# Patient Record
Sex: Female | Born: 1950
Health system: Southern US, Community
[De-identification: ages and names within clinical notes are randomized; demographics above are authoritative.]

## PROBLEM LIST (undated history)

## (undated) DIAGNOSIS — E559 Vitamin D deficiency, unspecified: Secondary | ICD-10-CM

## (undated) DIAGNOSIS — Z87898 Personal history of other specified conditions: Secondary | ICD-10-CM

## (undated) DIAGNOSIS — E785 Hyperlipidemia, unspecified: Secondary | ICD-10-CM

## (undated) DIAGNOSIS — Z8669 Personal history of other diseases of the nervous system and sense organs: Secondary | ICD-10-CM

## (undated) DIAGNOSIS — M79609 Pain in unspecified limb: Secondary | ICD-10-CM

## (undated) DIAGNOSIS — G500 Trigeminal neuralgia: Secondary | ICD-10-CM

## (undated) DIAGNOSIS — F329 Major depressive disorder, single episode, unspecified: Secondary | ICD-10-CM

## (undated) DIAGNOSIS — F411 Generalized anxiety disorder: Secondary | ICD-10-CM

## (undated) DIAGNOSIS — J42 Unspecified chronic bronchitis: Secondary | ICD-10-CM

## (undated) DIAGNOSIS — J309 Allergic rhinitis, unspecified: Secondary | ICD-10-CM

## (undated) DIAGNOSIS — M531 Cervicobrachial syndrome: Secondary | ICD-10-CM

## (undated) DIAGNOSIS — K449 Diaphragmatic hernia without obstruction or gangrene: Secondary | ICD-10-CM

## (undated) DIAGNOSIS — R51 Headache: Secondary | ICD-10-CM

## (undated) DIAGNOSIS — M1990 Unspecified osteoarthritis, unspecified site: Secondary | ICD-10-CM

## (undated) DIAGNOSIS — L259 Unspecified contact dermatitis, unspecified cause: Secondary | ICD-10-CM

## (undated) DIAGNOSIS — G9050 Complex regional pain syndrome I, unspecified: Secondary | ICD-10-CM

## (undated) DIAGNOSIS — Z86718 Personal history of other venous thrombosis and embolism: Secondary | ICD-10-CM

## (undated) DIAGNOSIS — K219 Gastro-esophageal reflux disease without esophagitis: Secondary | ICD-10-CM

## (undated) DIAGNOSIS — M546 Pain in thoracic spine: Secondary | ICD-10-CM

## (undated) DIAGNOSIS — D126 Benign neoplasm of colon, unspecified: Secondary | ICD-10-CM

## (undated) DIAGNOSIS — J449 Chronic obstructive pulmonary disease, unspecified: Secondary | ICD-10-CM

## (undated) DIAGNOSIS — G508 Other disorders of trigeminal nerve: Secondary | ICD-10-CM

## (undated) DIAGNOSIS — J441 Chronic obstructive pulmonary disease with (acute) exacerbation: Secondary | ICD-10-CM

## (undated) DIAGNOSIS — IMO0001 Reserved for inherently not codable concepts without codable children: Secondary | ICD-10-CM

## (undated) HISTORY — DX: Headache: R51

## (undated) HISTORY — DX: Personal history of other venous thrombosis and embolism: Z86.718

## (undated) HISTORY — DX: Diaphragmatic hernia without obstruction or gangrene: K44.9

## (undated) HISTORY — DX: Hyperlipidemia, unspecified: E78.5

## (undated) HISTORY — DX: Vitamin D deficiency, unspecified: E55.9

## (undated) HISTORY — PX: EYE SURGERY: SHX253

## (undated) HISTORY — DX: Reserved for inherently not codable concepts without codable children: IMO0001

## (undated) HISTORY — DX: Complex regional pain syndrome I, unspecified: G90.50

## (undated) HISTORY — DX: Pain in unspecified limb: M79.609

## (undated) HISTORY — DX: Chronic obstructive pulmonary disease, unspecified: J44.9

## (undated) HISTORY — DX: Unspecified chronic bronchitis: J42

## (undated) HISTORY — DX: Trigeminal neuralgia: G50.0

## (undated) HISTORY — DX: Gastro-esophageal reflux disease without esophagitis: K21.9

## (undated) HISTORY — PX: OTHER SURGICAL HISTORY: SHX169

## (undated) HISTORY — PX: OCCIPITAL NERVE STIMULATOR INSERTION: SHX2095

## (undated) HISTORY — DX: Chronic obstructive pulmonary disease with (acute) exacerbation: J44.1

## (undated) HISTORY — DX: Pain in thoracic spine: M54.6

## (undated) HISTORY — DX: Personal history of other specified conditions: Z87.898

## (undated) HISTORY — DX: Personal history of other diseases of the nervous system and sense organs: Z86.69

## (undated) HISTORY — DX: Cervicobrachial syndrome: M53.1

## (undated) HISTORY — DX: Generalized anxiety disorder: F41.1

## (undated) HISTORY — DX: Benign neoplasm of colon, unspecified: D12.6

## (undated) HISTORY — DX: Unspecified contact dermatitis, unspecified cause: L25.9

## (undated) HISTORY — DX: Allergic rhinitis, unspecified: J30.9

## (undated) HISTORY — DX: Unspecified osteoarthritis, unspecified site: M19.90

## (undated) HISTORY — DX: Major depressive disorder, single episode, unspecified: F32.9

## (undated) HISTORY — DX: Other disorders of trigeminal nerve: G50.8

---
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
## 1985-07-26 HISTORY — PX: OTHER SURGICAL HISTORY: SHX169

## 1987-07-27 HISTORY — PX: OTHER SURGICAL HISTORY: SHX169

## 1988-07-26 HISTORY — PX: OTHER SURGICAL HISTORY: SHX169

## 1992-07-26 HISTORY — PX: OTHER SURGICAL HISTORY: SHX169

## 1996-07-26 HISTORY — PX: OTHER SURGICAL HISTORY: SHX169

## 1997-12-04 ENCOUNTER — Other Ambulatory Visit: Admission: RE | Admit: 1997-12-04 | Discharge: 1997-12-04 | Payer: Self-pay | Admitting: Gynecology

## 1998-06-16 ENCOUNTER — Ambulatory Visit (HOSPITAL_COMMUNITY): Admission: RE | Admit: 1998-06-16 | Discharge: 1998-06-16 | Payer: Self-pay | Admitting: Orthopedic Surgery

## 1998-12-18 ENCOUNTER — Encounter: Admission: RE | Admit: 1998-12-18 | Discharge: 1999-03-18 | Payer: Self-pay | Admitting: Anesthesiology

## 1999-09-22 ENCOUNTER — Emergency Department (HOSPITAL_COMMUNITY): Admission: EM | Admit: 1999-09-22 | Discharge: 1999-09-22 | Payer: Self-pay | Admitting: *Deleted

## 2000-07-26 HISTORY — PX: OTHER SURGICAL HISTORY: SHX169

## 2001-11-27 ENCOUNTER — Other Ambulatory Visit: Admission: RE | Admit: 2001-11-27 | Discharge: 2001-11-27 | Payer: Self-pay | Admitting: Gynecology

## 2002-07-26 HISTORY — PX: OTHER SURGICAL HISTORY: SHX169

## 2002-08-03 ENCOUNTER — Ambulatory Visit (HOSPITAL_COMMUNITY): Admission: RE | Admit: 2002-08-03 | Discharge: 2002-08-03 | Payer: Self-pay | Admitting: Family Medicine

## 2002-08-03 ENCOUNTER — Encounter: Payer: Self-pay | Admitting: Family Medicine

## 2002-09-11 ENCOUNTER — Ambulatory Visit (HOSPITAL_BASED_OUTPATIENT_CLINIC_OR_DEPARTMENT_OTHER): Admission: RE | Admit: 2002-09-11 | Discharge: 2002-09-11 | Payer: Self-pay | Admitting: Family Medicine

## 2002-12-07 ENCOUNTER — Ambulatory Visit (HOSPITAL_COMMUNITY): Admission: RE | Admit: 2002-12-07 | Discharge: 2002-12-07 | Payer: Self-pay | Admitting: Family Medicine

## 2002-12-07 ENCOUNTER — Encounter: Payer: Self-pay | Admitting: Family Medicine

## 2003-05-13 ENCOUNTER — Ambulatory Visit (HOSPITAL_COMMUNITY): Admission: RE | Admit: 2003-05-13 | Discharge: 2003-05-13 | Payer: Self-pay | Admitting: Family Medicine

## 2003-05-13 ENCOUNTER — Encounter: Payer: Self-pay | Admitting: Family Medicine

## 2003-11-15 ENCOUNTER — Ambulatory Visit (HOSPITAL_COMMUNITY): Admission: RE | Admit: 2003-11-15 | Discharge: 2003-11-15 | Payer: Self-pay | Admitting: Family Medicine

## 2006-05-29 ENCOUNTER — Emergency Department (HOSPITAL_COMMUNITY): Admission: EM | Admit: 2006-05-29 | Discharge: 2006-05-29 | Payer: Self-pay | Admitting: Family Medicine

## 2007-07-27 HISTORY — PX: CARPAL TUNNEL RELEASE: SHX101

## 2008-05-05 ENCOUNTER — Emergency Department (HOSPITAL_COMMUNITY): Admission: EM | Admit: 2008-05-05 | Discharge: 2008-05-05 | Payer: Self-pay | Admitting: Family Medicine

## 2008-06-10 ENCOUNTER — Other Ambulatory Visit: Admission: RE | Admit: 2008-06-10 | Discharge: 2008-06-10 | Payer: Self-pay | Admitting: Family Medicine

## 2008-06-11 ENCOUNTER — Encounter: Payer: Self-pay | Admitting: Family Medicine

## 2008-07-01 ENCOUNTER — Ambulatory Visit: Payer: Self-pay | Admitting: Internal Medicine

## 2008-07-01 DIAGNOSIS — G9050 Complex regional pain syndrome I, unspecified: Secondary | ICD-10-CM | POA: Insufficient documentation

## 2008-07-01 DIAGNOSIS — Z87898 Personal history of other specified conditions: Secondary | ICD-10-CM | POA: Insufficient documentation

## 2008-07-01 DIAGNOSIS — F3289 Other specified depressive episodes: Secondary | ICD-10-CM

## 2008-07-01 DIAGNOSIS — J309 Allergic rhinitis, unspecified: Secondary | ICD-10-CM | POA: Insufficient documentation

## 2008-07-01 DIAGNOSIS — M531 Cervicobrachial syndrome: Secondary | ICD-10-CM

## 2008-07-01 DIAGNOSIS — IMO0001 Reserved for inherently not codable concepts without codable children: Secondary | ICD-10-CM

## 2008-07-01 DIAGNOSIS — J42 Unspecified chronic bronchitis: Secondary | ICD-10-CM

## 2008-07-01 DIAGNOSIS — G508 Other disorders of trigeminal nerve: Secondary | ICD-10-CM

## 2008-07-01 DIAGNOSIS — F329 Major depressive disorder, single episode, unspecified: Secondary | ICD-10-CM

## 2008-07-01 DIAGNOSIS — F411 Generalized anxiety disorder: Secondary | ICD-10-CM | POA: Insufficient documentation

## 2008-07-01 DIAGNOSIS — M79609 Pain in unspecified limb: Secondary | ICD-10-CM | POA: Insufficient documentation

## 2008-07-01 DIAGNOSIS — Z8669 Personal history of other diseases of the nervous system and sense organs: Secondary | ICD-10-CM | POA: Insufficient documentation

## 2008-07-01 DIAGNOSIS — K219 Gastro-esophageal reflux disease without esophagitis: Secondary | ICD-10-CM | POA: Insufficient documentation

## 2008-07-01 DIAGNOSIS — M1990 Unspecified osteoarthritis, unspecified site: Secondary | ICD-10-CM

## 2008-07-01 DIAGNOSIS — Z86718 Personal history of other venous thrombosis and embolism: Secondary | ICD-10-CM

## 2008-07-01 DIAGNOSIS — K5900 Constipation, unspecified: Secondary | ICD-10-CM | POA: Insufficient documentation

## 2008-07-01 HISTORY — DX: Reserved for inherently not codable concepts without codable children: IMO0001

## 2008-07-01 HISTORY — DX: Personal history of other specified conditions: Z87.898

## 2008-07-01 HISTORY — DX: Complex regional pain syndrome I, unspecified: G90.50

## 2008-07-01 HISTORY — DX: Other specified depressive episodes: F32.89

## 2008-07-01 HISTORY — DX: Major depressive disorder, single episode, unspecified: F32.9

## 2008-07-01 HISTORY — DX: Unspecified chronic bronchitis: J42

## 2008-07-01 HISTORY — DX: Allergic rhinitis, unspecified: J30.9

## 2008-07-01 HISTORY — DX: Pain in unspecified limb: M79.609

## 2008-07-01 HISTORY — DX: Unspecified osteoarthritis, unspecified site: M19.90

## 2008-07-01 HISTORY — DX: Other disorders of trigeminal nerve: G50.8

## 2008-07-01 HISTORY — DX: Cervicobrachial syndrome: M53.1

## 2008-07-01 HISTORY — DX: Personal history of other diseases of the nervous system and sense organs: Z86.69

## 2008-07-01 HISTORY — DX: Gastro-esophageal reflux disease without esophagitis: K21.9

## 2008-07-01 HISTORY — DX: Generalized anxiety disorder: F41.1

## 2008-07-01 HISTORY — DX: Personal history of other venous thrombosis and embolism: Z86.718

## 2008-07-02 ENCOUNTER — Telehealth (INDEPENDENT_AMBULATORY_CARE_PROVIDER_SITE_OTHER): Payer: Self-pay | Admitting: Internal Medicine

## 2008-09-23 DIAGNOSIS — D126 Benign neoplasm of colon, unspecified: Secondary | ICD-10-CM

## 2008-09-23 HISTORY — DX: Benign neoplasm of colon, unspecified: D12.6

## 2008-10-13 ENCOUNTER — Emergency Department (HOSPITAL_COMMUNITY): Admission: EM | Admit: 2008-10-13 | Discharge: 2008-10-13 | Payer: Self-pay | Admitting: Family Medicine

## 2008-11-12 ENCOUNTER — Ambulatory Visit: Payer: Self-pay | Admitting: Pain Medicine

## 2008-11-18 ENCOUNTER — Ambulatory Visit: Payer: Self-pay | Admitting: Pain Medicine

## 2008-11-21 ENCOUNTER — Encounter: Admission: RE | Admit: 2008-11-21 | Discharge: 2008-11-21 | Payer: Self-pay | Admitting: Orthopedic Surgery

## 2008-11-25 ENCOUNTER — Encounter: Admission: RE | Admit: 2008-11-25 | Discharge: 2008-11-25 | Payer: Self-pay | Admitting: Orthopedic Surgery

## 2008-12-17 ENCOUNTER — Ambulatory Visit: Payer: Self-pay | Admitting: Pain Medicine

## 2008-12-25 ENCOUNTER — Ambulatory Visit: Payer: Self-pay | Admitting: Pain Medicine

## 2009-01-21 ENCOUNTER — Ambulatory Visit: Payer: Self-pay | Admitting: Pain Medicine

## 2009-01-29 ENCOUNTER — Ambulatory Visit: Payer: Self-pay | Admitting: Pain Medicine

## 2009-02-05 ENCOUNTER — Ambulatory Visit: Payer: Self-pay | Admitting: Pain Medicine

## 2009-05-19 ENCOUNTER — Encounter: Payer: Self-pay | Admitting: Family Medicine

## 2009-05-21 ENCOUNTER — Ambulatory Visit: Payer: Self-pay | Admitting: Family Medicine

## 2009-05-21 DIAGNOSIS — G500 Trigeminal neuralgia: Secondary | ICD-10-CM | POA: Insufficient documentation

## 2009-05-21 HISTORY — DX: Trigeminal neuralgia: G50.0

## 2009-05-26 ENCOUNTER — Telehealth: Payer: Self-pay | Admitting: Family Medicine

## 2009-05-28 ENCOUNTER — Encounter (INDEPENDENT_AMBULATORY_CARE_PROVIDER_SITE_OTHER): Payer: Self-pay | Admitting: *Deleted

## 2009-05-29 ENCOUNTER — Ambulatory Visit: Payer: Self-pay | Admitting: Family Medicine

## 2009-05-29 DIAGNOSIS — L259 Unspecified contact dermatitis, unspecified cause: Secondary | ICD-10-CM | POA: Insufficient documentation

## 2009-05-29 DIAGNOSIS — S139XXA Sprain of joints and ligaments of unspecified parts of neck, initial encounter: Secondary | ICD-10-CM

## 2009-05-29 HISTORY — DX: Unspecified contact dermatitis, unspecified cause: L25.9

## 2009-06-03 ENCOUNTER — Ambulatory Visit (HOSPITAL_COMMUNITY): Admission: RE | Admit: 2009-06-03 | Discharge: 2009-06-03 | Payer: Self-pay | Admitting: Family Medicine

## 2009-06-06 ENCOUNTER — Telehealth: Payer: Self-pay | Admitting: Family Medicine

## 2009-06-27 ENCOUNTER — Encounter (INDEPENDENT_AMBULATORY_CARE_PROVIDER_SITE_OTHER): Payer: Self-pay | Admitting: *Deleted

## 2009-07-15 ENCOUNTER — Emergency Department (HOSPITAL_COMMUNITY): Admission: EM | Admit: 2009-07-15 | Discharge: 2009-07-15 | Payer: Self-pay | Admitting: Emergency Medicine

## 2009-07-15 ENCOUNTER — Telehealth: Payer: Self-pay | Admitting: Family Medicine

## 2009-09-02 ENCOUNTER — Encounter: Payer: Self-pay | Admitting: Family Medicine

## 2009-11-02 ENCOUNTER — Emergency Department (HOSPITAL_COMMUNITY): Admission: EM | Admit: 2009-11-02 | Discharge: 2009-11-02 | Payer: Self-pay | Admitting: Emergency Medicine

## 2009-11-04 ENCOUNTER — Ambulatory Visit: Payer: Self-pay | Admitting: Family Medicine

## 2009-11-05 ENCOUNTER — Encounter: Payer: Self-pay | Admitting: Family Medicine

## 2009-11-20 ENCOUNTER — Encounter: Payer: Self-pay | Admitting: Family Medicine

## 2009-11-28 ENCOUNTER — Ambulatory Visit (HOSPITAL_COMMUNITY)
Admission: RE | Admit: 2009-11-28 | Discharge: 2009-11-28 | Payer: Self-pay | Admitting: Physical Medicine & Rehabilitation

## 2009-12-01 ENCOUNTER — Encounter: Payer: Self-pay | Admitting: Family Medicine

## 2009-12-25 ENCOUNTER — Ambulatory Visit: Payer: Self-pay | Admitting: Family Medicine

## 2009-12-25 DIAGNOSIS — R519 Headache, unspecified: Secondary | ICD-10-CM | POA: Insufficient documentation

## 2009-12-25 DIAGNOSIS — R51 Headache: Secondary | ICD-10-CM

## 2009-12-25 HISTORY — DX: Headache: R51

## 2010-01-01 ENCOUNTER — Telehealth: Payer: Self-pay | Admitting: Family Medicine

## 2010-01-07 ENCOUNTER — Telehealth: Payer: Self-pay | Admitting: Family Medicine

## 2010-01-14 ENCOUNTER — Telehealth: Payer: Self-pay | Admitting: Family Medicine

## 2010-02-19 ENCOUNTER — Encounter: Payer: Self-pay | Admitting: Family Medicine

## 2010-02-25 ENCOUNTER — Telehealth: Payer: Self-pay | Admitting: Family Medicine

## 2010-03-04 ENCOUNTER — Ambulatory Visit: Payer: Self-pay | Admitting: Family Medicine

## 2010-03-04 DIAGNOSIS — M546 Pain in thoracic spine: Secondary | ICD-10-CM | POA: Insufficient documentation

## 2010-03-04 HISTORY — DX: Pain in thoracic spine: M54.6

## 2010-03-24 ENCOUNTER — Ambulatory Visit: Payer: Self-pay | Admitting: Gynecology

## 2010-03-24 ENCOUNTER — Other Ambulatory Visit: Admission: RE | Admit: 2010-03-24 | Discharge: 2010-03-24 | Payer: Self-pay | Admitting: Gynecology

## 2010-03-31 ENCOUNTER — Ambulatory Visit: Payer: Self-pay | Admitting: Family Medicine

## 2010-03-31 DIAGNOSIS — E559 Vitamin D deficiency, unspecified: Secondary | ICD-10-CM

## 2010-03-31 HISTORY — DX: Vitamin D deficiency, unspecified: E55.9

## 2010-03-31 LAB — CONVERTED CEMR LAB
Blood in Urine, dipstick: NEGATIVE
Nitrite: NEGATIVE
Protein, U semiquant: NEGATIVE
Urobilinogen, UA: 0.2
WBC Urine, dipstick: NEGATIVE
pH: 5.5

## 2010-04-01 LAB — CONVERTED CEMR LAB
ALT: 25 units/L (ref 0–35)
AST: 27 units/L (ref 0–37)
BUN: 12 mg/dL (ref 6–23)
Basophils Relative: 1.9 % (ref 0.0–3.0)
Bilirubin, Direct: 0.1 mg/dL (ref 0.0–0.3)
Eosinophils Relative: 4 % (ref 0.0–5.0)
GFR calc non Af Amer: 96 mL/min (ref >60)
HCT: 36.1 % (ref 36.0–46.0)
HDL: 67.7 mg/dL (ref >39.00)
Lymphs Abs: 1.7 10*3/uL (ref 0.7–4.0)
Monocytes Relative: 8.3 % (ref 3.0–12.0)
Platelets: 272 10*3/uL (ref 150.0–400.0)
Potassium: 4.2 meq/L (ref 3.5–5.1)
RBC: 3.81 M/uL — ABNORMAL LOW (ref 3.87–5.11)
Sodium: 143 meq/L (ref 135–145)
TSH: 2.91 microintl units/mL (ref 0.35–5.50)
Total Bilirubin: 0.5 mg/dL (ref 0.3–1.2)
Total CHOL/HDL Ratio: 4
Total Protein: 6.7 g/dL (ref 6.0–8.3)
VLDL: 34.2 mg/dL (ref 0.0–40.0)
WBC: 6.7 10*3/uL (ref 4.5–10.5)

## 2010-05-19 ENCOUNTER — Ambulatory Visit: Payer: Self-pay | Admitting: Gynecology

## 2010-06-10 ENCOUNTER — Encounter: Payer: Self-pay | Admitting: Family Medicine

## 2010-08-07 ENCOUNTER — Ambulatory Visit
Admission: RE | Admit: 2010-08-07 | Discharge: 2010-08-07 | Payer: Self-pay | Source: Home / Self Care | Attending: Family Medicine | Admitting: Family Medicine

## 2010-08-07 DIAGNOSIS — J441 Chronic obstructive pulmonary disease with (acute) exacerbation: Secondary | ICD-10-CM

## 2010-08-07 HISTORY — DX: Chronic obstructive pulmonary disease with (acute) exacerbation: J44.1

## 2010-08-25 NOTE — Letter (Signed)
Summary: [facility]  [facility]   Imported By: Maryln Gottron 11/07/2009 14:00:11  _____________________________________________________________________  External Attachment:    Type:   Image     Comment:   External Document

## 2010-08-25 NOTE — Assessment & Plan Note (Signed)
Summary: fup er-bp issues//ccm   Vital Signs:  Patient profile:   60 year old female Weight:      218 pounds Temp:     98.8 degrees F oral BP sitting:   122 / 78  (left arm) Cuff size:   large  Vitals Entered By: Sid Falcon LPN (November 04, 2009 11:29 AM) CC: BP check   History of Present Illness: Patient has chronic daily complex headaches. Followed closely by specialist in [facility]. History of fibromyalgia and occipital neuralgia. Intense headache over the weekend. Went to emergency room and was concerned because of blood pressure reading there of 120/44. No orthostatic symptoms or dizziness. No history of hypertension.  Pt has several questions today regarding whether we might be able to assist her for severe headache flare-ups.  One regimen suggested by her neurologist is combination of Toradol and phenergan.  On multiple chronic medications. Has been on alprazolam for several years and needs refills.  Denies any signif depression sxs at this time.  Allergies: 1)  ! Ancef 2)  * Fentanyl 3)  Neurontin 4)  Tegretol 5)  Baclofen 6)  * Trileptal  Past History:  Past Medical History: Last updated: 05/21/2009 Allergic rhinitis Anxiety Depression DVT, hx of GERD Osteoarthritis fibromyalgia carpal tunnel trigeminal neuralgia occipital neuralgia Migraines RSD PMH reviewed for relevance  Review of Systems       The patient complains of headaches.  The patient denies anorexia, fever, weight loss, chest pain, syncope, dyspnea on exertion, prolonged cough, abdominal pain, melena, hematochezia, severe indigestion/heartburn, muscle weakness, and transient blindness.    Physical Exam  General:  Well-developed,well-nourished,in no acute distress; alert,appropriate and cooperative throughout examination Head:  Normocephalic and atraumatic without obvious abnormalities. No apparent alopecia or balding. Eyes:  pupils equal, pupils round, and pupils reactive to light.     Ears:  External ear exam shows no significant lesions or deformities.  Otoscopic examination reveals clear canals, tympanic membranes are intact bilaterally without bulging, retraction, inflammation or discharge. Hearing is grossly normal bilaterally. Mouth:  Oral mucosa and oropharynx without lesions or exudates.  Teeth in good repair. Neck:  No deformities, masses, or tenderness noted. Lungs:  Normal respiratory effort, chest expands symmetrically. Lungs are clear to auscultation, no crackles or wheezes. Heart:  normal rate and regular rhythm.   Extremities:  no pitting edema. Neurologic:  alert & oriented X3, cranial nerves II-XII intact, strength normal in all extremities, and gait normal.   Cervical Nodes:  No lymphadenopathy noted Psych:  normally interactive, good eye contact, not anxious appearing, and not depressed appearing.     Impression & Recommendations:  Problem # 1:  ANXIETY (ICD-300.00) Assessment Unchanged refilled alprazolam. Her updated medication list for this problem includes:    Alprazolam 0.5 Mg Tabs (Alprazolam) .Marland Kitchen... 1/2 to one tablet three times daily as needed.    Hydroxyzine Hcl 10 Mg Tabs (Hydroxyzine hcl) ..... One tab three times a day as needed  Problem # 2:  MIGRAINES, HX OF (ICD-V13.8)  Problem # 3:  NONSPECIFIC LOW BLOOD PRESSURE READING (ICD-796.3) Assessment: New Reassurance for recent low diastolic reading with normal systolic and pt asymptomatic.  Complete Medication List: 1)  Premarin 0.625 Mg Tabs (Estrogens conjugated) .Marland Kitchen.. 1 by mouth once daily 2)  Alprazolam 0.5 Mg Tabs (Alprazolam) .... 1/2 to one tablet three times daily as needed. 3)  Zanaflex 4 Mg Caps (Tizanidine hcl) .Marland Kitchen.. 1 by mouth at bedtime 4)  Skelaxin 800 Mg Tabs (Metaxalone) .Marland Kitchen.. 1 by mouth  three times a day 5)  Zyrtec Allergy 10 Mg Tabs (Cetirizine hcl) .Marland Kitchen.. 1 by mouth once daily 6)  Singulair 10 Mg Tabs (Montelukast sodium) .Marland Kitchen.. 1 by mouth at bedtime 7)  Frova 2.5 Mg Tabs  (Frovatriptan succinate) .... As needed 8)  Promethazine Hcl 25 Mg Tabs (Promethazine hcl) .... As needed 9)  Multivitamins Tabs (Multiple vitamin) .Marland Kitchen.. 1 by mouth once daily 10)  Calcium 600 1500 Mg Tabs (Calcium carbonate) .Marland Kitchen.. 1 pobid 11)  Aciphex 20 Mg Tbec (Rabeprazole sodium) .... Once daily 12)  Vitamin E 400 Unit Caps (Vitamin e) .... Once daily 13)  Metoclopramide Hcl 10 Mg Tabs (Metoclopramide hcl) .Marland Kitchen.. 1 tab three times a day as needed 14)  Hydroxyzine Hcl 10 Mg Tabs (Hydroxyzine hcl) .... One tab three times a day as needed 15)  Savella 25 Mg Tabs (Milnacipran hcl) .... Take one tablet daily 16)  Vitamin D 5000u  .... Take one tablet daily 17)  Elocon 0.1 % Lotn (Mometasone furoate) .... Apply to affected rash once daily as needed 18)  Diflucan 150 Mg Tabs (Fluconazole) .... One tab by mouth  Patient Instructions: 1)  Your blood pressure today is 122/74 which is normal. 2)  Follow up if you note any significant dizziness or any new neurologic symptoms. Prescriptions: ALPRAZOLAM 0.5 MG TABS (ALPRAZOLAM) 1/2 to one tablet three times daily as needed.  #270 x 0   Entered and Authorized by:   Evelena Peat MD   Signed by:   Evelena Peat MD on 11/04/2009   Method used:   Print then Give to Patient   RxID:   [id number]

## 2010-08-25 NOTE — Progress Notes (Signed)
Summary: refill thru mail order.[facility]  Phone Note Refill Request Message from:  Patient on [phone]  Refills Requested: Medication #1:  SINGULAIR 10 MG TABS 1 by mouth at bedtime   Supply Requested: 3 months  Medication #2:  ACIPHEX 20 MG TBEC once daily   Supply Requested: 3 months  Method Requested: Mail to Patient if possible. Pt uses mail order. Pls call pt when ready.  Initial call taken by: Lucy Antigua,  January 14, 2010 3:38 PM  Follow-up for Phone Call        Pt states she has to mail originals to the [place] with special forms she has to fill out.  Rx printed, pt informed we will mail to her home as she lives in [place] Follow-up by: Sid Falcon LPN,  January 14, 2010 4:00 PM    Prescriptions: SINGULAIR 10 MG TABS (MONTELUKAST SODIUM) 1 by mouth at bedtime  #90 x 3   Entered by:   Sid Falcon LPN   Authorized by:   Evelena Peat MD   Signed by:   Sid Falcon LPN on 09/81/1914   Method used:   Print then Give to Patient   RxID:   [id number] ACIPHEX 20 MG TBEC (RABEPRAZOLE SODIUM) once daily  #90 x 3   Entered by:   Sid Falcon LPN   Authorized by:   Evelena Peat MD   Signed by:   Sid Falcon LPN on 69/62/9528   Method used:   Print then Give to Patient   RxID:   [id number]

## 2010-08-25 NOTE — Op Note (Signed)
Summary: Bilateral S1 Epidural Steroid Injection/[facility]   Bilateral S1 Epidural Steroid Injection/[facility]   Imported By: Maryln Gottron 12/24/2009 15:44:39  _____________________________________________________________________  External Attachment:    Type:   Image     Comment:   External Document

## 2010-08-25 NOTE — Progress Notes (Signed)
Summary: Pt req med for yeast inf. Pls call in to [facility]  Phone Note Call from Patient Call back at [facility] Phone [phone]   Caller: Patient Summary of Call: Pt called and is req a med for yeast inf, due to antibiotics. [facility]  [phone].   Pls notify pt when this has been called in.  Initial call taken by: Lucy Antigua,  February 25, 2010 11:15 AM  Follow-up for Phone Call        fluconazole 150 mg by mouth times one dose. Follow-up by: Evelena Peat MD,  February 25, 2010 5:42 PM  Additional Follow-up for Phone Call Additional follow up Details #1::        Rx sent, pt informed Additional Follow-up by: Sid Falcon LPN,  February 25, 2010 5:44 PM    New/Updated Medications: FLUCONAZOLE 150 MG TABS (FLUCONAZOLE) one tab by mouth one time Prescriptions: FLUCONAZOLE 150 MG TABS (FLUCONAZOLE) one tab by mouth one time  #1 x 0   Entered by:   Sid Falcon LPN   Authorized by:   Evelena Peat MD   Signed by:   Sid Falcon LPN on 30/86/5784   Method used:   [facility]        [facility] [address] [phone]* (retail)       [address]       [place], [place]  [address]       Ph: [phone] or [phone]       Fax: [phone]   RxID:   [id number]

## 2010-08-25 NOTE — Letter (Signed)
Summary: [facility]   Imported By: Maryln Gottron 01/08/2010 13:20:53  _____________________________________________________________________  External Attachment:    Type:   Image     Comment:   External Document

## 2010-08-25 NOTE — Assessment & Plan Note (Signed)
Summary: pt had dental extraction/facial pain/njr   Vital Signs:  Patient profile:   60 year old female Temp:     98.8 degrees F oral BP sitting:   120 / 88  (left arm) Cuff size:   large  Vitals Entered By: Sid Falcon LPN (December 26, 6710 11:16 AM) CC: Upper molar extraction 6 days ago, facial pain, Headache   History of Present Illness: L facial pain.  5/27 had tooth extraction L upper molar in [place].  Following that had some progressive swelling and pain L face.  Started Amox by dentist 3 days ago.  Low grade fever off and on.  Pain is throbbing and severe at times.  Hx Trigeminal neuralgia R but this pain is diffenrent-more diffuse and different quality. Intermittent headaches and these are somewhat chronic.  Allergies: 1)  ! Ancef 2)  * Fentanyl 3)  Neurontin 4)  Tegretol 5)  Baclofen 6)  * Trileptal  Past History:  Past Medical History: Last updated: 05/21/2009 Allergic rhinitis Anxiety Depression DVT, hx of GERD Osteoarthritis fibromyalgia carpal tunnel trigeminal neuralgia occipital neuralgia Migraines RSD  Review of Systems       The patient complains of fever and headaches.  The patient denies anorexia and chest pain.    Physical Exam  General:  Well-developed,well-nourished,in no acute distress; alert,appropriate and cooperative throughout examination Head:  Normocephalic and atraumatic without obvious abnormalities. No apparent alopecia or balding. Ears:  External ear exam shows no significant lesions or deformities.  Otoscopic examination reveals clear canals, tympanic membranes are intact bilaterally without bulging, retraction, inflammation or discharge. Hearing is grossly normal bilaterally. Mouth:  Appears to be retained tooth fragment L upper gum.  Mild purulent drainage.  Gum is slightly erythematous. Neck:  No deformities, masses, or tenderness noted. Lungs:  Normal respiratory effort, chest expands symmetrically. Lungs are clear to  auscultation, no crackles or wheezes. Heart:  Normal rate and regular rhythm. S1 and S2 normal without gallop, murmur, click, rub or other extra sounds. Neurologic:  alert & oriented X3, cranial nerves II-XII intact, and strength normal in all extremities.   Skin:  no rashes.   Cervical Nodes:  No lymphadenopathy noted Psych:  normally interactive, good eye contact, not anxious appearing, and not depressed appearing.     Impression & Recommendations:  Problem # 1:  FACIAL PAIN (ICD-784.0) Assessment New pt appears to have retained tooth fragment in gum with surrounding edema and some purulent drainage.  Change from Amox to Clindamycin for better anaerobe coverage and discussed possible side effects.  Dental referral made. Her updated medication list for this problem includes:    Frova 2.5 Mg Tabs (Frovatriptan succinate) .Marland Kitchen... As needed  Orders: Dental Referral (Dentist)  Complete Medication List: 1)  Premarin 0.625 Mg Tabs (Estrogens conjugated) .Marland Kitchen.. 1 by mouth once daily 2)  Alprazolam 0.5 Mg Tabs (Alprazolam) .... 1/2 to one tablet three times daily as needed. 3)  Zanaflex 4 Mg Caps (Tizanidine hcl) .Marland Kitchen.. 1 by mouth at bedtime 4)  Skelaxin 800 Mg Tabs (Metaxalone) .Marland Kitchen.. 1 by mouth three times a day 5)  Zyrtec Allergy 10 Mg Tabs (Cetirizine hcl) .Marland Kitchen.. 1 by mouth once daily 6)  Singulair 10 Mg Tabs (Montelukast sodium) .Marland Kitchen.. 1 by mouth at bedtime 7)  Frova 2.5 Mg Tabs (Frovatriptan succinate) .... As needed 8)  Promethazine Hcl 25 Mg Tabs (Promethazine hcl) .... As needed 9)  Multivitamins Tabs (Multiple vitamin) .Marland Kitchen.. 1 by mouth once daily 10)  Calcium 600 1500  Mg Tabs (Calcium carbonate) .Marland Kitchen.. 1 pobid 11)  Aciphex 20 Mg Tbec (Rabeprazole sodium) .... Once daily 12)  Vitamin E 400 Unit Caps (Vitamin e) .... Once daily 13)  Metoclopramide Hcl 10 Mg Tabs (Metoclopramide hcl) .Marland Kitchen.. 1 tab three times a day as needed 14)  Hydroxyzine Hcl 10 Mg Tabs (Hydroxyzine hcl) .... One tab three times a  day as needed 15)  Savella 25 Mg Tabs (Milnacipran hcl) .... Take one tablet daily 16)  Vitamin D 5000u  .... Take one tablet daily 17)  Elocon 0.1 % Lotn (Mometasone furoate) .... Apply to affected rash once daily as needed 18)  Diflucan 150 Mg Tabs (Fluconazole) .... One tab by mouth 19)  Clindamycin Hcl 300 Mg Caps (Clindamycin hcl) .... 2 by mouth three times a day for 7 days  Patient Instructions: 1)  stop Amoxicillin. 2)  Start Clindamycin. 3)  We will set up dental referral. Prescriptions: CLINDAMYCIN HCL 300 MG CAPS (CLINDAMYCIN HCL) 2 by mouth three times a day for 7 days  #42 x 0   Entered and Authorized by:   Evelena Peat MD   Signed by:   Evelena Peat MD on 12/25/2009   Method used:   [facility]        [facility] [address] [phone][facility] (retail)       [address]       [place], [place]  [address]       Ph: [phone] or [phone]       Fax: [phone]   RxID:   [id number]

## 2010-08-25 NOTE — Op Note (Signed)
Summary: [facility]  [facility]   Imported By: Maryln Gottron 09/19/2009 12:53:09  _____________________________________________________________________  External Attachment:    Type:   Image     Comment:   External Document

## 2010-08-25 NOTE — Progress Notes (Signed)
Summary: PT REQ RETURN CALL  Phone Note Call from Patient   Caller: Patient Summary of Call: Pt called to speak with Dr Caryl Never (currently with pts)...Marland KitchenMarland KitchenPt adv that she went to the dentist (Dr. Lenora Boys) and was adv that her mouth is healing and they didn't prescribe any futher meds or advise of any further instructions ref to care... Pt adv that she is still hurting, pain around area of extraction into her neck and ear.... Pt wants to know what to do from here?  Pt will finish her abx (clindamycin?) this Friday and pt adv that she is still in a lot of pain and pt adv she tastes a purulent d/c from the area of the extraction... what to do?  Pt can be reached at [phone].  Initial call taken by: Debbra Riding,  January 07, 2010 4:48 PM  Follow-up for Phone Call        spoke  with pt.  She will get a second dental opinion. Follow-up by: Evelena Peat MD,  January 07, 2010 5:53 PM

## 2010-08-25 NOTE — Assessment & Plan Note (Signed)
Summary: cpx/no pap/pt will come in fasting/njr   Vital Signs:  Patient profile:   60 year old female Menstrual status:  hysterectomy Height:      63 inches Weight:      226 pounds Temp:     98.1 degrees F oral Pulse rate:   72 / minute Pulse rhythm:   regular Resp:     12 per minute BP sitting:   110 / 80  (left arm) Cuff size:   large  Vitals Entered By: Sid Falcon LPN (March 31, 2010 10:40 AM) CC: CPX, fasting for labs     Menstrual Status hysterectomy Last PAP Result normal   History of Present Illness: Here for CPE.  Sees gyn Dr Audie Box.  Hx TAH 1994 sec to fibroids. Mammograms and DEXA through GYN. Last tetanus unknown.  PVX 2009.  Continues to battle chronic back pain. On Premarin per GYN.  Hx one prior episode of DVT.  Pt also requesting evaluation and management of the following:  GERD stable on PPI and requesting refills.  No dysphagia.  Allergic rhinitis and has been on Singulair for some time and needs refills. Allergy symptoms stable.  Osteoarthritis.  Has seen some improvement in pain with Ultram and needs refills. Tolerating well with no side effects.  Reported hx of Vit D deficiency and requesting repeat levels today. Takes OTC supplement 2,000 International Units/day.  New problem of progressive lower extremity edema which is worse late in day. No increase in dyspnea and no orthopnea.  On Avalide.  No clear precipitating factors.  Clinical Review Panels:  Prevention   Last Mammogram:  normal (06/09/2008)   Last Pap Smear:  normal (06/09/2008)  Immunizations   Last Tetanus Booster:  Tdap (03/31/2010)   Last Flu Vaccine:  Fluvax 3+ (03/31/2010)   Last Pneumovax:  Historical (06/10/2008)   Allergies: 1)  ! Ancef 2)  * Fentanyl 3)  Neurontin 4)  Tegretol 5)  Baclofen 6)  * Trileptal  Past History:  Past Medical History: Last updated: 05/21/2009 Allergic rhinitis Anxiety Depression DVT, hx  of GERD Osteoarthritis fibromyalgia carpal tunnel trigeminal neuralgia occipital neuralgia Migraines RSD  Family History: Last updated: 07-13-2008 father-deceased-93-heart problems, prostate cancer mother-deceased-55-ovarian cancer sister-57-chronic fatigue\par sisiter-50 brother-50 brother-54  Social History: Last updated: 05/21/2009 Disabled Married lives with spouse Current Smoker-1.5ppd x30 years Alcohol use-no Drug use-no  Husband has ALS  Risk Factors: Alcohol Use: 0 (03/04/2010) Caffeine Use: 0 (03/04/2010) Exercise: no (03/04/2010)  Risk Factors: Smoking Status: quit (03/04/2010)  Past Surgical History: Carpal tunnel release-right-2009 Hysterectomy-1994  TAH  fibroids Tonsillectomy-1957  right foot-bone spur-1987 left knee-1989 right knee-1990 right shoulder-1992, 8119,1478, 1999,  craniectomy--microvascular decompression for headaches--1994  Review of Systems       The patient complains of peripheral edema.  The patient denies anorexia, fever, weight loss, vision loss, hoarseness, chest pain, syncope, dyspnea on exertion, prolonged cough, hemoptysis, abdominal pain, melena, hematochezia, severe indigestion/heartburn, hematuria, incontinence, muscle weakness, suspicious skin lesions, transient blindness, depression, and enlarged lymph nodes.    Physical Exam  General:  Well-developed,well-nourished,in no acute distress; alert,appropriate and cooperative throughout examination Head:  Normocephalic and atraumatic without obvious abnormalities. No apparent alopecia or balding. Eyes:  pupils equal, pupils round, and pupils reactive to light.   Ears:  External ear exam shows no significant lesions or deformities.  Otoscopic examination reveals clear canals, tympanic membranes are intact bilaterally without bulging, retraction, inflammation or discharge. Hearing is grossly normal bilaterally. Mouth:  Oral mucosa and oropharynx without lesions or  exudates.   Teeth in good repair. Neck:  No deformities, masses, or tenderness noted. Lungs:  Normal respiratory effort, chest expands symmetrically. Lungs are clear to auscultation, no crackles or wheezes. Heart:  normal rate, regular rhythm, and no gallop.   Abdomen:  soft, non-tender, normal bowel sounds, no masses, no hepatomegaly, and no splenomegaly.   Extremities:  no pitting edema. Neurologic:  alert & oriented X3, cranial nerves II-XII intact, strength normal in all extremities, and gait normal.   Skin:  no rashes and no suspicious lesions.   Cervical Nodes:  No lymphadenopathy noted Psych:  normally interactive, good eye contact, not anxious appearing, and not depressed appearing.     Impression & Recommendations:  Problem # 1:  Preventive Health Care (ICD-V70.0) Flu vaccine recommended and given.  Tdap booster given. Work on weight loss.  Obtain screening labs.  Cont with GYN follow up.  Problem # 2:  UNSPECIFIED VITAMIN D DEFICIENCY (ICD-268.9)  Orders: T-Vitamin D (25-Hydroxy) (09811-91478) Venipuncture ([address]) Specimen Handling (13086)  Problem # 3:  OSTEOARTHRITIS (ICD-715.90) refill ultram Her updated medication list for this problem includes:    Tramadol Hcl 50 Mg Tabs (Tramadol hcl) .Marland Kitchen... 1-2 by mouth q 6 hours as needed pain  Problem # 4:  GERD (ICD-530.81) refill Aciphex. Her updated medication list for this problem includes:    Aciphex 20 Mg Tbec (Rabeprazole sodium) ..... Once daily  Problem # 5:  ALLERGIC RHINITIS (ICD-477.9) refilled singulair Her updated medication list for this problem includes:    Zyrtec Allergy 10 Mg Tabs (Cetirizine hcl) .Marland Kitchen... 1 by mouth once daily    Promethazine Hcl 25 Mg Tabs (Promethazine hcl) .Marland Kitchen... As needed  Problem # 6:  ANXIETY (ICD-300.00) long term use of Xanax at low dose.  REfilled. Her updated medication list for this problem includes:    Alprazolam 0.5 Mg Tabs (Alprazolam) .Marland Kitchen... 1/2 to one tablet three times daily as  needed.    Hydroxyzine Hcl 10 Mg Tabs (Hydroxyzine hcl) ..... One tab three times a day as needed  Problem # 7:  LEG EDEMA (ICD-782.3) Assessment: New suspect venous stasis edema.  Check labs and script for support hose given. Her updated medication list for this problem includes:    Furosemide 20 Mg Tabs (Furosemide) ..... Once daily as needed  Complete Medication List: 1)  Premarin 0.625 Mg Tabs (Estrogens conjugated) .Marland Kitchen.. 1 by mouth once daily 2)  Alprazolam 0.5 Mg Tabs (Alprazolam) .... 1/2 to one tablet three times daily as needed. 3)  Zanaflex 4 Mg Caps (Tizanidine hcl) .Marland Kitchen.. 1 by mouth at bedtime 4)  Skelaxin 800 Mg Tabs (Metaxalone) .Marland Kitchen.. 1 by mouth three times a day 5)  Zyrtec Allergy 10 Mg Tabs (Cetirizine hcl) .Marland Kitchen.. 1 by mouth once daily 6)  Singulair 10 Mg Tabs (Montelukast sodium) .Marland Kitchen.. 1 by mouth at bedtime 7)  Frova 2.5 Mg Tabs (Frovatriptan succinate) .... As needed 8)  Promethazine Hcl 25 Mg Tabs (Promethazine hcl) .... As needed 9)  Multivitamins Tabs (Multiple vitamin) .Marland Kitchen.. 1 by mouth once daily 10)  Calcium 600 1500 Mg Tabs (Calcium carbonate) .Marland Kitchen.. 1 pobid 11)  Aciphex 20 Mg Tbec (Rabeprazole sodium) .... Once daily 12)  Vitamin E 400 Unit Caps (Vitamin e) .... Once daily 13)  Metoclopramide Hcl 10 Mg Tabs (Metoclopramide hcl) .Marland Kitchen.. 1 tab three times a day as needed 14)  Hydroxyzine Hcl 10 Mg Tabs (Hydroxyzine hcl) .... One tab three times a day as needed 15)  Vitamin D 5000u  .Marland KitchenMarland KitchenMarland Kitchen  Take one tablet daily 16)  Elocon 0.1 % Lotn (Mometasone furoate) .... Apply to affected rash once daily as needed 17)  Cinnamon 500 Mg Caps (Cinnamon) 18)  Tramadol Hcl 50 Mg Tabs (Tramadol hcl) .Marland Kitchen.. 1-2 by mouth q 6 hours as needed pain 19)  Fluconazole 150 Mg Tabs (Fluconazole) .... One by mouth times one dose. 20)  Furosemide 20 Mg Tabs (Furosemide) .... Once daily as needed  Other Orders: UA Dipstick w/o Micro (automated)  (81003) Tdap => 64yrs IM ([address]) Admin 1st Vaccine (60454) Flu  Vaccine 88yrs + ([address]11) TLB-Lipid Panel (80061-LIPID) TLB-BMP (Basic Metabolic Panel-BMET) (80048-METABOL) TLB-CBC Platelet - w/Differential (85025-CBCD) TLB-Hepatic/Liver Function Pnl (80076-HEPATIC) TLB-TSH (Thyroid Stimulating Hormone) (84443-TSH)  Patient Instructions: 1)  Use venous compression stockings 2)  Elevate legs frequently. Prescriptions: ALPRAZOLAM 0.5 MG TABS (ALPRAZOLAM) 1/2 to one tablet three times daily as needed.  #270 x 1   Entered and Authorized by:   Evelena Peat MD   Signed by:   Evelena Peat MD on 03/31/2010   Method used:   Print then Give to Patient   RxID:   [id number] ACIPHEX 20 MG TBEC (RABEPRAZOLE SODIUM) once daily  #90 x 3   Entered and Authorized by:   Evelena Peat MD   Signed by:   Evelena Peat MD on 03/31/2010   Method used:   Print then Give to Patient   RxID:   [id number] SINGULAIR 10 MG TABS (MONTELUKAST SODIUM) 1 by mouth at bedtime  #90 x 3   Entered and Authorized by:   Evelena Peat MD   Signed by:   Evelena Peat MD on 03/31/2010   Method used:   Print then Give to Patient   RxID:   [id number] TRAMADOL HCL 50 MG TABS (TRAMADOL HCL) 1-2 by mouth q 6 hours as needed pain  #360 x 1   Entered and Authorized by:   Evelena Peat MD   Signed by:   Evelena Peat MD on 03/31/2010   Method used:   Print then Give to Patient   RxID:   [id number]    Immunizations Administered:  Tetanus Vaccine:    Vaccine Type: Tdap    Site: left deltoid    Mfr: GlaxoSmithKline    Dose: 0.5 ml    Route: IM    Given by: Sid Falcon LPN    Exp. Date: 04/15/2012    Lot #: [id number]     Flu Vaccine Consent Questions     Do you have a history of severe allergic reactions to this vaccine? no    Any prior history of allergic reactions to egg and/or gelatin? no    Do you have a sensitivity to the preservative Thimersol? no    Do you have a past history of Guillan-Barre Syndrome? no    Do you currently have an  acute febrile illness? no    Have you ever had a severe reaction to latex? no    Vaccine information given and explained to patient? yes    Are you currently pregnant? no    Lot Number:[id number]   Exp Date:01/23/2011   Site Given  Left Deltoid IM   Laboratory Results   Urine Tests    Routine Urinalysis   Color: yellow Appearance: Clear Glucose: negative   (Normal Range: Negative) Bilirubin: negative   (Normal Range: Negative) Ketone: negative   (Normal Range: Negative) Spec. Gravity: 1.025   (Normal Range: 1.003-1.035) Blood: negative   (Normal Range: Negative) pH: 5.5   (Normal Range:  5.0-8.0) Protein: negative   (Normal Range: Negative) Urobilinogen: 0.2   (Normal Range: 0-1) Nitrite: negative   (Normal Range: Negative) Leukocyte Esterace: negative   (Normal Range: Negative)    Comments: Rita Ohara  March 31, 2010 2:19 PM

## 2010-08-25 NOTE — Assessment & Plan Note (Signed)
Summary: back pain/njr   Vital Signs:  Patient profile:   60 year old female Height:      63 inches Weight:      221 pounds BMI:     39.29 Temp:     98.0 degrees F oral Pulse rate:   78 / minute BP sitting:   120 / 80  (left arm) Cuff size:   large  Vitals Entered By: Romualdo Bolk, CMA (AAMA) (March 04, 2010 2:40 PM) CC: Mid back pain x 10 days and still has a ongoing yeast infection   History of Present Illness: Patient is seen with new issue of midthoracic back pain. Started about 10 days after reaching overhead. She has history of chronic fibromyalgia pain as well as chronic cervical and lumbar pain. She sees a pain management specialist in [place].  Her pain as a deep achy pain radiates somewhat more to the right than left side but is fairly diffuse. No radiculopathy symptoms. She's tried heat and ice with minimal relief.  Already takes 2 different muscle relaxers without improvement. Cannot tolerate nonsteroidals.  Preventive Screening-Counseling & Management  Alcohol-Tobacco     Alcohol drinks/day: 0     Smoking Status: quit  Caffeine-Diet-Exercise     Caffeine use/day: 0     Does Patient Exercise: no  Current Medications (verified): 1)  Premarin 0.625 Mg Tabs (Estrogens Conjugated) .Marland Kitchen.. 1 By Mouth Once Daily 2)  Alprazolam 0.5 Mg Tabs (Alprazolam) .... 1/2 To One Tablet Three Times Daily As Needed. 3)  Zanaflex 4 Mg Caps (Tizanidine Hcl) .Marland Kitchen.. 1 By Mouth At Bedtime 4)  Skelaxin 800 Mg Tabs (Metaxalone) .Marland Kitchen.. 1 By Mouth Three Times A Day 5)  Zyrtec Allergy 10 Mg Tabs (Cetirizine Hcl) .Marland Kitchen.. 1 By Mouth Once Daily 6)  Singulair 10 Mg Tabs (Montelukast Sodium) .Marland Kitchen.. 1 By Mouth At Bedtime 7)  Frova 2.5 Mg Tabs (Frovatriptan Succinate) .... As Needed 8)  Promethazine Hcl 25 Mg Tabs (Promethazine Hcl) .... As Needed 9)  Multivitamins  Tabs (Multiple Vitamin) .Marland Kitchen.. 1 By Mouth Once Daily 10)  Calcium 600 1500 Mg Tabs (Calcium Carbonate) .Marland Kitchen.. 1 Pobid 11)  Aciphex 20 Mg  Tbec (Rabeprazole Sodium) .... Once Daily 12)  Vitamin E 400 Unit Caps (Vitamin E) .... Once Daily 13)  Metoclopramide Hcl 10 Mg Tabs (Metoclopramide Hcl) .Marland Kitchen.. 1 Tab Three Times A Day As Needed 14)  Hydroxyzine Hcl 10 Mg Tabs (Hydroxyzine Hcl) .... One Tab Three Times A Day As Needed 15)  Vitamin D 5000u .... Take One Tablet Daily 16)  Elocon 0.1 % Lotn (Mometasone Furoate) .... Apply To Affected Rash Once Daily As Needed 17)  Cinnamon 500 Mg Caps (Cinnamon)  Allergies (verified): 1)  ! Ancef 2)  * Fentanyl 3)  Neurontin 4)  Tegretol 5)  Baclofen 6)  * Trileptal  Past History:  Past Medical History: Last updated: 05/21/2009 Allergic rhinitis Anxiety Depression DVT, hx of GERD Osteoarthritis fibromyalgia carpal tunnel trigeminal neuralgia occipital neuralgia Migraines RSD PMH reviewed for relevance  Social History: Smoking Status:  quit Caffeine use/day:  0 Does Patient Exercise:  no  Review of Systems  The patient denies anorexia, fever, weight loss, chest pain, prolonged cough, and abdominal pain.    Physical Exam  General:  Well-developed,well-nourished,in no acute distress; alert,appropriate and cooperative throughout examination Neck:  No deformities, masses, or tenderness noted. Lungs:  Normal respiratory effort, chest expands symmetrically. Lungs are clear to auscultation, no crackles or wheezes. Heart:  Normal rate and regular  rhythm. S1 and S2 normal without gallop, murmur, click, rub or other extra sounds. Msk:  patient has some muscular tenderness midthoracic region bilaterally. No spinal tenderness. Extremities:  nonpitting edema lower legs bilaterally Neurologic:  full strength plantar flexion dorsiflexion bilaterally. Normal suture function at touch.   Impression & Recommendations:  Problem # 1:  BACK PAIN, THORACIC REGION (ICD-724.1)  suspect muscular pain.  Trial of Tramadol and PT if no better in 2 weeks. Her updated medication list for this  problem includes:    Zanaflex 4 Mg Caps (Tizanidine hcl) .Marland Kitchen... 1 by mouth at bedtime    Skelaxin 800 Mg Tabs (Metaxalone) .Marland Kitchen... 1 by mouth three times a day    Tramadol Hcl 50 Mg Tabs (Tramadol hcl) .Marland Kitchen... 1-2 by mouth q 6 hours as needed pain  Orders: Prescription Created Electronically [phone])  Complete Medication List: 1)  Premarin 0.625 Mg Tabs (Estrogens conjugated) .Marland Kitchen.. 1 by mouth once daily 2)  Alprazolam 0.5 Mg Tabs (Alprazolam) .... 1/2 to one tablet three times daily as needed. 3)  Zanaflex 4 Mg Caps (Tizanidine hcl) .Marland Kitchen.. 1 by mouth at bedtime 4)  Skelaxin 800 Mg Tabs (Metaxalone) .Marland Kitchen.. 1 by mouth three times a day 5)  Zyrtec Allergy 10 Mg Tabs (Cetirizine hcl) .Marland Kitchen.. 1 by mouth once daily 6)  Singulair 10 Mg Tabs (Montelukast sodium) .Marland Kitchen.. 1 by mouth at bedtime 7)  Frova 2.5 Mg Tabs (Frovatriptan succinate) .... As needed 8)  Promethazine Hcl 25 Mg Tabs (Promethazine hcl) .... As needed 9)  Multivitamins Tabs (Multiple vitamin) .Marland Kitchen.. 1 by mouth once daily 10)  Calcium 600 1500 Mg Tabs (Calcium carbonate) .Marland Kitchen.. 1 pobid 11)  Aciphex 20 Mg Tbec (Rabeprazole sodium) .... Once daily 12)  Vitamin E 400 Unit Caps (Vitamin e) .... Once daily 13)  Metoclopramide Hcl 10 Mg Tabs (Metoclopramide hcl) .Marland Kitchen.. 1 tab three times a day as needed 14)  Hydroxyzine Hcl 10 Mg Tabs (Hydroxyzine hcl) .... One tab three times a day as needed 15)  Vitamin D 5000u  .... Take one tablet daily 16)  Elocon 0.1 % Lotn (Mometasone furoate) .... Apply to affected rash once daily as needed 17)  Cinnamon 500 Mg Caps (Cinnamon) 18)  Tramadol Hcl 50 Mg Tabs (Tramadol hcl) .Marland Kitchen.. 1-2 by mouth q 6 hours as needed pain 19)  Fluconazole 150 Mg Tabs (Fluconazole) .... One by mouth times one dose.  Patient Instructions: 1)  Continue ice or heat for symptomatic relief 2)  Touch base in 2 weeks if pain not improved Prescriptions: FLUCONAZOLE 150 MG TABS (FLUCONAZOLE) one by mouth times one dose.  #1 x 0   Entered and  Authorized by:   Evelena Peat MD   Signed by:   Evelena Peat MD on 03/04/2010   Method used:   [facility]        [facility] [address] [phone]* (retail)       [address]       [place], [place]  [address]       Ph: [phone] or [phone]       Fax: [phone]   RxID:   [id number] TRAMADOL HCL 50 MG TABS (TRAMADOL HCL) 1-2 by mouth q 6 hours as needed pain  #60 x 1   Entered and Authorized by:   Evelena Peat MD   Signed by:   Evelena Peat MD on 03/04/2010   Method used:   [facility]  [facility] [address] [phone]* (retail)       [address]       [place], [place]  [address]       Ph: [phone] or [phone]       Fax: [phone]   RxID:   [id number]

## 2010-08-25 NOTE — Letter (Signed)
Summary: [facility]  [facility]   Imported By: Maryln Gottron 06/16/2010 11:15:35  _____________________________________________________________________  External Attachment:    Type:   Image     Comment:   External Document

## 2010-08-25 NOTE — Op Note (Signed)
Summary: Epidural Steroid Injection with fluoroscopic guidance/[facility]  Epidural Steroid Injection with fluoroscopic guidance/[facility]   Imported By: Maryln Gottron 03/09/2010 14:39:25  _____________________________________________________________________  External Attachment:    Type:   Image     Comment:   External Document

## 2010-08-25 NOTE — Progress Notes (Signed)
Summary: Pt called and has finished antibiotics. Mouth still throbbing  Phone Note Call from Patient Call back at Home Phone [phone]   Caller: Patient Summary of Call: Pt called and said that she went to oral surgeon for abcessess.  Pt has finished Clindamycin this a.m, and her mouth is still throbbing, has drainage, and pt is having a hard time eating. Pt has tried Patent attorney, but has not gotten a response. Pt is wondering what to do. Pls call today.  Initial call taken by: Lucy Antigua,  January 01, 2010 3:16 PM  Follow-up for Phone Call        spoke wiith pt .  She has seen oral surgeon who agreed with continuing the clindamycin. No side effects thus far.  Pt running out of med today.  Will refill until she can contact oral surgeon.  She is again cautioned about possible diarrhea. Follow-up by: Evelena Peat MD,  January 01, 2010 6:26 PM    Prescriptions: CLINDAMYCIN HCL 300 MG CAPS (CLINDAMYCIN HCL) 2 by mouth three times a day for 7 days  #42 x 0   Entered and Authorized by:   Evelena Peat MD   Signed by:   Evelena Peat MD on 01/01/2010   Method used:   [facility]        [facility] [address] [phone]* (retail)       [address]       [place], [place]  [address]       Ph: [phone] or [phone]       Fax: [phone]   RxID:   [id number]

## 2010-08-26 ENCOUNTER — Telehealth: Payer: Self-pay | Admitting: Family Medicine

## 2010-08-26 NOTE — Telephone Encounter (Signed)
Schedule follow up for pt.

## 2010-08-26 NOTE — Telephone Encounter (Signed)
Pt called and said that she has taken all the Doxycycline that Dr. Caryl Never had prescribed on 08/07/10. Pt said that symptoms started to go away, but now have come back. Pt said that Dr Caryl Never wanted her to call and let him know if she wasn't doing any better. Pt pharmacy is [facility], or does pt need another ov?

## 2010-08-27 ENCOUNTER — Ambulatory Visit (INDEPENDENT_AMBULATORY_CARE_PROVIDER_SITE_OTHER): Payer: Medicare Other | Admitting: Family Medicine

## 2010-08-27 ENCOUNTER — Encounter: Payer: Self-pay | Admitting: Family Medicine

## 2010-08-27 VITALS — BP 120/80 | HR 72 | Temp 98.1°F | Ht 62.5 in | Wt 212.0 lb

## 2010-08-27 DIAGNOSIS — J209 Acute bronchitis, unspecified: Secondary | ICD-10-CM

## 2010-08-27 MED ORDER — AZITHROMYCIN 250 MG PO TABS: ORAL_TABLET | ORAL | Status: AC

## 2010-08-27 NOTE — Assessment & Plan Note (Signed)
Summary: chest congestion//ccm   Vital Signs:  Patient profile:   60 year old female Menstrual status:  hysterectomy Weight:      219 pounds Temp:     97.7 degrees F oral BP sitting:   120 / 88  (left arm) Cuff size:   large  Vitals Entered By: Sid Falcon LPN (August 07, 2010 11:05 AM)  History of Present Illness:       This is a 60 year old woman who presents with URI symptoms.  Onset of symptoms Jan 1.  Still smokes.  The patient complains of nasal congestion, clear nasal discharge, sore throat, and productive cough, but denies earache and sick contacts.  The patient denies fever, dyspnea, wheezing, vomiting, and diarrhea.  The patient also reports headache and muscle aches.  The patient denies itchy throat, sneezing, and seasonal symptoms.    Allergies: 1)  ! Ancef 2)  * Fentanyl 3)  Neurontin 4)  Tegretol 5)  Baclofen 6)  * Trileptal  Past History:  Past Medical History: Last updated: 05/21/2009 Allergic rhinitis Anxiety Depression DVT, hx of GERD Osteoarthritis fibromyalgia carpal tunnel trigeminal neuralgia occipital neuralgia Migraines RSD  Review of Systems      See HPI  Physical Exam  General:  Well-developed,well-nourished,in no acute distress; alert,appropriate and cooperative throughout examination Ears:  External ear exam shows no significant lesions or deformities.  Otoscopic examination reveals clear canals, tympanic membranes are intact bilaterally without bulging, retraction, inflammation or discharge. Hearing is grossly normal bilaterally. Mouth:  Oral mucosa and oropharynx without lesions or exudates.  Teeth in good repair. Neck:  No deformities, masses, or tenderness noted. Lungs:  Normal respiratory effort, chest expands symmetrically. Lungs are clear to auscultation, no crackles or wheezes. Heart:  normal rate and regular rhythm.     Impression & Recommendations:  Problem # 1:  CHRONIC OBSTRUCTIVE PULMONARY DISEASE, ACUTE  EXACERBATION (ICD-491.21) Doxycycline.  Discussed smoking cessation.  Complete Medication List: 1)  Premarin 0.625 Mg Tabs (Estrogens conjugated) .Marland Kitchen.. 1 by mouth once daily 2)  Alprazolam 0.5 Mg Tabs (Alprazolam) .... 1/2 to one tablet three times daily as needed. 3)  Zanaflex 4 Mg Caps (Tizanidine hcl) .Marland Kitchen.. 1 by mouth at bedtime 4)  Skelaxin 800 Mg Tabs (Metaxalone) .Marland Kitchen.. 1 by mouth three times a day 5)  Zyrtec Allergy 10 Mg Tabs (Cetirizine hcl) .Marland Kitchen.. 1 by mouth once daily 6)  Singulair 10 Mg Tabs (Montelukast sodium) .Marland Kitchen.. 1 by mouth at bedtime 7)  Frova 2.5 Mg Tabs (Frovatriptan succinate) .... As needed 8)  Promethazine Hcl 25 Mg Tabs (Promethazine hcl) .... As needed 9)  Multivitamins Tabs (Multiple vitamin) .Marland Kitchen.. 1 by mouth once daily 10)  Calcium 600 1500 Mg Tabs (Calcium carbonate) .Marland Kitchen.. 1 pobid 11)  Aciphex 20 Mg Tbec (Rabeprazole sodium) .... Once daily 12)  Vitamin E 400 Unit Caps (Vitamin e) .... Once daily 13)  Metoclopramide Hcl 10 Mg Tabs (Metoclopramide hcl) .Marland Kitchen.. 1 tab three times a day as needed 14)  Hydroxyzine Hcl 10 Mg Tabs (Hydroxyzine hcl) .... One tab three times a day as needed 15)  Vitamin D 5000u  .... Take one tablet daily 16)  Elocon 0.1 % Lotn (Mometasone furoate) .... Apply to affected rash once daily as needed 17)  Cinnamon 500 Mg Caps (Cinnamon) 18)  Tramadol Hcl 50 Mg Tabs (Tramadol hcl) .Marland Kitchen.. 1-2 by mouth q 6 hours as needed pain 19)  Fluconazole 150 Mg Tabs (Fluconazole) .... One by mouth times one dose. 20)  Furosemide  20 Mg Tabs (Furosemide) .... Once daily as needed 21)  Butrans 10 Mcg/hr Ptwk (Buprenorphine) .... One patch weekly 22)  Doxycycline Hyclate 100 Mg Caps (Doxycycline hyclate) .... One by mouth two times a day for 10 days  Patient Instructions: 1)  Be in touch in 2 weeks if no better. 2)  Stop smoking tips: Choose a quit date. Cut down before the quit date. Decide what you will do as a substitute when you feel the urge to smoke(gum, toothpick,  exercise).  Prescriptions: DOXYCYCLINE HYCLATE 100 MG CAPS (DOXYCYCLINE HYCLATE) one by mouth two times a day for 10 days  #20 x 0   Entered and Authorized by:   Evelena Peat MD   Signed by:   Evelena Peat MD on 08/07/2010   Method used:   Electronically to        [facility] [address] [phone]* (retail)       [address]       [place], [place]  [address]       Ph: [phone] or [phone]       Fax: [phone]   RxID:   [id number]    Orders Added: 1)  Est. Patient Level III [03474]

## 2010-08-27 NOTE — Patient Instructions (Signed)
Acute Bronchitis You have acute bronchitis. This means you have a chest cold. The airways in your lungs are inflamed (red and sore). Acute means it is sudden onset. Bronchitis is most often caused by a virus. In smokers, people with chronic lung problems, and elderly patients, treatment with antibiotics for bacterial infection may be needed. Exposure to cigarette smoke or irritating chemicals will make bronchitis worse. Allergies and asthma can also make bronchitis worse. Repeated episodes of bronchitis may cause long standing lung problems. Acute bronchitis is usually treated with rest, fluids, and medicines for relief of fever or cough. Bronchodilator medicines from metered inhalers or a nebulizer may be used to help open up the small airways. This reduces shortness of breath and helps control cough. Antibiotics can be prescribed if you are more seriously ill or at risk. A cool air vaporizer may help thin bronchial secretions and make it easier to clear your chest. Increased fluids may also help. You must avoid smoking, even second hand exposure. If you are a cigarette smoker, consider using nicotine gum or skin patches to help control withdrawal symptoms. Recovery from bronchitis is often slow, but you should start feeling better after 2-3 days. Cough from bronchitis frequently lasts for 3-4 weeks.  SEEK IMMEDIATE MEDICAL CARE IF YOU DEVELOP:  Increased fever, chills, or chest pain.   Severe shortness of breath or bloody sputum.   Dehydration, fainting, repeated vomiting, severe headache.   No improvement after one week of proper treatment.  MAKE SURE YOU:   Understand these instructions.   Will watch your condition.   Will get help right away if you are not doing well or get worse.  Document Released: 08/19/2004 Document Re-Released: 06/24/2008 [employer] Patient Information 2011 [employer]. 

## 2010-08-27 NOTE — Progress Notes (Signed)
  Subjective:    Patient ID: Ashley Castaneda, female    DOB: Jun 07, 1951, 60 y.o.   MRN: [id number]  HPI Patient seen with chief complaint of productive cough. She has no history of COPD. Cough is mostly dry but sometimes productive of thick yellow sputum. Recently treated with doxycycline and felt better when on medication. Recurrence of symptoms above starting a few days ago. No fever. Some chronic dyspnea with exertion unchanged. No chest pains. No hemoptysis. She is still smoking but plans to stop in one week   Review of Systems    patient denies any headaches, fever, chills, or any change in dyspnea above baseline Objective:   Physical Exam Patient is alert in no distress. Oropharynx clear. TMs are normal. Chest reveals no wheezes or rales. Heart regular rhythm and rate Extremities no edema Skin no rash       Assessment & Plan:  History of chronic bronchitis with exacerbation Zithromax for 5 days. Suggest over-the-counter Mucinex

## 2010-10-09 ENCOUNTER — Encounter: Payer: Self-pay | Admitting: Family Medicine

## 2010-10-09 ENCOUNTER — Ambulatory Visit (INDEPENDENT_AMBULATORY_CARE_PROVIDER_SITE_OTHER): Payer: Medicare Other | Admitting: Family Medicine

## 2010-10-09 VITALS — BP 130/74 | Temp 98.4°F | Wt 215.0 lb

## 2010-10-09 DIAGNOSIS — R252 Cramp and spasm: Secondary | ICD-10-CM

## 2010-10-09 LAB — BASIC METABOLIC PANEL
BUN: 12 mg/dL (ref 6–23)
Chloride: 104 mEq/L (ref 96–112)
Glucose, Bld: 84 mg/dL (ref 70–99)
Potassium: 4.2 mEq/L (ref 3.5–5.1)

## 2010-10-09 NOTE — Progress Notes (Signed)
  Subjective:    Patient ID: Ashley Castaneda, female    DOB: 1950-08-24, 60 y.o.   MRN: [id number]  HPI  Patient seen with frequent leg cramps past 2 years off and on.  Occur more at night. Occasional involvement of thighs. Good fluid intake. No recent diarrhea. Also relates her legs frequently feel cold. Normal TSH last September. Arterial Dopplers normal May of 2010. No recent claudication symptoms. Takes furosemide. Electrolytes have been normal the past. No recent magnesium level. Takes multivitamin. Still smokes.   Review of Systems     Objective:   Physical Exam  patient alert and in no distress Neck supple with no mass Chest clear to auscultation Heart regular rate Extremities no edema. One plus dorsalis pedis pulses bilaterally. Both feet are warm to touch with good capillary refill       Assessment & Plan:   Leg cramps. Check basic metabolic panel and magnesium level. Continue multivitamin with B6. Drink plenty of fluids. If labs normal consider low-dose diltiazem 30 mg daily.

## 2010-10-09 NOTE — Patient Instructions (Signed)
Drink plenty of fluids. Make sure your multivitamin has B6. If labs normal, will consider addition of low dose Diltiazem.

## 2010-10-10 ENCOUNTER — Encounter: Payer: Self-pay | Admitting: Family Medicine

## 2010-10-12 NOTE — Progress Notes (Signed)
Quick Note:  Pt informed, pt is still having a lot of leg cramps and is interested in the medicine mentioned if her labs came back OK ______

## 2010-10-14 ENCOUNTER — Other Ambulatory Visit: Payer: Self-pay | Admitting: Family Medicine

## 2010-10-14 DIAGNOSIS — M79609 Pain in unspecified limb: Secondary | ICD-10-CM

## 2010-10-14 MED ORDER — DILTIAZEM HCL 30 MG PO TABS: 30.0000 mg | ORAL_TABLET | Freq: Every day | ORAL | Status: DC

## 2010-10-14 NOTE — Progress Notes (Signed)
Quick Note:  Pt informed and she voiced her understanding ______ 

## 2010-10-21 ENCOUNTER — Telehealth: Payer: Self-pay | Admitting: *Deleted

## 2010-10-21 NOTE — Telephone Encounter (Signed)
Pt took Diltiazem 30 mg. q hs and heart felt like it was fluttering, and has not helped her leg cramps either.  What should she do next???

## 2010-10-21 NOTE — Telephone Encounter (Signed)
Pt informed and she voiced her understanding 

## 2010-10-21 NOTE — Telephone Encounter (Signed)
Drink plenty of fluids.  Make sure she is on multivitamin with B6.  I am not sure she has given the Diltiazem an adequate trial but if she can't tolerate, we should d/c.  There aren't really any other proven treatments for leg cramps in a pt with normal electrolytes.

## 2010-10-26 LAB — CBC
Hemoglobin: 14.4 g/dL (ref 12.0–15.0)
RBC: 4.34 MIL/uL (ref 3.87–5.11)
RDW: 12.9 % (ref 11.5–15.5)

## 2010-10-26 LAB — DIFFERENTIAL
Basophils Absolute: 0.1 10*3/uL (ref 0.0–0.1)
Lymphocytes Relative: 20 % (ref 12–46)
Monocytes Relative: 8 % (ref 3–12)
Neutro Abs: 5.9 10*3/uL (ref 1.7–7.7)

## 2010-10-26 LAB — POCT CARDIAC MARKERS
CKMB, poc: <1 ng/mL — ABNORMAL LOW (ref 1.0–8.0)
Troponin i, poc: <0.05 ng/mL (ref 0.00–0.09)

## 2010-10-26 LAB — POCT I-STAT, CHEM 8
BUN: 7 mg/dL (ref 6–23)
Chloride: 107 mEq/L (ref 96–112)
Creatinine, Ser: 0.7 mg/dL (ref 0.4–1.2)
Glucose, Bld: 85 mg/dL (ref 70–99)
HCT: 43 % (ref 36.0–46.0)
Potassium: 4.1 mEq/L (ref 3.5–5.1)

## 2010-10-26 LAB — URINALYSIS, ROUTINE W REFLEX MICROSCOPIC
Bilirubin Urine: NEGATIVE
Hgb urine dipstick: NEGATIVE
Specific Gravity, Urine: 1.007 (ref 1.005–1.030)
pH: 7.5 (ref 5.0–8.0)

## 2011-02-05 ENCOUNTER — Encounter: Payer: Self-pay | Admitting: Internal Medicine

## 2011-02-05 ENCOUNTER — Ambulatory Visit (INDEPENDENT_AMBULATORY_CARE_PROVIDER_SITE_OTHER): Payer: Medicare Other | Admitting: Internal Medicine

## 2011-02-05 DIAGNOSIS — F329 Major depressive disorder, single episode, unspecified: Secondary | ICD-10-CM

## 2011-02-05 DIAGNOSIS — IMO0001 Reserved for inherently not codable concepts without codable children: Secondary | ICD-10-CM

## 2011-02-05 DIAGNOSIS — Z87898 Personal history of other specified conditions: Secondary | ICD-10-CM

## 2011-02-05 DIAGNOSIS — M531 Cervicobrachial syndrome: Secondary | ICD-10-CM

## 2011-02-05 MED ORDER — TRAMADOL HCL 50 MG PO TABS: 50.0000 mg | ORAL_TABLET | Freq: Four times a day (QID) | ORAL | Status: DC | PRN

## 2011-02-05 MED ORDER — FUROSEMIDE 20 MG PO TABS: 20.0000 mg | ORAL_TABLET | Freq: Every day | ORAL | Status: DC

## 2011-02-05 NOTE — Patient Instructions (Signed)
Followup pain management Continue Topamax titratration

## 2011-02-05 NOTE — Progress Notes (Signed)
  Subjective:    Patient ID: Ashley Castaneda, female    DOB: 05-15-1951, 60 y.o.   MRN: [id number]  HPI   60 year old patient who has a complex past medical history and is followed closely by pain management.  She complains of pain involving today primarily the left facial area. She was seen by pain management physicians yesterday and has been placed on Topamax. She has been treated with narcotics. She also requests a refill on her tramadol. She is treated depression and  fibromyalgia Review of Systems  Neurological: Positive for headaches.       Objective:   Physical Exam  Constitutional: She is oriented to person, place, and time. She appears well-developed and well-nourished. No distress.       Overweight No apparent distress Repeat blood pressure 124/78  HENT:  Head: Normocephalic.  Right Ear: External ear normal.  Left Ear: External ear normal.  Mouth/Throat: Oropharynx is clear and moist.       Left tympanic membrane and canal normal  Eyes: Conjunctivae and EOM are normal. Pupils are equal, round, and reactive to light.  Neck: Normal range of motion. Neck supple. No thyromegaly present.  Cardiovascular: Normal rate, regular rhythm, normal heart sounds and intact distal pulses.   Pulmonary/Chest: Effort normal and breath sounds normal.  Abdominal: Soft. Bowel sounds are normal. She exhibits no mass. There is no tenderness.  Musculoskeletal: Normal range of motion.  Lymphadenopathy:    She has no cervical adenopathy.  Neurological: She is alert and oriented to person, place, and time.  Skin: Skin is warm and dry. No rash noted.  Psychiatric: She has a normal mood and affect. Her behavior is normal.          Assessment & Plan:   Left hemifacial pain Fibromyalgia  We'll continue her Topamax titration Refill on her tramadol dispensed  Followup pain management as scheduled

## 2011-04-12 ENCOUNTER — Encounter: Payer: Medicare Other | Admitting: Family Medicine

## 2011-04-14 ENCOUNTER — Encounter: Payer: Self-pay | Admitting: Gynecology

## 2011-04-20 ENCOUNTER — Ambulatory Visit (INDEPENDENT_AMBULATORY_CARE_PROVIDER_SITE_OTHER): Payer: Medicare Other | Admitting: Gynecology

## 2011-04-20 ENCOUNTER — Encounter: Payer: Self-pay | Admitting: Gynecology

## 2011-04-20 VITALS — BP 122/78 | Ht 63.0 in | Wt 205.0 lb

## 2011-04-20 DIAGNOSIS — R51 Headache: Secondary | ICD-10-CM

## 2011-04-20 DIAGNOSIS — Z79890 Hormone replacement therapy (postmenopausal): Secondary | ICD-10-CM

## 2011-04-20 DIAGNOSIS — R32 Unspecified urinary incontinence: Secondary | ICD-10-CM

## 2011-04-20 NOTE — Progress Notes (Signed)
Ashley Castaneda [birth year] [id number]        60 y.o.  for followup. Recently saw Dr. Jacquelyne Balint in urology due to urinary incontinence. Patient notes this past year she started to have dribbling and loss of urine. He started her on a medication she's not sure the name of and notes that her symptoms seem to be persisting.  She is on ERT Premarin 0.625 still notes some hot flashes also her headaches continue. She was on 1.25 had no hot flushes and less headaches but her internist initially stopped it and then they started her on 0.625 but she continues to have some symptoms. She is status post LAVH BSO in 1994 for leiomyoma.  Past medical history,surgical history, medications, allergies, family history and social history were all reviewed and documented in the EPIC chart. ROS:  Was performed and pertinent positives and negatives are included in the history.  Exam: chaperone present Filed Vitals:   04/20/11 1129  BP: 122/78   General appearance  Normal Skin grossly normal Head/Neck normal with no cervical or supraclavicular adenopathy thyroid normal Lungs  clear Cardiac RR, without RMG Abdominal  soft, nontender, without masses, organomegaly or hernia Breasts  examined lying and sitting without masses, retractions, discharge or axillary adenopathy. Pelvic  Ext/BUS/vagina  normal atrophic changes, Pap not done  Adnexa  Without masses or tenderness    Anus and perineum  normal   Rectovaginal  normal sphincter tone without palpated masses or tenderness.    Assessment/Plan:  60 y.o. female for annual exam.    1. HRT. I again discussed the issues of HRT to include the WHI study, increased risk of stroke heart attack DVT increased risk of breast cancer. The ACOG and NAMS recommendation for the lowest dose for the shortest period of time reviewed. She feels much better on HRT has tried weaning has had hot flashes and worsening headaches. I discussed the issues of headaches and estrogen and the  potential for stroke and clots and apparently she feels better and has less headaches on HRT. She wanted to go back to the 1.25 I offered a trying the 0.9 dose of Premarin she wants to go ahead and do this and I wrote her a prescription for this #90 with one-year refill. Again she clearly understands the risks benefits predicted with her other issues and accepts this. 2. Urinary incontinence. Patient's exam shows some atrophic genital changes but otherwise no gross evidence of cystocele or other prolapse. She'll continue see Dr. Jacquelyne Balint to followup with her incontinence and any recommendations. 3. Health maintenance self breast exams on a monthly basis discussed and urged. She continues with her annual mammography and is up to date.  I did not do a Pap smear today. I discussed with her the current recommendations. She has no history of significant abnormal Paps in the past and has had annual normals documented. She is status post hysterectomy at this point we can stop doing Pap smears according to the current recommendations.  She had a bone density October 2011 which was normal we'll plan to repeat this at five-year interval. She reports having a colonoscopy last year. No blood work was done today as is all done through her primary falls her for her medical issues. Assuming she does well on her new dose of HRT that she'll see me in a year sooner as needed.    Dara Lords MD, 12:56 PM 04/20/2011

## 2011-04-20 NOTE — Patient Instructions (Signed)
Per our discussion.  Follow up in one year if doing well.

## 2011-04-30 ENCOUNTER — Encounter: Payer: Self-pay | Admitting: Family Medicine

## 2011-04-30 ENCOUNTER — Ambulatory Visit (INDEPENDENT_AMBULATORY_CARE_PROVIDER_SITE_OTHER): Payer: Medicare Other | Admitting: Family Medicine

## 2011-04-30 VITALS — BP 122/82 | HR 72 | Temp 98.0°F | Resp 12 | Ht 62.5 in | Wt 206.0 lb

## 2011-04-30 DIAGNOSIS — Z23 Encounter for immunization: Secondary | ICD-10-CM

## 2011-04-30 DIAGNOSIS — IMO0001 Reserved for inherently not codable concepts without codable children: Secondary | ICD-10-CM

## 2011-04-30 DIAGNOSIS — L989 Disorder of the skin and subcutaneous tissue, unspecified: Secondary | ICD-10-CM

## 2011-04-30 DIAGNOSIS — Z87898 Personal history of other specified conditions: Secondary | ICD-10-CM

## 2011-04-30 DIAGNOSIS — M2550 Pain in unspecified joint: Secondary | ICD-10-CM

## 2011-04-30 DIAGNOSIS — K219 Gastro-esophageal reflux disease without esophagitis: Secondary | ICD-10-CM

## 2011-04-30 DIAGNOSIS — J441 Chronic obstructive pulmonary disease with (acute) exacerbation: Secondary | ICD-10-CM

## 2011-04-30 DIAGNOSIS — Z13220 Encounter for screening for lipoid disorders: Secondary | ICD-10-CM

## 2011-04-30 DIAGNOSIS — F411 Generalized anxiety disorder: Secondary | ICD-10-CM

## 2011-04-30 DIAGNOSIS — Z0000 Encounter for general adult medical examination without abnormal findings: Secondary | ICD-10-CM

## 2011-04-30 DIAGNOSIS — R609 Edema, unspecified: Secondary | ICD-10-CM

## 2011-04-30 DIAGNOSIS — M1990 Unspecified osteoarthritis, unspecified site: Secondary | ICD-10-CM

## 2011-04-30 LAB — LIPID PANEL
Cholesterol: 230 mg/dL — ABNORMAL HIGH (ref 0–200)
HDL: 75.9 mg/dL (ref >39.00)
Triglycerides: 118 mg/dL (ref 0.0–149.0)

## 2011-04-30 LAB — CBC WITH DIFFERENTIAL/PLATELET
Basophils Relative: 0.8 % (ref 0.0–3.0)
Eosinophils Absolute: 0.2 10*3/uL (ref 0.0–0.7)
MCHC: 33 g/dL (ref 30.0–36.0)
MCV: 96.2 fl (ref 78.0–100.0)
Monocytes Absolute: 0.8 10*3/uL (ref 0.1–1.0)
Neutrophils Relative %: 64.2 % (ref 43.0–77.0)
Platelets: 344 10*3/uL (ref 150.0–400.0)

## 2011-04-30 LAB — LDL CHOLESTEROL, DIRECT: Direct LDL: 139.8 mg/dL

## 2011-04-30 LAB — HEPATIC FUNCTION PANEL
Albumin: 3.7 g/dL (ref 3.5–5.2)
Total Bilirubin: 0.7 mg/dL (ref 0.3–1.2)

## 2011-04-30 LAB — TSH: TSH: 1.74 u[IU]/mL (ref 0.35–5.50)

## 2011-04-30 LAB — BASIC METABOLIC PANEL
Calcium: 8.8 mg/dL (ref 8.4–10.5)
GFR: 82.69 mL/min (ref >60.00)
Sodium: 142 mEq/L (ref 135–145)

## 2011-04-30 MED ORDER — FUROSEMIDE 20 MG PO TABS: 20.0000 mg | ORAL_TABLET | Freq: Every day | ORAL | Status: DC

## 2011-04-30 MED ORDER — ALPRAZOLAM 0.5 MG PO TABS: ORAL_TABLET | ORAL | Status: DC

## 2011-04-30 MED ORDER — MONTELUKAST SODIUM 10 MG PO TABS: 10.0000 mg | ORAL_TABLET | Freq: Every day | ORAL | Status: DC

## 2011-04-30 MED ORDER — MOMETASONE FUROATE 0.1 % EX SOLN: Freq: Every day | CUTANEOUS | Status: DC

## 2011-04-30 MED ORDER — RABEPRAZOLE SODIUM 20 MG PO TBEC: 20.0000 mg | DELAYED_RELEASE_TABLET | Freq: Every day | ORAL | Status: DC

## 2011-04-30 NOTE — Patient Instructions (Signed)
Quit smoking. 

## 2011-04-30 NOTE — Progress Notes (Signed)
Subjective:    Patient ID: Ashley Castaneda, female    DOB: 04/02/1951, 60 y.o.   MRN: [id number]  HPI Patient is seen for complete physical and followup medical problems. She has somewhat complex past medical history. Sees multiple specialists. She has history of chronic anxiety, depression, history reflex sympathetic dystrophy, trigeminal neuralgia, GERD, ongoing nicotine use, osteoarthritis, occipital neuralgia with chronic headaches, fibromyalgia, and probable COPD.  Wellness issues addressed. Needs flu vaccine. Pneumovax and tetanus up-to-date. Colonoscopy around 2010. Recent mammogram normal. Sees gynecologist and still gets yearly Pap smears though she's had previous hysterectomy.  Ongoing nicotine use. 2 packs per day. History of low motivation to quit. No increase in cough or respiratory symptoms.  Complains of frequent arthralgias. Has seen local rheumatologist and is requesting second opinion. No history of known inflammatory arthritis. She continues to see pain management specialists in [place].  Frequent leg cramps. Previous labs have been normal. She is currently using over-the-counter herbal supplement which seems to help most. We've previously tried low dose diltiazem without relief.  Slightly pruritic left cheek region for one month. Has not tried anything topically. No history of skin cancer.  Recent problems with some urine incontinence. She is seeing urologist.  Past Medical History  Diagnosis Date  . Unspecified vitamin D deficiency 03/31/2010  . ANXIETY 07/01/2008  . DEPRESSION 07/01/2008  . REFLEX SYMPATHETIC DYSTROPHY 07/01/2008  . Trigeminal neuralgia 05/21/2009  . Other specified trigeminal nerve disorders 07/01/2008  . ALLERGIC RHINITIS 07/01/2008  . BRONCHITIS, CHRONIC 07/01/2008  . GERD 07/01/2008  . ECZEMA 05/29/2009  . OSTEOARTHRITIS 07/01/2008  . OCCIPITAL NEURALGIA 07/01/2008  . BACK PAIN, THORACIC REGION 03/04/2010  . FIBROMYALGIA 07/01/2008  . LEG PAIN,  BILATERAL 07/01/2008  . FACIAL PAIN 12/25/2009  . CARPAL TUNNEL SYNDROME, HX OF 07/01/2008  . DVT, HX OF 07/01/2008  . MIGRAINES, HX OF 07/01/2008  . CHRONIC OBSTRUCTIVE PULMONARY DISEASE, ACUTE EXACERBATION 08/07/2010   Past Surgical History  Procedure Date  . Lavh     BSO  . Tonsillectomy 1957  . Right foot bone spur 1987  . Left knee surgery 1989  . Right knee surgery 1990  . Right soulder surgery 1992, 1993, 1995, 1999  . Head craniectomy 1994    MICROVASULAR DECOMPRESSION  . Right foot surgery 1998    BAD CUT  . Right jaw surgery 2002  . Right thumb sugery 2004  . Carpal tunnel release 2009    RIGHT    reports that she has been smoking Cigarettes.  She has a 60 pack-year smoking history. She does not have any smokeless tobacco history on file. She reports that she does not drink alcohol or use illicit drugs. family history includes Cancer in her father; Hypertension in her father; Ovarian cancer in her mother; and Prostate cancer in her father. Allergies  Allergen Reactions  . Butrans (Buprenorphine Hcl) Itching  . Baclofen     REACTION: confusion  . Carbamazepine     REACTION: confusion  . Cefazolin     Ancef - REACTION: swelling  . Fentanyl     REACTION: nausea and vomiting  . Gabapentin     REACTION: confusion  . Oxcarbazepine     REACTION: confusion      Review of Systems  Constitutional: Positive for fatigue. Negative for fever, chills, activity change, appetite change and unexpected weight change.  HENT: Negative for sore throat, mouth sores, trouble swallowing, neck stiffness, voice change and sinus pressure.   Eyes: Negative for visual disturbance.  Respiratory: Negative for cough, shortness of breath and wheezing.   Cardiovascular: Negative for chest pain, palpitations and leg swelling.  Gastrointestinal: Positive for nausea, diarrhea and constipation. Negative for vomiting, abdominal pain, blood in stool, abdominal distention and rectal pain.    Genitourinary: Positive for urgency. Negative for dysuria, hematuria and vaginal discharge.  Musculoskeletal: Positive for myalgias, back pain and arthralgias. Negative for joint swelling.  Skin: Negative for rash.  Neurological: Positive for dizziness. Negative for seizures and syncope.  Hematological: Negative for adenopathy. Does not bruise/bleed easily.  Psychiatric/Behavioral: Negative for dysphoric mood.       Objective:   Physical Exam  Constitutional: She is oriented to person, place, and time. She appears well-developed and well-nourished.  HENT:  Right Ear: External ear normal.  Left Ear: External ear normal.  Mouth/Throat: Oropharynx is clear and moist.  Neck: Neck supple. No thyromegaly present.  Cardiovascular: Normal rate and regular rhythm.   Pulmonary/Chest: Effort normal and breath sounds normal. No respiratory distress. She has no wheezes. She has no rales.  Abdominal: Soft. She exhibits no distension and no mass. There is no tenderness. There is no rebound and no guarding.  Musculoskeletal: She exhibits no edema.  Lymphadenopathy:    She has no cervical adenopathy.  Neurological: She is alert and oriented to person, place, and time. No cranial nerve deficit.  Skin:       Left cheek reveals excoriated lesion which is about 2 mm diameter. No nodular quality. No ulceration.  Psychiatric: She has a normal mood and affect. Her behavior is normal.          Assessment & Plan:  #1 health maintenance. Recent GYN followup and mammogram as above. Colonoscopy up-to-date. Tetanus and Pneumovax up-to-date. Flu vaccine given. Discussed smoking cessation at some length. Motivation questionable. #2 nonspecific excoriated skin lesion left cheek. Try Elocon topically daily and avoid scratching. Follow up if not resolved in one month. #3 history of fibromyalgia. She has polyarthralgias and is requesting second rheumatology opinion. We'll try to set up with local rheumatologist  and on over  #4 frequent leg cramps. Continue adequate hydration and she'll continue over-the-counter supplement which seems to help.   #5 probable COPD. Patient declines spirometry at this point. Symptomatically stable  #6 GERD. Refill AcipHex #7 history of peripheral edema currently stable. Continue low-dose furosemide. Check electrolytes.

## 2011-05-04 NOTE — Progress Notes (Signed)
Quick Note:  Pt informed, copy mailed to pt home ______ 

## 2011-06-16 ENCOUNTER — Ambulatory Visit (INDEPENDENT_AMBULATORY_CARE_PROVIDER_SITE_OTHER): Payer: Medicare Other | Admitting: Family Medicine

## 2011-06-16 ENCOUNTER — Encounter: Payer: Self-pay | Admitting: Family Medicine

## 2011-06-16 VITALS — BP 130/88 | HR 101 | Temp 98.1°F | Wt 208.0 lb

## 2011-06-16 DIAGNOSIS — J40 Bronchitis, not specified as acute or chronic: Secondary | ICD-10-CM

## 2011-06-16 MED ORDER — AZITHROMYCIN 250 MG PO TABS: ORAL_TABLET | ORAL | Status: AC

## 2011-06-16 NOTE — Progress Notes (Signed)
  Subjective:    Patient ID: Ashley Castaneda, female    DOB: [birth year], 60 y.o.   MRN: [id number]  HPI Here for 2 weeks of chest congestion and coughing up yellow sputum. No fever. She went to Urgent Care on 06-07-11 and they gave her Ceclor. She took 7 days of this, and it did not help at all. In fact she reacted to it and had a rash all over her body. Since stopping it the rash has resolved.    Review of Systems  Constitutional: Negative.   HENT: Negative.   Eyes: Negative.   Respiratory: Positive for cough.        Objective:   Physical Exam  Constitutional: She appears well-developed and well-nourished.  HENT:  Right Ear: External ear normal.  Left Ear: External ear normal.  Nose: Nose normal.  Mouth/Throat: Oropharynx is clear and moist. No oropharyngeal exudate.  Eyes: Conjunctivae are normal.  Neck: No thyromegaly present.  Pulmonary/Chest: Effort normal.       Scattered rhonchi and wheezes  Lymphadenopathy:    She has no cervical adenopathy.          Assessment & Plan:  Add Mucinex

## 2011-07-30 ENCOUNTER — Encounter: Payer: Self-pay | Admitting: Family Medicine

## 2011-07-30 ENCOUNTER — Ambulatory Visit (INDEPENDENT_AMBULATORY_CARE_PROVIDER_SITE_OTHER): Payer: Medicare Other | Admitting: Family Medicine

## 2011-07-30 VITALS — BP 120/70 | Temp 98.3°F | Wt 206.0 lb

## 2011-07-30 DIAGNOSIS — F17200 Nicotine dependence, unspecified, uncomplicated: Secondary | ICD-10-CM | POA: Diagnosis not present

## 2011-07-30 DIAGNOSIS — F411 Generalized anxiety disorder: Secondary | ICD-10-CM

## 2011-07-30 MED ORDER — BUPROPION HCL ER (XL) 300 MG PO TB24: 300.0000 mg | ORAL_TABLET | Freq: Every day | ORAL | Status: DC

## 2011-07-30 NOTE — Progress Notes (Signed)
  Subjective:    Patient ID: Ashley Castaneda, female    DOB: [birth year], 61 y.o.   MRN: [id number]  HPI  Patient here to discuss smoking cessation. Currently smoking about 2 packs of cigarettes per day. She has increased motivation. She has not yet had picked a quit date. Has previously tried nicotine replacement without success.  Also tried Chantix but had bad dreams. She is specifically interested in trying Wellbutrin. She also has history of depression previously though currently stable. Ongoing problems with trigeminal neuralgia and chronic headaches. She is seen with pain specialist and neurologist.  No contraindications such as seizure.  Chronic anxiety and takes low dose Xanax.  Currently on low dose BID and inquiring about possible TID dosing.  Denies recent increase in depression symptoms.     Review of Systems  Respiratory: Negative for cough and shortness of breath.   Cardiovascular: Negative for chest pain.  Neurological: Positive for headaches. Negative for dizziness and seizures.       Objective:   Physical Exam  Constitutional: She appears well-developed and well-nourished. No distress.  Cardiovascular: Normal rate and regular rhythm.   Pulmonary/Chest: Effort normal and breath sounds normal. No respiratory distress. She has no wheezes. She has no rales.  Psychiatric: She has a normal mood and affect. Her behavior is normal.          Assessment & Plan:  Smoking cessation. Discussed multiple options. We elected to try Wellbutrin XL 300 mg daily. Reviewed possible side effects. Will start medication at least 2 weeks prior to quit date.  Chronic anxiety.  Have cautioned against escalation in xanax dose (mgs) but she will add one half tablet at mid day.

## 2011-07-30 NOTE — Patient Instructions (Signed)
Smoking Cessation This document explains the best ways for you to quit smoking and new treatments to help. It lists new medicines that can double or triple your chances of quitting and quitting for good. It also considers ways to avoid relapses and concerns you may have about quitting, including weight gain. NICOTINE: A POWERFUL ADDICTION If you have tried to quit smoking, you know how hard it can be. It is hard because nicotine is a very addictive drug. For some people, it can be as addictive as heroin or cocaine. Usually, people make 2 or 3 tries, or more, before finally being able to quit. Each time you try to quit, you can learn about what helps and what hurts. Quitting takes hard work and a lot of effort, but you can quit smoking. QUITTING SMOKING IS ONE OF THE MOST IMPORTANT THINGS YOU WILL EVER DO.  You will live longer, feel better, and live better.   The impact on your body of quitting smoking is felt almost immediately:   Within 20 minutes, blood pressure decreases. Pulse returns to its normal level.   After 8 hours, carbon monoxide levels in the blood return to normal. Oxygen level increases.   After 24 hours, chance of heart attack starts to decrease. Breath, hair, and body stop smelling like smoke.   After 48 hours, damaged nerve endings begin to recover. Sense of taste and smell improve.   After 72 hours, the body is virtually free of nicotine. Bronchial tubes relax and breathing becomes easier.   After 2 to 12 weeks, lungs can hold more air. Exercise becomes easier and circulation improves.   Quitting will reduce your risk of having a heart attack, stroke, cancer, or lung disease:   After 1 year, the risk of coronary heart disease is cut in half.   After 5 years, the risk of stroke falls to the same as a nonsmoker.   After 10 years, the risk of lung cancer is cut in half and the risk of other cancers decreases significantly.   After 15 years, the risk of coronary heart  disease drops, usually to the level of a nonsmoker.   If you are pregnant, quitting smoking will improve your chances of having a healthy baby.   The people you live with, especially your children, will be healthier.   You will have extra money to spend on things other than cigarettes.  FIVE KEYS TO QUITTING Studies have shown that these 5 steps will help you quit smoking and quit for good. You have the best chances of quitting if you use them together: 1. Get ready.  2. Get support and encouragement.  3. Learn new skills and behaviors.  4. Get medicine to reduce your nicotine addiction and use it correctly.  5. Be prepared for relapse or difficult situations. Be determined to continue trying to quit, even if you do not succeed at first.  1. GET READY  Set a quit date.   Change your environment.   Get rid of ALL cigarettes, ashtrays, matches, and lighters in your home, car, and place of work.   Do not let people smoke in your home.   Review your past attempts to quit. Think about what worked and what did not.   Once you quit, do not smoke. NOT EVEN A PUFF!  2. GET SUPPORT AND ENCOURAGEMENT Studies have shown that you have a better chance of being successful if you have help. You can get support in many ways.  Tell   your family, friends, and coworkers that you are going to quit and need their support. Ask them not to smoke around you.   Talk to your caregivers (doctor, dentist, nurse, pharmacist, psychologist, and/or smoking counselor).   Get individual, group, or telephone counseling and support. The more counseling you have, the better your chances are of quitting. Programs are available at local hospitals and health centers. Call your local health department for information about programs in your area.   Spiritual beliefs and practices may help some smokers quit.   Quit meters are small computer programs online or downloadable that keep track of quit statistics, such as amount  of "quit-time," cigarettes not smoked, and money saved.   Many smokers find one or more of the many self-help books available useful in helping them quit and stay off tobacco.  3. LEARN NEW SKILLS AND BEHAVIORS  Try to distract yourself from urges to smoke. Talk to someone, go for a walk, or occupy your time with a task.   When you first try to quit, change your routine. Take a different route to work. Drink tea instead of coffee. Eat breakfast in a different place.   Do something to reduce your stress. Take a hot bath, exercise, or read a book.   Plan something enjoyable to do every day. Reward yourself for not smoking.   Explore interactive web-based programs that specialize in helping you quit.  4. GET MEDICINE AND USE IT CORRECTLY Medicines can help you stop smoking and decrease the urge to smoke. Combining medicine with the above behavioral methods and support can quadruple your chances of successfully quitting smoking. The [place]. Food and Drug Administration (FDA) has approved 7 medicines to help you quit smoking. These medicines fall into 3 categories.  Nicotine replacement therapy (delivers nicotine to your body without the negative effects and risks of smoking):   Nicotine gum: Available over-the-counter.   Nicotine lozenges: Available over-the-counter.   Nicotine inhaler: Available by prescription.   Nicotine nasal spray: Available by prescription.   Nicotine skin patches (transdermal): Available by prescription and over-the-counter.   Antidepressant medicine (helps people abstain from smoking, but how this works is unknown):   Bupropion sustained-release (SR) tablets: Available by prescription.   Nicotinic receptor partial agonist (simulates the effect of nicotine in your brain):   Varenicline tartrate tablets: Available by prescription.   Ask your caregiver for advice about which medicines to use and how to use them. Carefully read the information on the package.    Everyone who is trying to quit may benefit from using a medicine. If you are pregnant or trying to become pregnant, nursing an infant, you are under age 18, or you smoke fewer than 10 cigarettes per day, talk to your caregiver before taking any nicotine replacement medicines.   You should stop using a nicotine replacement product and call your caregiver if you experience nausea, dizziness, weakness, vomiting, fast or irregular heartbeat, mouth problems with the lozenge or gum, or redness or swelling of the skin around the patch that does not go away.   Do not use any other product containing nicotine while using a nicotine replacement product.   Talk to your caregiver before using these products if you have diabetes, heart disease, asthma, stomach ulcers, you had a recent heart attack, you have high blood pressure that is not controlled with medicine, a history of irregular heartbeat, or you have been prescribed medicine to help you quit smoking.  5. BE PREPARED FOR RELAPSE OR   DIFFICULT SITUATIONS  Most relapses occur within the first 3 months after quitting. Do not be discouraged if you start smoking again. Remember, most people try several times before they finally quit.   You may have symptoms of withdrawal because your body is used to nicotine. You may crave cigarettes, be irritable, feel very hungry, cough often, get headaches, or have difficulty concentrating.   The withdrawal symptoms are only temporary. They are strongest when you first quit, but they will go away within 10 to 14 days.  Here are some difficult situations to watch for:  Alcohol. Avoid drinking alcohol. Drinking lowers your chances of successfully quitting.   Caffeine. Try to reduce the amount of caffeine you consume. It also lowers your chances of successfully quitting.   Other smokers. Being around smoking can make you want to smoke. Avoid smokers.   Weight gain. Many smokers will gain weight when they quit, usually  less than 10 pounds. Eat a healthy diet and stay active. Do not let weight gain distract you from your main goal, quitting smoking. Some medicines that help you quit smoking may also help delay weight gain. You can always lose the weight gained after you quit.   Bad mood or depression. There are a lot of ways to improve your mood other than smoking.  If you are having problems with any of these situations, talk to your caregiver. SPECIAL SITUATIONS AND CONDITIONS Studies suggest that everyone can quit smoking. Your situation or condition can give you a special reason to quit.  Pregnant women/new mothers: By quitting, you protect your baby's health and your own.   Hospitalized patients: By quitting, you reduce health problems and help healing.   Heart attack patients: By quitting, you reduce your risk of a second heart attack.   Lung, head, and neck cancer patients: By quitting, you reduce your chance of a second cancer.   Parents of children and adolescents: By quitting, you protect your children from illnesses caused by secondhand smoke.  QUESTIONS TO THINK ABOUT Think about the following questions before you try to stop smoking. You may want to talk about your answers with your caregiver.  Why do you want to quit?   If you tried to quit in the past, what helped and what did not?   What will be the most difficult situations for you after you quit? How will you plan to handle them?   Who can help you through the tough times? Your family? Friends? Caregiver?   What pleasures do you get from smoking? What ways can you still get pleasure if you quit?  Here are some questions to ask your caregiver:  How can you help me to be successful at quitting?   What medicine do you think would be best for me and how should I take it?   What should I do if I need more help?   What is smoking withdrawal like? How can I get information on withdrawal?  Quitting takes hard work and a lot of effort,  but you can quit smoking. FOR MORE INFORMATION  Smokefree.gov (http://www.smokefree.gov) provides free, accurate, evidence-based information and professional assistance to help support the immediate and long-term needs of people trying to quit smoking. Document Released: 07/06/2001 Document Revised: 03/24/2011 Document Reviewed: 04/28/2009 [employer] Patient Information 2012 [employer]. 

## 2011-08-03 DIAGNOSIS — G894 Chronic pain syndrome: Secondary | ICD-10-CM | POA: Diagnosis not present

## 2011-08-03 DIAGNOSIS — R51 Headache: Secondary | ICD-10-CM | POA: Diagnosis not present

## 2011-08-03 DIAGNOSIS — M545 Low back pain: Secondary | ICD-10-CM | POA: Diagnosis not present

## 2011-08-11 ENCOUNTER — Telehealth: Payer: Self-pay | Admitting: Family Medicine

## 2011-08-11 NOTE — Telephone Encounter (Signed)
Pt has head congestion/cough requesting something call into [facility]. Pt unable to come in due dizziness.

## 2011-08-11 NOTE — Telephone Encounter (Signed)
If no fever try Mucinex DM.

## 2011-08-12 NOTE — Telephone Encounter (Signed)
Pt returned call and has been advised to try Mucinex DM if not running a fever per Dr Caryl Never. Pt says that she already has been taking 2 Mucinex a day and it hasn't helped. Pt req a different med.

## 2011-08-12 NOTE — Telephone Encounter (Signed)
Left message for pt to call for an appt.

## 2011-08-12 NOTE — Telephone Encounter (Signed)
Needs to be seen

## 2011-08-12 NOTE — Telephone Encounter (Signed)
LMTCB

## 2011-08-13 ENCOUNTER — Encounter: Payer: Self-pay | Admitting: Family Medicine

## 2011-08-13 ENCOUNTER — Ambulatory Visit (INDEPENDENT_AMBULATORY_CARE_PROVIDER_SITE_OTHER): Payer: Medicare Other | Admitting: Family Medicine

## 2011-08-13 ENCOUNTER — Telehealth: Payer: Self-pay | Admitting: *Deleted

## 2011-08-13 VITALS — BP 118/78 | Temp 97.8°F | Wt 206.0 lb

## 2011-08-13 DIAGNOSIS — J40 Bronchitis, not specified as acute or chronic: Secondary | ICD-10-CM

## 2011-08-13 MED ORDER — LEVOFLOXACIN 500 MG PO TABS: 500.0000 mg | ORAL_TABLET | Freq: Every day | ORAL | Status: AC

## 2011-08-13 NOTE — Progress Notes (Signed)
  Subjective:    Patient ID: Ashley Castaneda, female    DOB: 1950-10-03, 61 y.o.   MRN: [id number]  HPI  Patient seen with productive cough. She has ongoing nicotine use has not yet started Wellbutrin which was prescribed last visit. She's had off and on cough for several weeks but especially over the past couple weeks. Productive of gray to brown sputum. No hemoptysis. No increased dyspnea over baseline. No documented fever. Occasional chills. Also having left facial maxillary sinus pressure and pain. Intermittent mild headache. Multiple drug allergies which are reviewed.  She has some intermittent vertigo symptoms in bilateral ear congestion.  No hearing changes.   Review of Systems  Constitutional: Positive for chills. Negative for fever.  HENT: Positive for ear pain, congestion and sinus pressure. Negative for sore throat.   Respiratory: Positive for cough. Negative for shortness of breath and wheezing.   Cardiovascular: Negative for chest pain.       Objective:   Physical Exam  Constitutional: She appears well-developed and well-nourished.  HENT:  Right Ear: External ear normal.  Left Ear: External ear normal.  Mouth/Throat: Oropharynx is clear and moist.  Neck: Neck supple.  Cardiovascular: Normal rate and regular rhythm.   Pulmonary/Chest: Effort normal and breath sounds normal. No respiratory distress. She has no wheezes. She has no rales.  Lymphadenopathy:    She has no cervical adenopathy.          Assessment & Plan:  Acute versus chronic bronchitis. Ongoing nicotine use. Possible left maxillary sinusitis. Given duration of symptoms start Levaquin 500 milligrams daily for 10 days. Consider chest x-ray and possible sinus films if no better by completion of antibiotic

## 2011-08-16 NOTE — Telephone Encounter (Signed)
Left message with husband to call back if needs to be seen.

## 2011-08-17 DIAGNOSIS — M545 Low back pain: Secondary | ICD-10-CM | POA: Diagnosis not present

## 2011-08-17 DIAGNOSIS — M531 Cervicobrachial syndrome: Secondary | ICD-10-CM | POA: Diagnosis not present

## 2011-08-17 DIAGNOSIS — M542 Cervicalgia: Secondary | ICD-10-CM | POA: Diagnosis not present

## 2011-08-17 DIAGNOSIS — R51 Headache: Secondary | ICD-10-CM | POA: Diagnosis not present

## 2011-09-03 DIAGNOSIS — IMO0002 Reserved for concepts with insufficient information to code with codable children: Secondary | ICD-10-CM | POA: Diagnosis not present

## 2011-09-03 DIAGNOSIS — G500 Trigeminal neuralgia: Secondary | ICD-10-CM | POA: Diagnosis not present

## 2011-09-03 DIAGNOSIS — IMO0001 Reserved for inherently not codable concepts without codable children: Secondary | ICD-10-CM | POA: Diagnosis not present

## 2011-09-03 DIAGNOSIS — M531 Cervicobrachial syndrome: Secondary | ICD-10-CM | POA: Diagnosis not present

## 2011-09-15 ENCOUNTER — Encounter: Payer: Self-pay | Admitting: Family Medicine

## 2011-09-15 ENCOUNTER — Ambulatory Visit (INDEPENDENT_AMBULATORY_CARE_PROVIDER_SITE_OTHER): Payer: Medicare Other | Admitting: Family Medicine

## 2011-09-15 VITALS — BP 130/72 | Temp 98.2°F | Wt 202.0 lb

## 2011-09-15 DIAGNOSIS — R05 Cough: Secondary | ICD-10-CM

## 2011-09-15 DIAGNOSIS — J310 Chronic rhinitis: Secondary | ICD-10-CM

## 2011-09-15 DIAGNOSIS — J329 Chronic sinusitis, unspecified: Secondary | ICD-10-CM

## 2011-09-15 DIAGNOSIS — R059 Cough, unspecified: Secondary | ICD-10-CM

## 2011-09-15 MED ORDER — FLUTICASONE PROPIONATE 50 MCG/ACT NA SUSP: 2.0000 | Freq: Every day | NASAL | Status: DC

## 2011-09-15 MED ORDER — FLUCONAZOLE 150 MG PO TABS: 150.0000 mg | ORAL_TABLET | Freq: Once | ORAL | Status: AC

## 2011-09-15 NOTE — Progress Notes (Signed)
Subjective:    Patient ID: Ashley Castaneda, female    DOB: [birth year], 61 y.o.   MRN: [id number]  HPI  Persistent left maxillary facial pain. She has noticed a productive cough. Started Levaquin and her cough is somewhat improved. She probably has an element of chronic bronchitis related to smoking. She subsequently was seen by the dentist and had some left upper posterior molar work. Was placed by the dentist on amoxicillin. She is also seeing headache specialist in [facility] and recently placed on doxycycline 100 mg twice a day which is currently taking. They speculated this may help her headaches based on tumor necrosis factor alpha antagonism. She's not sure this is helping her headaches yet. She remains on doxycycline 100 mg twice a day.  No fever. Mostly clear nasal discharge. Localized left maxillary facial pain. Chronic headaches. Somewhat poorly localized. No stiff neck.  She has noted rhinitis symptoms when changing temperature  Past Medical History  Diagnosis Date  . Unspecified vitamin D deficiency 03/31/2010  . ANXIETY 07/01/2008  . DEPRESSION 07/01/2008  . REFLEX SYMPATHETIC DYSTROPHY 07/01/2008  . Trigeminal neuralgia 05/21/2009  . Other specified trigeminal nerve disorders 07/01/2008  . ALLERGIC RHINITIS 07/01/2008  . BRONCHITIS, CHRONIC 07/01/2008  . GERD 07/01/2008  . ECZEMA 05/29/2009  . OSTEOARTHRITIS 07/01/2008  . OCCIPITAL NEURALGIA 07/01/2008  . BACK PAIN, THORACIC REGION 03/04/2010  . FIBROMYALGIA 07/01/2008  . LEG PAIN, BILATERAL 07/01/2008  . FACIAL PAIN 12/25/2009  . CARPAL TUNNEL SYNDROME, HX OF 07/01/2008  . DVT, HX OF 07/01/2008  . MIGRAINES, HX OF 07/01/2008  . CHRONIC OBSTRUCTIVE PULMONARY DISEASE, ACUTE EXACERBATION 08/07/2010   Past Surgical History  Procedure Date  . Lavh     BSO  . Tonsillectomy 1957  . Right foot bone spur 1987  . Left knee surgery 1989  . Right knee surgery 1990  . Right soulder surgery 1992, 1993, 1995, 1999  . Head craniectomy 1994   MICROVASULAR DECOMPRESSION  . Right foot surgery 1998    BAD CUT  . Right jaw surgery 2002  . Right thumb sugery 2004  . Carpal tunnel release 2009    RIGHT    reports that she has been smoking Cigarettes.  She has a 60 pack-year smoking history. She has never used smokeless tobacco. She reports that she does not drink alcohol or use illicit drugs. family history includes Cancer in her father; Hypertension in her father; Ovarian cancer in her mother; and Prostate cancer in her father. Allergies  Allergen Reactions  . Cefazolin     Ancef - in surgery, swelled up, turned blue, heart problems  . Butrans (Buprenorphine Hcl) Itching  . Baclofen     REACTION: confusion  . Carbamazepine     REACTION: confusion  . Ceclor (Cefaclor)     hives  . Fentanyl     REACTION: nausea and vomiting  . Gabapentin     REACTION: confusion  . Oxcarbazepine     REACTION: confusion      Review of Systems  Constitutional: Positive for fatigue. Negative for fever, chills and unexpected weight change.  HENT: Positive for congestion and sinus pressure.   Respiratory: Positive for cough. Negative for wheezing.   Cardiovascular: Negative for chest pain.  Gastrointestinal: Negative for abdominal pain.  Genitourinary: Negative for dysuria.  Neurological: Positive for weakness and headaches. Negative for dizziness.  Hematological: Negative for adenopathy.  Psychiatric/Behavioral: Negative for confusion.       Objective:   Physical Exam  Constitutional: She  appears well-developed and well-nourished.  HENT:  Right Ear: External ear normal.  Left Ear: External ear normal.  Nose: Nose normal.  Mouth/Throat: Oropharynx is clear and moist.  Neck: Neck supple.  Cardiovascular: Normal rate and regular rhythm.   Pulmonary/Chest: Effort normal and breath sounds normal. No respiratory distress. She has no wheezes. She has no rales.  Lymphadenopathy:    She has no cervical adenopathy.            Assessment & Plan:  Persistent left maxillary facial pain. She has been on multiple antibiotic courses. Limited CT maxillofacial without contrast to further evaluate. Also start Flonase nasal 2 sprays per nostril once daily. She may have concomitant component of allergic and nonallergic rhinitis

## 2011-09-20 DIAGNOSIS — R350 Frequency of micturition: Secondary | ICD-10-CM | POA: Diagnosis not present

## 2011-09-20 DIAGNOSIS — N3946 Mixed incontinence: Secondary | ICD-10-CM | POA: Diagnosis not present

## 2011-09-20 DIAGNOSIS — R39198 Other difficulties with micturition: Secondary | ICD-10-CM | POA: Diagnosis not present

## 2011-09-21 ENCOUNTER — Ambulatory Visit (INDEPENDENT_AMBULATORY_CARE_PROVIDER_SITE_OTHER)
Admission: RE | Admit: 2011-09-21 | Discharge: 2011-09-21 | Disposition: A | Discharge status: Home / Self Care | Payer: Medicare Other | Source: Ambulatory Visit | Attending: Family Medicine | Admitting: Family Medicine

## 2011-09-21 DIAGNOSIS — R51 Headache: Secondary | ICD-10-CM | POA: Diagnosis not present

## 2011-09-21 DIAGNOSIS — J329 Chronic sinusitis, unspecified: Secondary | ICD-10-CM | POA: Diagnosis not present

## 2011-09-22 NOTE — Progress Notes (Signed)
Quick Note:  Pt informed and she is wondering what is the next step? Why all the recurrent congestion, etc? ______

## 2011-09-23 ENCOUNTER — Other Ambulatory Visit: Payer: Self-pay | Admitting: Family Medicine

## 2011-09-23 DIAGNOSIS — J3489 Other specified disorders of nose and nasal sinuses: Secondary | ICD-10-CM

## 2011-09-23 NOTE — Progress Notes (Signed)
Quick Note:  Pt informed, saw an allergist in the 70's. Will refer to [place] ______

## 2011-10-01 DIAGNOSIS — IMO0001 Reserved for inherently not codable concepts without codable children: Secondary | ICD-10-CM | POA: Diagnosis not present

## 2011-10-01 DIAGNOSIS — IMO0002 Reserved for concepts with insufficient information to code with codable children: Secondary | ICD-10-CM | POA: Diagnosis not present

## 2011-10-01 DIAGNOSIS — G43119 Migraine with aura, intractable, without status migrainosus: Secondary | ICD-10-CM | POA: Diagnosis not present

## 2011-10-01 DIAGNOSIS — G43719 Chronic migraine without aura, intractable, without status migrainosus: Secondary | ICD-10-CM | POA: Diagnosis not present

## 2011-10-05 DIAGNOSIS — J309 Allergic rhinitis, unspecified: Secondary | ICD-10-CM | POA: Diagnosis not present

## 2011-10-05 DIAGNOSIS — J449 Chronic obstructive pulmonary disease, unspecified: Secondary | ICD-10-CM | POA: Diagnosis not present

## 2011-10-14 DIAGNOSIS — G894 Chronic pain syndrome: Secondary | ICD-10-CM | POA: Diagnosis not present

## 2011-10-14 DIAGNOSIS — M545 Low back pain: Secondary | ICD-10-CM | POA: Diagnosis not present

## 2011-10-14 DIAGNOSIS — R51 Headache: Secondary | ICD-10-CM | POA: Diagnosis not present

## 2011-10-14 DIAGNOSIS — M531 Cervicobrachial syndrome: Secondary | ICD-10-CM | POA: Diagnosis not present

## 2011-10-23 ENCOUNTER — Encounter (HOSPITAL_COMMUNITY): Payer: Self-pay | Admitting: Emergency Medicine

## 2011-10-23 ENCOUNTER — Emergency Department (HOSPITAL_COMMUNITY)
Admission: EM | Admit: 2011-10-23 | Discharge: 2011-10-23 | Disposition: A | Discharge status: Home / Self Care | Payer: Medicare Other | Attending: Emergency Medicine | Admitting: Emergency Medicine

## 2011-10-23 DIAGNOSIS — F329 Major depressive disorder, single episode, unspecified: Secondary | ICD-10-CM | POA: Insufficient documentation

## 2011-10-23 DIAGNOSIS — F411 Generalized anxiety disorder: Secondary | ICD-10-CM | POA: Diagnosis not present

## 2011-10-23 DIAGNOSIS — J4489 Other specified chronic obstructive pulmonary disease: Secondary | ICD-10-CM | POA: Insufficient documentation

## 2011-10-23 DIAGNOSIS — M79609 Pain in unspecified limb: Secondary | ICD-10-CM | POA: Insufficient documentation

## 2011-10-23 DIAGNOSIS — IMO0001 Reserved for inherently not codable concepts without codable children: Secondary | ICD-10-CM | POA: Diagnosis not present

## 2011-10-23 DIAGNOSIS — F17200 Nicotine dependence, unspecified, uncomplicated: Secondary | ICD-10-CM | POA: Insufficient documentation

## 2011-10-23 DIAGNOSIS — I82409 Acute embolism and thrombosis of unspecified deep veins of unspecified lower extremity: Secondary | ICD-10-CM | POA: Diagnosis not present

## 2011-10-23 DIAGNOSIS — J449 Chronic obstructive pulmonary disease, unspecified: Secondary | ICD-10-CM | POA: Diagnosis not present

## 2011-10-23 DIAGNOSIS — G9050 Complex regional pain syndrome I, unspecified: Secondary | ICD-10-CM | POA: Insufficient documentation

## 2011-10-23 DIAGNOSIS — K219 Gastro-esophageal reflux disease without esophagitis: Secondary | ICD-10-CM | POA: Diagnosis not present

## 2011-10-23 DIAGNOSIS — M7989 Other specified soft tissue disorders: Secondary | ICD-10-CM | POA: Diagnosis not present

## 2011-10-23 DIAGNOSIS — Z79899 Other long term (current) drug therapy: Secondary | ICD-10-CM | POA: Diagnosis not present

## 2011-10-23 DIAGNOSIS — Z86718 Personal history of other venous thrombosis and embolism: Secondary | ICD-10-CM | POA: Insufficient documentation

## 2011-10-23 DIAGNOSIS — F3289 Other specified depressive episodes: Secondary | ICD-10-CM | POA: Insufficient documentation

## 2011-10-23 LAB — BASIC METABOLIC PANEL
CO2: 27 mEq/L (ref 19–32)
Calcium: 8.3 mg/dL — ABNORMAL LOW (ref 8.4–10.5)
GFR calc non Af Amer: >90 mL/min (ref >90)
Sodium: 139 mEq/L (ref 135–145)

## 2011-10-23 LAB — DIFFERENTIAL
Basophils Absolute: 0.1 10*3/uL (ref 0.0–0.1)
Eosinophils Absolute: 0.3 10*3/uL (ref 0.0–0.7)
Eosinophils Relative: 3 % (ref 0–5)
Lymphocytes Relative: 25 % (ref 12–46)
Neutrophils Relative %: 62 % (ref 43–77)

## 2011-10-23 LAB — CBC
MCV: 92.6 fL (ref 78.0–100.0)
Platelets: 241 10*3/uL (ref 150–400)
RDW: 13.9 % (ref 11.5–15.5)
WBC: 10.2 10*3/uL (ref 4.0–10.5)

## 2011-10-23 LAB — PROTIME-INR
INR: 0.88 (ref 0.00–1.49)
Prothrombin Time: 12.1 seconds (ref 11.6–15.2)

## 2011-10-23 MED ORDER — OXYCODONE-ACETAMINOPHEN 5-325 MG PO TABS
2.0000 | ORAL_TABLET | Freq: Once | ORAL | Status: DC
  Filled 2011-10-23: qty 2

## 2011-10-23 MED ORDER — ENOXAPARIN SODIUM 100 MG/ML ~~LOC~~ SOLN
90.0000 mg | SUBCUTANEOUS | Status: AC
  Administered 2011-10-23: 90 mg via SUBCUTANEOUS
  Filled 2011-10-23: qty 1

## 2011-10-23 MED ORDER — ENOXAPARIN SODIUM 60 MG/0.6ML ~~LOC~~ SOLN
90.0000 mg | SUBCUTANEOUS | Status: DC
  Filled 2011-10-23: qty 0.3

## 2011-10-23 NOTE — ED Notes (Signed)
PT. REPORTS RIGHT LOWER LEG PAIN /SWELLING ONSET YESTERDAY , DENIES INJURY OR FALL .

## 2011-10-23 NOTE — ED Provider Notes (Signed)
  I performed a history and physical examination of Ashley Castaneda and discussed her management with Dr. Criss Alvine.  I agree with the history, physical, assessment, and plan of care, with the following exceptions: None  61 year old presents with left lower terminate pain and swelling which is been present over the past one to 2 days. She had no injury mechanism. The pain is present in her left calf and left lower leg. The swelling has persisted despite elevation. On physical examination she has 1-2+ pitting edema in the left lower extremity. She has palpable DP and PT pulses. She has a positive Homans sign. Laboratory studies were checked. She has a normal creatinine therefore she'll receive a dose of Lovenox and will be brought back tomorrow morning for a venous Doppler  I was present for the following procedures: None Time Spent in Critical Care of the patient: None Time spent in discussions with the patient and family: 10 min  Tildon Husky, MD 10/23/11 [phone]

## 2011-10-23 NOTE — ED Provider Notes (Signed)
History     CSN: [id number]  Arrival date & time 10/23/11  4098   First MD Initiated Contact with Patient 10/23/11 2058      Chief Complaint  Patient presents with  . Leg Pain    (Consider location/radiation/quality/duration/timing/severity/associated sxs/prior treatment) Patient is a 61 y.o. female presenting with leg pain. The history is provided by the patient.  Leg Pain  The incident occurred yesterday. The incident occurred at home. There was no injury mechanism. The pain is present in the left leg. The pain is at a severity of 8/10. Pertinent negatives include no numbness, no inability to bear weight, no loss of motion, no muscle weakness, no loss of sensation and no tingling. The symptoms are aggravated by activity and palpation. She has tried elevation for the symptoms. The treatment provided mild relief.    Past Medical History  Diagnosis Date  . Unspecified vitamin D deficiency 03/31/2010  . ANXIETY 07/01/2008  . DEPRESSION 07/01/2008  . REFLEX SYMPATHETIC DYSTROPHY 07/01/2008  . Trigeminal neuralgia 05/21/2009  . Other specified trigeminal nerve disorders 07/01/2008  . ALLERGIC RHINITIS 07/01/2008  . BRONCHITIS, CHRONIC 07/01/2008  . GERD 07/01/2008  . ECZEMA 05/29/2009  . OSTEOARTHRITIS 07/01/2008  . OCCIPITAL NEURALGIA 07/01/2008  . BACK PAIN, THORACIC REGION 03/04/2010  . FIBROMYALGIA 07/01/2008  . LEG PAIN, BILATERAL 07/01/2008  . FACIAL PAIN 12/25/2009  . CARPAL TUNNEL SYNDROME, HX OF 07/01/2008  . DVT, HX OF 07/01/2008  . MIGRAINES, HX OF 07/01/2008  . CHRONIC OBSTRUCTIVE PULMONARY DISEASE, ACUTE EXACERBATION 08/07/2010    Past Surgical History  Procedure Date  . Lavh     BSO  . Tonsillectomy 1957  . Right foot bone spur 1987  . Left knee surgery 1989  . Right knee surgery 1990  . Right soulder surgery 1992, 1993, 1995, 1999  . Head craniectomy 1994    MICROVASULAR DECOMPRESSION  . Right foot surgery 1998    BAD CUT  . Right jaw surgery 2002  . Right thumb sugery  2004  . Carpal tunnel release 2009    RIGHT    Family History  Problem Relation Age of Onset  . Ovarian cancer Mother   . Hypertension Father   . Prostate cancer Father   . Cancer Father     PROSTATE    History  Substance Use Topics  . Smoking status: Current Everyday Smoker -- 2.0 packs/day for 30 years    Types: Cigarettes  . Smokeless tobacco: Never Used  . Alcohol Use: No    OB History    Grav Para Term Preterm Abortions TAB SAB Ect Mult Living   0               Review of Systems  Constitutional: Negative for fever and chills.  Respiratory: Negative for shortness of breath.   Cardiovascular: Positive for leg swelling. Negative for chest pain.  Gastrointestinal: Negative for nausea, vomiting, abdominal pain and constipation.  Genitourinary: Negative for urgency, decreased urine volume and difficulty urinating.  Skin: Positive for color change (redness to posterior leg). Negative for pallor.  Neurological: Negative for tingling, numbness and headaches.  All other systems reviewed and are negative.    Allergies  Cefazolin; Butrans; Baclofen; Carbamazepine; Ceclor; Fentanyl; Gabapentin; and Oxcarbazepine  Home Medications   Current Outpatient Rx  Name Route Sig Dispense Refill  . ALPRAZOLAM 0.5 MG PO TABS Oral Take 0.25-0.5 mg by mouth 2 (two) times daily. 1/2 tab in am and 1 at bedtime    .  CETIRIZINE HCL 10 MG PO TABS Oral Take 10 mg by mouth daily.      Marland Kitchen CINNAMON 500 MG PO CAPS Oral Take 500 mg by mouth daily.      Marland Kitchen DOXYCYCLINE HYCLATE 100 MG PO CAPS Oral Take 100 mg by mouth 2 (two) times daily.    Marland Kitchen ESTROGENS CONJUGATED 0.9 MG PO TABS Oral Take 0.9 mg by mouth daily at 6 PM. Take daily for 21 days then do not take for 7 days.    Marland Kitchen FLUTICASONE PROPIONATE 50 MCG/ACT NA SUSP Nasal Place 2 sprays into the nose daily as needed. For nasal congestion    . FROVATRIPTAN SUCCINATE 2.5 MG PO TABS Oral Take 2.5 mg by mouth as needed. If recurs, may repeat after 2  hours. Max of 3 tabs in 24 hours. For migraines.    . FUROSEMIDE 20 MG PO TABS Oral Take 20 mg by mouth daily as needed. For swelling    . GUAIFENESIN ER 600 MG PO TB12 Oral Take 1,200 mg by mouth 2 (two) times daily.    Marland Kitchen HYDROXYZINE HCL 10 MG PO TABS Oral Take 10 mg by mouth every 8 (eight) hours as needed. For dizziness    . METAXALONE 800 MG PO TABS Oral Take 800 mg by mouth 3 (three) times daily.      Marland Kitchen METOCLOPRAMIDE HCL 10 MG PO TABS Oral Take 10 mg by mouth 3 (three) times daily as needed. For nausea    . MONTELUKAST SODIUM 10 MG PO TABS Oral Take 10 mg by mouth daily at 6 PM.    . ONE-DAILY MULTI VITAMINS PO TABS Oral Take 1 tablet by mouth every morning.     Marland Kitchen FISH OIL 1200 MG PO CAPS Oral Take 1 capsule by mouth every evening.    Marland Kitchen RABEPRAZOLE SODIUM 20 MG PO TBEC Oral Take 20 mg by mouth every morning.    Marland Kitchen SILODOSIN 8 MG PO CAPS Oral Take 8 mg by mouth daily with breakfast.      . TIZANIDINE HCL 2 MG PO TABS Oral Take 2 mg by mouth at bedtime.    Marland Kitchen VITAMIN E 400 UNITS PO CAPS Oral Take 400 Units by mouth daily at 6 PM.       BP 123/63  Pulse 98  Temp(Src) 98.4 F (36.9 C) (Oral)  Resp 18  SpO2 97%  LMP 07/26/1992  Physical Exam  Nursing note and vitals reviewed. Constitutional: She is oriented to person, place, and time. She appears well-developed and well-nourished. No distress.  HENT:  Head: Normocephalic and atraumatic.  Right Ear: External ear normal.  Left Ear: External ear normal.  Nose: Nose normal.  Mouth/Throat: Oropharynx is clear and moist.  Neck: Neck supple.  Cardiovascular: Normal rate, regular rhythm, normal heart sounds and intact distal pulses.   Pulmonary/Chest: Effort normal and breath sounds normal. No respiratory distress. She has no wheezes. She has no rales.  Abdominal: Soft. She exhibits no distension. There is no tenderness.  Musculoskeletal: She exhibits no edema.       Right lower leg: She exhibits tenderness and swelling. She exhibits no  deformity and no laceration.       Legs: Lymphadenopathy:    She has no cervical adenopathy.  Neurological: She is alert and oriented to person, place, and time.  Skin: Skin is warm and dry. She is not diaphoretic. No pallor.    ED Course  Procedures (including critical care time)  Labs Reviewed  BASIC METABOLIC  PANEL - Abnormal; Notable for the following:    Potassium 3.1 (*)    Calcium 8.3 (*)    All other components within normal limits  CBC  DIFFERENTIAL  PROTIME-INR  APTT      1. Leg swelling       MDM  61 yo female with acutely swollen right lower leg since yesterday. Distant history of DVT (15+ years ago). Painful, mildly erythematous. Neurovascularly intact distally. Likely a DVT, however we are at hours where unable to get ultrasound. Labs benign, no white count. Do not believe this is cellulitis due to diffuse swelling with minimal to no erythema. Per protocol will give lovenox (renal function normal) and will come back tomorrow for ultrasound by vascular lab. Discussed strict return precautions and importance of getting the ultrasound.         Pricilla Loveless, MD 10/23/11 [phone]

## 2011-10-23 NOTE — ED Notes (Signed)
PT provided with  heat pad

## 2011-10-23 NOTE — ED Notes (Signed)
Pt comes to the ED complaining of "throbbing" right calf pain that radiates to the right hip that started yesterday.  Pt states the calf has increasingly gotten swollen.

## 2011-10-23 NOTE — Discharge Instructions (Signed)
Make sure to come back tomorrow for your ultrasound between 8:00 and 8:30 AM. If you have shortness of breath or chest pain or fever return sooner.   Edema Edema is an abnormal build-up of fluids in tissues. Because this is partly dependent on gravity (water flows to the lowest place), it is more common in the leg sand thighs (lower extremities). It is also common in the looser tissues, like around the eyes. Painless swelling of the feet and ankles is common and increases as a person ages. It may affect both legs and may include the calves or even thighs. When squeezed, the fluid may move out of the affected area and may leave a dent for a few moments. CAUSES   Prolonged standing or sitting in one place for extended periods of time. Movement helps pump tissue fluid into the veins, and absence of movement prevents this, resulting in edema.   Varicose veins. The valves in the veins do not work as well as they should. This causes fluid to leak into the tissues.   Fluid and salt overload.   Injury, burn, or surgery to the leg, ankle, or foot, may damage veins and allow fluid to leak out.   Sunburn damages vessels. Leaky vessels allow fluid to go out into the sunburned tissues.   Allergies (from insect bites or stings, medications or chemicals) cause swelling by allowing vessels to become leaky.   Protein in the blood helps keep fluid in your vessels. Low protein, as in malnutrition, allows fluid to leak out.   Hormonal changes, including pregnancy and menstruation, cause fluid retention. This fluid may leak out of vessels and cause edema.   Medications that cause fluid retention. Examples are sex hormones, blood pressure medications, steroid treatment, or anti-depressants.   Some illnesses cause edema, especially heart failure, kidney disease, or liver disease.   Surgery that cuts veins or lymph nodes, such as surgery done for the heart or for breast cancer, may result in edema.  DIAGNOSIS    Your caregiver is usually easily able to determine what is causing your swelling (edema) by simply asking what is wrong (getting a history) and examining you (doing a physical). Sometimes x-rays, EKG (electrocardiogram or heart tracing), and blood work may be done to evaluate for underlying medical illness. TREATMENT  General treatment includes:  Leg elevation (or elevation of the affected body part).   Restriction of fluid intake.   Prevention of fluid overload.   Compression of the affected body part. Compression with elastic bandages or support stockings squeezes the tissues, preventing fluid from entering and forcing it back into the blood vessels.   Diuretics (also called water pills or fluid pills) pull fluid out of your body in the form of increased urination. These are effective in reducing the swelling, but can have side effects and must be used only under your caregiver's supervision. Diuretics are appropriate only for some types of edema.  The specific treatment can be directed at any underlying causes discovered. Heart, liver, or kidney disease should be treated appropriately. HOME CARE INSTRUCTIONS   Elevate the legs (or affected body part) above the level of the heart, while lying down.   Avoid sitting or standing still for prolonged periods of time.   Avoid putting anything directly under the knees when lying down, and do not wear constricting clothing or garters on the upper legs.   Exercising the legs causes the fluid to work back into the veins and lymphatic channels. This may  help the swelling go down.   The pressure applied by elastic bandages or support stockings can help reduce ankle swelling.   A low-salt diet may help reduce fluid retention and decrease the ankle swelling.   Take any medications exactly as prescribed.  SEEK MEDICAL CARE IF:  Your edema is not responding to recommended treatments. SEEK IMMEDIATE MEDICAL CARE IF:   You develop shortness of  breath or chest pain.   You cannot breathe when you lay down; or if, while lying down, you have to get up and go to the window to get your breath.   You are having increasing swelling without relief from treatment.   You develop a fever over 102 F (38.9 C).   You develop pain or redness in the areas that are swollen.   Tell your caregiver right away if you have gained 3 lb/1.4 kg in 1 day or 5 lb/2.3 kg in a week.  MAKE SURE YOU:   Understand these instructions.   Will watch your condition.   Will get help right away if you are not doing well or get worse.  Document Released: 07/12/2005 Document Revised: 07/01/2011 Document Reviewed: 02/28/2008 [facility] Patient Information 2012 [place], [place].

## 2011-10-24 ENCOUNTER — Emergency Department (HOSPITAL_COMMUNITY)
Admission: EM | Admit: 2011-10-24 | Discharge: 2011-10-24 | Disposition: A | Discharge status: Home / Self Care | Payer: Medicare Other | Attending: Emergency Medicine | Admitting: Emergency Medicine

## 2011-10-24 ENCOUNTER — Encounter (HOSPITAL_COMMUNITY): Payer: Self-pay

## 2011-10-24 ENCOUNTER — Ambulatory Visit (HOSPITAL_COMMUNITY)
Admission: RE | Admit: 2011-10-24 | Discharge: 2011-10-24 | Disposition: A | Discharge status: Home / Self Care | Payer: Medicare Other | Source: Ambulatory Visit | Attending: Family Medicine | Admitting: Family Medicine

## 2011-10-24 DIAGNOSIS — M7989 Other specified soft tissue disorders: Secondary | ICD-10-CM | POA: Insufficient documentation

## 2011-10-24 DIAGNOSIS — I82409 Acute embolism and thrombosis of unspecified deep veins of unspecified lower extremity: Secondary | ICD-10-CM | POA: Insufficient documentation

## 2011-10-24 DIAGNOSIS — R52 Pain, unspecified: Secondary | ICD-10-CM

## 2011-10-24 DIAGNOSIS — F17200 Nicotine dependence, unspecified, uncomplicated: Secondary | ICD-10-CM | POA: Insufficient documentation

## 2011-10-24 DIAGNOSIS — M79609 Pain in unspecified limb: Secondary | ICD-10-CM | POA: Insufficient documentation

## 2011-10-24 DIAGNOSIS — R609 Edema, unspecified: Secondary | ICD-10-CM

## 2011-10-24 MED ORDER — WARFARIN SODIUM 7.5 MG PO TABS
7.5000 mg | ORAL_TABLET | Freq: Every day | ORAL | Status: DC
  Administered 2011-10-24: 7.5 mg via ORAL
  Filled 2011-10-24: qty 1

## 2011-10-24 MED ORDER — ENOXAPARIN SODIUM 100 MG/ML ~~LOC~~ SOLN
90.0000 mg | Freq: Two times a day (BID) | SUBCUTANEOUS | Status: DC
  Administered 2011-10-24: 90 mg via SUBCUTANEOUS
  Filled 2011-10-24: qty 1

## 2011-10-24 MED ORDER — WARFARIN SODIUM 7.5 MG PO TABS: 7.5000 mg | ORAL_TABLET | Freq: Every day | ORAL | Status: DC

## 2011-10-24 MED ORDER — WARFARIN VIDEO
Freq: Once | Status: AC
  Administered 2011-10-24: 12:00:00

## 2011-10-24 MED ORDER — COUMADIN BOOK
Freq: Once | Status: AC
  Administered 2011-10-24: 12:00:00
  Filled 2011-10-24: qty 1

## 2011-10-24 MED ORDER — ENOXAPARIN SODIUM 100 MG/ML ~~LOC~~ SOLN: 90.0000 mg | Freq: Two times a day (BID) | SUBCUTANEOUS | Status: DC

## 2011-10-24 MED ORDER — ACETAMINOPHEN-CODEINE #3 300-30 MG PO TABS: 1.0000 | ORAL_TABLET | Freq: Four times a day (QID) | ORAL | Status: DC | PRN

## 2011-10-24 MED ORDER — ENOXAPARIN SODIUM 100 MG/ML ~~LOC~~ SOLN
90.0000 mg | SUBCUTANEOUS | Status: DC
  Filled 2011-10-24: qty 1

## 2011-10-24 MED ORDER — ENOXAPARIN (LOVENOX) PATIENT EDUCATION KIT
PACK | Freq: Once | Status: AC
  Administered 2011-10-24: 13:00:00
  Filled 2011-10-24: qty 1

## 2011-10-24 MED ORDER — WARFARIN - PHARMACIST DOSING INPATIENT: Freq: Every day | Status: DC

## 2011-10-24 NOTE — ED Notes (Signed)
Pt. Was here last night for rt. Leg pain and was diagnosed with a blood clot this am in her rt. leg

## 2011-10-24 NOTE — ED Notes (Signed)
PA at bedside.

## 2011-10-24 NOTE — ED Provider Notes (Signed)
History     CSN: [id number]  Arrival date & time 10/24/11  4098   First MD Initiated Contact with Patient 10/24/11 1012      Chief Complaint  Patient presents with  . Leg Pain    (Consider location/radiation/quality/duration/timing/severity/associated sxs/prior treatment) HPI  Patient was seen in ER last night with complaint of right lower extremity pain and swelling x one week that was gradual onset and worsening was given Lovenox prophylactically in ER and sent to vascular lab for duplex ultrasound this morning. Of note, ER note from last night states left lower extremity pain and swelling  however I verified with both the patient and the vascular tech who performed ultrasound that her pain and swelling in her right leg. Vascular ultrasound does reveal DVT of RIGHT lower extremity and NO DVT of left lower extremity with patient denying pain in her left lower extremity however pain and swelling in her right lower extremity. Patient states she's been on estrogen for many years after total hysterectomy many years ago. She also smokes tobacco though she states she's been urged by numerous providers to quit smoking. Patient denies any chest pain or shortness of breath. Symptoms are gradual onset, persistent, worsening. Patient denies fevers and chills. Patient is seen by Dr. Neysa Bonito as PCP. Patient states she easily cares for herself at home and takes her own medications. She denies prior hx of PE or DVT but states she was told many years ago she had phlebitis. She denies cough or hemoptysis. Patient states she had been elevating her leg over the last week without relief of swelling or pain.   Past Medical History  Diagnosis Date  . Unspecified vitamin D deficiency 03/31/2010  . ANXIETY 07/01/2008  . DEPRESSION 07/01/2008  . REFLEX SYMPATHETIC DYSTROPHY 07/01/2008  . Trigeminal neuralgia 05/21/2009  . Other specified trigeminal nerve disorders 07/01/2008  . ALLERGIC RHINITIS 07/01/2008  .  BRONCHITIS, CHRONIC 07/01/2008  . GERD 07/01/2008  . ECZEMA 05/29/2009  . OSTEOARTHRITIS 07/01/2008  . OCCIPITAL NEURALGIA 07/01/2008  . BACK PAIN, THORACIC REGION 03/04/2010  . FIBROMYALGIA 07/01/2008  . LEG PAIN, BILATERAL 07/01/2008  . FACIAL PAIN 12/25/2009  . CARPAL TUNNEL SYNDROME, HX OF 07/01/2008  . DVT, HX OF 07/01/2008  . MIGRAINES, HX OF 07/01/2008  . CHRONIC OBSTRUCTIVE PULMONARY DISEASE, ACUTE EXACERBATION 08/07/2010    Past Surgical History  Procedure Date  . Lavh     BSO  . Tonsillectomy 1957  . Right foot bone spur 1987  . Left knee surgery 1989  . Right knee surgery 1990  . Right soulder surgery 1992, 1993, 1995, 1999  . Head craniectomy 1994    MICROVASULAR DECOMPRESSION  . Right foot surgery 1998    BAD CUT  . Right jaw surgery 2002  . Right thumb sugery 2004  . Carpal tunnel release 2009    RIGHT    Family History  Problem Relation Age of Onset  . Ovarian cancer Mother   . Hypertension Father   . Prostate cancer Father   . Cancer Father     PROSTATE    History  Substance Use Topics  . Smoking status: Current Everyday Smoker -- 2.0 packs/day for 30 years    Types: Cigarettes  . Smokeless tobacco: Never Used  . Alcohol Use: No    OB History    Grav Para Term Preterm Abortions TAB SAB Ect Mult Living   0  Review of Systems  All other systems reviewed and are negative.    Allergies  Cefazolin; Butrans; Baclofen; Carbamazepine; Ceclor; Fentanyl; Gabapentin; and Oxcarbazepine  Home Medications   Current Outpatient Rx  Name Route Sig Dispense Refill  . ALPRAZOLAM 0.5 MG PO TABS Oral Take 0.25-0.5 mg by mouth 2 (two) times daily. 1/2 tab in am and 1 at bedtime    . CETIRIZINE HCL 10 MG PO TABS Oral Take 10 mg by mouth daily.      Marland Kitchen CINNAMON 500 MG PO CAPS Oral Take 500 mg by mouth daily.      Marland Kitchen DOXYCYCLINE HYCLATE 100 MG PO CAPS Oral Take 100 mg by mouth 2 (two) times daily.    Marland Kitchen ESTROGENS CONJUGATED 0.9 MG PO TABS Oral Take 0.9 mg  by mouth daily at 6 PM. Take daily for 21 days then do not take for 7 days.    Marland Kitchen FLUTICASONE PROPIONATE 50 MCG/ACT NA SUSP Nasal Place 2 sprays into the nose daily as needed. For nasal congestion    . FROVATRIPTAN SUCCINATE 2.5 MG PO TABS Oral Take 2.5 mg by mouth as needed. If recurs, may repeat after 2 hours. Max of 3 tabs in 24 hours. For migraines.    . FUROSEMIDE 20 MG PO TABS Oral Take 20 mg by mouth daily as needed. For swelling    . GUAIFENESIN ER 600 MG PO TB12 Oral Take 1,200 mg by mouth 2 (two) times daily.    Marland Kitchen HYDROXYZINE HCL 10 MG PO TABS Oral Take 10 mg by mouth every 8 (eight) hours as needed. For dizziness    . METAXALONE 800 MG PO TABS Oral Take 800 mg by mouth 3 (three) times daily.      Marland Kitchen METOCLOPRAMIDE HCL 10 MG PO TABS Oral Take 10 mg by mouth 3 (three) times daily as needed. For nausea    . MONTELUKAST SODIUM 10 MG PO TABS Oral Take 10 mg by mouth daily at 6 PM.    . ONE-DAILY MULTI VITAMINS PO TABS Oral Take 1 tablet by mouth every morning.     Marland Kitchen FISH OIL 1200 MG PO CAPS Oral Take 1 capsule by mouth every evening.    Marland Kitchen RABEPRAZOLE SODIUM 20 MG PO TBEC Oral Take 20 mg by mouth every morning.    Marland Kitchen SILODOSIN 8 MG PO CAPS Oral Take 8 mg by mouth daily with breakfast.      . TIZANIDINE HCL 2 MG PO TABS Oral Take 2 mg by mouth at bedtime.    Marland Kitchen VITAMIN E 400 UNITS PO CAPS Oral Take 400 Units by mouth daily at 6 PM.       BP 116/81  Pulse 76  Temp(Src) 97.7 F (36.5 C) (Oral)  Resp 20  SpO2 94%  LMP 07/26/1992  Physical Exam  Nursing note and vitals reviewed. Constitutional: She is oriented to person, place, and time. She appears well-developed and well-nourished. No distress.  HENT:  Head: Normocephalic and atraumatic.  Eyes: Conjunctivae are normal.  Neck: Normal range of motion. Neck supple.  Cardiovascular: Normal rate, regular rhythm, normal heart sounds and intact distal pulses.  Exam reveals no gallop and no friction rub.   No murmur heard. Pulmonary/Chest:  Effort normal and breath sounds normal. No respiratory distress. She has no wheezes. She has no rales. She exhibits no tenderness.  Abdominal: Soft. Bowel sounds are normal. She exhibits no distension and no mass. There is no tenderness. There is no rebound and no guarding.  Musculoskeletal: Normal range of motion. She exhibits edema and tenderness.       Faint erythema of RLE with swelling of RLE and mild TTP. No crepitous. No swelling, TTP or redness of LLE.  Good pedal pulses and femoral pulses bilaterally. Normal sensation.   Neurological: She is alert and oriented to person, place, and time.  Skin: Skin is warm and dry. No rash noted. She is not diaphoretic. No erythema.  Psychiatric: She has a normal mood and affect.    ED Course  Procedures (including critical care time)  Pharmacy Consult Plan:  1) Lovenox 90 mg sq Q 12 hours  2) Coumadin 7.5 mg po daily at 1800 pm  3) Daily INR  4) Begin Coumadin education  5) Follow up CBC, Scr  Elwin Sleight  10/24/2011,11:03 AM    Labs Reviewed  PROTIME-INR   No results found.   1. DVT of leg (deep venous thrombosis)    Patient evaluation and treatment plan discussed with Dr. Ignacia Palma   MDM  Patient has no signs or symptoms of cellulitis with her right lower extremity neurovascularly intact. Doppler ultrasound that showed DVT of right lower [place]. No pain or swelling the left lower extreme no DVT on ultrasound. Patient states she's agreeable to outpatient management of DVT with Lovenox and Coumadin. She's also agreeable to close followup for INR checks. Patient has an established primary care physician. Spoke at length about the need for very close followup for INR and Coumadin therapy. Patient voices understanding. There's no signs or symptoms of pulmonary embolism while patient is denying any chest pain or shortness of breath. Pharmacy has consulted on patient to dose of both Coumadin and Lovenox. Patient watched the  instructional Lovenox video and voices her understanding of Lovenox administration. She's also given Coumadin informational booklet that she states she can read and understand.        Jenness [place], [place] 10/24/11 1303

## 2011-10-24 NOTE — ED Notes (Signed)
Pt in 3700 watching Warfarin educational video

## 2011-10-24 NOTE — Progress Notes (Signed)
ANTICOAGULATION CONSULT NOTE - Initial Consult  Pharmacy Consult for Lovenox / Coumadin Indication: DVT  Allergies  Allergen Reactions  . Cefazolin     Ancef - in surgery, swelled up, turned blue, heart problems  . Butrans (Buprenorphine Hcl) Itching  . Baclofen     REACTION: confusion  . Carbamazepine     REACTION: confusion  . Ceclor (Cefaclor)     hives  . Fentanyl     REACTION: nausea and vomiting  . Gabapentin     REACTION: confusion  . Oxcarbazepine     REACTION: confusion    Patient Measurements: Wt. = 92 kg  Vital Signs: Temp: 97.7 F (36.5 C) (03/31 0958) Temp src: Oral (03/31 0958) BP: 116/81 mmHg (03/31 0958) Pulse Rate: 76  (03/31 0958)  Labs:  Alvira Philips 10/23/11 2120 10/23/11 2046  HGB -- 12.7  HCT -- 37.3  PLT -- 241  APTT 29 --  LABPROT 12.1 --  INR 0.88 --  HEPARINUNFRC -- --  CREATININE -- 0.53  CKTOTAL -- --  CKMB -- --  TROPONINI -- --   The CrCl is unknown because both a height and weight (above a minimum accepted value) are required for this calculation.  Medical History: Past Medical History  Diagnosis Date  . Unspecified vitamin D deficiency 03/31/2010  . ANXIETY 07/01/2008  . DEPRESSION 07/01/2008  . REFLEX SYMPATHETIC DYSTROPHY 07/01/2008  . Trigeminal neuralgia 05/21/2009  . Other specified trigeminal nerve disorders 07/01/2008  . ALLERGIC RHINITIS 07/01/2008  . BRONCHITIS, CHRONIC 07/01/2008  . GERD 07/01/2008  . ECZEMA 05/29/2009  . OSTEOARTHRITIS 07/01/2008  . OCCIPITAL NEURALGIA 07/01/2008  . BACK PAIN, THORACIC REGION 03/04/2010  . FIBROMYALGIA 07/01/2008  . LEG PAIN, BILATERAL 07/01/2008  . FACIAL PAIN 12/25/2009  . CARPAL TUNNEL SYNDROME, HX OF 07/01/2008  . DVT, HX OF 07/01/2008  . MIGRAINES, HX OF 07/01/2008  . CHRONIC OBSTRUCTIVE PULMONARY DISEASE, ACUTE EXACERBATION 08/07/2010    Assessment: 61 year old with DVT.  Scr stable, baseline INR = 0.88  Goal of Therapy:  INR = 2 to 3 Appropriate Lovenox dosing   Plan:  1)  Lovenox 90 mg sq Q 12 hours 2) Coumadin 7.5 mg po daily at 1800 pm 3) Daily INR 4) Begin Coumadin education 5) Follow up CBC, Scr  Elwin Sleight 10/24/2011,11:03 AM

## 2011-10-24 NOTE — Progress Notes (Signed)
*  PRELIMINARY RESULTS* Vascular Ultrasound Lower extremity venous duplex has been completed.  Preliminary findings: Right= Evidence of deep vein thrombus involving the popliteal, peroneal, and posterior tibial veins. Left= No evidence of deep vein thrombus.  Farrel Demark RDMS 10/24/2011, 9:17 AM

## 2011-10-24 NOTE — ED Notes (Signed)
Awaiting return call back from Amy in house coverage re: options on pt viewing of Coumadin video

## 2011-10-24 NOTE — ED Notes (Signed)
Verified with Hunt, PA that pt received all meds ordered prior to discharge

## 2011-10-24 NOTE — Discharge Instructions (Signed)
It is very important to take both Lovenox and Coumadin as instructed. It is also very important to call your primary care physician's office first thing in the morning at time of opening to schedule close followup for INR checks. An INR check is a blood test that monitors be thinning of your blood. Routine INR checks are very important to make sure your blood does not become too thin while taking medications. You need to be seen tomorrow for an INR check. Return to emergency department for any changing or worsening symptoms. Take tylenol #3 as needed for pain but do not drive or operate machinery with use. You may need an over the counter stool softener with use. Take with food on stomach.   Deep Vein Thrombosis A deep vein thrombosis (DVT) is a blood clot (thrombus) that develops in a deep vein. A DVT is a clot in the deep, larger veins of the leg, arm, or pelvis. These are more dangerous than clots that might form in veins on the surface of the body. Deep vein thrombosis can lead to complications if the clot breaks off and travels in the bloodstream to the lungs. CAUSES Blood clots form in a vein for different reasons. Usually several things cause blood clots. They include:  The flow of blood slows down.   The inside of the vein is damaged in some way.   The person has a condition that makes blood clot more easily. These conditions may include:   Older age (especially over 66 years old).   Having a history of blood clots.   Having major or lengthy surgery. Hip surgery is particularly high-risk.   Breaking a hip or leg.   Sitting or lying still for a long time.   Cancer or cancer treatment.   Having a long, thin tube (catheter) placed inside a vein during a medical procedure.   Being overweight (obese).   Pregnancy and childbirth.   Medicines with estrogen.   Smoking.   Other circulation or heart problems.  SYMPTOMS When a clot forms, it can either partially or totally block the  blood flow in that vein. Symptoms of a DVT can include:  Swelling of the leg or arm, especially if one side is much worse.   Warmth and redness of the leg or arm, especially if one side is much worse.   Pain in an arm or leg. If the clot is in the leg, symptoms may be more noticeable or worse when standing or walking.  If the blood clot travels to the lung, it may cause:  Shortness of breath.   Chest pain. The pain may be worsened by deep breaths.   Coughing up thick mucus (phlegm), possibly flecked with blood.  Anyone with these symptoms should get emergency medical treatment right away. Call your local emergency services (911 in [place] if you have these symptoms. DIAGNOSIS If a DVT is suspected, your caregiver will take a full medical history. He or she will also perform a physical exam. Tests that also may be required include:  Studies of the clotting properties of the blood.   An ultrasound scan.   X-rays to show the flow of blood when special dye is injected into the veins (venography).   Studies of your lungs if you have any chest symptoms.  PREVENTION  Exercise the legs regularly. Take a brisk 30 minute walk every day.   Maintain a weight that is appropriate for your height.   Avoid sitting or lying in  bed for long periods of time without moving your legs.   Women, particularly those over the age of 19, should consider the risks and benefits of taking estrogen medicines, including birth control pills.   Do not smoke, especially if you take estrogen medicines.   Long-distance travel can increase your risk. You should exercise your legs by walking or pumping the muscles every hour.   In hospital prevention:   Prevention may include medical and nonmedical measures.  TREATMENT  The most common treatment for DVT is blood thinning (anticoagulant) medicine, which reduces the blood's tendency to clot. Anticoagulants can stop new blood clots from forming and old ones from  growing. They cannot dissolve existing clots. Your body does this by itself over time. Anticoagulants can be given by mouth, by intravenous (IV) access, or by injection. Your caregiver will determine the best program for you.   Less commonly, clot-dissolving drugs (thrombolytics) are used to dissolve a DVT. They carry a high risk of bleeding, so they are used mainly in severe cases.   Very rarely, a blood clot in the leg needs to be removed surgically.   If you are unable to take anticoagulants, your caregiver may arrange for you to have a filter placed in a main vein in your belly (abdomen). This filter prevents clots from traveling to your lungs.  HOME CARE INSTRUCTIONS  Take all medicines prescribed by your caregiver. Follow the directions carefully.   You will most likely continue taking anticoagulants after you leave the hospital. Your caregiver will advise you on the length of treatment (usually 3 to 6 months, sometimes for life).   Taking too much or too little of an anticoagulant is dangerous. While taking this type of medicine, you will need to have regular blood tests to be sure the dose is correct. The dose can change for many reasons. It is critically important that you take this medicine exactly as prescribed, and that you have blood tests exactly as directed.   Many foods can interfere with anticoagulants. These include foods high in vitamin K, such as spinach, kale, broccoli, cabbage, collard and turnip greens, Brussels sprouts, peas, cauliflower, seaweed, parsley, beef and pork liver, green tea, and soybean oil. Your caregiver should discuss limits on these foods with you or you should arrange a visit with a dietician to answer your questions.   Many medicines can interfere with anticoagulants. You must tell your caregiver about any and all medicines you take. This includes all vitamins and supplements. Be especially cautious with aspirin and anti-inflammatory medicines. Ask your  caregiver before taking these.   Anticoagulants can have side effects, mostly excessive bruising or bleeding. You will need to hold pressure over cuts for longer than usual. Avoid alcoholic drinks or consume only very small amounts while taking this medicine.   If you are taking an anticoagulant:   Wear a medical alert bracelet.   Notify your dentist or other caregivers before procedures.   Avoid contact sports.   Ask your caregiver how soon you can go back to normal activities. Not being active can lead to new clots. Ask for a list of what you should and should not do.   Exercise your lower leg muscles. This is important while traveling.   You may need to wear compression stockings. These are tight elastic stockings that apply pressure to the lower legs. This can help keep the blood in the legs from clotting.   If you are a smoker, you should quit.  Learn as much as you can about DVT.  SEEK MEDICAL CARE IF:  You have unusual bruising or any bleeding problems.   The swelling or pain in your affected arm or leg is not gradually improving.   You anticipate surgery or long-distance travel. You should get specific advice on DVT prevention.   You discover other family members with blood clots. This may require further testing for inherited diseases or conditions.  SEEK IMMEDIATE MEDICAL CARE IF:  You develop chest pain.   You develop severe shortness of breath.   You begin to cough up bloody mucus or phlegm (sputum).   You feel dizzy or faint.   You develop swelling or pain in the leg.   You have breathing problems after traveling.  MAKE SURE YOU:  Understand these instructions.   Will watch your condition.   Will get help right away if you are not doing well or get worse.  Document Released: 07/12/2005 Document Revised: 07/01/2011 Document Reviewed: 09/03/2010 [facility] Patient Information 2012 [place], [place].

## 2011-10-25 ENCOUNTER — Ambulatory Visit (INDEPENDENT_AMBULATORY_CARE_PROVIDER_SITE_OTHER): Payer: Medicare Other | Admitting: Internal Medicine

## 2011-10-25 ENCOUNTER — Encounter: Payer: Self-pay | Admitting: Internal Medicine

## 2011-10-25 VITALS — BP 110/80 | Temp 98.1°F | Wt 201.0 lb

## 2011-10-25 DIAGNOSIS — M1990 Unspecified osteoarthritis, unspecified site: Secondary | ICD-10-CM

## 2011-10-25 DIAGNOSIS — M546 Pain in thoracic spine: Secondary | ICD-10-CM | POA: Diagnosis not present

## 2011-10-25 DIAGNOSIS — F3289 Other specified depressive episodes: Secondary | ICD-10-CM

## 2011-10-25 DIAGNOSIS — F329 Major depressive disorder, single episode, unspecified: Secondary | ICD-10-CM | POA: Diagnosis not present

## 2011-10-25 DIAGNOSIS — Z86718 Personal history of other venous thrombosis and embolism: Secondary | ICD-10-CM

## 2011-10-25 DIAGNOSIS — O2230 Deep phlebothrombosis in pregnancy, unspecified trimester: Secondary | ICD-10-CM

## 2011-10-25 DIAGNOSIS — IMO0001 Reserved for inherently not codable concepts without codable children: Secondary | ICD-10-CM

## 2011-10-25 DIAGNOSIS — I82409 Acute embolism and thrombosis of unspecified deep veins of unspecified lower extremity: Secondary | ICD-10-CM | POA: Diagnosis not present

## 2011-10-25 NOTE — Patient Instructions (Addendum)
Return in 3 days to have your INR checked Continue Coumadin 7.5 mg daily Continue Lovenox injections twice daily  Report any chest pain or shortness of breath Keep right leg elevated as much as possible andWarfarin   Warfarin is a blood thinner (anticoagulant) medicine. It is used to keep clots from forming in your blood. When you take warfarin, you may bruise easily. You may also bleed a little longer if you cut yourself.   Before taking warfarin, tell your doctor if:  You take any other medicine for your heart or blood pressure.   You are pregnant.   You plan to get pregnant.   You are breastfeeding.   You are allergic to any medicine.  HOME CARE  Take warfarin as told by your doctor. Do not stop the medicine unless your doctor tells you to.   Take your medicine at the same time every day.   Do not take anything that has aspirin in it unless your doctor says it is okay.   Do not drink alcohol.   Tell all your doctors and dentists that you are taking warfarin before they treat you or give you any medicines.   Keep all your appointments for doctor visits and blood tests.   Keep warfarin out of reach of children. Do not share warfarin with anyone.   Eat about the same amount of vitamin K foods every day.   High vitamin K foods: Beef liver. Pork liver. Green tea. Broccoli. Brussels sprouts. Cauliflower. Chickpeas. Kale. Spinach. Turnip greens.   Medium vitamin K foods: Chicken liver. Pork tenderloin. Cheddar cheese. Rolled oats. Coffee. Asparagus. Cabbage. Iceberg lettuce.   Low vitamin K foods: Apples. Butter. Bananas. Skim or 1% milk. Orange rice. Canned pears. White bread. Strawberries. Corn. Tomatoes. Green beans. Eggs. Potatoes. Tomasa Blase. Pumpkin. Chicken breasts. Ground beef. Oil (except soybean oil).  GET HELP RIGHT AWAY IF:  You miss a dose of warfarin. Do not take 2 doses at the same time.   You have a skin rash.   You have heavy or unusual bleeding.   There is  blood in your pee (urine) or poop (stool).   You have side effects from medicine that do not get better after a few days.  MAKE SURE YOU:  Understand these instructions.   Will watch your condition.   Will get help right away if you are not doing well or get worse.  Document Released: 08/14/2010 Document Revised: 07/01/2011 Document Reviewed: 08/14/2010 [facility] Patient Information 2012 [place], [place].Deep Vein Thrombosis A deep vein thrombosis (DVT) is a blood clot (thrombus) that develops in a deep vein. A DVT is a clot in the deep, larger veins of the leg, arm, or pelvis. These are more dangerous than clots that might form in veins on the surface of the body. Deep vein thrombosis can lead to complications if the clot breaks off and travels in the bloodstream to the lungs. CAUSES Blood clots form in a vein for different reasons. Usually several things cause blood clots. They include:  The flow of blood slows down.   The inside of the vein is damaged in some way.   The person has a condition that makes blood clot more easily. These conditions may include:   Older age (especially over 55 years old).   Having a history of blood clots.   Having major or lengthy surgery. Hip surgery is particularly high-risk.   Breaking a hip or leg.   Sitting or lying still for a long  time.   Cancer or cancer treatment.   Having a long, thin tube (catheter) placed inside a vein during a medical procedure.   Being overweight (obese).   Pregnancy and childbirth.   Medicines with estrogen.   Smoking.   Other circulation or heart problems.  SYMPTOMS When a clot forms, it can either partially or totally block the blood flow in that vein. Symptoms of a DVT can include:  Swelling of the leg or arm, especially if one side is much worse.   Warmth and redness of the leg or arm, especially if one side is much worse.   Pain in an arm or leg. If the clot is in the leg, symptoms may be more  noticeable or worse when standing or walking.  If the blood clot travels to the lung, it may cause:  Shortness of breath.   Chest pain. The pain may be worsened by deep breaths.   Coughing up thick mucus (phlegm), possibly flecked with blood.  Anyone with these symptoms should get emergency medical treatment right away. Call your local emergency services (911 in [place] if you have these symptoms. DIAGNOSIS If a DVT is suspected, your caregiver will take a full medical history. He or she will also perform a physical exam. Tests that also may be required include:  Studies of the clotting properties of the blood.   An ultrasound scan.   X-rays to show the flow of blood when special dye is injected into the veins (venography).   Studies of your lungs if you have any chest symptoms.  PREVENTION  Exercise the legs regularly. Take a brisk 30 minute walk every day.   Maintain a weight that is appropriate for your height.   Avoid sitting or lying in bed for long periods of time without moving your legs.   Women, particularly those over the age of 65, should consider the risks and benefits of taking estrogen medicines, including birth control pills.   Do not smoke, especially if you take estrogen medicines.   Long-distance travel can increase your risk. You should exercise your legs by walking or pumping the muscles every hour.   In hospital prevention:   Prevention may include medical and nonmedical measures.  TREATMENT  The most common treatment for DVT is blood thinning (anticoagulant) medicine, which reduces the blood's tendency to clot. Anticoagulants can stop new blood clots from forming and old ones from growing. They cannot dissolve existing clots. Your body does this by itself over time. Anticoagulants can be given by mouth, by intravenous (IV) access, or by injection. Your caregiver will determine the best program for you.   Less commonly, clot-dissolving drugs (thrombolytics)  are used to dissolve a DVT. They carry a high risk of bleeding, so they are used mainly in severe cases.   Very rarely, a blood clot in the leg needs to be removed surgically.   If you are unable to take anticoagulants, your caregiver may arrange for you to have a filter placed in a main vein in your belly (abdomen). This filter prevents clots from traveling to your lungs.  HOME CARE INSTRUCTIONS  Take all medicines prescribed by your caregiver. Follow the directions carefully.   You will most likely continue taking anticoagulants after you leave the hospital. Your caregiver will advise you on the length of treatment (usually 3 to 6 months, sometimes for life).   Taking too much or too little of an anticoagulant is dangerous. While taking this type of medicine, you  will need to have regular blood tests to be sure the dose is correct. The dose can change for many reasons. It is critically important that you take this medicine exactly as prescribed, and that you have blood tests exactly as directed.   Many foods can interfere with anticoagulants. These include foods high in vitamin K, such as spinach, kale, broccoli, cabbage, collard and turnip greens, Brussels sprouts, peas, cauliflower, seaweed, parsley, beef and pork liver, green tea, and soybean oil. Your caregiver should discuss limits on these foods with you or you should arrange a visit with a dietician to answer your questions.   Many medicines can interfere with anticoagulants. You must tell your caregiver about any and all medicines you take. This includes all vitamins and supplements. Be especially cautious with aspirin and anti-inflammatory medicines. Ask your caregiver before taking these.   Anticoagulants can have side effects, mostly excessive bruising or bleeding. You will need to hold pressure over cuts for longer than usual. Avoid alcoholic drinks or consume only very small amounts while taking this medicine.   If you are taking an  anticoagulant:   Wear a medical alert bracelet.   Notify your dentist or other caregivers before procedures.   Avoid contact sports.   Ask your caregiver how soon you can go back to normal activities. Not being active can lead to new clots. Ask for a list of what you should and should not do.   Exercise your lower leg muscles. This is important while traveling.   You may need to wear compression stockings. These are tight elastic stockings that apply pressure to the lower legs. This can help keep the blood in the legs from clotting.   If you are a smoker, you should quit.   Learn as much as you can about DVT.  SEEK MEDICAL CARE IF:  You have unusual bruising or any bleeding problems.   The swelling or pain in your affected arm or leg is not gradually improving.   You anticipate surgery or long-distance travel. You should get specific advice on DVT prevention.   You discover other family members with blood clots. This may require further testing for inherited diseases or conditions.  SEEK IMMEDIATE MEDICAL CARE IF:  You develop chest pain.   You develop severe shortness of breath.   You begin to cough up bloody mucus or phlegm (sputum).   You feel dizzy or faint.   You develop swelling or pain in the leg.   You have breathing problems after traveling.  MAKE SURE YOU:  Understand these instructions.   Will watch your condition.   Will get help right away if you are not doing well or get worse.  Document Released: 07/12/2005 Document Revised: 07/01/2011 Document Reviewed: 09/03/2010 [facility] Patient Information 2012 [place], [place].

## 2011-10-25 NOTE — ED Provider Notes (Signed)
Medical screening examination/treatment/procedure(s) were conducted as a shared visit with non-physician practitioner(s) and myself.  I personally evaluated the patient during the encounter 61 yo woman seen last night with right calf swelling, Rx with Lovenox, had venous doppler that showed DVT.  Rx with Lovenox, coumadin, close followup with Dr. Caryl Never, her PCP.  Carleene Cooper III, MD 10/25/11 [phone]

## 2011-10-25 NOTE — Progress Notes (Signed)
  Subjective:    Patient ID: Ashley Castaneda, female    DOB: [birth year], 61 y.o.   MRN: [id number]  HPI  61 year old patient who is seen today following a ER visit over the weekend. She has been followed by a number of consultants to to a chronic headaches and chronic pain. She recently was treated with a prednisone taper due to refractory headaches she noted to have increasing lower extremity swelling in the right leg greater than the left. She attributed this to the prednisone therapy. Due to worsening right leg pain she was seen in the ED over the weekend and yesterday morning a venous Doppler examination confirmed a right leg DVT. She has been on Lovenox since Saturday and has had 2 doses of Coumadin anticoagulation. An INR yesterday was normal. She denies any chest pain or shortness of breath. She was given a prescription for a 14 day supply of Lovenox and presently is on 7.5 mg of Coumadin daily    Review of Systems  Constitutional: Negative.   HENT: Negative for hearing loss, congestion, sore throat, rhinorrhea, dental problem, sinus pressure and tinnitus.   Eyes: Negative for pain, discharge and visual disturbance.  Respiratory: Negative for cough and shortness of breath.   Cardiovascular: Negative for chest pain, palpitations and leg swelling.  Gastrointestinal: Negative for nausea, vomiting, abdominal pain, diarrhea, constipation, blood in stool and abdominal distention.  Genitourinary: Negative for dysuria, urgency, frequency, hematuria, flank pain, vaginal bleeding, vaginal discharge, difficulty urinating, vaginal pain and pelvic pain.  Musculoskeletal: Positive for myalgias, back pain and arthralgias. Negative for joint swelling and gait problem.  Skin: Negative for rash.  Neurological: Negative for dizziness, syncope, speech difficulty, weakness, numbness and headaches.  Hematological: Negative for adenopathy.  Psychiatric/Behavioral: Negative for behavioral problems, dysphoric mood and  agitation. The patient is not nervous/anxious.        Objective:   Physical Exam  Constitutional: She appears well-developed and well-nourished. No distress.       Anxious but in no acute distress Blood pressure 120/70 No tachycardia  Cardiovascular: Normal rate, regular rhythm and normal heart sounds.   Pulmonary/Chest: Effort normal and breath sounds normal.       O2 saturation 96  Musculoskeletal: She exhibits edema.       Lower extremity edema right greater than left. Mild right calf tenderness          Assessment & Plan:   Right lower extremity DVT.  We'll continue Lovenox 90 mg twice daily and Coumadin 7.5 mg daily will return in 72 hours for a followup INR she will take 7.5 mg Tuesday and Wednesday and hold her Thursday dosed until an INR has been determined. Chronic pain syndrome Headaches Fibromyalgia

## 2011-10-26 NOTE — Telephone Encounter (Signed)
Opened in error

## 2011-10-28 ENCOUNTER — Ambulatory Visit (INDEPENDENT_AMBULATORY_CARE_PROVIDER_SITE_OTHER): Payer: Medicare Other | Admitting: Family Medicine

## 2011-10-28 ENCOUNTER — Encounter: Payer: Self-pay | Admitting: Family Medicine

## 2011-10-28 VITALS — BP 130/80 | Temp 97.8°F | Wt 200.0 lb

## 2011-10-28 DIAGNOSIS — I82419 Acute embolism and thrombosis of unspecified femoral vein: Secondary | ICD-10-CM

## 2011-10-28 DIAGNOSIS — I824Y9 Acute embolism and thrombosis of unspecified deep veins of unspecified proximal lower extremity: Secondary | ICD-10-CM | POA: Diagnosis not present

## 2011-10-28 MED ORDER — WARFARIN SODIUM 5 MG PO TABS: 5.0000 mg | ORAL_TABLET | Freq: Every day | ORAL | Status: DC

## 2011-10-28 NOTE — Patient Instructions (Signed)
  Latest dosing instructions   Total Sun Mon Tue Wed Thu Fri Sat   35 5 mg 5 mg 5 mg 5 mg 5 mg 5 mg 5 mg    (5 mg1) (5 mg1) (5 mg1) (5 mg1) (5 mg1) (5 mg1) (5 mg1)        

## 2011-10-28 NOTE — Progress Notes (Signed)
Subjective:    Patient ID: Ashley Castaneda, female    DOB: [birth year], 61 y.o.   MRN: [id number]  HPI  Pt here to followup right lower extremity DVT. She's been on Premarin per gynecologist and neurologist. Recently had increase in dosage to 0.9 mg. Ongoing nicotine use. No prior history of thrombotic disorder. She has mild to moderate right posterior knee pain. Mild edema right lower extremity overall improved. Has been on Lovenox and was started 4 days ago on Coumadin 7.5 mg. INR 2.7 today. No recent bleeding problems. Denies any dyspnea or pleuritic pain.  No recent appetite or weight changes.  No hx of malignancy.  Past Medical History  Diagnosis Date  . Unspecified vitamin D deficiency 03/31/2010  . ANXIETY 07/01/2008  . DEPRESSION 07/01/2008  . REFLEX SYMPATHETIC DYSTROPHY 07/01/2008  . Trigeminal neuralgia 05/21/2009  . Other specified trigeminal nerve disorders 07/01/2008  . ALLERGIC RHINITIS 07/01/2008  . BRONCHITIS, CHRONIC 07/01/2008  . GERD 07/01/2008  . ECZEMA 05/29/2009  . OSTEOARTHRITIS 07/01/2008  . OCCIPITAL NEURALGIA 07/01/2008  . BACK PAIN, THORACIC REGION 03/04/2010  . FIBROMYALGIA 07/01/2008  . LEG PAIN, BILATERAL 07/01/2008  . FACIAL PAIN 12/25/2009  . CARPAL TUNNEL SYNDROME, HX OF 07/01/2008  . DVT, HX OF 07/01/2008  . MIGRAINES, HX OF 07/01/2008  . CHRONIC OBSTRUCTIVE PULMONARY DISEASE, ACUTE EXACERBATION 08/07/2010   Past Surgical History  Procedure Date  . Lavh     BSO  . Tonsillectomy 1957  . Right foot bone spur 1987  . Left knee surgery 1989  . Right knee surgery 1990  . Right soulder surgery 1992, 1993, 1995, 1999  . Head craniectomy 1994    MICROVASULAR DECOMPRESSION  . Right foot surgery 1998    BAD CUT  . Right jaw surgery 2002  . Right thumb sugery 2004  . Carpal tunnel release 2009    RIGHT    reports that she has been smoking Cigarettes.  She has a 60 pack-year smoking history. She has never used smokeless tobacco. She reports that she does not drink  alcohol or use illicit drugs. family history includes Cancer in her father; Hypertension in her father; Ovarian cancer in her mother; and Prostate cancer in her father. Allergies  Allergen Reactions  . Cefazolin     Ancef - in surgery, swelled up, turned blue, heart problems  . Butrans (Buprenorphine Hcl) Itching  . Baclofen     REACTION: confusion  . Carbamazepine     REACTION: confusion  . Ceclor (Cefaclor)     hives  . Fentanyl     REACTION: nausea and vomiting  . Gabapentin     REACTION: confusion  . Oxcarbazepine     REACTION: confusion      Review of Systems  Respiratory: Negative for cough and shortness of breath.   Cardiovascular: Positive for leg swelling. Negative for chest pain and palpitations.  Gastrointestinal: Negative for blood in stool.  Genitourinary: Negative for hematuria.       Objective:   Physical Exam  Constitutional: She appears well-developed and well-nourished.  Cardiovascular: Normal rate and regular rhythm.   Pulmonary/Chest: Effort normal and breath sounds normal. No respiratory distress. She has no wheezes. She has no rales.  Musculoskeletal: She exhibits edema.          Assessment & Plan:  DVT right lower extremity. INR 2.7. Discontinue Lovenox. Reduce Coumadin to 5 mg daily and repeat INR in 4 days. Patient encouraged to stop smoking. We need to gradually reduce her  Premarin and eventually get off

## 2011-10-28 NOTE — Patient Instructions (Signed)
Stop Lovenox at this time. Quit smoking. Discuss with gyn options to Premarin. Reduce coumadin to 5 mg daily and repeat INR in 4 days

## 2011-10-29 ENCOUNTER — Encounter: Payer: Self-pay | Admitting: Family Medicine

## 2011-11-01 ENCOUNTER — Ambulatory Visit (INDEPENDENT_AMBULATORY_CARE_PROVIDER_SITE_OTHER): Payer: Medicare Other | Admitting: Family Medicine

## 2011-11-01 DIAGNOSIS — I824Y9 Acute embolism and thrombosis of unspecified deep veins of unspecified proximal lower extremity: Secondary | ICD-10-CM | POA: Diagnosis not present

## 2011-11-01 DIAGNOSIS — I82419 Acute embolism and thrombosis of unspecified femoral vein: Secondary | ICD-10-CM

## 2011-11-01 NOTE — Patient Instructions (Signed)
  Latest dosing instructions   Total Sun Mon Tue Wed Thu Fri Sat   35 5 mg 5 mg 5 mg 5 mg 5 mg 5 mg 5 mg    (5 mg1) (5 mg1) (5 mg1) (5 mg1) (5 mg1) (5 mg1) (5 mg1)        

## 2011-11-05 ENCOUNTER — Ambulatory Visit (INDEPENDENT_AMBULATORY_CARE_PROVIDER_SITE_OTHER): Payer: Medicare Other | Admitting: Family Medicine

## 2011-11-05 DIAGNOSIS — I82419 Acute embolism and thrombosis of unspecified femoral vein: Secondary | ICD-10-CM

## 2011-11-05 DIAGNOSIS — I824Y9 Acute embolism and thrombosis of unspecified deep veins of unspecified proximal lower extremity: Secondary | ICD-10-CM | POA: Diagnosis not present

## 2011-11-05 NOTE — Patient Instructions (Signed)
  Latest dosing instructions   Total Sun Mon Tue Wed Thu Fri Sat   35 5 mg 5 mg 5 mg 5 mg 5 mg 5 mg 5 mg    (5 mg1) (5 mg1) (5 mg1) (5 mg1) (5 mg1) (5 mg1) (5 mg1)        

## 2011-11-08 ENCOUNTER — Telehealth: Payer: Self-pay | Admitting: *Deleted

## 2011-11-08 NOTE — Telephone Encounter (Signed)
Pt was seen in ER on 10/23/11 for leg pain which was told to be a blood clot, on 10/24/11 pt had doppler test done to confirm. Pt is now taking coumadin 5 mg to help with this. Pt said that Dr. Caryl Never is worried about pt being premarin 0.9 mg? Pt would said that she will follow up with you regarding this. I offered OV to pt and was told to check with you. Pt said if you are going to change her dose of premarin to mail a rx to her and she will send to her mail order pharmacy. Please advise

## 2011-11-08 NOTE — Telephone Encounter (Signed)
Pt informed with the below note. 

## 2011-11-08 NOTE — Telephone Encounter (Signed)
Needs office visit to discuss. Estrogen replacement is contraindicated in patients with DVT. She needs to stop her estrogen now.

## 2011-11-10 ENCOUNTER — Ambulatory Visit (INDEPENDENT_AMBULATORY_CARE_PROVIDER_SITE_OTHER): Payer: Medicare Other | Admitting: Gynecology

## 2011-11-10 ENCOUNTER — Encounter: Payer: Self-pay | Admitting: Gynecology

## 2011-11-10 DIAGNOSIS — Z79890 Hormone replacement therapy (postmenopausal): Secondary | ICD-10-CM | POA: Diagnosis not present

## 2011-11-10 DIAGNOSIS — N951 Menopausal and female climacteric states: Secondary | ICD-10-CM

## 2011-11-10 MED ORDER — FLUCONAZOLE 150 MG PO TABS: ORAL_TABLET | ORAL | Status: DC

## 2011-11-10 NOTE — Progress Notes (Signed)
Patient presents having recently been diagnosed with a right DVT and wanted to discuss her ERT. She has been on ERT for number of years for hot flash relief. She has tried weaning unsuccessfully in the past. She also had exacerbations of her migraines when she tried weaning. We had discussed the issues of transdermal but she elected for oral. She does give a history of DVT in 2009 but on questioning she said it was a superficial thrombophlebitis and did not require anticoagulation and this was a history that she had not given [place] previously. She is having hot flashes now since stopping her ERT and an exacerbation of her migraines.  We have discussed on multiple occasions over the years the risk of ERT to include stroke heart attack DVT as well as possible breast cancer risks and I reviewed with her now I think given the acute nature of the DVT that continuing her ERT is not wise and I would suggest stopping her ERT. Alternatives such as Effexor reviewed although apparently she had tried this before and had a lot of side effects and does not want to try this. Soy based products were also reviewed with her. After lengthy discussion she is to stay off of ERT and will see how she does.  She is on a new migraine regimen to include daily doxycycline and she notes since starting the she's had some vaginal pruritus. I discussed various strategies to help prevent or deal with recurrent yeast infections and ultimately we decided on trial of Diflucan 150 mg every other week over the next several months. I prescribed #4 tabs to be taken one by mouth every other week with several refills. She'll follow up if this does not help with her symptoms.

## 2011-11-10 NOTE — Patient Instructions (Signed)
Follow up if menopausal symptoms continue to be significant. Otherwise follow up in the fall when you're due for your annual exam.

## 2011-11-11 DIAGNOSIS — Z79899 Other long term (current) drug therapy: Secondary | ICD-10-CM | POA: Diagnosis not present

## 2011-11-11 DIAGNOSIS — Z5181 Encounter for therapeutic drug level monitoring: Secondary | ICD-10-CM | POA: Diagnosis not present

## 2011-11-11 DIAGNOSIS — R51 Headache: Secondary | ICD-10-CM | POA: Diagnosis not present

## 2011-11-11 DIAGNOSIS — G894 Chronic pain syndrome: Secondary | ICD-10-CM | POA: Diagnosis not present

## 2011-11-11 DIAGNOSIS — M79609 Pain in unspecified limb: Secondary | ICD-10-CM | POA: Diagnosis not present

## 2011-11-12 DIAGNOSIS — M531 Cervicobrachial syndrome: Secondary | ICD-10-CM | POA: Diagnosis not present

## 2011-11-12 DIAGNOSIS — IMO0002 Reserved for concepts with insufficient information to code with codable children: Secondary | ICD-10-CM | POA: Diagnosis not present

## 2011-11-12 DIAGNOSIS — G500 Trigeminal neuralgia: Secondary | ICD-10-CM | POA: Diagnosis not present

## 2011-11-12 DIAGNOSIS — G43719 Chronic migraine without aura, intractable, without status migrainosus: Secondary | ICD-10-CM | POA: Diagnosis not present

## 2011-11-17 ENCOUNTER — Ambulatory Visit (INDEPENDENT_AMBULATORY_CARE_PROVIDER_SITE_OTHER): Payer: Medicare Other | Admitting: Family Medicine

## 2011-11-17 ENCOUNTER — Encounter: Payer: Self-pay | Admitting: Family Medicine

## 2011-11-17 VITALS — BP 124/72 | Temp 97.6°F | Wt 200.0 lb

## 2011-11-17 DIAGNOSIS — I824Y9 Acute embolism and thrombosis of unspecified deep veins of unspecified proximal lower extremity: Secondary | ICD-10-CM

## 2011-11-17 DIAGNOSIS — F411 Generalized anxiety disorder: Secondary | ICD-10-CM | POA: Diagnosis not present

## 2011-11-17 DIAGNOSIS — R609 Edema, unspecified: Secondary | ICD-10-CM

## 2011-11-17 DIAGNOSIS — R600 Localized edema: Secondary | ICD-10-CM

## 2011-11-17 DIAGNOSIS — I82419 Acute embolism and thrombosis of unspecified femoral vein: Secondary | ICD-10-CM

## 2011-11-17 LAB — POCT INR: INR: 4.1

## 2011-11-17 MED ORDER — ALPRAZOLAM 0.5 MG PO TABS: ORAL_TABLET | ORAL | Status: DC

## 2011-11-17 NOTE — Patient Instructions (Signed)
HOLD coumadin for today then go to one half (2.5 mg) Mondays and Fridays and one tablet (5mg ) all other days Repeat protime in 1 week. Consider Lasix two tablets daily for the next 3-4 days then try to drop back.

## 2011-11-17 NOTE — Progress Notes (Signed)
Subjective:    Patient ID: Ashley Castaneda, female    DOB: 1950-11-10, 61 y.o.   MRN: [id number]  HPI  Patient seen with bilateral leg edema. Somewhat chronic and intermittent but worse over the past several days. Recent DVT right lower extremity. No dietary change. No dyspnea. Edema somewhat worse late in the day. Already takes furosemide 20 mg daily. Intolerant of compression garments in the past. Denies orthopnea. No weight change.  Recent DVT. On Coumadin with no bleeding complications. Premarin discontinued per gynecologist. She continues to smoke. Minimal right leg pain and has some discomfort bilaterally. She complains of some tingling symptoms legs and upper extremities since Lovenox and saw this as a listed potential side effect. No weakness.  She has chronic anxiety is still very anxious past several days. Currently takes alprazolam 0.5 mg one half tablet in the morning and one at night. She is inquiring about taking an extra half tablet around mid day. No increased depressive symptoms.  Past Medical History  Diagnosis Date  . Unspecified vitamin D deficiency 03/31/2010  . ANXIETY 07/01/2008  . DEPRESSION 07/01/2008  . REFLEX SYMPATHETIC DYSTROPHY 07/01/2008  . Trigeminal neuralgia 05/21/2009  . Other specified trigeminal nerve disorders 07/01/2008  . ALLERGIC RHINITIS 07/01/2008  . BRONCHITIS, CHRONIC 07/01/2008  . GERD 07/01/2008  . ECZEMA 05/29/2009  . OSTEOARTHRITIS 07/01/2008  . OCCIPITAL NEURALGIA 07/01/2008  . BACK PAIN, THORACIC REGION 03/04/2010  . FIBROMYALGIA 07/01/2008  . LEG PAIN, BILATERAL 07/01/2008  . FACIAL PAIN 12/25/2009  . CARPAL TUNNEL SYNDROME, HX OF 07/01/2008  . DVT, HX OF 07/01/2008  . MIGRAINES, HX OF 07/01/2008  . CHRONIC OBSTRUCTIVE PULMONARY DISEASE, ACUTE EXACERBATION 08/07/2010   Past Surgical History  Procedure Date  . Lavh     BSO  . Tonsillectomy 1957  . Right foot bone spur 1987  . Left knee surgery 1989  . Right knee surgery 1990  . Right soulder  surgery 1992, 1993, 1995, 1999  . Head craniectomy 1994    MICROVASULAR DECOMPRESSION  . Right foot surgery 1998    BAD CUT  . Right jaw surgery 2002  . Right thumb sugery 2004  . Carpal tunnel release 2009    RIGHT    reports that she has been smoking Cigarettes.  She has a 60 pack-year smoking history. She has never used smokeless tobacco. She reports that she does not drink alcohol or use illicit drugs. family history includes Cancer in her father; Hypertension in her father; Ovarian cancer in her mother; and Prostate cancer in her father. Allergies  Allergen Reactions  . Cefazolin     Ancef - in surgery, swelled up, turned blue, heart problems  . Baclofen     REACTION: confusion  . Carbamazepine     REACTION: confusion  . Ceclor (Cefaclor)     hives  . Fentanyl     REACTION: nausea and vomiting  . Gabapentin     REACTION: confusion  . Oxcarbazepine     REACTION: confusion      Review of Systems  Constitutional: Positive for fatigue.  Respiratory: Negative for cough and shortness of breath.   Cardiovascular: Positive for leg swelling. Negative for chest pain and palpitations.  Gastrointestinal: Negative for abdominal pain.  Skin: Negative for rash.  Neurological: Negative for dizziness and syncope.  Hematological: Negative for adenopathy. Does not bruise/bleed easily.  Psychiatric/Behavioral: Negative for dysphoric mood. The patient is nervous/anxious.        Objective:   Physical Exam  Constitutional:  She is oriented to person, place, and time. She appears well-developed and well-nourished.  Neck: Neck supple.  Cardiovascular: Normal rate and regular rhythm.   Pulmonary/Chest: Effort normal and breath sounds normal. No respiratory distress. She has no wheezes. She has no rales.  Musculoskeletal: She exhibits edema.       She has trace nonpitting edema of the legs bilaterally  Neurological: She is alert and oriented to person, place, and time. No cranial nerve  deficit.  Psychiatric: She has a normal mood and affect. Her behavior is normal.          Assessment & Plan:  #1 bilateral leg edema. Suspect multifactorial. Probably largely related to venous stasis. Frequent leg elevation and increase furosemide to 40 mg daily over the next few days. Intolerant of support hose in the past #2 history of recent DVT. INR today 4.1. Hold Coumadin today then decrease to 2.5 mg Monday and Friday and 5 mg all other days and recheck in 1 week #3 history of chronic anxiety. Increase alprazolam 0.5 mg one half tablet in the morning one half tablet around noon and one tablet at night. Discussed longer acting medication such as clonazepam but she has taken in the past without success

## 2011-11-19 ENCOUNTER — Ambulatory Visit: Payer: Medicare Other

## 2011-11-24 ENCOUNTER — Other Ambulatory Visit: Payer: Self-pay | Admitting: *Deleted

## 2011-11-24 MED ORDER — WARFARIN SODIUM 5 MG PO TABS: 5.0000 mg | ORAL_TABLET | Freq: Every day | ORAL | Status: DC

## 2011-11-25 ENCOUNTER — Ambulatory Visit: Payer: Medicare Other

## 2011-11-25 DIAGNOSIS — I82419 Acute embolism and thrombosis of unspecified femoral vein: Secondary | ICD-10-CM

## 2011-11-25 LAB — POCT INR: INR: 1.6

## 2011-11-25 NOTE — Patient Instructions (Signed)
  Latest dosing instructions   Total Sun Mon Tue Wed Thu Fri Sat   32.5 5 mg 2.5 mg 5 mg 5 mg 5 mg 5 mg 5 mg    (5 mg1) (5 mg0.5) (5 mg1) (5 mg1) (5 mg1) (5 mg1) (5 mg1)         

## 2011-12-02 DIAGNOSIS — M531 Cervicobrachial syndrome: Secondary | ICD-10-CM | POA: Diagnosis not present

## 2011-12-02 DIAGNOSIS — R51 Headache: Secondary | ICD-10-CM | POA: Diagnosis not present

## 2011-12-02 DIAGNOSIS — M542 Cervicalgia: Secondary | ICD-10-CM | POA: Diagnosis not present

## 2011-12-02 DIAGNOSIS — M545 Low back pain: Secondary | ICD-10-CM | POA: Diagnosis not present

## 2011-12-03 DIAGNOSIS — M542 Cervicalgia: Secondary | ICD-10-CM | POA: Diagnosis not present

## 2011-12-03 DIAGNOSIS — IMO0002 Reserved for concepts with insufficient information to code with codable children: Secondary | ICD-10-CM | POA: Diagnosis not present

## 2011-12-03 DIAGNOSIS — G43719 Chronic migraine without aura, intractable, without status migrainosus: Secondary | ICD-10-CM | POA: Diagnosis not present

## 2011-12-03 DIAGNOSIS — IMO0001 Reserved for inherently not codable concepts without codable children: Secondary | ICD-10-CM | POA: Diagnosis not present

## 2011-12-07 ENCOUNTER — Telehealth: Payer: Self-pay | Admitting: Family Medicine

## 2011-12-07 NOTE — Telephone Encounter (Signed)
Pt is sch for radio frequency to neck on 12-22-2011. Dr Eloisa Northern [phone] at [facility] in [place] would like patient to stop coumadin 7 days prior to procedure. Please advise

## 2011-12-08 ENCOUNTER — Ambulatory Visit (INDEPENDENT_AMBULATORY_CARE_PROVIDER_SITE_OTHER): Payer: Medicare Other | Admitting: Family

## 2011-12-08 DIAGNOSIS — I824Y9 Acute embolism and thrombosis of unspecified deep veins of unspecified proximal lower extremity: Secondary | ICD-10-CM

## 2011-12-08 DIAGNOSIS — I82419 Acute embolism and thrombosis of unspecified femoral vein: Secondary | ICD-10-CM

## 2011-12-08 NOTE — Telephone Encounter (Signed)
Pt informed.  Pt agrees and plans to cancel this procedure, more concerned about her leg currently.  She will follow-up with you in a few weeks. FYI

## 2011-12-08 NOTE — Telephone Encounter (Signed)
We would need to transition to Lovenox ( would not be safe to stop coumadin all together with recent DVT).  Send back to me and I will calculate her Lovenox dose

## 2011-12-08 NOTE — Patient Instructions (Signed)
  Latest dosing instructions   Total Sun Mon Tue Wed Thu Fri Sat   35 5 mg 5 mg 5 mg 5 mg 5 mg 5 mg 5 mg    (5 mg1) (5 mg1) (5 mg1) (5 mg1) (5 mg1) (5 mg1) (5 mg1)        

## 2011-12-21 ENCOUNTER — Ambulatory Visit: Payer: Medicare Other | Admitting: Family Medicine

## 2011-12-22 ENCOUNTER — Ambulatory Visit: Payer: Medicare Other | Admitting: Family

## 2011-12-24 ENCOUNTER — Ambulatory Visit (INDEPENDENT_AMBULATORY_CARE_PROVIDER_SITE_OTHER): Payer: Medicare Other | Admitting: Family

## 2011-12-24 ENCOUNTER — Encounter: Payer: Self-pay | Admitting: Family Medicine

## 2011-12-24 ENCOUNTER — Ambulatory Visit (INDEPENDENT_AMBULATORY_CARE_PROVIDER_SITE_OTHER): Payer: Medicare Other | Admitting: Family Medicine

## 2011-12-24 VITALS — BP 120/80 | Temp 97.8°F | Wt 183.0 lb

## 2011-12-24 DIAGNOSIS — I82419 Acute embolism and thrombosis of unspecified femoral vein: Secondary | ICD-10-CM

## 2011-12-24 DIAGNOSIS — I824Y9 Acute embolism and thrombosis of unspecified deep veins of unspecified proximal lower extremity: Secondary | ICD-10-CM | POA: Diagnosis not present

## 2011-12-24 DIAGNOSIS — R609 Edema, unspecified: Secondary | ICD-10-CM

## 2011-12-24 DIAGNOSIS — R600 Localized edema: Secondary | ICD-10-CM

## 2011-12-24 DIAGNOSIS — I82409 Acute embolism and thrombosis of unspecified deep veins of unspecified lower extremity: Secondary | ICD-10-CM | POA: Diagnosis not present

## 2011-12-24 DIAGNOSIS — I82401 Acute embolism and thrombosis of unspecified deep veins of right lower extremity: Secondary | ICD-10-CM

## 2011-12-24 DIAGNOSIS — E876 Hypokalemia: Secondary | ICD-10-CM | POA: Diagnosis not present

## 2011-12-24 LAB — BASIC METABOLIC PANEL
Calcium: 9.3 mg/dL (ref 8.4–10.5)
GFR: 92.24 mL/min (ref >60.00)
Sodium: 142 mEq/L (ref 135–145)

## 2011-12-24 LAB — POCT INR: INR: 1.8

## 2011-12-24 MED ORDER — WARFARIN SODIUM 5 MG PO TABS: 7.5000 mg | ORAL_TABLET | Freq: Every day | ORAL | Status: DC

## 2011-12-24 NOTE — Progress Notes (Signed)
Subjective:    Patient ID: Ashley Castaneda, female    DOB: [birth year], 61 y.o.   MRN: [id number]  HPI  Patient seen for followup regarding some right lower extremity pain. History recent DVT. Compliant with Coumadin. INR today 1.8. No bleeding complications except small bruise right leg. Edema has improved since last visit. Weight is down from 200 pounds to 183 pounds. She is not taking any Lasix past couple days. She been taking Lasix 20 mg twice a day until couple days ago. Overall feels better.  Past Medical History  Diagnosis Date  . Unspecified vitamin D deficiency 03/31/2010  . ANXIETY 07/01/2008  . DEPRESSION 07/01/2008  . REFLEX SYMPATHETIC DYSTROPHY 07/01/2008  . Trigeminal neuralgia 05/21/2009  . Other specified trigeminal nerve disorders 07/01/2008  . ALLERGIC RHINITIS 07/01/2008  . BRONCHITIS, CHRONIC 07/01/2008  . GERD 07/01/2008  . ECZEMA 05/29/2009  . OSTEOARTHRITIS 07/01/2008  . OCCIPITAL NEURALGIA 07/01/2008  . BACK PAIN, THORACIC REGION 03/04/2010  . FIBROMYALGIA 07/01/2008  . LEG PAIN, BILATERAL 07/01/2008  . FACIAL PAIN 12/25/2009  . CARPAL TUNNEL SYNDROME, HX OF 07/01/2008  . DVT, HX OF 07/01/2008  . MIGRAINES, HX OF 07/01/2008  . CHRONIC OBSTRUCTIVE PULMONARY DISEASE, ACUTE EXACERBATION 08/07/2010   Past Surgical History  Procedure Date  . Lavh     BSO  . Tonsillectomy 1957  . Right foot bone spur 1987  . Left knee surgery 1989  . Right knee surgery 1990  . Right soulder surgery 1992, 1993, 1995, 1999  . Head craniectomy 1994    MICROVASULAR DECOMPRESSION  . Right foot surgery 1998    BAD CUT  . Right jaw surgery 2002  . Right thumb sugery 2004  . Carpal tunnel release 2009    RIGHT    reports that she has been smoking Cigarettes.  She has a 60 pack-year smoking history. She has never used smokeless tobacco. She reports that she does not drink alcohol or use illicit drugs. family history includes Cancer in her father; Hypertension in her father; Ovarian cancer in her  mother; and Prostate cancer in her father. Allergies  Allergen Reactions  . Cefazolin     Ancef - in surgery, swelled up, turned blue, heart problems  . Baclofen     REACTION: confusion  . Carbamazepine     REACTION: confusion  . Ceclor (Cefaclor)     hives  . Fentanyl     REACTION: nausea and vomiting  . Gabapentin     REACTION: confusion  . Oxcarbazepine     REACTION: confusion     Review of Systems  Constitutional: Negative for fever, chills and unexpected weight change.  Respiratory: Negative for cough and shortness of breath.   Cardiovascular: Negative for chest pain.  Gastrointestinal: Negative for blood in stool.  Genitourinary: Negative for hematuria.  Musculoskeletal: Positive for back pain (chronic).  Neurological: Negative for dizziness and syncope.  Hematological: Does not bruise/bleed easily.       Objective:   Physical Exam  Constitutional: She is oriented to person, place, and time. She appears well-developed and well-nourished.  Cardiovascular: Normal rate and regular rhythm.   Pulmonary/Chest: Breath sounds normal. No respiratory distress. She has no wheezes. She has no rales.  Musculoskeletal:       No pitting edema. Couple of very small bruises right calf. No calf pain  Neurological: She is alert and oriented to person, place, and time.          Assessment & Plan:  #1 bilateral  leg edema improved. Furosemide as needed. Recheck basic metabolic panel #2 history of hypokalemia with previous potassium 3.1. #3 recent DVT. INR 1.8. Adjustment in Coumadin made and reassess one week

## 2011-12-28 NOTE — Progress Notes (Signed)
Quick Note:  Pt informed ______ 

## 2012-01-03 ENCOUNTER — Ambulatory Visit (INDEPENDENT_AMBULATORY_CARE_PROVIDER_SITE_OTHER): Payer: Medicare Other | Admitting: Family

## 2012-01-03 DIAGNOSIS — I824Y9 Acute embolism and thrombosis of unspecified deep veins of unspecified proximal lower extremity: Secondary | ICD-10-CM

## 2012-01-03 DIAGNOSIS — I82419 Acute embolism and thrombosis of unspecified femoral vein: Secondary | ICD-10-CM

## 2012-01-03 LAB — POCT INR: INR: 1.7

## 2012-01-03 NOTE — Patient Instructions (Addendum)
  Latest dosing instructions   Total Ashley Castaneda Tue Wed Thu Fri Sat   40 5 mg 5 mg 7.5 mg 5 mg 7.5 mg 5 mg 5 mg    (5 mg1) (5 mg1) (5 mg1.5) (5 mg1) (5 mg1.5) (5 mg1) (5 mg1)         7.5mg  Tuesday and Thursday.  5mg  all other days. Recheck in 2 weeks.

## 2012-01-12 ENCOUNTER — Encounter (HOSPITAL_COMMUNITY): Payer: Self-pay | Admitting: *Deleted

## 2012-01-12 ENCOUNTER — Emergency Department (HOSPITAL_COMMUNITY)
Admission: EM | Admit: 2012-01-12 | Discharge: 2012-01-12 | Disposition: A | Discharge status: Home / Self Care | Payer: Medicare Other | Attending: Emergency Medicine | Admitting: Emergency Medicine

## 2012-01-12 ENCOUNTER — Telehealth: Payer: Self-pay | Admitting: Family Medicine

## 2012-01-12 DIAGNOSIS — M79609 Pain in unspecified limb: Secondary | ICD-10-CM | POA: Diagnosis not present

## 2012-01-12 DIAGNOSIS — IMO0001 Reserved for inherently not codable concepts without codable children: Secondary | ICD-10-CM | POA: Insufficient documentation

## 2012-01-12 DIAGNOSIS — Z7901 Long term (current) use of anticoagulants: Secondary | ICD-10-CM | POA: Insufficient documentation

## 2012-01-12 DIAGNOSIS — I825Y9 Chronic embolism and thrombosis of unspecified deep veins of unspecified proximal lower extremity: Secondary | ICD-10-CM | POA: Insufficient documentation

## 2012-01-12 DIAGNOSIS — I82419 Acute embolism and thrombosis of unspecified femoral vein: Secondary | ICD-10-CM

## 2012-01-12 DIAGNOSIS — K2990 Gastroduodenitis, unspecified, without bleeding: Secondary | ICD-10-CM | POA: Diagnosis not present

## 2012-01-12 DIAGNOSIS — F411 Generalized anxiety disorder: Secondary | ICD-10-CM | POA: Insufficient documentation

## 2012-01-12 DIAGNOSIS — R0602 Shortness of breath: Secondary | ICD-10-CM | POA: Diagnosis not present

## 2012-01-12 DIAGNOSIS — G9050 Complex regional pain syndrome I, unspecified: Secondary | ICD-10-CM | POA: Diagnosis not present

## 2012-01-12 DIAGNOSIS — F17200 Nicotine dependence, unspecified, uncomplicated: Secondary | ICD-10-CM | POA: Insufficient documentation

## 2012-01-12 DIAGNOSIS — F3289 Other specified depressive episodes: Secondary | ICD-10-CM | POA: Insufficient documentation

## 2012-01-12 DIAGNOSIS — I824Y9 Acute embolism and thrombosis of unspecified deep veins of unspecified proximal lower extremity: Secondary | ICD-10-CM | POA: Diagnosis not present

## 2012-01-12 DIAGNOSIS — F329 Major depressive disorder, single episode, unspecified: Secondary | ICD-10-CM | POA: Diagnosis not present

## 2012-01-12 DIAGNOSIS — J449 Chronic obstructive pulmonary disease, unspecified: Secondary | ICD-10-CM | POA: Insufficient documentation

## 2012-01-12 DIAGNOSIS — M7989 Other specified soft tissue disorders: Secondary | ICD-10-CM | POA: Diagnosis not present

## 2012-01-12 DIAGNOSIS — M25569 Pain in unspecified knee: Secondary | ICD-10-CM | POA: Diagnosis not present

## 2012-01-12 DIAGNOSIS — J4489 Other specified chronic obstructive pulmonary disease: Secondary | ICD-10-CM | POA: Insufficient documentation

## 2012-01-12 DIAGNOSIS — K219 Gastro-esophageal reflux disease without esophagitis: Secondary | ICD-10-CM | POA: Diagnosis not present

## 2012-01-12 LAB — PROTIME-INR: Prothrombin Time: 21.4 seconds — ABNORMAL HIGH (ref 11.6–15.2)

## 2012-01-12 NOTE — Telephone Encounter (Signed)
Agree with advice

## 2012-01-12 NOTE — ED Provider Notes (Signed)
History     CSN: [id number]  Arrival date & time 01/12/12  1646   First MD Initiated Contact with Patient 01/12/12 1839      Chief Complaint  Patient presents with  . Leg Pain    (Consider location/radiation/quality/duration/timing/severity/associated sxs/prior treatment) Patient is a 61 y.o. female presenting with leg pain. The history is provided by the patient.  Leg Pain  Pertinent negatives include no numbness.   patient's had some pain in her right lower extremity the last few days. She had DVT diagnosed from the knee down approximately 2 and half months ago. She's had trouble controlling her Coumadin level. No chest pain. No abdominal pain. No bruising. She states the pain is worse in the calf, but goes all the way up to her groin. Her swelling comes and goes. This was worse on the right lower leg. She states the swelling improved somewhat with elevation. No trauma.  Past Medical History  Diagnosis Date  . Unspecified vitamin D deficiency 03/31/2010  . ANXIETY 07/01/2008  . DEPRESSION 07/01/2008  . REFLEX SYMPATHETIC DYSTROPHY 07/01/2008  . Trigeminal neuralgia 05/21/2009  . Other specified trigeminal nerve disorders 07/01/2008  . ALLERGIC RHINITIS 07/01/2008  . BRONCHITIS, CHRONIC 07/01/2008  . GERD 07/01/2008  . ECZEMA 05/29/2009  . OSTEOARTHRITIS 07/01/2008  . OCCIPITAL NEURALGIA 07/01/2008  . BACK PAIN, THORACIC REGION 03/04/2010  . FIBROMYALGIA 07/01/2008  . LEG PAIN, BILATERAL 07/01/2008  . FACIAL PAIN 12/25/2009  . CARPAL TUNNEL SYNDROME, HX OF 07/01/2008  . DVT, HX OF 07/01/2008  . MIGRAINES, HX OF 07/01/2008  . CHRONIC OBSTRUCTIVE PULMONARY DISEASE, ACUTE EXACERBATION 08/07/2010    Past Surgical History  Procedure Date  . Lavh     BSO  . Tonsillectomy 1957  . Right foot bone spur 1987  . Left knee surgery 1989  . Right knee surgery 1990  . Right soulder surgery 1992, 1993, 1995, 1999  . Head craniectomy 1994    MICROVASULAR DECOMPRESSION  . Right foot surgery 1998   BAD CUT  . Right jaw surgery 2002  . Right thumb sugery 2004  . Carpal tunnel release 2009    RIGHT    Family History  Problem Relation Age of Onset  . Ovarian cancer Mother   . Cancer Mother   . Hypertension Father   . Prostate cancer Father   . Cancer Father     PROSTATE    History  Substance Use Topics  . Smoking status: Current Everyday Smoker -- 2.0 packs/day for 30 years    Types: Cigarettes  . Smokeless tobacco: Never Used  . Alcohol Use: No    OB History    Grav Para Term Preterm Abortions TAB SAB Ect Mult Living   0               Review of Systems  Constitutional: Negative for activity change and appetite change.  HENT: Negative for neck stiffness.   Eyes: Negative for pain.  Respiratory: Negative for chest tightness and shortness of breath.   Cardiovascular: Positive for leg swelling. Negative for chest pain.  Gastrointestinal: Negative for nausea, vomiting, abdominal pain and diarrhea.  Genitourinary: Negative for flank pain.  Musculoskeletal: Negative for back pain.  Skin: Negative for rash.  Neurological: Negative for weakness, numbness and headaches.  Psychiatric/Behavioral: Negative for behavioral problems.    Allergies  Cefazolin; Baclofen; Carbamazepine; Ceclor; Fentanyl; Gabapentin; and Oxcarbazepine  Home Medications   Current Outpatient Rx  Name Route Sig Dispense Refill  . ALPRAZOLAM 0.5 MG  PO TABS  One po tid prn 90 tablet 5  . CALCIUM CARBONATE 1250 MG PO TABS Oral Take 1 tablet by mouth daily.    Marland Kitchen CETIRIZINE HCL 10 MG PO TABS Oral Take 10 mg by mouth daily.      Marland Kitchen CINNAMON 500 MG PO CAPS Oral Take 1,000 mg by mouth daily.     Marland Kitchen DOXYCYCLINE HYCLATE 100 MG PO CAPS Oral Take 100 mg by mouth 2 (two) times daily.    Marland Kitchen FLUCONAZOLE 150 MG PO TABS  Take one pill every other week 4 tablet 4  . FLUTICASONE PROPIONATE 50 MCG/ACT NA SUSP Nasal Place 2 sprays into the nose daily as needed. For nasal congestion    . FROVATRIPTAN SUCCINATE 2.5 MG  PO TABS Oral Take 2.5 mg by mouth as needed. If recurs, may repeat after 2 hours. Max of 3 tabs in 24 hours. For migraines.    . FUROSEMIDE 20 MG PO TABS Oral Take 20 mg by mouth daily as needed. For swelling    . GUAIFENESIN ER 600 MG PO TB12 Oral Take 1,200 mg by mouth 2 (two) times daily.    Marland Kitchen HYDROXYZINE HCL 10 MG PO TABS Oral Take 10 mg by mouth every 8 (eight) hours as needed. For dizziness    . METAXALONE 800 MG PO TABS Oral Take 800 mg by mouth 3 (three) times daily.      Marland Kitchen METOCLOPRAMIDE HCL 10 MG PO TABS Oral Take 10 mg by mouth 3 (three) times daily as needed. For nausea    . MONTELUKAST SODIUM 10 MG PO TABS Oral Take 10 mg by mouth daily at 6 PM.    . ONE-DAILY MULTI VITAMINS PO TABS Oral Take 1 tablet by mouth every morning.     Marland Kitchen FISH OIL 1200 MG PO CAPS Oral Take 1 capsule by mouth every evening.    Marland Kitchen POTASSIUM CHLORIDE ER 10 MEQ PO TBCR Oral Take 10-20 mEq by mouth daily as needed.    Marland Kitchen RABEPRAZOLE SODIUM 20 MG PO TBEC Oral Take 20 mg by mouth every morning.    Marland Kitchen SILODOSIN 8 MG PO CAPS Oral Take 8 mg by mouth daily with breakfast.      . TIZANIDINE HCL 2 MG PO TABS Oral Take 2 mg by mouth at bedtime.    Marland Kitchen VITAMIN E 400 UNITS PO CAPS Oral Take 400 Units by mouth daily at 6 PM.     . WARFARIN SODIUM 5 MG PO TABS Oral Take 1.5 tablets (7.5 mg total) by mouth daily. 45 tablet 3    BP 123/53  Pulse 85  Temp 98.2 F (36.8 C) (Oral)  Resp 16  SpO2 100%  LMP 07/26/1992  Physical Exam  Nursing note and vitals reviewed. Constitutional: She is oriented to person, place, and time. She appears well-developed and well-nourished.  HENT:  Head: Normocephalic and atraumatic.  Eyes: EOM are normal. Pupils are equal, round, and reactive to light.  Neck: Normal range of motion. Neck supple.  Cardiovascular: Normal rate, regular rhythm and normal heart sounds.   No murmur heard. Pulmonary/Chest: Effort normal and breath sounds normal. No respiratory distress. She has no wheezes. She has  no rales.  Abdominal: Soft. Bowel sounds are normal. She exhibits no distension. There is no tenderness. There is no rebound and no guarding.  Musculoskeletal: Normal range of motion. She exhibits edema.       Bilateral lower extremity pitting edema, worse in the right. Tenderness to right calf  medially. Dorsalis pedis pulse intact. Range of motion is intact.  Neurological: She is alert and oriented to person, place, and time. No cranial nerve deficit.  Skin: Skin is warm and dry.  Psychiatric: She has a normal mood and affect. Her speech is normal.    ED Course  Procedures (including critical care time)  Labs Reviewed  PROTIME-INR - Abnormal; Notable for the following:    Prothrombin Time 21.4 (*)     INR 1.82 (*)     All other components within normal limits   No results found.   No diagnosis found.    MDM  Patient with right lower extremity pain. Known history of DVT. Doppler pending. INR is subtherapeutic at 1.82.  Juliet Rude. Rubin Payor, MD 01/12/12 2001

## 2012-01-12 NOTE — Progress Notes (Signed)
VASCULAR LAB PRELIMINARY  PRELIMINARY  PRELIMINARY  PRELIMINARY  Right lower extremity venous duplex completed.    Preliminary report:  Right : Positive for a small chronic DVT in the most distal femoral vein. Also noted is a subacute DVT in the popliteal and proximal posterior tibial veins. There has been no propagation of the DVT noted 10/24/11. There is no evidence of a superficial thrombus or Baker's cyst  Estefanny Moler, RVS 01/12/2012, 9:02 PM

## 2012-01-12 NOTE — ED Notes (Signed)
Venous duplex is ready to receive the patient for her study.  EMT to transport her once she finishes voiding.

## 2012-01-12 NOTE — ED Notes (Signed)
Complaining of pain in right lower leg. States has history of clots in same leg. Will continue to monitor patient

## 2012-01-12 NOTE — ED Provider Notes (Signed)
Care assumed of pt in CDU from Dr. Rubin Payor. Pt with hx DVT to RLE, dxed ~2.85mos ago. C/O increased pain to RLE last evening and today during the day. States she did have an increase in physical activity/standing over the weekend but no known trauma. No meds taken at home. Doppler shows chronic and subacute DVT with no clot extension. Pt's INR is subtherapeutic at 1.82. She is currently taking 7.5mg  Tu/Th and 5 mg other days. Will have her take 7.5 this evening then resume normal schedule. Has an appt to get INR rechecked on Mon. Discussed s/sx that would prompt return visit to ED. Pt verbalized understanding, agreed to plan.  Grant Fontana, PA-C 01/12/12 2351

## 2012-01-12 NOTE — Telephone Encounter (Signed)
Pt/Ashley Castaneda calling and states that she has a HX of DVT to legs. Is having severe right leg pain and is taking her breath away. Onset 2200 last night and has gotten worse. Advised 911 per guidelines. Tingling to right hand and right foot.

## 2012-01-12 NOTE — ED Notes (Signed)
Patient is requesting IV be d/c'd from Left Hand.  States "painful".  IV flushes without difficulty but patient requested IV be removed.  Informed patient that IV may needed to be replaced later and she verbalized understanding of same.

## 2012-01-12 NOTE — ED Notes (Signed)
Report to Damon RN

## 2012-01-12 NOTE — Progress Notes (Signed)
VASCULAR LAB PRELIMINARY  PRELIMINARY  PRELIMINARY  PRELIMINARY  Right lower extremity venous duplex completed.    Preliminary report:  Right:  DVT noted Chronic DVT in the most distal femoral vein, subacute DVT noted in the popliteal and proximal posterior tibial veins. There is no propagation of the DVT noted 10/24/11.  No evidence of superficial thrombosis.  No Baker's cyst.  Rosselyn Martha, RVS 01/12/2012, 9:09 PM

## 2012-01-12 NOTE — ED Notes (Signed)
Hx of DVT in the right lower extremity 2.5 months ago.  Still on Coumadin for same, but patient states "severe pain in extremity last night and was awaken several times due to pain".

## 2012-01-12 NOTE — Discharge Instructions (Signed)
Your bloodwork did not show any signs of new clots or extension of your old clots. Your INR was slightly low at 1.8. Please take 7.5 mg of Coumadin when you get home tonight and resume your normal schedule afterwards (7.5 mg on Tues/Thurs, 5 other days). Keep your follow up with Dr. Caryl Never on Monday for your INR check. It is OK to take your home pain medication and use the warm compresses as needed. Return to the ED sooner if you develop shortness of breath, chest pain, or other worrisome symptoms.  Deep Vein Thrombosis A deep vein thrombosis (DVT) is a blood clot (thrombus) that develops in a deep vein. A DVT is a clot in the deep, larger veins of the leg, arm, or pelvis. These are more dangerous than clots that might form in veins on the surface of the body. Deep vein thrombosis can lead to complications if the clot breaks off and travels in the bloodstream to the lungs. CAUSES Blood clots form in a vein for different reasons. Usually several things cause blood clots. They include:  The flow of blood slows down.   The inside of the vein is damaged in some way.   The person has a condition that makes blood clot more easily. These conditions may include:   Older age (especially over 53 years old).   Having a history of blood clots.   Having major or lengthy surgery. Hip surgery is particularly high-risk.   Breaking a hip or leg.   Sitting or lying still for a long time.   Cancer or cancer treatment.   Having a long, thin tube (catheter) placed inside a vein during a medical procedure.   Being overweight (obese).   Pregnancy and childbirth.   Medicines with estrogen.   Smoking.   Other circulation or heart problems.  SYMPTOMS When a clot forms, it can either partially or totally block the blood flow in that vein. Symptoms of a DVT can include:  Swelling of the leg or arm, especially if one side is much worse.   Warmth and redness of the leg or arm, especially if one side is  much worse.   Pain in an arm or leg. If the clot is in the leg, symptoms may be more noticeable or worse when standing or walking.  If the blood clot travels to the lung, it may cause:  Shortness of breath.   Chest pain. The pain may be worsened by deep breaths.   Coughing up thick mucus (phlegm), possibly flecked with blood.  Anyone with these symptoms should get emergency medical treatment right away. Call your local emergency services (911 in [place] if you have these symptoms. DIAGNOSIS If a DVT is suspected, your caregiver will take a full medical history. He or she will also perform a physical exam. Tests that also may be required include:  Studies of the clotting properties of the blood.   An ultrasound scan.   X-rays to show the flow of blood when special dye is injected into the veins (venography).   Studies of your lungs if you have any chest symptoms.  PREVENTION  Exercise the legs regularly. Take a brisk 30 minute walk every day.   Maintain a weight that is appropriate for your height.   Avoid sitting or lying in bed for long periods of time without moving your legs.   Women, particularly those over the age of 41, should consider the risks and benefits of taking estrogen medicines, including birth control  pills.   Do not smoke, especially if you take estrogen medicines.   Long-distance travel can increase your risk. You should exercise your legs by walking or pumping the muscles every hour.   In hospital prevention:   Prevention may include medical and nonmedical measures.  TREATMENT  The most common treatment for DVT is blood thinning (anticoagulant) medicine, which reduces the blood's tendency to clot. Anticoagulants can stop new blood clots from forming and old ones from growing. They cannot dissolve existing clots. Your body does this by itself over time. Anticoagulants can be given by mouth, by intravenous (IV) access, or by injection. Your caregiver will  determine the best program for you.   Less commonly, clot-dissolving drugs (thrombolytics) are used to dissolve a DVT. They carry a high risk of bleeding, so they are used mainly in severe cases.   Very rarely, a blood clot in the leg needs to be removed surgically.   If you are unable to take anticoagulants, your caregiver may arrange for you to have a filter placed in a main vein in your belly (abdomen). This filter prevents clots from traveling to your lungs.  HOME CARE INSTRUCTIONS  Take all medicines prescribed by your caregiver. Follow the directions carefully.   You will most likely continue taking anticoagulants after you leave the hospital. Your caregiver will advise you on the length of treatment (usually 3 to 6 months, sometimes for life).   Taking too much or too little of an anticoagulant is dangerous. While taking this type of medicine, you will need to have regular blood tests to be sure the dose is correct. The dose can change for many reasons. It is critically important that you take this medicine exactly as prescribed, and that you have blood tests exactly as directed.   Many foods can interfere with anticoagulants. These include foods high in vitamin K, such as spinach, kale, broccoli, cabbage, collard and turnip greens, Brussels sprouts, peas, cauliflower, seaweed, parsley, beef and pork liver, green tea, and soybean oil. Your caregiver should discuss limits on these foods with you or you should arrange a visit with a dietician to answer your questions.   Many medicines can interfere with anticoagulants. You must tell your caregiver about any and all medicines you take. This includes all vitamins and supplements. Be especially cautious with aspirin and anti-inflammatory medicines. Ask your caregiver before taking these.   Anticoagulants can have side effects, mostly excessive bruising or bleeding. You will need to hold pressure over cuts for longer than usual. Avoid alcoholic  drinks or consume only very small amounts while taking this medicine.   If you are taking an anticoagulant:   Wear a medical alert bracelet.   Notify your dentist or other caregivers before procedures.   Avoid contact sports.   Ask your caregiver how soon you can go back to normal activities. Not being active can lead to new clots. Ask for a list of what you should and should not do.   Exercise your lower leg muscles. This is important while traveling.   You may need to wear compression stockings. These are tight elastic stockings that apply pressure to the lower legs. This can help keep the blood in the legs from clotting.   If you are a smoker, you should quit.   Learn as much as you can about DVT.  SEEK MEDICAL CARE IF:  You have unusual bruising or any bleeding problems.   The swelling or pain in your affected arm  or leg is not gradually improving.   You anticipate surgery or long-distance travel. You should get specific advice on DVT prevention.   You discover other family members with blood clots. This may require further testing for inherited diseases or conditions.  SEEK IMMEDIATE MEDICAL CARE IF:  You develop chest pain.   You develop severe shortness of breath.   You begin to cough up bloody mucus or phlegm (sputum).   You feel dizzy or faint.   You develop swelling or pain in the leg.   You have breathing problems after traveling.  MAKE SURE YOU:  Understand these instructions.   Will watch your condition.   Will get help right away if you are not doing well or get worse.  Document Released: 07/12/2005 Document Revised: 07/01/2011 Document Reviewed: 09/03/2010 [facility] Patient Information 2012 [place], [place].

## 2012-01-12 NOTE — Telephone Encounter (Signed)
Pt called and said that she may be having problems with blood clots in legs. Legs are swollen and are extremely painful. Pt tried contacting the [facility], but was unable to reach anyone and lft a vm for a returned call, but no one has called back yet. Pt says that she is experiencing sharp, stabbing pains, radiating from ankle to hip and there is pressure is legs. Pt said that she is going to go to the urgent care at [facility].

## 2012-01-13 NOTE — ED Provider Notes (Signed)
Medical screening examination/treatment/procedure(s) were conducted as a shared visit with non-physician practitioner(s) and myself.  I personally evaluated the patient during the encounter  Juliet Rude. Rubin Payor, MD 01/13/12 [phone]

## 2012-01-17 ENCOUNTER — Ambulatory Visit (INDEPENDENT_AMBULATORY_CARE_PROVIDER_SITE_OTHER): Payer: Medicare Other | Admitting: Family

## 2012-01-17 DIAGNOSIS — I824Y9 Acute embolism and thrombosis of unspecified deep veins of unspecified proximal lower extremity: Secondary | ICD-10-CM | POA: Diagnosis not present

## 2012-01-17 DIAGNOSIS — I82419 Acute embolism and thrombosis of unspecified femoral vein: Secondary | ICD-10-CM

## 2012-01-17 LAB — POCT INR: INR: 2.3

## 2012-01-17 NOTE — Patient Instructions (Signed)
Since you have been taking 7.5mg three days a week, You will take 7.5mg Tues, Thursday and Saturday. 5mg all other days.     Latest dosing instructions   Total Sun Mon Tue Wed Thu Fri Sat   42.5 5 mg 5 mg 7.5 mg 5 mg 7.5 mg 5 mg 7.5 mg    (5 mg1) (5 mg1) (5 mg1.5) (5 mg1) (5 mg1.5) (5 mg1) (5 mg1.5)        

## 2012-01-20 ENCOUNTER — Encounter: Payer: Self-pay | Admitting: Family Medicine

## 2012-01-20 ENCOUNTER — Ambulatory Visit (INDEPENDENT_AMBULATORY_CARE_PROVIDER_SITE_OTHER): Payer: Medicare Other | Admitting: Family Medicine

## 2012-01-20 VITALS — BP 100/70 | Temp 98.0°F | Wt 198.0 lb

## 2012-01-20 DIAGNOSIS — IMO0002 Reserved for concepts with insufficient information to code with codable children: Secondary | ICD-10-CM

## 2012-01-20 DIAGNOSIS — I82419 Acute embolism and thrombosis of unspecified femoral vein: Secondary | ICD-10-CM

## 2012-01-20 DIAGNOSIS — I824Y9 Acute embolism and thrombosis of unspecified deep veins of unspecified proximal lower extremity: Secondary | ICD-10-CM | POA: Diagnosis not present

## 2012-01-20 DIAGNOSIS — M5416 Radiculopathy, lumbar region: Secondary | ICD-10-CM

## 2012-01-20 NOTE — Patient Instructions (Addendum)
Follow up for any lower extremity weakness or numbness.

## 2012-01-20 NOTE — Progress Notes (Signed)
  Subjective:    Patient ID: Ashley Castaneda, female    DOB: 05/29/1951, 61 y.o.   MRN: [id number]  HPI  Emergency room followup. Patient had DVT approximately 3 months ago. Last week she developed some extreme pain right lower extremity which radiated from the buttock all the way down toward the calf region. She went to ER for further violation. Repeat Doppler revealed small chronic DVT distal femoral vein and subacute DVT popliteal and proximal posterior tibial veins. She denied any chest pains or increased dyspnea. chronic intermittent edema.  Pain was described as sharp and somewhat intermittent. No weakness. No urine incontinence. She's had some chronic low back pain for several years. INR 1.8 in emergency room she was given increased dose of Coumadin and repeat INR 01/17/2012 2.3. No bleeding complications   Review of Systems  Constitutional: Negative for fever and chills.  Respiratory: Negative for cough and shortness of breath.   Cardiovascular: Positive for leg swelling. Negative for chest pain.  Musculoskeletal: Positive for back pain.  Neurological: Negative for weakness and numbness.       Objective:   Physical Exam  Constitutional: She appears well-developed and well-nourished. No distress.  Cardiovascular: Normal rate and regular rhythm.   Pulmonary/Chest: Effort normal and breath sounds normal. No respiratory distress. She has no wheezes. She has no rales.  Musculoskeletal: She exhibits no edema.       No pitting edema  Neurological:       Straight leg raises are negative. She has slightly diminished ankle reflex on the right compared to the left. Trace patellar reflex bilaterally. No focal strength deficits. Gait is normal. Sensory function normal.          Assessment & Plan:  #1 right lower extremity pain. Suspect lumbar radiculopathy. No focal weakness. 50% improved compared to ER visit. Observe for now. #2 history of recent DVT. INR therapeutic last visit. Repeat  in 3 weeks

## 2012-01-25 DIAGNOSIS — M531 Cervicobrachial syndrome: Secondary | ICD-10-CM | POA: Diagnosis not present

## 2012-01-25 DIAGNOSIS — R51 Headache: Secondary | ICD-10-CM | POA: Diagnosis not present

## 2012-01-25 DIAGNOSIS — M79609 Pain in unspecified limb: Secondary | ICD-10-CM | POA: Diagnosis not present

## 2012-01-25 DIAGNOSIS — G894 Chronic pain syndrome: Secondary | ICD-10-CM | POA: Diagnosis not present

## 2012-02-07 ENCOUNTER — Ambulatory Visit (INDEPENDENT_AMBULATORY_CARE_PROVIDER_SITE_OTHER): Payer: Medicare Other | Admitting: Family

## 2012-02-07 DIAGNOSIS — I82419 Acute embolism and thrombosis of unspecified femoral vein: Secondary | ICD-10-CM

## 2012-02-07 DIAGNOSIS — I824Y9 Acute embolism and thrombosis of unspecified deep veins of unspecified proximal lower extremity: Secondary | ICD-10-CM

## 2012-02-07 MED ORDER — WARFARIN SODIUM 5 MG PO TABS: 7.5000 mg | ORAL_TABLET | Freq: Every day | ORAL | Status: DC

## 2012-02-07 NOTE — Patient Instructions (Addendum)
Since you have been taking 7.5mg  three days a week, You will take 7.5mg  Tues, Thursday and Saturday. 5mg  all other days.     Latest dosing instructions   Total Sun Mon Tue Wed Thu Fri Sat   42.5 5 mg 5 mg 7.5 mg 5 mg 7.5 mg 5 mg 7.5 mg    (5 mg1) (5 mg1) (5 mg1.5) (5 mg1) (5 mg1.5) (5 mg1) (5 mg1.5)

## 2012-02-07 NOTE — Addendum Note (Signed)
Addended by: Beverely Low on: 02/07/2012 01:35 PM   Modules accepted: Orders

## 2012-03-06 ENCOUNTER — Encounter: Payer: Medicare Other | Admitting: Family

## 2012-03-08 ENCOUNTER — Ambulatory Visit (INDEPENDENT_AMBULATORY_CARE_PROVIDER_SITE_OTHER): Payer: Medicare Other | Admitting: Family

## 2012-03-08 DIAGNOSIS — I82419 Acute embolism and thrombosis of unspecified femoral vein: Secondary | ICD-10-CM

## 2012-03-08 DIAGNOSIS — I824Y9 Acute embolism and thrombosis of unspecified deep veins of unspecified proximal lower extremity: Secondary | ICD-10-CM | POA: Diagnosis not present

## 2012-03-08 NOTE — Patient Instructions (Addendum)
7.5mg  Tues, Thursday and Saturday. 5mg  all other days. Recheck in 6 weeks    Latest dosing instructions   Total Sun Mon Tue Wed Thu Fri Sat   42.5 5 mg 5 mg 7.5 mg 5 mg 7.5 mg 5 mg 7.5 mg    (5 mg1) (5 mg1) (5 mg1.5) (5 mg1) (5 mg1.5) (5 mg1) (5 mg1.5)

## 2012-03-15 DIAGNOSIS — K921 Melena: Secondary | ICD-10-CM | POA: Diagnosis not present

## 2012-03-15 DIAGNOSIS — K219 Gastro-esophageal reflux disease without esophagitis: Secondary | ICD-10-CM | POA: Diagnosis not present

## 2012-03-15 DIAGNOSIS — K5900 Constipation, unspecified: Secondary | ICD-10-CM | POA: Diagnosis not present

## 2012-03-15 DIAGNOSIS — K529 Noninfective gastroenteritis and colitis, unspecified: Secondary | ICD-10-CM | POA: Diagnosis not present

## 2012-03-20 DIAGNOSIS — M531 Cervicobrachial syndrome: Secondary | ICD-10-CM | POA: Diagnosis not present

## 2012-03-20 DIAGNOSIS — G500 Trigeminal neuralgia: Secondary | ICD-10-CM | POA: Diagnosis not present

## 2012-03-20 DIAGNOSIS — M545 Low back pain: Secondary | ICD-10-CM | POA: Diagnosis not present

## 2012-03-20 DIAGNOSIS — R51 Headache: Secondary | ICD-10-CM | POA: Diagnosis not present

## 2012-04-05 DIAGNOSIS — R51 Headache: Secondary | ICD-10-CM | POA: Diagnosis not present

## 2012-04-05 DIAGNOSIS — IMO0002 Reserved for concepts with insufficient information to code with codable children: Secondary | ICD-10-CM | POA: Diagnosis not present

## 2012-04-05 DIAGNOSIS — M545 Low back pain: Secondary | ICD-10-CM | POA: Diagnosis not present

## 2012-04-06 ENCOUNTER — Encounter: Payer: Self-pay | Admitting: Family Medicine

## 2012-04-06 ENCOUNTER — Ambulatory Visit (INDEPENDENT_AMBULATORY_CARE_PROVIDER_SITE_OTHER): Payer: Medicare Other | Admitting: Family Medicine

## 2012-04-06 ENCOUNTER — Telehealth: Payer: Self-pay | Admitting: Family Medicine

## 2012-04-06 VITALS — BP 108/62 | Temp 98.0°F | Wt 205.0 lb

## 2012-04-06 DIAGNOSIS — I824Y9 Acute embolism and thrombosis of unspecified deep veins of unspecified proximal lower extremity: Secondary | ICD-10-CM | POA: Diagnosis not present

## 2012-04-06 DIAGNOSIS — I82419 Acute embolism and thrombosis of unspecified femoral vein: Secondary | ICD-10-CM

## 2012-04-06 DIAGNOSIS — S81809A Unspecified open wound, unspecified lower leg, initial encounter: Secondary | ICD-10-CM

## 2012-04-06 DIAGNOSIS — S81839A Puncture wound without foreign body, unspecified lower leg, initial encounter: Secondary | ICD-10-CM

## 2012-04-06 DIAGNOSIS — S81009A Unspecified open wound, unspecified knee, initial encounter: Secondary | ICD-10-CM | POA: Diagnosis not present

## 2012-04-06 NOTE — Telephone Encounter (Signed)
Caller: Breezy/Patient; Patient Name: Ashley Castaneda; PCP: Evelena [facility]); Best Callback Phone Number: [phone]; Caller reports onset 04/04/12 of being hit by dog's in her right leg, that causes bleeding which stopped, she has a blood clot  in the same area. Guideline: Wound,  Disposition: See within 4 hours, has appointment at 1430 04/06/12 with Dr Caryl Never already scheduled.

## 2012-04-06 NOTE — Progress Notes (Signed)
Subjective:    Patient ID: Ashley Castaneda, female    DOB: 1950/11/11, 61 y.o.   MRN: [id number]  HPI  Patient seen for the following issues:  Her dog accidentally clawed her right leg. This occurred couple days ago. No history of dog bite. She's had some local soreness but no erythema no drainage. No fever or chills. Dog has been fully vaccinated. Her tetanus is up to date.  She has history of DVT. This occurred last April. She's been on Coumadin now almost 6 full months. She had repeat Doppler scan back in June which showed no propagation of prior clot. She's been on Coumadin steadily since then. She has modified her risk by stopping smoking and also coming off estrogen since her initial diagnosis. She's never had a prior blood clots.  Past Medical History  Diagnosis Date  . Unspecified vitamin D deficiency 03/31/2010  . ANXIETY 07/01/2008  . DEPRESSION 07/01/2008  . REFLEX SYMPATHETIC DYSTROPHY 07/01/2008  . Trigeminal neuralgia 05/21/2009  . Other specified trigeminal nerve disorders 07/01/2008  . ALLERGIC RHINITIS 07/01/2008  . BRONCHITIS, CHRONIC 07/01/2008  . GERD 07/01/2008  . ECZEMA 05/29/2009  . OSTEOARTHRITIS 07/01/2008  . OCCIPITAL NEURALGIA 07/01/2008  . BACK PAIN, THORACIC REGION 03/04/2010  . FIBROMYALGIA 07/01/2008  . LEG PAIN, BILATERAL 07/01/2008  . FACIAL PAIN 12/25/2009  . CARPAL TUNNEL SYNDROME, HX OF 07/01/2008  . DVT, HX OF 07/01/2008  . MIGRAINES, HX OF 07/01/2008  . CHRONIC OBSTRUCTIVE PULMONARY DISEASE, ACUTE EXACERBATION 08/07/2010   Past Surgical History  Procedure Date  . Lavh     BSO  . Tonsillectomy 1957  . Right foot bone spur 1987  . Left knee surgery 1989  . Right knee surgery 1990  . Right soulder surgery 1992, 1993, 1995, 1999  . Head craniectomy 1994    MICROVASULAR DECOMPRESSION  . Right foot surgery 1998    BAD CUT  . Right jaw surgery 2002  . Right thumb sugery 2004  . Carpal tunnel release 2009    RIGHT    reports that she has been smoking  Cigarettes.  She has a 60 pack-year smoking history. She has never used smokeless tobacco. She reports that she does not drink alcohol or use illicit drugs. family history includes Cancer in her father and mother; Hypertension in her father; Ovarian cancer in her mother; and Prostate cancer in her father. Allergies  Allergen Reactions  . Cefazolin     Ancef - in surgery, swelled up, turned blue, heart problems  . Baclofen     REACTION: confusion  . Carbamazepine     REACTION: confusion  . Ceclor (Cefaclor)     hives  . Fentanyl     REACTION: nausea and vomiting  . Gabapentin     REACTION: confusion  . Oxcarbazepine     REACTION: confusion      Review of Systems  Constitutional: Negative for fever and chills.  Respiratory: Negative for cough and shortness of breath.   Cardiovascular: Negative for chest pain and palpitations.  Gastrointestinal: Negative for abdominal pain.  Hematological: Does not bruise/bleed easily.       Objective:   Physical Exam  Constitutional: She appears well-developed and well-nourished. No distress.  Cardiovascular: Normal rate and regular rhythm.   Pulmonary/Chest: Effort normal and breath sounds normal. No respiratory distress. She has no wheezes. She has no rales.  Musculoskeletal:       Patient has no pitting edema. She has small puncture wound right intermittent catheter. No  surrounding erythema. Minimally tender. No fluctuance. No cellulitis changes.          Assessment & Plan:  #1 history of DVT right lower extremity. Onset about 6 months ago. She has reduced risk by stopping smoking and stopping estrogen. At this point can discontinue Coumadin. Followup promptly for any recurrent leg edema  #2 puncture wound right leg. No secondary infection. Tetanus up-to-date. Followup promptly for signs of secondary infection

## 2012-04-06 NOTE — Patient Instructions (Addendum)
Stop coumadin at this time and start baby aspirin at this time.

## 2012-04-13 DIAGNOSIS — I82409 Acute embolism and thrombosis of unspecified deep veins of unspecified lower extremity: Secondary | ICD-10-CM | POA: Diagnosis not present

## 2012-04-24 DIAGNOSIS — I80299 Phlebitis and thrombophlebitis of other deep vessels of unspecified lower extremity: Secondary | ICD-10-CM | POA: Diagnosis not present

## 2012-04-26 DIAGNOSIS — G609 Hereditary and idiopathic neuropathy, unspecified: Secondary | ICD-10-CM | POA: Diagnosis not present

## 2012-04-26 DIAGNOSIS — IMO0002 Reserved for concepts with insufficient information to code with codable children: Secondary | ICD-10-CM | POA: Diagnosis not present

## 2012-04-26 DIAGNOSIS — E559 Vitamin D deficiency, unspecified: Secondary | ICD-10-CM | POA: Diagnosis not present

## 2012-04-26 DIAGNOSIS — IMO0001 Reserved for inherently not codable concepts without codable children: Secondary | ICD-10-CM | POA: Diagnosis not present

## 2012-04-26 DIAGNOSIS — M531 Cervicobrachial syndrome: Secondary | ICD-10-CM | POA: Diagnosis not present

## 2012-04-26 DIAGNOSIS — D518 Other vitamin B12 deficiency anemias: Secondary | ICD-10-CM | POA: Diagnosis not present

## 2012-04-26 DIAGNOSIS — G43719 Chronic migraine without aura, intractable, without status migrainosus: Secondary | ICD-10-CM | POA: Diagnosis not present

## 2012-04-27 ENCOUNTER — Ambulatory Visit: Payer: Self-pay | Admitting: Family

## 2012-04-27 DIAGNOSIS — I82419 Acute embolism and thrombosis of unspecified femoral vein: Secondary | ICD-10-CM

## 2012-04-27 NOTE — Patient Instructions (Signed)
Therapy discontinued by Dr. Caryl Never

## 2012-05-04 ENCOUNTER — Telehealth: Payer: Self-pay | Admitting: Family Medicine

## 2012-05-04 ENCOUNTER — Ambulatory Visit (INDEPENDENT_AMBULATORY_CARE_PROVIDER_SITE_OTHER): Payer: Medicare Other | Admitting: Family Medicine

## 2012-05-04 ENCOUNTER — Encounter: Payer: Self-pay | Admitting: Family Medicine

## 2012-05-04 VITALS — BP 112/72 | Temp 97.6°F | Wt 206.0 lb

## 2012-05-04 DIAGNOSIS — R42 Dizziness and giddiness: Secondary | ICD-10-CM | POA: Diagnosis not present

## 2012-05-04 DIAGNOSIS — Z23 Encounter for immunization: Secondary | ICD-10-CM

## 2012-05-04 MED ORDER — METHYLPREDNISOLONE ACETATE 80 MG/ML IJ SUSP
80.0000 mg | Freq: Once | INTRAMUSCULAR | Status: AC
  Administered 2012-05-04: 80 mg via INTRAMUSCULAR

## 2012-05-04 NOTE — Patient Instructions (Addendum)
Touch base by next week if ear discomfort and vertigo not improved.

## 2012-05-04 NOTE — Progress Notes (Signed)
  Subjective:    Patient ID: Ashley Castaneda, female    DOB: 1951/03/15, 61 y.o.   MRN: [id number]  HPI  Patient seen with symptoms of dizziness off and on for the past month. This sounds more like vertigo. Intermittent left ear pain. History of eustachian tube problems in the past. Frequent sinus congestive symptoms. Sudafed and nasal steroids helped briefly. Symptoms worse with head movement. Moderate severity. She has some chronic hearing loss left side which is unchanged. Occasional associated nausea. No progressive headaches. She has some chronic headaches and is seeing neurology specialist for that. She denies any dysarthria or dysphagia. No ataxia.  She relates some chills and sweats off and on but normal temperature. She's taking Atarax per neurologist and is also using nasal steroid frequently. Still has some nasal congestion and postnasal drip symptoms.  Recently taken off Coumadin for DVT. She still has occasional right lower extremity pain but no progressive swelling. Taking aspirin.   Review of Systems  Constitutional: Positive for chills and fatigue. Negative for fever.  HENT: Positive for hearing loss (chronic), ear pain, congestion, postnasal drip and sinus pressure. Negative for sore throat, voice change and ear discharge.   Eyes: Negative for visual disturbance.  Respiratory: Negative for cough and shortness of breath.   Cardiovascular: Negative for chest pain.  Genitourinary: Negative for dysuria.  Neurological: Positive for dizziness. Negative for syncope and weakness.       Objective:   Physical Exam  Constitutional: She is oriented to person, place, and time. She appears well-developed and well-nourished.  HENT:  Right Ear: External ear normal.  Left Ear: External ear normal.  Mouth/Throat: Oropharynx is clear and moist.  Neck: Neck supple. No thyromegaly present.  Cardiovascular: Normal rate and regular rhythm.   No murmur heard. Pulmonary/Chest: Effort normal and  breath sounds normal. No respiratory distress. She has no wheezes. She has no rales.  Musculoskeletal: She exhibits no edema.  Lymphadenopathy:    She has no cervical adenopathy.  Neurological: She is alert and oriented to person, place, and time. No cranial nerve deficit. Coordination normal.       No ataxia. Normal gait. Normal finger to nose testing. No focal weakness  Psychiatric: She has a normal mood and affect. Her behavior is normal.          Assessment & Plan:  Dizziness. By description vertigo. Question eustachian tube dysfunction. She has some chronic hearing changes left ear which are unchanged. Has previously seen ENT. Suspect she has some allergy issues which are exacerbating eustachian tube issue. Depo-Medrol 80 mg IM given. No history of diabetes. Question component of benign positional vertigo. If no improvement with steroids consider vestibular rehabilitation  Health maintenance. Flu vaccine given

## 2012-05-04 NOTE — Telephone Encounter (Signed)
Emergent call, Caller: Ashley Castaneda/Patient;  PCP: Ashley Castaneda (Family Practice); Best Callback Phone Number: [phone]; per CSR" THE PATIENT REFUSED 911" onset 05/03/12 of fever,  headache with dizziness, chills, last office visit within past month, taking Hydroxizine for dizziness. Guideline: Dizziness, Disposition: see within 24 hours due to symptoms interfering with ADLS, offered appointment 46/96/29, she states no transportation, agrees to appointment scheduled for 05/05/12 @ 1145 with Dr Caryl Never.

## 2012-05-05 ENCOUNTER — Ambulatory Visit: Payer: Medicare Other | Admitting: Family Medicine

## 2012-05-08 ENCOUNTER — Telehealth: Payer: Self-pay | Admitting: Family Medicine

## 2012-05-08 DIAGNOSIS — H8110 Benign paroxysmal vertigo, unspecified ear: Secondary | ICD-10-CM

## 2012-05-08 NOTE — Telephone Encounter (Signed)
Referral order sent, pt will be informed

## 2012-05-08 NOTE — Telephone Encounter (Signed)
In reviewing OV note from last week, vestibular rehabilitation order placed.  Called Ashley Castaneda to inform; she does not want to go to rehab until she knows for sure what is wrong, c/o ongoing sx.  I read the Ashley Castaneda Dr Lucie Leather plan from OV.  Ashley Castaneda adamant wanting referral to ENT first.  Will refer.  Cancelled rehabilitation order. FYI only

## 2012-05-08 NOTE — Telephone Encounter (Signed)
Pt was here 10/10 for dizziness. Dr. Caryl Never gave her an injection and said to let us know if she was not better in 2-3 days. Per pt, Dr. Caryl Never said that if it did not help, she was to go to ENT (consider vestibular rehabilitation), and he was to leave a note in her chart to that effect see end of OV note 10/10. Do we need a referral? She knows he is out this wk, and does not want to wait for him to return to get set up. Please advise & call pt.

## 2012-05-10 DIAGNOSIS — Z886 Allergy status to analgesic agent status: Secondary | ICD-10-CM | POA: Diagnosis not present

## 2012-05-10 DIAGNOSIS — K219 Gastro-esophageal reflux disease without esophagitis: Secondary | ICD-10-CM | POA: Diagnosis not present

## 2012-05-10 DIAGNOSIS — J449 Chronic obstructive pulmonary disease, unspecified: Secondary | ICD-10-CM | POA: Diagnosis not present

## 2012-05-10 DIAGNOSIS — M542 Cervicalgia: Secondary | ICD-10-CM | POA: Diagnosis not present

## 2012-05-10 DIAGNOSIS — Z888 Allergy status to other drugs, medicaments and biological substances status: Secondary | ICD-10-CM | POA: Diagnosis not present

## 2012-05-10 DIAGNOSIS — Z885 Allergy status to narcotic agent status: Secondary | ICD-10-CM | POA: Diagnosis not present

## 2012-05-10 DIAGNOSIS — Z86718 Personal history of other venous thrombosis and embolism: Secondary | ICD-10-CM | POA: Diagnosis not present

## 2012-05-10 DIAGNOSIS — F17200 Nicotine dependence, unspecified, uncomplicated: Secondary | ICD-10-CM | POA: Diagnosis not present

## 2012-05-10 DIAGNOSIS — M47812 Spondylosis without myelopathy or radiculopathy, cervical region: Secondary | ICD-10-CM | POA: Diagnosis not present

## 2012-05-10 DIAGNOSIS — IMO0001 Reserved for inherently not codable concepts without codable children: Secondary | ICD-10-CM | POA: Diagnosis not present

## 2012-05-10 DIAGNOSIS — R51 Headache: Secondary | ICD-10-CM | POA: Diagnosis not present

## 2012-05-16 DIAGNOSIS — H52209 Unspecified astigmatism, unspecified eye: Secondary | ICD-10-CM | POA: Diagnosis not present

## 2012-05-16 DIAGNOSIS — H2510 Age-related nuclear cataract, unspecified eye: Secondary | ICD-10-CM | POA: Diagnosis not present

## 2012-05-17 DIAGNOSIS — R42 Dizziness and giddiness: Secondary | ICD-10-CM | POA: Diagnosis not present

## 2012-05-17 DIAGNOSIS — G500 Trigeminal neuralgia: Secondary | ICD-10-CM | POA: Diagnosis not present

## 2012-05-17 DIAGNOSIS — G43909 Migraine, unspecified, not intractable, without status migrainosus: Secondary | ICD-10-CM | POA: Diagnosis not present

## 2012-05-30 ENCOUNTER — Other Ambulatory Visit: Payer: Self-pay | Admitting: Family Medicine

## 2012-05-30 NOTE — Telephone Encounter (Signed)
Xanax last filled 11-17-11,k #90 with 5 refills. Sig: one tab TID PRN.  Pt has CPX 07/05/12.

## 2012-05-30 NOTE — Telephone Encounter (Signed)
Pt needs written rxs for generic aciphex 20 mg #90, generic xanax 0.5 mg# 90, generic singulair 10 mg #90, furosemide 20 mg #90 and fluticasone #3 each rxs with 3 refills. Pt is requesting rxs to be mailed to home address.

## 2012-05-30 NOTE — Telephone Encounter (Signed)
Refill for 6 months. 

## 2012-05-31 MED ORDER — FLUTICASONE PROPIONATE 50 MCG/ACT NA SUSP: 2.0000 | Freq: Every day | NASAL | Status: DC | PRN

## 2012-05-31 MED ORDER — RABEPRAZOLE SODIUM 20 MG PO TBEC: 20.0000 mg | DELAYED_RELEASE_TABLET | ORAL | Status: DC

## 2012-05-31 MED ORDER — ALPRAZOLAM 0.5 MG PO TABS: ORAL_TABLET | ORAL | Status: DC

## 2012-05-31 MED ORDER — MONTELUKAST SODIUM 10 MG PO TABS: 10.0000 mg | ORAL_TABLET | Freq: Every day | ORAL | Status: DC

## 2012-05-31 MED ORDER — FUROSEMIDE 20 MG PO TABS: 20.0000 mg | ORAL_TABLET | Freq: Every day | ORAL | Status: DC

## 2012-05-31 NOTE — Telephone Encounter (Signed)
Rx printed, will mail to pt home

## 2012-06-02 DIAGNOSIS — H5710 Ocular pain, unspecified eye: Secondary | ICD-10-CM | POA: Diagnosis not present

## 2012-06-06 ENCOUNTER — Ambulatory Visit (INDEPENDENT_AMBULATORY_CARE_PROVIDER_SITE_OTHER): Payer: Medicare Other | Admitting: Family Medicine

## 2012-06-06 ENCOUNTER — Encounter: Payer: Self-pay | Admitting: Family Medicine

## 2012-06-06 VITALS — BP 120/80 | HR 84 | Temp 97.7°F | Resp 12 | Ht 62.75 in | Wt 206.0 lb

## 2012-06-06 DIAGNOSIS — R5383 Other fatigue: Secondary | ICD-10-CM | POA: Diagnosis not present

## 2012-06-06 DIAGNOSIS — Z0000 Encounter for general adult medical examination without abnormal findings: Secondary | ICD-10-CM | POA: Diagnosis not present

## 2012-06-06 DIAGNOSIS — E785 Hyperlipidemia, unspecified: Secondary | ICD-10-CM

## 2012-06-06 DIAGNOSIS — E559 Vitamin D deficiency, unspecified: Secondary | ICD-10-CM | POA: Diagnosis not present

## 2012-06-06 DIAGNOSIS — R5381 Other malaise: Secondary | ICD-10-CM

## 2012-06-06 DIAGNOSIS — R51 Headache: Secondary | ICD-10-CM | POA: Diagnosis not present

## 2012-06-06 LAB — CBC WITH DIFFERENTIAL/PLATELET
Basophils Relative: 1.1 % (ref 0.0–3.0)
Eosinophils Absolute: 0.2 10*3/uL (ref 0.0–0.7)
Eosinophils Relative: 3.4 % (ref 0.0–5.0)
Hemoglobin: 12.4 g/dL (ref 12.0–15.0)
Lymphocytes Relative: 28.5 % (ref 12.0–46.0)
MCHC: 32.1 g/dL (ref 30.0–36.0)
Neutro Abs: 3.6 10*3/uL (ref 1.4–7.7)
Neutrophils Relative %: 57.4 % (ref 43.0–77.0)
RBC: 4.02 Mil/uL (ref 3.87–5.11)
WBC: 6.3 10*3/uL (ref 4.5–10.5)

## 2012-06-06 LAB — BASIC METABOLIC PANEL
CO2: 31 mEq/L (ref 19–32)
GFR: 78.79 mL/min (ref >60.00)
Glucose, Bld: 84 mg/dL (ref 70–99)
Potassium: 4 mEq/L (ref 3.5–5.1)
Sodium: 142 mEq/L (ref 135–145)

## 2012-06-06 LAB — HEPATIC FUNCTION PANEL
ALT: 17 U/L (ref 0–35)
Bilirubin, Direct: 0 mg/dL (ref 0.0–0.3)
Total Bilirubin: 0.1 mg/dL — ABNORMAL LOW (ref 0.3–1.2)
Total Protein: 7.1 g/dL (ref 6.0–8.3)

## 2012-06-06 LAB — LIPID PANEL
HDL: 66.2 mg/dL (ref >39.00)
Triglycerides: 102 mg/dL (ref 0.0–149.0)
VLDL: 20.4 mg/dL (ref 0.0–40.0)

## 2012-06-06 MED ORDER — AZITHROMYCIN 250 MG PO TABS: ORAL_TABLET | ORAL | Status: DC

## 2012-06-06 MED ORDER — POTASSIUM CHLORIDE ER 10 MEQ PO TBCR: 10.0000 meq | EXTENDED_RELEASE_TABLET | Freq: Every day | ORAL | Status: DC | PRN

## 2012-06-06 NOTE — Patient Instructions (Addendum)
Continue with yearly mammogram. Continue yearly flu vaccine yearly.

## 2012-06-06 NOTE — Progress Notes (Signed)
Subjective:    Patient ID: Ashley Castaneda, female    DOB: 12-Aug-1950, 61 y.o.   MRN: [id number]  HPI  Patient seen for Medicare wellness exam and other issues as below. She has multiple chronic problems as outlined below. Regarding health maintenance issues she had Pneumovax 2009. Flu vaccine earlier this year. Tetanus 2011. Mammogram scheduled for next week. She continues to see gynecologist but she's had previous total abdominal hysterectomy. She's never had shingles vaccine but has clinically had shingles.  Chronic problems include history of trigeminal neuralgia, vitamin D deficiency, chronic back pain, osteoarthritis, GERD, history of depression, history of DVT, chronic anxiety. She quit smoking last summer.  Patient's had some persistent left maxillary facial pain. Refer to prior note. We suspected possible allergy issues. She has not responded with Singulair, antihistamines, Flonase, nor steroid injection. She saw ENT who recommend she continue Flonase. She has intermittent facial pains. She has some chronic headaches which are unchanged. She also having some pain behind her eyes intermittently. She has previously seen neuro ophthalmologist and is considering referral back to them.  She has chronic fatigue which is essentially unchanged. Medications reviewed. Denies active depression issues  Past Medical History  Diagnosis Date  . Unspecified vitamin D deficiency 03/31/2010  . ANXIETY 07/01/2008  . DEPRESSION 07/01/2008  . REFLEX SYMPATHETIC DYSTROPHY 07/01/2008  . Trigeminal neuralgia 05/21/2009  . Other specified trigeminal nerve disorders 07/01/2008  . ALLERGIC RHINITIS 07/01/2008  . BRONCHITIS, CHRONIC 07/01/2008  . GERD 07/01/2008  . ECZEMA 05/29/2009  . OSTEOARTHRITIS 07/01/2008  . OCCIPITAL NEURALGIA 07/01/2008  . BACK PAIN, THORACIC REGION 03/04/2010  . FIBROMYALGIA 07/01/2008  . LEG PAIN, BILATERAL 07/01/2008  . FACIAL PAIN 12/25/2009  . CARPAL TUNNEL SYNDROME, HX OF 07/01/2008  . DVT,  HX OF 07/01/2008  . MIGRAINES, HX OF 07/01/2008  . CHRONIC OBSTRUCTIVE PULMONARY DISEASE, ACUTE EXACERBATION 08/07/2010   Past Surgical History  Procedure Date  . Lavh     BSO  . Tonsillectomy 1957  . Right foot bone spur 1987  . Left knee surgery 1989  . Right knee surgery 1990  . Right soulder surgery 1992, 1993, 1995, 1999  . Head craniectomy 1994    MICROVASULAR DECOMPRESSION  . Right foot surgery 1998    BAD CUT  . Right jaw surgery 2002  . Right thumb sugery 2004  . Carpal tunnel release 2009    RIGHT    reports that she quit smoking about 4 months ago. Her smoking use included Cigarettes. She has a 60 pack-year smoking history. She has never used smokeless tobacco. She reports that she does not drink alcohol or use illicit drugs. family history includes Cancer in her father; Cancer (age of onset:53) in her mother; Hypertension in her father; Ovarian cancer in her mother; and Prostate cancer in her father. Allergies  Allergen Reactions  . Cefazolin     Ancef - in surgery, swelled up, turned blue, heart problems  . Baclofen     REACTION: confusion  . Carbamazepine     REACTION: confusion  . Ceclor (Cefaclor)     hives  . Fentanyl     REACTION: nausea and vomiting  . Gabapentin     REACTION: confusion  . Oxcarbazepine     REACTION: confusion   1.  Risk factors based on Past Medical , Social, and Family history reviewed and as indicated above 2.  Limitations in physical activities low risk for fall 3.  Depression/mood has history of depression currently stable 4.  Hearing no deficits 5.  ADLs independent in all 6.  Cognitive function (orientation to time and place, language, writing, speech,memory) no memory deficits. Language and judgment intact 7.  Home Safety no issues 8.  Height, weight, and visual acuity. All stable. 9.  Counseling discussed diet and exercise. 10. Recommendation of preventive services. Check on coverage for shingles vaccine 11. Labs based on  risk factors lipid, basic metabolic panel, CBC, vitamin D level 12. Care Plan above.    Review of Systems  Constitutional: Positive for fatigue. Negative for fever, activity change and unexpected weight change.  HENT: Positive for ear pain and congestion. Negative for hearing loss, sore throat and trouble swallowing.   Eyes: Negative for visual disturbance.  Respiratory: Negative for cough and shortness of breath.   Cardiovascular: Negative for chest pain and palpitations.  Gastrointestinal: Negative for abdominal pain, diarrhea, constipation and blood in stool.  Genitourinary: Negative for dysuria and hematuria.  Musculoskeletal: Negative for myalgias, back pain and arthralgias.  Skin: Negative for rash.  Neurological: Positive for headaches. Negative for dizziness and syncope.  Hematological: Negative for adenopathy.  Psychiatric/Behavioral: Negative for confusion and dysphoric mood.       Objective:   Physical Exam  Constitutional: She is oriented to person, place, and time. She appears well-developed and well-nourished.  HENT:  Head: Normocephalic and atraumatic.  Right Ear: External ear normal.  Left Ear: External ear normal.  Mouth/Throat: Oropharynx is clear and moist.  Eyes: Pupils are equal, round, and reactive to light.  Neck: Neck supple. No thyromegaly present.  Cardiovascular: Normal rate and regular rhythm.   Pulmonary/Chest: Effort normal and breath sounds normal. No respiratory distress. She has no wheezes. She has no rales.  Abdominal: Soft. Bowel sounds are normal. There is no tenderness. There is no rebound and no guarding.  Genitourinary:       Per gyn  Lymphadenopathy:    She has no cervical adenopathy.  Neurological: She is alert and oriented to person, place, and time. No cranial nerve deficit.  Skin: No rash noted.  Psychiatric: She has a normal mood and affect. Her behavior is normal.          Assessment & Plan:  #1 health maintenance issues  addressed. Immunizations up to date with exception of shingles vaccine. Will check on coverage. Mammogram scheduled for next week. She continues to see gynecologist. #2 Persistent left maxillary facial pain. She's had this for several weeks now. Trial antibiotic with Zithromax. If not improved after 10 days consider limited maxillofacial CT scan to further evaluate  #3 fatigue. Somewhat chronic. Check labs with basic metabolic panel, CBC. Recent TSH normal.  #4 history of vitamin D deficiency. Check 25-hydroxy vitamin D level  #5 history of hyperlipidemia. Recheck lipid and hepatic panel

## 2012-06-07 ENCOUNTER — Telehealth: Payer: Self-pay | Admitting: *Deleted

## 2012-06-07 NOTE — Progress Notes (Signed)
Quick Note:  Pt informed ______ 

## 2012-06-07 NOTE — Telephone Encounter (Signed)
When I called pt re: labs, she wanted to inform Dr Caryl Never the earliest [facility] can see her is Dec 20.  If she can wait that long, fine.  If he thinks I need to be seen sooner, he will need to make a referral requesting earlier.

## 2012-06-08 ENCOUNTER — Other Ambulatory Visit (HOSPITAL_COMMUNITY): Payer: Self-pay | Admitting: Cardiology

## 2012-06-08 NOTE — Telephone Encounter (Signed)
Pt informed on home VM 

## 2012-06-08 NOTE — Telephone Encounter (Signed)
I think OK if no visual changes.

## 2012-06-12 ENCOUNTER — Encounter: Payer: Self-pay | Admitting: Gynecology

## 2012-06-12 ENCOUNTER — Ambulatory Visit (INDEPENDENT_AMBULATORY_CARE_PROVIDER_SITE_OTHER): Payer: Medicare Other | Admitting: Gynecology

## 2012-06-12 VITALS — BP 116/64 | Ht 63.5 in | Wt 203.0 lb

## 2012-06-12 DIAGNOSIS — N952 Postmenopausal atrophic vaginitis: Secondary | ICD-10-CM

## 2012-06-12 DIAGNOSIS — N951 Menopausal and female climacteric states: Secondary | ICD-10-CM

## 2012-06-12 MED ORDER — PAROXETINE HCL 10 MG PO TABS: 10.0000 mg | ORAL_TABLET | ORAL | Status: DC

## 2012-06-12 NOTE — Progress Notes (Signed)
Ashley Castaneda January 24, 1951 [id number]        62 y.o.  G0P0 for follow up exam.    Past medical history,surgical history, medications, allergies, family history and social history were all reviewed and documented in the EPIC chart. ROS:  Was performed and pertinent positives and negatives are included in the history.  Exam: Ashley Castaneda assistant Filed Vitals:   06/12/12 1416  BP: 116/64  Height: 5' 3.5" (1.613 m)  Weight: 203 lb (92.08 kg)   General appearance  Normal Skin grossly normal Head/Neck normal with no cervical or supraclavicular adenopathy thyroid normal Lungs  clear Cardiac RR, without RMG Abdominal  soft, nontender, without masses, organomegaly or hernia Breasts  examined lying and sitting without masses, retractions, discharge or axillary adenopathy. Pelvic  Ext/BUS/vagina  normal with atrophic changes  Adnexa  Without masses or tenderness    Anus and perineum  normal   Rectovaginal  normal sphincter tone without palpated masses or tenderness.    Assessment/Plan:  61 y.o. G0P0 female for follow up exam.   1. Menopausal symptoms.  Patient discontinued her HRT this past year due to DVT. Still having hot flashes and night sweats. She has tried OTC products such as soy based without relief. We discussed nonhormonal alternatives. I reviewed Brisdelle. She asked about a generic alternative. Options to try Paxil 5 mg or 10 mg discussed. She also has a history of chronic pain and she apparently had some response in the past to a psychoactive medication. After lengthy discussion she wants to try Paxil 10 mg daily. I discussed the side effect profile as well as suicidal ideation although she understands and accepts. We'll see if this doesn't help with her hot flushes. 2. Atrophic genital changes.  Patient asymptomatic and we'll continue to monitor. 3. Pap smear. No Pap smear done today. Last Pap smear 05/2008. No history of significant abnormal Pap smears. Patient is status post  hysterectomy for benign indications in current recommendations to stop screening discussed. Patient's comfortable with this and will stop screening. 4. Mammography. Patient has scheduled next week and will follow up for this. SBE monthly reviewed. 5. DEXA 04/2010 normal. Plan follow up at the 5 year interval. Increased calcium vitamin D reviewed. 6. Colonoscopy. Patient up-to-date and will follow up with their recommended interval. 7. Health maintenance. No lab work done as this is all done through her primary physician. Follow up one year, sooner as needed.    Dara Lords MD, 3:29 PM 06/12/2012

## 2012-06-12 NOTE — Patient Instructions (Signed)
Tried Paxil for hot flashes as we discussed. Call me if you have any issues with this. Follow up in one year for annual exam.

## 2012-06-15 DIAGNOSIS — Z1231 Encounter for screening mammogram for malignant neoplasm of breast: Secondary | ICD-10-CM | POA: Diagnosis not present

## 2012-06-16 ENCOUNTER — Encounter: Payer: Self-pay | Admitting: Gynecology

## 2012-06-20 ENCOUNTER — Other Ambulatory Visit (HOSPITAL_COMMUNITY): Payer: Self-pay | Admitting: Cardiovascular Disease

## 2012-06-20 DIAGNOSIS — I824Z9 Acute embolism and thrombosis of unspecified deep veins of unspecified distal lower extremity: Secondary | ICD-10-CM

## 2012-06-23 ENCOUNTER — Encounter: Payer: Self-pay | Admitting: Family Medicine

## 2012-07-03 DIAGNOSIS — R51 Headache: Secondary | ICD-10-CM | POA: Diagnosis not present

## 2012-07-03 DIAGNOSIS — M531 Cervicobrachial syndrome: Secondary | ICD-10-CM | POA: Diagnosis not present

## 2012-07-03 DIAGNOSIS — M542 Cervicalgia: Secondary | ICD-10-CM | POA: Diagnosis not present

## 2012-07-03 DIAGNOSIS — G894 Chronic pain syndrome: Secondary | ICD-10-CM | POA: Diagnosis not present

## 2012-07-05 ENCOUNTER — Other Ambulatory Visit (HOSPITAL_COMMUNITY): Payer: Self-pay | Admitting: Cardiovascular Disease

## 2012-07-05 ENCOUNTER — Ambulatory Visit (HOSPITAL_COMMUNITY)
Admission: RE | Admit: 2012-07-05 | Discharge: 2012-07-05 | Disposition: A | Discharge status: Home / Self Care | Payer: Medicare Other | Source: Ambulatory Visit | Attending: Cardiovascular Disease | Admitting: Cardiovascular Disease

## 2012-07-05 DIAGNOSIS — I82409 Acute embolism and thrombosis of unspecified deep veins of unspecified lower extremity: Secondary | ICD-10-CM

## 2012-07-05 DIAGNOSIS — I824Z9 Acute embolism and thrombosis of unspecified deep veins of unspecified distal lower extremity: Secondary | ICD-10-CM | POA: Insufficient documentation

## 2012-07-05 DIAGNOSIS — M79609 Pain in unspecified limb: Secondary | ICD-10-CM | POA: Diagnosis not present

## 2012-07-05 DIAGNOSIS — M7989 Other specified soft tissue disorders: Secondary | ICD-10-CM | POA: Diagnosis not present

## 2012-07-05 NOTE — Progress Notes (Signed)
RLE venous duplex completed. Chronic DVT is still present in the distal popliteal and ptv. Chauncy Lean

## 2012-07-14 DIAGNOSIS — G500 Trigeminal neuralgia: Secondary | ICD-10-CM | POA: Diagnosis not present

## 2012-07-14 DIAGNOSIS — H04129 Dry eye syndrome of unspecified lacrimal gland: Secondary | ICD-10-CM | POA: Diagnosis not present

## 2012-07-21 DIAGNOSIS — E782 Mixed hyperlipidemia: Secondary | ICD-10-CM | POA: Diagnosis not present

## 2012-07-21 DIAGNOSIS — I82409 Acute embolism and thrombosis of unspecified deep veins of unspecified lower extremity: Secondary | ICD-10-CM | POA: Diagnosis not present

## 2012-07-24 ENCOUNTER — Other Ambulatory Visit (HOSPITAL_COMMUNITY): Payer: Self-pay | Admitting: Cardiovascular Disease

## 2012-07-24 DIAGNOSIS — I82409 Acute embolism and thrombosis of unspecified deep veins of unspecified lower extremity: Secondary | ICD-10-CM

## 2012-08-08 DIAGNOSIS — J0100 Acute maxillary sinusitis, unspecified: Secondary | ICD-10-CM | POA: Diagnosis not present

## 2012-08-21 DIAGNOSIS — H04129 Dry eye syndrome of unspecified lacrimal gland: Secondary | ICD-10-CM | POA: Diagnosis not present

## 2012-08-21 DIAGNOSIS — M542 Cervicalgia: Secondary | ICD-10-CM | POA: Diagnosis not present

## 2012-08-21 DIAGNOSIS — H01009 Unspecified blepharitis unspecified eye, unspecified eyelid: Secondary | ICD-10-CM | POA: Diagnosis not present

## 2012-08-21 DIAGNOSIS — M62838 Other muscle spasm: Secondary | ICD-10-CM | POA: Diagnosis not present

## 2012-08-21 DIAGNOSIS — G894 Chronic pain syndrome: Secondary | ICD-10-CM | POA: Diagnosis not present

## 2012-08-21 DIAGNOSIS — H0289 Other specified disorders of eyelid: Secondary | ICD-10-CM | POA: Diagnosis not present

## 2012-08-21 DIAGNOSIS — R51 Headache: Secondary | ICD-10-CM | POA: Diagnosis not present

## 2012-08-21 DIAGNOSIS — H526 Other disorders of refraction: Secondary | ICD-10-CM | POA: Diagnosis not present

## 2012-09-05 DIAGNOSIS — IMO0001 Reserved for inherently not codable concepts without codable children: Secondary | ICD-10-CM | POA: Diagnosis not present

## 2012-09-05 DIAGNOSIS — M3500 Sicca syndrome, unspecified: Secondary | ICD-10-CM | POA: Diagnosis not present

## 2012-09-05 DIAGNOSIS — M159 Polyosteoarthritis, unspecified: Secondary | ICD-10-CM | POA: Diagnosis not present

## 2012-09-05 DIAGNOSIS — M2550 Pain in unspecified joint: Secondary | ICD-10-CM | POA: Diagnosis not present

## 2012-09-12 ENCOUNTER — Encounter (HOSPITAL_COMMUNITY): Payer: Medicare Other

## 2012-09-13 ENCOUNTER — Ambulatory Visit (HOSPITAL_COMMUNITY)
Admission: RE | Admit: 2012-09-13 | Discharge: 2012-09-13 | Disposition: A | Discharge status: Home / Self Care | Payer: Medicare Other | Source: Ambulatory Visit | Attending: Cardiovascular Disease | Admitting: Cardiovascular Disease

## 2012-09-13 DIAGNOSIS — I82409 Acute embolism and thrombosis of unspecified deep veins of unspecified lower extremity: Secondary | ICD-10-CM | POA: Diagnosis not present

## 2012-09-13 DIAGNOSIS — M7989 Other specified soft tissue disorders: Secondary | ICD-10-CM | POA: Diagnosis not present

## 2012-09-13 NOTE — Progress Notes (Signed)
Right Lower Ext. Venous Duplex Completed. [name]  

## 2012-10-10 DIAGNOSIS — I82409 Acute embolism and thrombosis of unspecified deep veins of unspecified lower extremity: Secondary | ICD-10-CM | POA: Diagnosis not present

## 2012-10-10 DIAGNOSIS — R0609 Other forms of dyspnea: Secondary | ICD-10-CM | POA: Diagnosis not present

## 2012-10-10 DIAGNOSIS — E782 Mixed hyperlipidemia: Secondary | ICD-10-CM | POA: Diagnosis not present

## 2012-10-10 DIAGNOSIS — R0989 Other specified symptoms and signs involving the circulatory and respiratory systems: Secondary | ICD-10-CM | POA: Diagnosis not present

## 2012-10-11 ENCOUNTER — Other Ambulatory Visit (HOSPITAL_COMMUNITY): Payer: Self-pay | Admitting: Cardiology

## 2012-10-11 DIAGNOSIS — R079 Chest pain, unspecified: Secondary | ICD-10-CM

## 2012-10-11 DIAGNOSIS — R0602 Shortness of breath: Secondary | ICD-10-CM

## 2012-10-18 ENCOUNTER — Ambulatory Visit (HOSPITAL_COMMUNITY)
Admission: RE | Admit: 2012-10-18 | Discharge: 2012-10-18 | Disposition: A | Discharge status: Home / Self Care | Payer: Medicare Other | Source: Ambulatory Visit | Attending: Cardiovascular Disease | Admitting: Cardiovascular Disease

## 2012-10-18 DIAGNOSIS — J449 Chronic obstructive pulmonary disease, unspecified: Secondary | ICD-10-CM | POA: Insufficient documentation

## 2012-10-18 DIAGNOSIS — R079 Chest pain, unspecified: Secondary | ICD-10-CM | POA: Diagnosis not present

## 2012-10-18 DIAGNOSIS — J4489 Other specified chronic obstructive pulmonary disease: Secondary | ICD-10-CM | POA: Insufficient documentation

## 2012-10-18 DIAGNOSIS — R42 Dizziness and giddiness: Secondary | ICD-10-CM | POA: Insufficient documentation

## 2012-10-18 DIAGNOSIS — R0602 Shortness of breath: Secondary | ICD-10-CM | POA: Diagnosis not present

## 2012-10-18 MED ORDER — TECHNETIUM TC 99M SESTAMIBI GENERIC - CARDIOLITE
31.7000 | Freq: Once | INTRAVENOUS | Status: AC | PRN
  Administered 2012-10-18: 31.7 via INTRAVENOUS

## 2012-10-18 MED ORDER — TECHNETIUM TC 99M SESTAMIBI GENERIC - CARDIOLITE
10.1000 | Freq: Once | INTRAVENOUS | Status: AC | PRN
  Administered 2012-10-18: 10.1 via INTRAVENOUS

## 2012-10-18 MED ORDER — AMINOPHYLLINE 25 MG/ML IV SOLN
75.0000 mg | Freq: Once | INTRAVENOUS | Status: AC
Start: 2012-10-18 — End: 2012-10-18
  Administered 2012-10-18: 75 mg via INTRAVENOUS

## 2012-10-18 MED ORDER — REGADENOSON 0.4 MG/5ML IV SOLN
0.4000 mg | Freq: Once | INTRAVENOUS | Status: AC
Start: 2012-10-18 — End: 2012-10-18
  Administered 2012-10-18: 0.4 mg via INTRAVENOUS

## 2012-10-18 NOTE — Procedures (Addendum)
 [facility] [address] [place] [address] [phone]  Cardiology Nuclear Med Study  Ashley Castaneda is a 62 y.o. female     MRN : [id number]     DOB: February 17, 1951  Procedure Date: 10/18/2012  Nuclear Med Background Indication for Stress Test:  Evaluation for Ischemia History:  COPD and DVT'S Cardiac Risk Factors: Family History - CAD, History of Smoking, Lipids, Obesity and DVT'S  Symptoms:  Chest Pain, Dizziness, DOE, Fatigue, Light-Headedness and SOB   Nuclear Pre-Procedure Caffeine/Decaff Intake:  1:00am NPO After: 11 AM   IV Site: R Forearm  IV 0.9% NS with Angio Cath:  22g  Chest Size (in):  N/A IV Started by: Emmit Pomfret, RN  Height: 5\' 3"  (1.6 m)  Cup Size: C  BMI:  Body mass index is 39.16 kg/(m^2). Weight:  221 lb (100.245 kg)   Tech Comments:  N/A    Nuclear Med Study 1 or 2 day study: 1 day  Stress Test Type:  Lexiscan  Order Authorizing Provider:  Nanetta Batty, MD   Resting Radionuclide: Technetium 43m Sestamibi  Resting Radionuclide Dose:10.1 mCi   Stress Radionuclide:  Technetium 52m Sestamibi  Stress Radionuclide Dose: 31.7 mCi           Stress Protocol Rest HR: 66 Stress HR:121  Rest BP: 118/79 Stress BP: 135/71  Exercise Time (min): n/a METS: n/a          Dose of Adenosine (mg):  n/a Dose of Lexiscan: 0.4 mg  Dose of Atropine (mg): n/a Dose of Dobutamine: n/a mcg/kg/min (at max HR)  Stress Test Technologist: Ernestene Mention, CCT Nuclear Technologist: Gonzella Lex, CNMT   Rest Procedure:  Myocardial perfusion imaging was performed at rest 45 minutes following the intravenous administration of Technetium 51m Sestamibi. Stress Procedure:  The patient received IV Lexiscan 0.4 mg over 15-seconds.  Technetium 2m Sestamibi injected at 30-seconds.  Due to patient's shortness of breath, she was given IV Aminophylline 75 mg. Symptom was resolved during recovery. There were no significant changes  with Lexiscan.  Quantitative spect images were obtained after a 45 minute delay.  Transient Ischemic Dilatation (Normal <1.22):  1.34 Lung/Heart Ratio (Normal <0.45):  0.24 QGS EDV:  57 ml QGS ESV:  15 ml LV Ejection Fraction: 73%  Signed by      Rest ECG: NSR - Normal EKG  Stress ECG: No significant change from baseline ECG  QPS Raw Data Images:  Normal; no motion artifact; normal heart/lung ratio. Stress Images:  Normal homogeneous uptake in all areas of the myocardium. Rest Images:  Normal homogeneous uptake in all areas of the myocardium. Subtraction (SDS):  No evidence of ischemia.  Impression Exercise Capacity:  Lexiscan with no exercise. BP Response:  Normal blood pressure response. Clinical Symptoms:  No significant symptoms noted. ECG Impression:  No significant ST segment change suggestive of ischemia. Comparison with Prior Nuclear Study: No previous nuclear study performed  Overall Impression:  Normal stress nuclear study.  LV Wall Motion:  NL LV Function; NL Wall Motion   Runell Gess, MD  10/18/2012 5:36 PM

## 2012-10-19 DIAGNOSIS — H0289 Other specified disorders of eyelid: Secondary | ICD-10-CM | POA: Diagnosis not present

## 2012-10-19 DIAGNOSIS — H11009 Unspecified pterygium of unspecified eye: Secondary | ICD-10-CM | POA: Diagnosis not present

## 2012-10-19 DIAGNOSIS — H526 Other disorders of refraction: Secondary | ICD-10-CM | POA: Diagnosis not present

## 2012-10-19 DIAGNOSIS — H04129 Dry eye syndrome of unspecified lacrimal gland: Secondary | ICD-10-CM | POA: Diagnosis not present

## 2012-10-19 DIAGNOSIS — H01009 Unspecified blepharitis unspecified eye, unspecified eyelid: Secondary | ICD-10-CM | POA: Diagnosis not present

## 2012-10-19 DIAGNOSIS — F4323 Adjustment disorder with mixed anxiety and depressed mood: Secondary | ICD-10-CM | POA: Diagnosis not present

## 2012-10-24 DIAGNOSIS — R0602 Shortness of breath: Secondary | ICD-10-CM | POA: Diagnosis not present

## 2012-10-24 DIAGNOSIS — I82409 Acute embolism and thrombosis of unspecified deep veins of unspecified lower extremity: Secondary | ICD-10-CM | POA: Diagnosis not present

## 2012-11-02 DIAGNOSIS — IMO0001 Reserved for inherently not codable concepts without codable children: Secondary | ICD-10-CM | POA: Diagnosis not present

## 2012-11-02 DIAGNOSIS — M159 Polyosteoarthritis, unspecified: Secondary | ICD-10-CM | POA: Diagnosis not present

## 2012-11-02 DIAGNOSIS — M3500 Sicca syndrome, unspecified: Secondary | ICD-10-CM | POA: Diagnosis not present

## 2012-11-03 ENCOUNTER — Telehealth: Payer: Self-pay | Admitting: Family Medicine

## 2012-11-03 MED ORDER — ALPRAZOLAM 0.5 MG PO TABS: ORAL_TABLET | ORAL | Status: DC

## 2012-11-03 MED ORDER — SILODOSIN 8 MG PO CAPS: 8.0000 mg | ORAL_CAPSULE | Freq: Every day | ORAL | Status: DC

## 2012-11-03 NOTE — Telephone Encounter (Signed)
Pt informed will fill and fax/send

## 2012-11-03 NOTE — Telephone Encounter (Signed)
Patient is calling and asking if Dr. Caryl Never MD will refill her Silodosin/Rapaflo 8mg  po daily. It was prescribed by Kidney Specialist Alfredo Martinez MD.  She was seen by him for Leaking bladder and spasms. She has been on the medication for over a year and does not want to go back to the kidney specialist just for a refill.  She states that is should be sent to [facility].  Will Dr.Burchette do this? (2) She also needs a refill Xanax . (can she have 3 months at a time)?    Send to [facility]. Or you can mail it to her house and she will fill out the form and mail.   Please contact patient with any questions [phone]

## 2012-11-03 NOTE — Telephone Encounter (Signed)
We can refill her Rapaflo.  Refill Xanax for 6 months.

## 2012-11-07 DIAGNOSIS — IMO0002 Reserved for concepts with insufficient information to code with codable children: Secondary | ICD-10-CM | POA: Diagnosis not present

## 2012-11-07 DIAGNOSIS — G43119 Migraine with aura, intractable, without status migrainosus: Secondary | ICD-10-CM | POA: Diagnosis not present

## 2012-11-07 DIAGNOSIS — IMO0001 Reserved for inherently not codable concepts without codable children: Secondary | ICD-10-CM | POA: Diagnosis not present

## 2012-11-07 DIAGNOSIS — G43719 Chronic migraine without aura, intractable, without status migrainosus: Secondary | ICD-10-CM | POA: Diagnosis not present

## 2012-11-09 DIAGNOSIS — M25569 Pain in unspecified knee: Secondary | ICD-10-CM | POA: Diagnosis not present

## 2012-11-14 ENCOUNTER — Other Ambulatory Visit: Payer: Self-pay | Admitting: Orthopedic Surgery

## 2012-11-14 DIAGNOSIS — M25512 Pain in left shoulder: Secondary | ICD-10-CM

## 2012-11-21 ENCOUNTER — Ambulatory Visit
Admission: RE | Admit: 2012-11-21 | Discharge: 2012-11-21 | Disposition: A | Discharge status: Home / Self Care | Payer: Medicare Other | Source: Ambulatory Visit | Attending: Orthopedic Surgery | Admitting: Orthopedic Surgery

## 2012-11-21 DIAGNOSIS — M25512 Pain in left shoulder: Secondary | ICD-10-CM

## 2012-11-21 DIAGNOSIS — M719 Bursopathy, unspecified: Secondary | ICD-10-CM | POA: Diagnosis not present

## 2012-11-21 DIAGNOSIS — M19019 Primary osteoarthritis, unspecified shoulder: Secondary | ICD-10-CM | POA: Diagnosis not present

## 2012-11-21 DIAGNOSIS — M25519 Pain in unspecified shoulder: Secondary | ICD-10-CM | POA: Diagnosis not present

## 2012-11-21 MED ORDER — IOHEXOL 180 MG/ML  SOLN
13.0000 mL | Freq: Once | INTRAMUSCULAR | Status: AC | PRN
  Administered 2012-11-21: 13 mL via INTRA_ARTICULAR

## 2012-11-27 DIAGNOSIS — M19019 Primary osteoarthritis, unspecified shoulder: Secondary | ICD-10-CM | POA: Diagnosis not present

## 2012-12-04 DIAGNOSIS — M25519 Pain in unspecified shoulder: Secondary | ICD-10-CM | POA: Diagnosis not present

## 2012-12-11 DIAGNOSIS — M25519 Pain in unspecified shoulder: Secondary | ICD-10-CM | POA: Diagnosis not present

## 2012-12-12 DIAGNOSIS — M531 Cervicobrachial syndrome: Secondary | ICD-10-CM | POA: Diagnosis not present

## 2012-12-15 DIAGNOSIS — G500 Trigeminal neuralgia: Secondary | ICD-10-CM | POA: Diagnosis not present

## 2012-12-15 DIAGNOSIS — H11009 Unspecified pterygium of unspecified eye: Secondary | ICD-10-CM | POA: Diagnosis not present

## 2012-12-15 DIAGNOSIS — H04129 Dry eye syndrome of unspecified lacrimal gland: Secondary | ICD-10-CM | POA: Diagnosis not present

## 2012-12-15 DIAGNOSIS — H01009 Unspecified blepharitis unspecified eye, unspecified eyelid: Secondary | ICD-10-CM | POA: Diagnosis not present

## 2012-12-15 DIAGNOSIS — H0289 Other specified disorders of eyelid: Secondary | ICD-10-CM | POA: Diagnosis not present

## 2012-12-15 DIAGNOSIS — H526 Other disorders of refraction: Secondary | ICD-10-CM | POA: Diagnosis not present

## 2013-01-08 DIAGNOSIS — M25519 Pain in unspecified shoulder: Secondary | ICD-10-CM | POA: Diagnosis not present

## 2013-01-08 DIAGNOSIS — M75100 Unspecified rotator cuff tear or rupture of unspecified shoulder, not specified as traumatic: Secondary | ICD-10-CM | POA: Diagnosis not present

## 2013-01-08 DIAGNOSIS — M62838 Other muscle spasm: Secondary | ICD-10-CM | POA: Diagnosis not present

## 2013-01-08 DIAGNOSIS — M7120 Synovial cyst of popliteal space [Baker], unspecified knee: Secondary | ICD-10-CM | POA: Diagnosis not present

## 2013-01-08 DIAGNOSIS — M79609 Pain in unspecified limb: Secondary | ICD-10-CM | POA: Diagnosis not present

## 2013-01-10 ENCOUNTER — Other Ambulatory Visit (HOSPITAL_COMMUNITY): Payer: Self-pay | Admitting: Orthopedic Surgery

## 2013-01-10 ENCOUNTER — Other Ambulatory Visit (HOSPITAL_COMMUNITY): Payer: Self-pay | Admitting: *Deleted

## 2013-01-10 DIAGNOSIS — I82401 Acute embolism and thrombosis of unspecified deep veins of right lower extremity: Secondary | ICD-10-CM

## 2013-01-10 DIAGNOSIS — M7121 Synovial cyst of popliteal space [Baker], right knee: Secondary | ICD-10-CM

## 2013-01-11 ENCOUNTER — Ambulatory Visit (HOSPITAL_COMMUNITY)
Admission: RE | Admit: 2013-01-11 | Discharge: 2013-01-11 | Disposition: A | Discharge status: Home / Self Care | Payer: Medicare Other | Source: Ambulatory Visit | Attending: Cardiovascular Disease | Admitting: Cardiovascular Disease

## 2013-01-11 DIAGNOSIS — I825Y9 Chronic embolism and thrombosis of unspecified deep veins of unspecified proximal lower extremity: Secondary | ICD-10-CM | POA: Insufficient documentation

## 2013-01-11 DIAGNOSIS — M7121 Synovial cyst of popliteal space [Baker], right knee: Secondary | ICD-10-CM

## 2013-01-11 DIAGNOSIS — M7120 Synovial cyst of popliteal space [Baker], unspecified knee: Secondary | ICD-10-CM | POA: Insufficient documentation

## 2013-01-11 DIAGNOSIS — I82409 Acute embolism and thrombosis of unspecified deep veins of unspecified lower extremity: Secondary | ICD-10-CM

## 2013-01-11 DIAGNOSIS — I82401 Acute embolism and thrombosis of unspecified deep veins of right lower extremity: Secondary | ICD-10-CM

## 2013-01-11 DIAGNOSIS — M79609 Pain in unspecified limb: Secondary | ICD-10-CM | POA: Insufficient documentation

## 2013-01-11 NOTE — Progress Notes (Signed)
Right Lower Extremity Venous Duplex Completed. Thrombus seen in the distal popliteal vein, which has been noted on previous studies. Erlene Quan

## 2013-01-15 DIAGNOSIS — M25569 Pain in unspecified knee: Secondary | ICD-10-CM | POA: Diagnosis not present

## 2013-01-16 DIAGNOSIS — G894 Chronic pain syndrome: Secondary | ICD-10-CM | POA: Diagnosis not present

## 2013-01-16 DIAGNOSIS — R51 Headache: Secondary | ICD-10-CM | POA: Diagnosis not present

## 2013-01-16 DIAGNOSIS — M542 Cervicalgia: Secondary | ICD-10-CM | POA: Diagnosis not present

## 2013-01-29 ENCOUNTER — Encounter: Payer: Self-pay | Admitting: Family Medicine

## 2013-01-29 ENCOUNTER — Ambulatory Visit (INDEPENDENT_AMBULATORY_CARE_PROVIDER_SITE_OTHER): Payer: Medicare Other | Admitting: Family Medicine

## 2013-01-29 VITALS — BP 114/80 | Temp 97.8°F | Wt 230.0 lb

## 2013-01-29 DIAGNOSIS — J3489 Other specified disorders of nose and nasal sinuses: Secondary | ICD-10-CM | POA: Diagnosis not present

## 2013-01-29 DIAGNOSIS — R635 Abnormal weight gain: Secondary | ICD-10-CM | POA: Diagnosis not present

## 2013-01-29 MED ORDER — MUPIROCIN 2 % EX OINT: TOPICAL_OINTMENT | Freq: Two times a day (BID) | CUTANEOUS | Status: DC

## 2013-01-29 NOTE — Progress Notes (Signed)
Subjective:    Patient ID: Ashley Castaneda, female    DOB: 28-Oct-1950, 62 y.o.   MRN: [id number]  HPI Patient seen to evaluate possible "sores" in her nose. Symptoms have been intermittent for one month. She has not actually seen any sores. She's had one mild Episode of bright red blood per nose blowing but not over the past few days. No recent purulent secretions. She also describes what sounds like some recent aphthous ulcers in her mouth which eventually cleared. She has some mild soreness under the tongue but no visible ulcers at this time. No recent skin ulcers. No illicit drug use  Patient complains of some weight gain and fatigue and some cold intolerance over recent months. Occasional constipation. Strong family history of hypothyroidism. Last TSH was 2012. She's had recent weight gain of about 24 pounds in recent months  Chronic headaches and scheduled for occipital nerve stimulator implant tomorrow morning. No recent fever or chills  Past Medical History  Diagnosis Date  . Unspecified vitamin D deficiency 03/31/2010  . ANXIETY 07/01/2008  . DEPRESSION 07/01/2008  . REFLEX SYMPATHETIC DYSTROPHY 07/01/2008  . Trigeminal neuralgia 05/21/2009  . Other specified trigeminal nerve disorders 07/01/2008  . ALLERGIC RHINITIS 07/01/2008  . BRONCHITIS, CHRONIC 07/01/2008  . GERD 07/01/2008  . ECZEMA 05/29/2009  . OSTEOARTHRITIS 07/01/2008  . OCCIPITAL NEURALGIA 07/01/2008  . BACK PAIN, THORACIC REGION 03/04/2010  . FIBROMYALGIA 07/01/2008  . LEG PAIN, BILATERAL 07/01/2008  . FACIAL PAIN 12/25/2009  . CARPAL TUNNEL SYNDROME, HX OF 07/01/2008  . DVT, HX OF 07/01/2008  . MIGRAINES, HX OF 07/01/2008  . CHRONIC OBSTRUCTIVE PULMONARY DISEASE, ACUTE EXACERBATION 08/07/2010   Past Surgical History  Procedure Laterality Date  . Lavh      BSO  . Tonsillectomy  1957  . Right foot bone spur  1987  . Left knee surgery  1989  . Right knee surgery  1990  . Right soulder surgery  1992, 1993, 1995, 1999  . Head  craniectomy  1994    MICROVASULAR DECOMPRESSION  . Right foot surgery  1998    BAD CUT  . Right jaw surgery  2002  . Right thumb sugery  2004  . Carpal tunnel release  2009    RIGHT    reports that she quit smoking about a year ago. Her smoking use included Cigarettes. She has a 60 pack-year smoking history. She has never used smokeless tobacco. She reports that she does not drink alcohol or use illicit drugs. family history includes Cancer in her father; Cancer (age of onset: 28) in her mother; Hypertension in her father; Ovarian cancer in her mother; and Prostate cancer in her father. Allergies  Allergen Reactions  . Cefazolin     Ancef - in surgery, swelled up, turned blue, heart problems  . Baclofen     REACTION: confusion  . Carbamazepine     REACTION: confusion  . Ceclor (Cefaclor)     hives  . Fentanyl     REACTION: nausea and vomiting  . Gabapentin     REACTION: confusion  . Oxcarbazepine     REACTION: confusion      Review of Systems  Constitutional: Positive for fatigue and unexpected weight change (Weight gain). Negative for fever and chills.  HENT: Positive for congestion.   Respiratory: Negative for cough and shortness of breath.   Cardiovascular: Negative for chest pain.  Gastrointestinal: Positive for constipation. Negative for nausea and vomiting.  Endocrine: Positive for cold intolerance.  Neurological:  Positive for headaches.       Objective:   Physical Exam  Constitutional: She appears well-developed and well-nourished.  HENT:  Right Ear: External ear normal.  Left Ear: External ear normal.  Mouth/Throat: Oropharynx is clear and moist.  No nasal discharge. Appears to have very small perforation septum right side with very small pinpoint hole which does not clearly communicate the left side. No blood clots. No ulcerations noted  Oropharynx reveals no lesions  Neck: Neck supple. No thyromegaly present.  Cardiovascular: Normal rate and regular  rhythm.   Pulmonary/Chest: Effort normal and breath sounds normal. No respiratory distress. She has no wheezes. She has no rales.  Musculoskeletal: She exhibits no edema.  Lymphadenopathy:    She has no cervical adenopathy.          Assessment & Plan:  #1 recent nasal soreness. No acute findings on exam. Bactroban ointment intranasally twice a day for 5 days. Followup promptly for any recurrent nosebleeds. #2 weight gain. She's also had some recent constipation and cold intolerance and very strong family history of hypothyroidism. Recheck TSH.

## 2013-01-30 DIAGNOSIS — G894 Chronic pain syndrome: Secondary | ICD-10-CM | POA: Diagnosis not present

## 2013-01-30 DIAGNOSIS — R51 Headache: Secondary | ICD-10-CM | POA: Diagnosis not present

## 2013-01-30 LAB — TSH: TSH: 1.37 u[IU]/mL (ref 0.35–5.50)

## 2013-02-06 DIAGNOSIS — M531 Cervicobrachial syndrome: Secondary | ICD-10-CM | POA: Diagnosis not present

## 2013-02-06 DIAGNOSIS — M542 Cervicalgia: Secondary | ICD-10-CM | POA: Diagnosis not present

## 2013-02-06 DIAGNOSIS — G894 Chronic pain syndrome: Secondary | ICD-10-CM | POA: Diagnosis not present

## 2013-02-06 DIAGNOSIS — R51 Headache: Secondary | ICD-10-CM | POA: Diagnosis not present

## 2013-02-13 DIAGNOSIS — G43719 Chronic migraine without aura, intractable, without status migrainosus: Secondary | ICD-10-CM | POA: Diagnosis not present

## 2013-02-13 DIAGNOSIS — IMO0001 Reserved for inherently not codable concepts without codable children: Secondary | ICD-10-CM | POA: Diagnosis not present

## 2013-02-13 DIAGNOSIS — G500 Trigeminal neuralgia: Secondary | ICD-10-CM | POA: Diagnosis not present

## 2013-02-13 DIAGNOSIS — IMO0002 Reserved for concepts with insufficient information to code with codable children: Secondary | ICD-10-CM | POA: Diagnosis not present

## 2013-02-15 DIAGNOSIS — G939 Disorder of brain, unspecified: Secondary | ICD-10-CM | POA: Diagnosis not present

## 2013-02-15 DIAGNOSIS — G500 Trigeminal neuralgia: Secondary | ICD-10-CM | POA: Diagnosis not present

## 2013-02-15 DIAGNOSIS — H01009 Unspecified blepharitis unspecified eye, unspecified eyelid: Secondary | ICD-10-CM | POA: Diagnosis not present

## 2013-02-15 DIAGNOSIS — H11009 Unspecified pterygium of unspecified eye: Secondary | ICD-10-CM | POA: Diagnosis not present

## 2013-02-15 DIAGNOSIS — H04129 Dry eye syndrome of unspecified lacrimal gland: Secondary | ICD-10-CM | POA: Diagnosis not present

## 2013-02-15 DIAGNOSIS — H2510 Age-related nuclear cataract, unspecified eye: Secondary | ICD-10-CM | POA: Diagnosis not present

## 2013-02-15 DIAGNOSIS — H0289 Other specified disorders of eyelid: Secondary | ICD-10-CM | POA: Diagnosis not present

## 2013-02-21 DIAGNOSIS — G4710 Hypersomnia, unspecified: Secondary | ICD-10-CM | POA: Diagnosis not present

## 2013-04-06 DIAGNOSIS — M531 Cervicobrachial syndrome: Secondary | ICD-10-CM | POA: Diagnosis not present

## 2013-04-06 DIAGNOSIS — Z87891 Personal history of nicotine dependence: Secondary | ICD-10-CM | POA: Diagnosis not present

## 2013-04-06 DIAGNOSIS — Z888 Allergy status to other drugs, medicaments and biological substances status: Secondary | ICD-10-CM | POA: Diagnosis not present

## 2013-04-06 DIAGNOSIS — G500 Trigeminal neuralgia: Secondary | ICD-10-CM | POA: Diagnosis not present

## 2013-04-06 DIAGNOSIS — R51 Headache: Secondary | ICD-10-CM | POA: Diagnosis not present

## 2013-04-06 DIAGNOSIS — Z9889 Other specified postprocedural states: Secondary | ICD-10-CM | POA: Diagnosis not present

## 2013-04-06 DIAGNOSIS — Z7982 Long term (current) use of aspirin: Secondary | ICD-10-CM | POA: Diagnosis not present

## 2013-05-10 ENCOUNTER — Other Ambulatory Visit: Payer: Self-pay | Admitting: *Deleted

## 2013-05-10 ENCOUNTER — Ambulatory Visit
Admission: RE | Admit: 2013-05-10 | Discharge: 2013-05-10 | Disposition: A | Discharge status: Home / Self Care | Payer: Medicare Other | Source: Ambulatory Visit | Attending: *Deleted | Admitting: *Deleted

## 2013-05-10 DIAGNOSIS — R930 Abnormal findings on diagnostic imaging of skull and head, not elsewhere classified: Secondary | ICD-10-CM | POA: Diagnosis not present

## 2013-05-10 DIAGNOSIS — M542 Cervicalgia: Secondary | ICD-10-CM | POA: Diagnosis not present

## 2013-05-14 ENCOUNTER — Telehealth: Payer: Self-pay | Admitting: Family Medicine

## 2013-05-14 NOTE — Telephone Encounter (Signed)
Xanax  Last visit 01/29/13 Last refill 11/03/12 #270 1 refill

## 2013-05-14 NOTE — Telephone Encounter (Signed)
Pt called to schedule her CPX for next month, and request refills of the following medications; RABEprazole (ACIPHEX) 20 MG tablet, montelukast (SINGULAIR) 10 MG tablet, and ALPRAZolam (XANAX) 0.5 MG tablet. She would like these sent to [facility] by [facility]. Please assist. (fax; [phone]) Please assist. !

## 2013-05-15 MED ORDER — RABEPRAZOLE SODIUM 20 MG PO TBEC: 20.0000 mg | DELAYED_RELEASE_TABLET | ORAL | Status: DC

## 2013-05-15 MED ORDER — MONTELUKAST SODIUM 10 MG PO TABS: 10.0000 mg | ORAL_TABLET | Freq: Every day | ORAL | Status: DC

## 2013-05-15 MED ORDER — ALPRAZOLAM 0.5 MG PO TABS: ORAL_TABLET | ORAL | Status: DC

## 2013-05-15 NOTE — Telephone Encounter (Signed)
Faxed RX to pharmacy.  

## 2013-05-15 NOTE — Telephone Encounter (Signed)
Refill for 6 months. 

## 2013-05-30 ENCOUNTER — Encounter: Payer: Self-pay | Admitting: Internal Medicine

## 2013-05-30 ENCOUNTER — Ambulatory Visit (INDEPENDENT_AMBULATORY_CARE_PROVIDER_SITE_OTHER): Payer: Medicare Other | Admitting: Internal Medicine

## 2013-05-30 VITALS — BP 124/76 | HR 86 | Temp 98.5°F | Wt 232.0 lb

## 2013-05-30 DIAGNOSIS — R059 Cough, unspecified: Secondary | ICD-10-CM

## 2013-05-30 DIAGNOSIS — R200 Anesthesia of skin: Secondary | ICD-10-CM | POA: Insufficient documentation

## 2013-05-30 DIAGNOSIS — J449 Chronic obstructive pulmonary disease, unspecified: Secondary | ICD-10-CM | POA: Diagnosis not present

## 2013-05-30 DIAGNOSIS — R209 Unspecified disturbances of skin sensation: Secondary | ICD-10-CM

## 2013-05-30 DIAGNOSIS — R05 Cough: Secondary | ICD-10-CM

## 2013-05-30 DIAGNOSIS — IMO0001 Reserved for inherently not codable concepts without codable children: Secondary | ICD-10-CM

## 2013-05-30 DIAGNOSIS — J4489 Other specified chronic obstructive pulmonary disease: Secondary | ICD-10-CM

## 2013-05-30 MED ORDER — DOXYCYCLINE HYCLATE 100 MG PO CAPS: 100.0000 mg | ORAL_CAPSULE | Freq: Two times a day (BID) | ORAL | Status: DC

## 2013-05-30 NOTE — Progress Notes (Signed)
Chief Complaint  Patient presents with  . chest congestion    Cough is productive of a beige/yellow sputum.  Sx started on Sunday.  Has tried Mucinex, vinegar, cinnamon, and Flonase.  . Nasal Congestion  . Cough  . Fever    HPI: Patient comes in today for SDA for  new problem evaluation. pcpNA  Onset about 3 days ago of above sx and concerned going to chest and causing"bronchitis" or AECOPD that last for a long tim.  Currently no sob beige colorred phlegm no face pain some congestion  Ex tobacco .  Cold and chills.    husand . Not sick Is 6 weeks out from implantation nerve stimulator right upper chest .   recnet numbness in index fingers hand  Has appt next week  Surgical site is sore. Not increasing redness  ROS: See pertinent positives and negatives per HPI. Had rash with ceclor and blue under anesthesia with ancef   Can take amox and pcn. Past Medical History  Diagnosis Date  . Unspecified vitamin D deficiency 03/31/2010  . ANXIETY 07/01/2008  . DEPRESSION 07/01/2008  . REFLEX SYMPATHETIC DYSTROPHY 07/01/2008  . Trigeminal neuralgia 05/21/2009  . Other specified trigeminal nerve disorders 07/01/2008  . ALLERGIC RHINITIS 07/01/2008  . BRONCHITIS, CHRONIC 07/01/2008  . GERD 07/01/2008  . ECZEMA 05/29/2009  . OSTEOARTHRITIS 07/01/2008  . OCCIPITAL NEURALGIA 07/01/2008  . BACK PAIN, THORACIC REGION 03/04/2010  . FIBROMYALGIA 07/01/2008  . LEG PAIN, BILATERAL 07/01/2008  . FACIAL PAIN 12/25/2009  . CARPAL TUNNEL SYNDROME, HX OF 07/01/2008  . DVT, HX OF 07/01/2008  . MIGRAINES, HX OF 07/01/2008  . CHRONIC OBSTRUCTIVE PULMONARY DISEASE, ACUTE EXACERBATION 08/07/2010    Family History  Problem Relation Age of Onset  . Ovarian cancer Mother   . Cancer Mother 4    ovarian   . Hypertension Father   . Prostate cancer Father   . Cancer Father     PROSTATE    History   Social History  . Marital Status: Married    Spouse Name: N/A    Number of Children: N/A  . Years of Education: N/A    Social History Main Topics  . Smoking status: Former Smoker -- 2.00 packs/day for 30 years    Types: Cigarettes    Quit date: 02/04/2012  . Smokeless tobacco: Never Used  . Alcohol Use: No  . Drug Use: No  . Sexual Activity: No   Other Topics Concern  . None   Social History Narrative  . None    Outpatient Encounter Prescriptions as of 05/30/2013  Medication Sig  . ALPRAZolam (XANAX) 0.5 MG tablet One po tid prn  . calcium carbonate (OS-CAL - DOSED IN MG OF ELEMENTAL CALCIUM) 1250 MG tablet Take 1 tablet by mouth daily.  . cetirizine (ZYRTEC) 10 MG tablet Take 10 mg by mouth daily.    . Cinnamon 500 MG capsule Take 1,000 mg by mouth daily.   . cycloSPORINE (RESTASIS) 0.05 % ophthalmic emulsion Place 1 drop into both eyes 2 (two) times daily.  . fluticasone (FLONASE) 50 MCG/ACT nasal spray Place 2 sprays into the nose daily as needed. For nasal congestion  . frovatriptan (FROVA) 2.5 MG tablet Take 2.5 mg by mouth as needed. If recurs, may repeat after 2 hours. Max of 3 tabs in 24 hours. For migraines.  . furosemide (LASIX) 20 MG tablet Take 1 tablet (20 mg total) by mouth daily. For swelling  . guaiFENesin (MUCINEX) 600 MG 12 hr tablet  Take 1,200 mg by mouth 2 (two) times daily.  . hydrOXYzine (ATARAX) 10 MG tablet Take 10 mg by mouth every 8 (eight) hours as needed. For dizziness per Berneice Gandy, MD  . lidocaine (LIDODERM) 5 % Place 1 patch onto the skin daily. Remove & Discard patch within 12 hours or as directed by Berneice Gandy MD  . metaxalone (SKELAXIN) 800 MG tablet Take 800 mg by mouth 3 (three) times daily.    . metoclopramide (REGLAN) 10 MG tablet Take 10 mg by mouth 3 (three) times daily as needed. For nausea  . montelukast (SINGULAIR) 10 MG tablet Take 1 tablet (10 mg total) by mouth daily at 6 PM.  . Multiple Vitamin (MULTIVITAMIN) tablet Take 1 tablet by mouth every morning.   . mupirocin ointment (BACTROBAN) 2 % Apply topically 2 (two) times daily.  . Omega-3 Fatty  Acids (FISH OIL) 1200 MG CAPS Take 1 capsule by mouth every evening.  . potassium chloride (K-DUR) 10 MEQ tablet Take 1 tablet (10 mEq total) by mouth daily as needed.  . RABEprazole (ACIPHEX) 20 MG tablet Take 1 tablet (20 mg total) by mouth every morning.  . silodosin (RAPAFLO) 8 MG CAPS capsule Take 1 capsule (8 mg total) by mouth daily with breakfast.  . tiZANidine (ZANAFLEX) 2 MG tablet Take 2 mg by mouth at bedtime.  . vitamin E 400 UNIT capsule Take 400 Units by mouth daily at 6 PM.   . doxycycline (VIBRAMYCIN) 100 MG capsule Take 1 capsule (100 mg total) by mouth 2 (two) times daily.  . [DISCONTINUED] PARoxetine (PAXIL) 10 MG tablet Take 1 tablet (10 mg total) by mouth every morning.  . [DISCONTINUED] pseudoephedrine-acetaminophen (TYLENOL SINUS) 30-500 MG TABS Take 1 tablet by mouth every 4 (four) hours as needed.    EXAM:  BP 124/76  Pulse 86  Temp(Src) 98.5 F (36.9 C) (Oral)  Wt 232 lb (105.235 kg)  SpO2 96%  LMP 07/26/1992  Body mass index is 41.11 kg/(m^2). WDWN in NAD  quiet respirations; mildly congested  somewhat hoarse. Non toxic . HEENT: Normocephalic ;atraumatic , Eyes;  PERRL, EOMs  Full, lids and conjunctiva clear,,Ears: no deformities, canals nl, TM landmarks normal, Nose: no deformity or discharge but congested;face nontender Mouth : OP clear without lesion or edema . Thick yellow white PND in post pharynx  Neck: without adenopathy or masses or bruits well healed scar Chest:  Clear to A&P without wheezes rales or rhonchi RUC healed scar  Some surroundly erythema no fluctuance streaking  Axilla no adenopathy  Right hand  Nl rom no swelling CV:  S1-S2 no gallops or murmurs peripheral perfusion is normal Skin :nl perfusion and no acute rashes  PSYCH: pleasant and cooperative, no obvious depression or anxiety ASSESSMENT AND PLAN:  Discussed the following assessment and plan:  Cough  COPD bronchitis  Numbness and tingling in right hand - check with neuro in  [place] about sx  after nerves stimulator inplant Acute resp infection at this time  Seems viral although has very thick PND   Pt concerned about copd etc  rx ggiven to hold onto for antibiotic    Continue sx rx mucinex etc   FU with neuro pain clinic about other sx  Stay tobacco free . -Patient advised to return or notify health care team  if symptoms worsen or persist or new concerns arise.  Patient Instructions  This acts like a viral respiratory infection but if feel getting   Flare of the copd with  thicke infected phlegm  can take   Antibiotic.  Have the pain doctor   Be aware of the numbness issue .  Get a thermometer to be able to tell if getting a fever.   Neta Mends. Tiron Suski M.D.

## 2013-05-30 NOTE — Patient Instructions (Signed)
This acts like a viral respiratory infection but if feel getting   Flare of the copd with thicke infected phlegm  can take   Antibiotic.  Have the pain doctor   Be aware of the numbness issue .  Get a thermometer to be able to tell if getting a fever.

## 2013-06-05 DIAGNOSIS — M2550 Pain in unspecified joint: Secondary | ICD-10-CM | POA: Diagnosis not present

## 2013-06-05 DIAGNOSIS — H04129 Dry eye syndrome of unspecified lacrimal gland: Secondary | ICD-10-CM | POA: Diagnosis not present

## 2013-06-05 DIAGNOSIS — M531 Cervicobrachial syndrome: Secondary | ICD-10-CM | POA: Diagnosis not present

## 2013-06-05 DIAGNOSIS — H01009 Unspecified blepharitis unspecified eye, unspecified eyelid: Secondary | ICD-10-CM | POA: Diagnosis not present

## 2013-06-05 DIAGNOSIS — H0289 Other specified disorders of eyelid: Secondary | ICD-10-CM | POA: Diagnosis not present

## 2013-06-05 DIAGNOSIS — R51 Headache: Secondary | ICD-10-CM | POA: Diagnosis not present

## 2013-06-05 DIAGNOSIS — G500 Trigeminal neuralgia: Secondary | ICD-10-CM | POA: Diagnosis not present

## 2013-06-05 DIAGNOSIS — H2510 Age-related nuclear cataract, unspecified eye: Secondary | ICD-10-CM | POA: Diagnosis not present

## 2013-06-05 DIAGNOSIS — G894 Chronic pain syndrome: Secondary | ICD-10-CM | POA: Diagnosis not present

## 2013-06-05 DIAGNOSIS — G501 Atypical facial pain: Secondary | ICD-10-CM | POA: Diagnosis not present

## 2013-06-11 ENCOUNTER — Ambulatory Visit (INDEPENDENT_AMBULATORY_CARE_PROVIDER_SITE_OTHER): Payer: Medicare Other | Admitting: Gynecology

## 2013-06-11 ENCOUNTER — Encounter: Payer: Self-pay | Admitting: Gynecology

## 2013-06-11 DIAGNOSIS — N644 Mastodynia: Secondary | ICD-10-CM | POA: Diagnosis not present

## 2013-06-11 NOTE — Patient Instructions (Signed)
This will call you to arrange mammography and ultrasound.

## 2013-06-11 NOTE — Progress Notes (Signed)
Patient presents with approximately 6 weeks of left lateral breast discomfort. No masses or discharge. Had right brain stimulator placed about 6 weeks ago requiring her to sleep on her left side and she's wondering whether this is because some of the discomfort.  Exam with Berenice Bouton Both breasts examined lying and sitting without masses retractions discharge adenopathy.  Assessment and plan: Reported tenderness left tail of Spence without findings on physical exam. Suspect due to sleeping on the side which is new to her of the past 6 weeks. Recommend diagnostic mammography and ultrasound over this area. If normal then plan expected management for now. Otherwise triaged based on results. Patient knows that my office will arrange and to expect a phone call from [place].

## 2013-06-12 ENCOUNTER — Telehealth: Payer: Self-pay | Admitting: *Deleted

## 2013-06-12 NOTE — Telephone Encounter (Signed)
appt 06/18/13 @ 10:15 am order faxed. Informed pt husband with this information.

## 2013-06-12 NOTE — Telephone Encounter (Signed)
Message copied by Aura Camps on Tue Jun 12, 2013 10:04 AM ------      Message from: Dara Lords      Created: Mon Jun 11, 2013  4:26 PM       Arrange diagnostic mammography and ultrasound at [facility] reference persistent left tail of Spence pain. Physical exam is normal. ------

## 2013-06-13 ENCOUNTER — Encounter: Payer: Self-pay | Admitting: *Deleted

## 2013-06-14 ENCOUNTER — Encounter: Payer: Self-pay | Admitting: Family Medicine

## 2013-06-14 ENCOUNTER — Ambulatory Visit (INDEPENDENT_AMBULATORY_CARE_PROVIDER_SITE_OTHER): Payer: Medicare Other | Admitting: Family Medicine

## 2013-06-14 VITALS — BP 128/80 | HR 91 | Temp 97.9°F | Ht 63.0 in | Wt 231.0 lb

## 2013-06-14 DIAGNOSIS — R635 Abnormal weight gain: Secondary | ICD-10-CM

## 2013-06-14 DIAGNOSIS — R5381 Other malaise: Secondary | ICD-10-CM

## 2013-06-14 DIAGNOSIS — I10 Essential (primary) hypertension: Secondary | ICD-10-CM | POA: Diagnosis not present

## 2013-06-14 DIAGNOSIS — Z0000 Encounter for general adult medical examination without abnormal findings: Secondary | ICD-10-CM

## 2013-06-14 DIAGNOSIS — Z23 Encounter for immunization: Secondary | ICD-10-CM | POA: Diagnosis not present

## 2013-06-14 DIAGNOSIS — Z79899 Other long term (current) drug therapy: Secondary | ICD-10-CM | POA: Diagnosis not present

## 2013-06-14 DIAGNOSIS — E785 Hyperlipidemia, unspecified: Secondary | ICD-10-CM | POA: Diagnosis not present

## 2013-06-14 DIAGNOSIS — R5383 Other fatigue: Secondary | ICD-10-CM

## 2013-06-14 LAB — BASIC METABOLIC PANEL
CO2: 26 mEq/L (ref 19–32)
Calcium: 9.1 mg/dL (ref 8.4–10.5)
Creatinine, Ser: 0.9 mg/dL (ref 0.4–1.2)
GFR: 69.33 mL/min (ref >60.00)
Glucose, Bld: 91 mg/dL (ref 70–99)

## 2013-06-14 LAB — HEPATIC FUNCTION PANEL
ALT: 17 U/L (ref 0–35)
AST: 20 U/L (ref 0–37)
Albumin: 4 g/dL (ref 3.5–5.2)
Total Protein: 7.3 g/dL (ref 6.0–8.3)

## 2013-06-14 LAB — CBC WITH DIFFERENTIAL/PLATELET
Basophils Relative: 1.4 % (ref 0.0–3.0)
Eosinophils Relative: 3.9 % (ref 0.0–5.0)
HCT: 38 % (ref 36.0–46.0)
Lymphs Abs: 1.7 10*3/uL (ref 0.7–4.0)
MCV: 88.1 fl (ref 78.0–100.0)
Monocytes Absolute: 0.5 10*3/uL (ref 0.1–1.0)
RBC: 4.32 Mil/uL (ref 3.87–5.11)
WBC: 5.8 10*3/uL (ref 4.5–10.5)

## 2013-06-14 LAB — LIPID PANEL: Triglycerides: 156 mg/dL — ABNORMAL HIGH (ref 0.0–149.0)

## 2013-06-14 LAB — LDL CHOLESTEROL, DIRECT: Direct LDL: 149.8 mg/dL

## 2013-06-14 NOTE — Patient Instructions (Signed)
Check on insurance coverage for shingles vaccine and let us know if you're interested in scheduling this Suggest tracking tool for calories consumed and expenditures such as "Myfitnesspal" Let me know if interested in pursuing setting up appointment with nutritionist

## 2013-06-14 NOTE — Progress Notes (Signed)
Pre visit review using our clinic review tool, if applicable. No additional management support is needed unless otherwise documented below in the visit note. 

## 2013-06-14 NOTE — Progress Notes (Signed)
Subjective:    Patient ID: Ashley Castaneda, female    DOB: 11/18/1950, 62 y.o.   MRN: [id number]  HPI Patient here for Medicare wellness exam and medical follow up. She has complex past medical history with history of COPD, hyperlipidemia, allergic rhinitis, history of depression, history of DVT, GERD, chronic cervical and lumbar back pain followed by pain specialist, and chronic headaches. She's had history of trigeminal neuralgia. She is followed by headache specialist and had recent deep brain stimulator placed which thus far has not helped her headaches.  Medications reviewed. Her major concern is that she has gained substantial weight over the past year. She does not exercise and is unable to do much walking because of her chronic knee and other joint problems. She has flu vaccine. No history of shingles vaccine.  She's had previous total hysterectomy. She still followed by a gynecologist. Gets yearly mammograms. Colonoscopy up to date.  Past Medical History  Diagnosis Date  . Unspecified vitamin D deficiency 03/31/2010  . ANXIETY 07/01/2008  . DEPRESSION 07/01/2008  . REFLEX SYMPATHETIC DYSTROPHY 07/01/2008  . Trigeminal neuralgia 05/21/2009  . Other specified trigeminal nerve disorders 07/01/2008  . ALLERGIC RHINITIS 07/01/2008  . BRONCHITIS, CHRONIC 07/01/2008  . GERD 07/01/2008  . ECZEMA 05/29/2009  . OSTEOARTHRITIS 07/01/2008  . OCCIPITAL NEURALGIA 07/01/2008  . BACK PAIN, THORACIC REGION 03/04/2010  . FIBROMYALGIA 07/01/2008  . LEG PAIN, BILATERAL 07/01/2008  . FACIAL PAIN 12/25/2009  . CARPAL TUNNEL SYNDROME, HX OF 07/01/2008  . DVT, HX OF 07/01/2008  . MIGRAINES, HX OF 07/01/2008  . CHRONIC OBSTRUCTIVE PULMONARY DISEASE, ACUTE EXACERBATION 08/07/2010   Past Surgical History  Procedure Laterality Date  . Lavh      BSO  . Tonsillectomy  1957  . Right foot bone spur  1987  . Left knee surgery  1989  . Right knee surgery  1990  . Right soulder surgery  1992, 1993, 1995, 1999  . Head  craniectomy  1994    MICROVASULAR DECOMPRESSION  . Right foot surgery  1998    BAD CUT  . Right jaw surgery  2002  . Right thumb sugery  2004  . Carpal tunnel release  2009    RIGHT    reports that she quit smoking about 16 months ago. Her smoking use included Cigarettes. She has a 60 pack-year smoking history. She has never used smokeless tobacco. She reports that she does not drink alcohol or use illicit drugs. family history includes Cancer in her father; Cancer (age of onset: 53) in her mother; Hypertension in her father; Ovarian cancer in her mother; Prostate cancer in her father. Allergies  Allergen Reactions  . Cefazolin     Ancef - in surgery, swelled up, turned blue, heart problems  . Baclofen     REACTION: confusion  . Carbamazepine     REACTION: confusion  . Ceclor [Cefaclor]     hives  . Fentanyl     REACTION: nausea and vomiting  . Gabapentin     REACTION: confusion  . Oxcarbazepine     REACTION: confusion   1.  Risk factors based on Past Medical , Social, and Family history reviewed and as above 2.  Limitations in physical activities very limited physically but stable on feet with no recent falls 3.  Depression/mood no active depression issues 4.  Hearing no major impairment 5.  ADLs independent in all 6.  Cognitive function (orientation to time and place, language, writing, speech,memory) memory intact. Language and  judgment intact 7.  Home Safety no issues 8.  Height, weight, and visual acuity. Weight gain as above. Vision stable. High stable 9.  Counseling discussed at length importance of weight loss and we discussed strategies. 10. Recommendation of preventive services. Flu vaccine given. Check on coverage for shingles vaccine 11. Labs based on risk factors TSH, CBC, basic metabolic panel, lipid panel, hepatic panel 12. Care Plan as above    Review of Systems  Constitutional: Positive for fatigue and unexpected weight change. Negative for fever, activity  change and appetite change.  HENT: Negative for ear pain, hearing loss, sore throat and trouble swallowing.   Eyes: Negative for visual disturbance.  Respiratory: Negative for cough and shortness of breath.   Cardiovascular: Negative for chest pain and palpitations.  Gastrointestinal: Negative for nausea, vomiting, abdominal pain, diarrhea, constipation and blood in stool.  Endocrine: Positive for cold intolerance. Negative for polydipsia and polyuria.  Genitourinary: Negative for dysuria and hematuria.  Musculoskeletal: Positive for back pain. Negative for arthralgias and myalgias.  Skin: Negative for rash.  Neurological: Positive for headaches. Negative for dizziness and syncope.  Hematological: Negative for adenopathy. Does not bruise/bleed easily.  Psychiatric/Behavioral: Negative for confusion and dysphoric mood.       Objective:   Physical Exam  Constitutional: She is oriented to person, place, and time. She appears well-developed and well-nourished.  HENT:  Right Ear: External ear normal.  Left Ear: External ear normal.  Mouth/Throat: Oropharynx is clear and moist.  Eyes: Pupils are equal, round, and reactive to light.  Neck: Neck supple. No thyromegaly present.  Cardiovascular: Normal rate and regular rhythm.  Exam reveals no gallop.   Pulmonary/Chest: Effort normal and breath sounds normal. No respiratory distress. She has no wheezes. She has no rales.  Abdominal: Soft. Bowel sounds are normal. She exhibits no distension and no mass. There is no tenderness. There is no rebound and no guarding.  Musculoskeletal: She exhibits no edema.  Lymphadenopathy:    She has no cervical adenopathy.  Neurological: She is alert and oriented to person, place, and time. No cranial nerve deficit.  Skin: No rash noted.  Psychiatric: She has a normal mood and affect. Her behavior is normal.          Assessment & Plan:  #1 complete physical. Flu vaccine given. Check on coverage for  shingles vaccine. Previous hysterectomy so no indications for Pap smear. She continues to see gynecologist for breast exams and mammogram. Colonoscopy up to date. #2 progressive weight gain and fatigue. Previous thyroid testing normal. We discussed at length strategies for weight loss. She has very limited physical activity but appears to be consuming very few calories. We have suggested tracking program such as "Myfitnesspal" #3 GERD which is currently stable

## 2013-06-15 ENCOUNTER — Other Ambulatory Visit: Payer: Self-pay | Admitting: *Deleted

## 2013-06-15 DIAGNOSIS — N644 Mastodynia: Secondary | ICD-10-CM

## 2013-06-18 ENCOUNTER — Other Ambulatory Visit: Payer: Self-pay

## 2013-06-18 DIAGNOSIS — N644 Mastodynia: Secondary | ICD-10-CM

## 2013-07-03 DIAGNOSIS — M542 Cervicalgia: Secondary | ICD-10-CM | POA: Diagnosis not present

## 2013-07-03 DIAGNOSIS — R51 Headache: Secondary | ICD-10-CM | POA: Diagnosis not present

## 2013-07-03 DIAGNOSIS — G894 Chronic pain syndrome: Secondary | ICD-10-CM | POA: Diagnosis not present

## 2013-07-03 DIAGNOSIS — M531 Cervicobrachial syndrome: Secondary | ICD-10-CM | POA: Diagnosis not present

## 2013-07-05 DIAGNOSIS — IMO0002 Reserved for concepts with insufficient information to code with codable children: Secondary | ICD-10-CM | POA: Diagnosis not present

## 2013-07-05 DIAGNOSIS — M531 Cervicobrachial syndrome: Secondary | ICD-10-CM | POA: Diagnosis not present

## 2013-07-05 DIAGNOSIS — IMO0001 Reserved for inherently not codable concepts without codable children: Secondary | ICD-10-CM | POA: Diagnosis not present

## 2013-07-05 DIAGNOSIS — M7090 Unspecified soft tissue disorder related to use, overuse and pressure of unspecified site: Secondary | ICD-10-CM | POA: Diagnosis not present

## 2013-07-06 ENCOUNTER — Ambulatory Visit (INDEPENDENT_AMBULATORY_CARE_PROVIDER_SITE_OTHER): Payer: Medicare Other | Admitting: Gynecology

## 2013-07-06 ENCOUNTER — Encounter: Payer: Self-pay | Admitting: Gynecology

## 2013-07-06 VITALS — BP 130/80 | Ht 63.0 in | Wt 229.0 lb

## 2013-07-06 DIAGNOSIS — N951 Menopausal and female climacteric states: Secondary | ICD-10-CM | POA: Diagnosis not present

## 2013-07-06 DIAGNOSIS — R82998 Other abnormal findings in urine: Secondary | ICD-10-CM | POA: Diagnosis not present

## 2013-07-06 DIAGNOSIS — N760 Acute vaginitis: Secondary | ICD-10-CM | POA: Diagnosis not present

## 2013-07-06 DIAGNOSIS — N952 Postmenopausal atrophic vaginitis: Secondary | ICD-10-CM | POA: Diagnosis not present

## 2013-07-06 MED ORDER — FLUCONAZOLE 150 MG PO TABS: 150.0000 mg | ORAL_TABLET | Freq: Once | ORAL | Status: DC

## 2013-07-06 NOTE — Progress Notes (Signed)
DYLYNN KETNER Dec 19, 1950 [id number]        62 y.o.  G0P0 for followup exam.  Several issues noted below.  Past medical history,surgical history, problem list, medications, allergies, family history and social history were all reviewed and documented in the EPIC chart.  ROS:  Performed and pertinent positives and negatives are included in the history, assessment and plan .  Exam: Kim assistant Filed Vitals:   07/06/13 1158  BP: 130/80  Height: 5\' 3"  (1.6 m)  Weight: 229 lb (103.874 kg)   General appearance  Normal Skin grossly normal Head/Neck normal with no cervical or supraclavicular adenopathy thyroid normal Lungs  clear Cardiac RR, without RMG Abdominal  soft, nontender, without masses, organomegaly or hernia Breasts  examined lying and sitting without masses, retractions, discharge or axillary adenopathy. Pelvic  Ext/BUS/vagina  Normal with atrophic changes   Adnexa  Without masses or tenderness    Anus and perineum  Normal   Rectovaginal  Normal sphincter tone without palpated masses or tenderness.    Assessment/Plan:  62 y.o. G0P0 female for annual exam.   1. Menopausal symptoms status post LAVH BSO. Had been on ERT but this was discontinued as she has a history of DVT. She is having significant hot flashes and night sweats. Trial of Paxil 10 mg last year unsuccessfully she stopped it. She did ask about a trial of Brisdelle. She knows it's the same medication but a little lower dose. She would like to try this I gave her 6 week sample she is going to call me at the end of this to see how she's doing. 2. Pap smear 2011. No Pap smear done today. No history of significant abnormal Pap smears previously. Reviewed current screening guidelines we both agree to stop screening as she is status post hysterectomy for benign indications. 3. Mammography 05/2013. Recently seen with left breast mastalgia with normal exam. They did not do a ultrasound as they told the patient the mammogram  was totally normal and did not feel ultrasound is warranted. Patient notes her discomfort is getting better. Exam today is normal we'll continue with self breast exams. As long as her discomfort resolves and she has normal exams and she'll follow expectantly. Continue with annual mammography. 4. DEXA 2011 normal. Repeat at 5 year interval. Increase calcium vitamin D reviewed. 5. Colonoscopy 2011. Repeat at their recommended interval. 6. Health maintenance. No routine blood work done this this is all done through her primary physician's office. Followup one year, sooner as needed.   Note: This document was prepared with digital dictation and possible smart phrase technology. Any transcriptional errors that result from this process are unintentional.   Dara Lords MD, 1:14 PM 07/06/2013

## 2013-07-06 NOTE — Patient Instructions (Signed)
Call me in followup as to your response to the Brisdelle. Followup in 1 year for annual exam.

## 2013-07-07 LAB — URINALYSIS W MICROSCOPIC + REFLEX CULTURE
Bacteria, UA: NONE SEEN
Casts: NONE SEEN
Glucose, UA: NEGATIVE mg/dL
Hgb urine dipstick: NEGATIVE
Ketones, ur: NEGATIVE mg/dL
Squamous Epithelial / LPF: NONE SEEN
Urobilinogen, UA: 0.2 mg/dL (ref 0.0–1.0)
pH: 6 (ref 5.0–8.0)

## 2013-07-08 LAB — URINE CULTURE: Colony Count: 2000

## 2013-07-11 ENCOUNTER — Encounter: Payer: Self-pay | Admitting: Family Medicine

## 2013-07-12 ENCOUNTER — Encounter: Payer: Self-pay | Admitting: Neurology

## 2013-07-16 ENCOUNTER — Encounter: Payer: Self-pay | Admitting: Neurology

## 2013-07-16 ENCOUNTER — Encounter (INDEPENDENT_AMBULATORY_CARE_PROVIDER_SITE_OTHER): Payer: Self-pay

## 2013-07-16 ENCOUNTER — Ambulatory Visit (INDEPENDENT_AMBULATORY_CARE_PROVIDER_SITE_OTHER): Payer: Medicare Other | Admitting: Neurology

## 2013-07-16 VITALS — BP 141/90 | HR 84 | Ht 63.0 in | Wt 228.0 lb

## 2013-07-16 DIAGNOSIS — R200 Anesthesia of skin: Secondary | ICD-10-CM

## 2013-07-16 DIAGNOSIS — Z87898 Personal history of other specified conditions: Secondary | ICD-10-CM

## 2013-07-16 DIAGNOSIS — R209 Unspecified disturbances of skin sensation: Secondary | ICD-10-CM | POA: Diagnosis not present

## 2013-07-16 NOTE — Progress Notes (Signed)
Reason for visit: Right hand numbness  Ashley Castaneda is a 62 y.o. female  History of present illness:  Ashley Castaneda is a 62 year old white female with a history of chronic right-sided headaches. The patient has been followed for headache management, and an occipital nerve stimulator was placed on the right on 04/06/2013. Within a month after the stimulator was placed on the patient began to have some tingling sensations and numbness into the thumb, index finger, and middle finger of her right hand. The patient has a prior history of carpal tunnel syndrome, and she has had surgery in the past. The patient denies any new neck discomfort, but she does have chronic neck pain. The patient has spasms in the neck and shoulders, she may have some tingling into the upper arm and shoulder. The patient denies any pain radiating down from the neck to the hand currently. The patient denies any relationship of the right hand symptoms to turning the head and neck. The patient denies any significant weakness of the extremities. The patient has chronic numbness and tingly sensations in both legs up to the hip level. The patient has some problems with bladder spasms, and she is on medication for this. The patient denies any significant balance problems, but she does have some slight imbalance, but no falls. The patient is sent to this office for further evaluation of the new right arm symptoms.  Past Medical History  Diagnosis Date  . Unspecified vitamin D deficiency 03/31/2010  . ANXIETY 07/01/2008  . DEPRESSION 07/01/2008  . REFLEX SYMPATHETIC DYSTROPHY 07/01/2008  . Trigeminal neuralgia 05/21/2009  . Other specified trigeminal nerve disorders 07/01/2008  . ALLERGIC RHINITIS 07/01/2008  . BRONCHITIS, CHRONIC 07/01/2008  . GERD 07/01/2008  . ECZEMA 05/29/2009  . OSTEOARTHRITIS 07/01/2008  . OCCIPITAL NEURALGIA 07/01/2008  . BACK PAIN, THORACIC REGION 03/04/2010  . FIBROMYALGIA 07/01/2008  . LEG PAIN, BILATERAL  07/01/2008  . FACIAL PAIN 12/25/2009  . CARPAL TUNNEL SYNDROME, HX OF 07/01/2008  . DVT, HX OF 07/01/2008  . MIGRAINES, HX OF 07/01/2008  . CHRONIC OBSTRUCTIVE PULMONARY DISEASE, ACUTE EXACERBATION 08/07/2010    Past Surgical History  Procedure Laterality Date  . [place]      BSO  . Tonsillectomy  1957  . Right foot bone spur  1987  . Left knee surgery  1989  . Right knee surgery  1990  . Right [place]oulder surgery  1992, 1993, 1995, 1999  . Head craniectomy  1994    MICROVASULAR DECOMPRESSION  . Right foot surgery  1998    BAD CUT  . Right jaw surgery  2002  . Right thumb sugery  2004  . Carpal tunnel release  2009    RIGHT  . Occipital nerve stimulator insertion      Family History  Problem Relation Age of Onset  . Ovarian cancer Mother 81  . Hypertension Father   . Prostate cancer Father   . Heart attack Maternal Grandmother   . Stroke Paternal Grandmother     Social history:  reports that she quit smoking about 17 months ago. Her smoking use included Cigarettes. She has a 60 pack-year smoking history. She has never used smokeless tobacco. She reports that she does not drink alcohol or use illicit drugs.  Medications:  Current Outpatient Prescriptions on File Prior to Visit  Medication Sig Dispense Refill  . ALPRAZolam (XANAX) 0.5 MG tablet One po tid prn  270 tablet  1  . calcium carbonate (OS-CAL - DOSED  IN MG OF ELEMENTAL CALCIUM) 1250 MG tablet Take 1 tablet by mouth daily.      . cetirizine (ZYRTEC) 10 MG tablet Take 10 mg by mouth daily.        . Cinnamon 500 MG capsule Take 1,000 mg by mouth daily.       . cycloSPORINE (RESTASIS) 0.05 % ophthalmic emulsion Place 1 drop into both eyes 2 (two) times daily.      . fluconazole (DIFLUCAN) 150 MG tablet Take 1 tablet (150 mg total) by mouth once.  10 tablet  2  . frovatriptan (FROVA) 2.5 MG tablet Take 2.5 mg by mouth as needed. If recurs, may repeat after 2 hours. Max of 3 tabs in 24 hours. For migraines.      . furosemide  (LASIX) 20 MG tablet Take 1 tablet (20 mg total) by mouth daily. For swelling  90 tablet  3  . guaiFENesin (MUCINEX) 600 MG 12 hr tablet Take 1,200 mg by mouth 2 (two) times daily.      . hydrOXYzine (ATARAX) 10 MG tablet Take 10 mg by mouth every 8 (eight) hours as needed. For dizziness per Berneice Gandy, MD      . lidocaine (LIDODERM) 5 % Place 1 patch onto the skin daily. Remove & Discard patch within 12 hours or as directed by Berneice Gandy MD      . metaxalone (SKELAXIN) 800 MG tablet Take 800 mg by mouth 3 (three) times daily.        . metoclopramide (REGLAN) 10 MG tablet Take 10 mg by mouth 3 (three) times daily as needed. For nausea      . montelukast (SINGULAIR) 10 MG tablet Take 1 tablet (10 mg total) by mouth daily at 6 PM.  90 tablet  3  . Multiple Vitamin (MULTIVITAMIN) tablet Take 1 tablet by mouth every morning.       . Omega-3 Fatty Acids (FISH OIL) 1200 MG CAPS Take 1 capsule by mouth every evening.      . potassium chloride (K-DUR) 10 MEQ tablet Take 1 tablet (10 mEq total) by mouth daily as needed.  60 tablet  3  . RABEprazole (ACIPHEX) 20 MG tablet Take 1 tablet (20 mg total) by mouth every morning.  90 tablet  3  . silodosin (RAPAFLO) 8 MG CAPS capsule Take 1 capsule (8 mg total) by mouth daily with breakfast.  90 capsule  3  . tiZANidine (ZANAFLEX) 2 MG tablet Take 2 mg by mouth at bedtime.      . vitamin E 400 UNIT capsule Take 400 Units by mouth daily at 6 PM.       . fluticasone (FLONASE) 50 MCG/ACT nasal spray Place 2 sprays into the nose daily as needed. For nasal congestion  16 g  3   No current facility-administered medications on file prior to visit.      Allergies  Allergen Reactions  . Cefazolin     Ancef - in surgery, swelled up, turned blue, heart problems  . Baclofen     REACTION: confusion  . Carbamazepine     REACTION: confusion  . Ceclor [Cefaclor]     hives  . Fentanyl     REACTION: nausea and vomiting  . Gabapentin     REACTION: confusion  .  Oxcarbazepine     REACTION: confusion    ROS:  Out of a complete 14 system review of symptoms, the patient complains only of the following symptoms, and all other reviewed systems  are negative.  Weight gain Swelling in the legs Hearing loss, ringing in the ears, spinning sensations, difficulty swallowing Loss of vision, eye pain Constipation Easy bruising Joint pain, joint swelling, muscle cramps, achy muscles Allergies Memory loss, confusion, headache, numbness, weakness, difficulty swallowing, dizziness  Sleepiness  Blood pressure 141/90, pulse 84, height 5\' 3"  (1.6 m), weight 228 lb (103.42 kg), last menstrual period 07/26/1992.  Physical Exam  General: The patient is alert and cooperative at the time of the examination. The patient is moderately obese.  Head: Pupils are equal, round, and reactive to light. Discs are flat bilaterally.  Neck: The neck is supple, no carotid bruits are noted.  Respiratory: The respiratory examination is clear.  Cardiovascular: The cardiovascular examination reveals a regular rate and rhythm, no obvious murmurs or rubs are noted.  Neuromuscular: The patient lacks 15-20 of full lateral rotation of the cervical spine bilaterally.  Skin: Extremities are with 1-2+ edema below the knees bilaterally.  Neurologic Exam  Mental status:  Cranial nerves: Facial symmetry is present. There is good sensation of the face to pinprick and soft touch bilaterally on the forehead, decreased on the right on the lower face. The strength of the facial muscles and the muscles to head turning and shoulder shrug are normal bilaterally. Speech is well enunciated, no aphasia or dysarthria is noted. Extraocular movements are full. Visual fields are full.  Motor: The motor testing reveals 5 over 5 strength of all 4 extremities. There is no weakness of the APB muscle on either side. Good symmetric motor tone is noted throughout.  Sensory: Sensory testing is intact to  pinprick, soft touch, vibration sensation, and position sense on all 4 extremities. No evidence of extinction is noted.  Coordination: Cerebellar testing reveals good finger-nose-finger and heel-to-shin bilaterally. Tinel sign of the wrists is positive on the right, negative on the left.  Gait and station: Gait is normal. Tandem gait is normal. Romberg is negative. No drift is seen.  Reflexes: Deep tendon reflexes are symmetric, but is depressed bilaterally. Toes are downgoing bilaterally.   Assessment/Plan:  1. Chronic daily right sided headache  2. Right hand dysesthesias  The patient has a prior history of carpal tunnel syndrome surgery on the right. The patient may have some recurrence of symptoms. Clinical examination does not show weakness of the right arm. The patient will be set up for nerve conduction studies of both arms, EMG evaluation of the right arm. MRI evaluation of the cervical spine will be done if needed.  Marlan Palau MD 07/16/2013 7:42 PM  [facility] [address] [place], [place] [address]  Phone [phone] Fax [phone]

## 2013-08-01 ENCOUNTER — Encounter (INDEPENDENT_AMBULATORY_CARE_PROVIDER_SITE_OTHER): Payer: Self-pay

## 2013-08-01 ENCOUNTER — Ambulatory Visit (INDEPENDENT_AMBULATORY_CARE_PROVIDER_SITE_OTHER): Payer: Medicare Other | Admitting: Neurology

## 2013-08-01 DIAGNOSIS — R209 Unspecified disturbances of skin sensation: Secondary | ICD-10-CM | POA: Diagnosis not present

## 2013-08-01 DIAGNOSIS — R202 Paresthesia of skin: Secondary | ICD-10-CM

## 2013-08-01 DIAGNOSIS — Z87898 Personal history of other specified conditions: Secondary | ICD-10-CM

## 2013-08-01 DIAGNOSIS — R200 Anesthesia of skin: Secondary | ICD-10-CM

## 2013-08-01 DIAGNOSIS — Z0289 Encounter for other administrative examinations: Secondary | ICD-10-CM

## 2013-08-01 NOTE — Progress Notes (Signed)
Ashley Castaneda is a 63 year old patient with a history of right-sided headaches. The patient had an occipital nerve stimulator inserted on the right side in September 2014. Within a month after surgery, the patient began developing a gradual onset of numbness involving the right thumb, index finger, and middle finger. The patient denies pain or numbness radiating down the arm from the neck. The patient does have chronic neck discomfort. The patient comes in today for EMG and nerve conduction study.  Nerve conduction studies of both upper extremity were unremarkable, without evidence of carpal tunnel syndrome. EMG evaluation of the right upper extremity is essentially normal, but some denervation of the lower cervical paraspinal muscles are seen on the right. The clinical significance of this is not clear, as there are surgical changes in this region that may impact the integrity of the paraspinal muscles.  The patient will be sent for MRI evaluation of the cervical spine, as EMG nerve conduction study did not clearly delineate the etiology of the hand numbness.  The patient will followup through this office if needed. I will contact the patient once the results of the cervical spine MRI are available.

## 2013-08-01 NOTE — Procedures (Signed)
     HISTORY:  Ashley Castaneda is a 63 year old patient with a history right sided headaches. The patient had an occipital nerve stimulator placed in September 2014. One month later, the patient has had a gradual onset of numbness affecting the right hand with the middle finger, index finger, and thumb. The patient has had a prior history of carpal tunnel syndrome in the past. The patient does have chronic neck pain, without radiation down into the right arm. The patient is being evaluated for possible neuropathy or a cervical radiculopathy.  NERVE CONDUCTION STUDIES:  Nerve conduction studies were performed on both upper extremities. The distal motor latencies and motor amplitudes for the median and ulnar nerves were within normal limits. The F wave latencies and nerve conduction velocities for these nerves were also normal. The sensory latencies for the median and ulnar nerves were normal.   EMG STUDIES:  EMG study was performed on the right upper extremity:  The first dorsal interosseous muscle reveals 2 to 4 K units with full recruitment. No fibrillations or positive waves were noted. The abductor pollicis brevis muscle reveals 2 to 4 K units with full recruitment. No fibrillations or positive waves were noted. The extensor indicis proprius muscle reveals 1 to 3 K units with full recruitment. No fibrillations or positive waves were noted. The pronator teres muscle reveals 2 to 3 K units with full recruitment. No fibrillations or positive waves were noted. The biceps muscle reveals 1 to 2 K units with full recruitment. No fibrillations or positive waves were noted. The triceps muscle reveals 2 to 4 K units with full recruitment. No fibrillations or positive waves were noted. The anterior deltoid muscle reveals 2 to 3 K units with full recruitment. No fibrillations or positive waves were noted. The cervical paraspinal muscles were tested at 2 levels. No abnormalities of insertional activity  were seen at the upper level tested. 2+ fibrillations and positive waves were seen at the lower level. There was good relaxation.   IMPRESSION:  Nerve conduction studies done on both upper extremities were within normal limits. No evidence of a neuropathy affecting the right arm is noted. EMG evaluation of the right arm is essentially normal, but some denervation was seen in the cervical paraspinal muscles on the right. Clinical significance of this is not clear, as the patient has had surgical procedures around this area that may have altered the integrity of the paraspinal muscles. Clinical correlation is required.  Jill Alexanders MD 08/01/2013 1:57 PM  [facility] [address] [place], [place] [address]  Phone [phone] Fax [phone]

## 2013-08-17 DIAGNOSIS — G90529 Complex regional pain syndrome I of unspecified lower limb: Secondary | ICD-10-CM | POA: Diagnosis not present

## 2013-08-17 DIAGNOSIS — IMO0002 Reserved for concepts with insufficient information to code with codable children: Secondary | ICD-10-CM | POA: Diagnosis not present

## 2013-08-17 DIAGNOSIS — IMO0001 Reserved for inherently not codable concepts without codable children: Secondary | ICD-10-CM | POA: Diagnosis not present

## 2013-08-17 DIAGNOSIS — G43719 Chronic migraine without aura, intractable, without status migrainosus: Secondary | ICD-10-CM | POA: Diagnosis not present

## 2013-08-24 ENCOUNTER — Telehealth: Payer: Self-pay | Admitting: Neurology

## 2013-08-24 DIAGNOSIS — M5481 Occipital neuralgia: Secondary | ICD-10-CM

## 2013-08-24 NOTE — Telephone Encounter (Signed)
Radiologist is determined that the occipital nerve stimulator is not MRI compatible for some reason. I'm not sure this make sense to me, as the day goes nerve stimulators are fully compatible, and the stimulator is the same that they are using with this patient. The patient will be set up for CT scan evaluation of the cervical spine.

## 2013-08-29 ENCOUNTER — Ambulatory Visit
Admission: RE | Admit: 2013-08-29 | Discharge: 2013-08-29 | Disposition: A | Discharge status: Home / Self Care | Payer: Medicare Other | Source: Ambulatory Visit | Attending: Neurology | Admitting: Neurology

## 2013-08-29 ENCOUNTER — Telehealth: Payer: Self-pay | Admitting: Neurology

## 2013-08-29 DIAGNOSIS — M47812 Spondylosis without myelopathy or radiculopathy, cervical region: Secondary | ICD-10-CM | POA: Diagnosis not present

## 2013-08-29 DIAGNOSIS — M5020 Other cervical disc displacement, unspecified cervical region: Secondary | ICD-10-CM | POA: Diagnosis not present

## 2013-08-29 DIAGNOSIS — M5481 Occipital neuralgia: Secondary | ICD-10-CM

## 2013-08-29 NOTE — Telephone Encounter (Signed)
I called the patient. The CT of the cervical spine does not show any NR compression. The patient will be getting a second opinion concerning her occipital nerve stimulator. I am not clear whether or not this is the etiology of her right arm and hand tingling. The onset of her symptoms came on after the stimulator was placed. The patient is unable to tolerate carbamazepine, Trileptal, gabapentin, Lyrica, and Cymbalta.   CT cervical 08/30/13:  IMPRESSION:  Spondylosis at C6-7 with endplate osteophytes and bulging of the  disc more prominent towards the left. This would not be expected to  result in critical canal stenosis. There is only mild osteophytic  encroachment upon the foramina.  Non-compressive disc bulges at C3-4, C4-5 and C5-6.

## 2013-09-10 DIAGNOSIS — G500 Trigeminal neuralgia: Secondary | ICD-10-CM | POA: Diagnosis not present

## 2013-09-10 DIAGNOSIS — H04129 Dry eye syndrome of unspecified lacrimal gland: Secondary | ICD-10-CM | POA: Diagnosis not present

## 2013-09-10 DIAGNOSIS — H11009 Unspecified pterygium of unspecified eye: Secondary | ICD-10-CM | POA: Diagnosis not present

## 2013-09-10 DIAGNOSIS — H2510 Age-related nuclear cataract, unspecified eye: Secondary | ICD-10-CM | POA: Diagnosis not present

## 2013-09-10 DIAGNOSIS — H01009 Unspecified blepharitis unspecified eye, unspecified eyelid: Secondary | ICD-10-CM | POA: Diagnosis not present

## 2013-09-10 DIAGNOSIS — H0289 Other specified disorders of eyelid: Secondary | ICD-10-CM | POA: Diagnosis not present

## 2013-09-25 DIAGNOSIS — IMO0002 Reserved for concepts with insufficient information to code with codable children: Secondary | ICD-10-CM | POA: Diagnosis not present

## 2013-09-25 DIAGNOSIS — IMO0001 Reserved for inherently not codable concepts without codable children: Secondary | ICD-10-CM | POA: Diagnosis not present

## 2013-09-25 DIAGNOSIS — G43119 Migraine with aura, intractable, without status migrainosus: Secondary | ICD-10-CM | POA: Diagnosis not present

## 2013-09-25 DIAGNOSIS — G43719 Chronic migraine without aura, intractable, without status migrainosus: Secondary | ICD-10-CM | POA: Diagnosis not present

## 2013-10-05 ENCOUNTER — Encounter: Payer: Self-pay | Admitting: Cardiovascular Disease

## 2013-10-05 ENCOUNTER — Ambulatory Visit (INDEPENDENT_AMBULATORY_CARE_PROVIDER_SITE_OTHER): Payer: Medicare Other | Admitting: Cardiovascular Disease

## 2013-10-05 VITALS — BP 118/60 | HR 76 | Ht 63.0 in | Wt 228.0 lb

## 2013-10-05 DIAGNOSIS — I824Y9 Acute embolism and thrombosis of unspecified deep veins of unspecified proximal lower extremity: Secondary | ICD-10-CM | POA: Diagnosis not present

## 2013-10-05 DIAGNOSIS — K219 Gastro-esophageal reflux disease without esophagitis: Secondary | ICD-10-CM

## 2013-10-05 DIAGNOSIS — I82419 Acute embolism and thrombosis of unspecified femoral vein: Secondary | ICD-10-CM

## 2013-10-05 NOTE — Patient Instructions (Signed)
Your physician wants you to follow-up in: 1 year with Dr Berry. You will receive a reminder letter in the mail two months in advance. If you don't receive a letter, please call our office to schedule the follow-up appointment.  

## 2013-10-05 NOTE — Assessment & Plan Note (Signed)
History of remote right lower extremity DVT for which she was on Coumadin for a short period time and then chronic aspirin. Her most recent Dopplers performed in June of last year showed this small amount of residual thrombus in her right distal popliteal vein anterior tibial veins. She does complain of pain in her leg which is somewhat atypical. It pain with contact when she's sitting down. There is no asymmetry or swelling. I doubt if it is related to her mostly resolved remote DVT

## 2013-10-05 NOTE — Progress Notes (Signed)
10/05/2013 Ashley Castaneda   1950-08-26  [id number]  Primary Physician Ashley Post, MD Primary Cardiologist: Ashley Harp MD [name]   HPI:  The patient is a pleasant 63 -year-old female who has been seen by Dr. Gwenlyn Castaneda in the past for a DVT. She is off Coumadin now. She has multiple other issues including chronic pain. She is followed at a pain clinic in [place]. She reportedly may receive an implantable TENS unit. I saw her in the office October 10, 2012. She had been having some dyspnea on exertion and some epigastric symptoms. She does have some risk factors for coronary disease and we went ahead and did a [employer] and she is now here for followup. Fortunately, her Lexiscan Myoview was negative for ischemia and she has normal LV function. She continues to have some dyspnea on exertion. This is unchanged. She has residual atypical pain in her right lower extremity. Doppler is performed in June of last year showed residual mild organized thrombus in her distal right popliteal vein and peritoneal veins which I doubt related to her symptoms.    Current Outpatient Prescriptions  Medication Sig Dispense Refill  . ALPRAZolam (XANAX) 0.5 MG tablet One po tid prn  270 tablet  1  . calcium carbonate (OS-CAL - DOSED IN MG OF ELEMENTAL CALCIUM) 1250 MG tablet Take 1 tablet by mouth daily.      . cetirizine (ZYRTEC) 10 MG tablet Take 10 mg by mouth daily.        . Cinnamon 500 MG capsule Take 1,000 mg by mouth daily.       . cycloSPORINE (RESTASIS) 0.05 % ophthalmic emulsion Place 1 drop into both eyes 2 (two) times daily.      . fluconazole (DIFLUCAN) 150 MG tablet Take 1 tablet (150 mg total) by mouth once.  10 tablet  2  . frovatriptan (FROVA) 2.5 MG tablet Take 2.5 mg by mouth as needed. If recurs, may repeat after 2 hours. Max of 3 tabs in 24 hours. For migraines.      . furosemide (LASIX) 20 MG tablet Take 1 tablet (20 mg total) by mouth daily. For swelling   90 tablet  3  . guaiFENesin (MUCINEX) 600 MG 12 hr tablet Take 1,200 mg by mouth 2 (two) times daily.      . hydrOXYzine (ATARAX) 10 MG tablet Take 10 mg by mouth every 8 (eight) hours as needed. For dizziness per Ashley Reel, MD      . lidocaine (LIDODERM) 5 % Place 1 patch onto the skin daily. Remove & Discard patch within 12 hours or as directed by Ashley Reel MD      . metaxalone (SKELAXIN) 800 MG tablet Take 800 mg by mouth 3 (three) times daily.        . metoclopramide (REGLAN) 10 MG tablet Take 10 mg by mouth 3 (three) times daily as needed. For nausea      . montelukast (SINGULAIR) 10 MG tablet Take 1 tablet (10 mg total) by mouth daily at 6 PM.  90 tablet  3  . Multiple Vitamin (MULTIVITAMIN) tablet Take 1 tablet by mouth every morning.       . Omega-3 Fatty Acids (FISH OIL) 1200 MG CAPS Take 1 capsule by mouth every evening.      . potassium chloride (K-DUR) 10 MEQ tablet Take 1 tablet (10 mEq total) by mouth daily as needed.  60 tablet  3  . RABEprazole (ACIPHEX) 20 MG  tablet Take 1 tablet (20 mg total) by mouth every morning.  90 tablet  3  . silodosin (RAPAFLO) 8 MG CAPS capsule Take 1 capsule (8 mg total) by mouth daily with breakfast.  90 capsule  3  . tiZANidine (ZANAFLEX) 2 MG tablet Take 2 mg by mouth at bedtime.      . vitamin E 400 UNIT capsule Take 400 Units by mouth daily at 6 PM.       . fluticasone (FLONASE) 50 MCG/ACT nasal spray Place 2 sprays into the nose daily as needed. For nasal congestion  16 g  3   No current facility-administered medications for this visit.    Allergies  Allergen Reactions  . Cefazolin     Ancef - in surgery, swelled up, turned blue, heart problems  . Baclofen     REACTION: confusion  . Carbamazepine     REACTION: confusion  . Ceclor [Cefaclor]     hives  . Fentanyl     REACTION: nausea and vomiting  . Gabapentin     REACTION: confusion  . Oxcarbazepine     REACTION: confusion    History   Social History  . Marital Status:  Married    Spouse Name: N/A    Number of Children: 0  . Years of Education: college-3   Occupational History  . disabled    Social History Main Topics  . Smoking status: Former Smoker -- 2.00 packs/day for 30 years    Types: Cigarettes    Quit date: 02/04/2012  . Smokeless tobacco: Never Used  . Alcohol Use: No  . Drug Use: No  . Sexual Activity: No   Other Topics Concern  . Not on file   Social History Narrative  . No narrative on file     Review of Systems: General: negative for chills, fever, night sweats or weight changes.  Cardiovascular: negative for chest pain, dyspnea on exertion, edema, orthopnea, palpitations, paroxysmal nocturnal dyspnea or shortness of breath Dermatological: negative for rash Respiratory: negative for cough or wheezing Urologic: negative for hematuria Abdominal: negative for nausea, vomiting, diarrhea, bright red blood per rectum, melena, or hematemesis Neurologic: negative for visual changes, syncope, or dizziness All other systems reviewed and are otherwise negative except as noted above.    Blood pressure 118/60, pulse 76, height 5\' 3"  (1.6 m), weight 228 lb (103.42 kg), last menstrual period 07/26/1992.  General appearance: alert and no distress Neck: no adenopathy, no carotid bruit, no JVD, supple, symmetrical, trachea midline and thyroid not enlarged, symmetric, no tenderness/mass/nodules Lungs: clear to auscultation bilaterally Heart: regular rate and rhythm, S1, S2 normal, no murmur, click, rub or gallop Extremities: extremities normal, atraumatic, no cyanosis or edema and 2+ pedal pulses bilaterally  EKG normal sinus rhythm at 76 without ST or T wave changes  ASSESSMENT AND PLAN:   Dvt femoral (deep venous thrombosis) History of remote right lower extremity DVT for which she was on Coumadin for a short period time and then chronic aspirin. Her most recent Dopplers performed in June of last year showed this small amount of residual  thrombus in her right distal popliteal vein anterior tibial veins. She does complain of pain in her leg which is somewhat atypical. It pain with contact when she's sitting down. There is no asymmetry or swelling. I doubt if it is related to her mostly resolved remote DVT      Ashley Harp MD [facility] 10/05/2013 12:55 PM

## 2013-10-19 ENCOUNTER — Other Ambulatory Visit: Payer: Self-pay

## 2013-10-19 ENCOUNTER — Telehealth: Payer: Self-pay

## 2013-10-19 MED ORDER — SILODOSIN 8 MG PO CAPS: 8.0000 mg | ORAL_CAPSULE | Freq: Every day | ORAL | Status: DC

## 2013-10-19 NOTE — Telephone Encounter (Signed)
Xanax  Last refill 05-15-13 #270 1 refill Last visit 06/14/13

## 2013-10-19 NOTE — Telephone Encounter (Signed)
Pt req rx on ALPRAZolam (XANAX) 0.5 MG tablet   And also silodosin    Pharmacy; meds by mail [facility] [place] fax number [phone]

## 2013-10-21 NOTE — Telephone Encounter (Signed)
May refill Silodosin for one year and Xanax for 6 months.

## 2013-10-22 MED ORDER — ALPRAZOLAM 0.5 MG PO TABS: 0.5000 mg | ORAL_TABLET | Freq: Three times a day (TID) | ORAL | Status: DC | PRN

## 2013-10-22 NOTE — Telephone Encounter (Signed)
Faxed RX to pharmacy.  

## 2013-10-25 DIAGNOSIS — H526 Other disorders of refraction: Secondary | ICD-10-CM | POA: Diagnosis not present

## 2013-10-25 DIAGNOSIS — H4300 Vitreous prolapse, unspecified eye: Secondary | ICD-10-CM | POA: Diagnosis not present

## 2013-10-25 DIAGNOSIS — H3530 Unspecified macular degeneration: Secondary | ICD-10-CM | POA: Diagnosis not present

## 2013-10-25 DIAGNOSIS — H2510 Age-related nuclear cataract, unspecified eye: Secondary | ICD-10-CM | POA: Diagnosis not present

## 2013-10-25 DIAGNOSIS — H11009 Unspecified pterygium of unspecified eye: Secondary | ICD-10-CM | POA: Diagnosis not present

## 2013-10-25 DIAGNOSIS — G43709 Chronic migraine without aura, not intractable, without status migrainosus: Secondary | ICD-10-CM | POA: Diagnosis not present

## 2013-10-25 DIAGNOSIS — H43819 Vitreous degeneration, unspecified eye: Secondary | ICD-10-CM | POA: Diagnosis not present

## 2013-10-25 DIAGNOSIS — IMO0001 Reserved for inherently not codable concepts without codable children: Secondary | ICD-10-CM | POA: Diagnosis not present

## 2013-10-25 DIAGNOSIS — H01009 Unspecified blepharitis unspecified eye, unspecified eyelid: Secondary | ICD-10-CM | POA: Diagnosis not present

## 2013-10-25 DIAGNOSIS — H04129 Dry eye syndrome of unspecified lacrimal gland: Secondary | ICD-10-CM | POA: Diagnosis not present

## 2013-10-25 DIAGNOSIS — M531 Cervicobrachial syndrome: Secondary | ICD-10-CM | POA: Diagnosis not present

## 2013-10-25 DIAGNOSIS — H2181 Floppy iris syndrome: Secondary | ICD-10-CM | POA: Diagnosis not present

## 2013-10-25 DIAGNOSIS — G500 Trigeminal neuralgia: Secondary | ICD-10-CM | POA: Diagnosis not present

## 2013-10-25 DIAGNOSIS — H0289 Other specified disorders of eyelid: Secondary | ICD-10-CM | POA: Diagnosis not present

## 2013-11-09 DIAGNOSIS — R51 Headache: Secondary | ICD-10-CM | POA: Diagnosis not present

## 2013-11-09 DIAGNOSIS — G894 Chronic pain syndrome: Secondary | ICD-10-CM | POA: Diagnosis not present

## 2013-11-09 DIAGNOSIS — Z9889 Other specified postprocedural states: Secondary | ICD-10-CM | POA: Diagnosis not present

## 2013-11-23 ENCOUNTER — Ambulatory Visit (INDEPENDENT_AMBULATORY_CARE_PROVIDER_SITE_OTHER): Payer: Medicare Other | Admitting: Family Medicine

## 2013-11-23 ENCOUNTER — Ambulatory Visit (HOSPITAL_COMMUNITY): Payer: Medicare Other | Attending: Internal Medicine | Admitting: Cardiology

## 2013-11-23 ENCOUNTER — Telehealth: Payer: Self-pay | Admitting: Family Medicine

## 2013-11-23 VITALS — BP 130/72 | HR 92 | Wt 233.0 lb

## 2013-11-23 DIAGNOSIS — M79609 Pain in unspecified limb: Secondary | ICD-10-CM

## 2013-11-23 DIAGNOSIS — R599 Enlarged lymph nodes, unspecified: Secondary | ICD-10-CM | POA: Insufficient documentation

## 2013-11-23 DIAGNOSIS — M79604 Pain in right leg: Secondary | ICD-10-CM

## 2013-11-23 DIAGNOSIS — Z86718 Personal history of other venous thrombosis and embolism: Secondary | ICD-10-CM | POA: Diagnosis not present

## 2013-11-23 DIAGNOSIS — Z7982 Long term (current) use of aspirin: Secondary | ICD-10-CM | POA: Insufficient documentation

## 2013-11-23 DIAGNOSIS — M7989 Other specified soft tissue disorders: Secondary | ICD-10-CM | POA: Insufficient documentation

## 2013-11-23 NOTE — Progress Notes (Signed)
Subjective:    Patient ID: Ashley Castaneda, female    DOB: [birth year], 63 y.o.   MRN: [id number]  Leg Pain    Patient seen with right leg pain. She has chronic right leg pain had DVT couple years ago. Most recent Dopplers in June of 2014 with evidence for persistent chronic DVT posterior tibial vein. She relates 3 days of progressive pain and possibly some increasing edema as well. She denies any recent injury. No prolonged travels. She's not describing any radiculopathy pains. No dyspnea. No pleuritic pain.  Past Medical History  Diagnosis Date  . Unspecified vitamin D deficiency 03/31/2010  . ANXIETY 07/01/2008  . DEPRESSION 07/01/2008  . REFLEX SYMPATHETIC DYSTROPHY 07/01/2008  . Trigeminal neuralgia 05/21/2009  . Other specified trigeminal nerve disorders 07/01/2008  . ALLERGIC RHINITIS 07/01/2008  . BRONCHITIS, CHRONIC 07/01/2008  . GERD 07/01/2008  . ECZEMA 05/29/2009  . OSTEOARTHRITIS 07/01/2008  . OCCIPITAL NEURALGIA 07/01/2008  . BACK PAIN, THORACIC REGION 03/04/2010  . FIBROMYALGIA 07/01/2008  . LEG PAIN, BILATERAL 07/01/2008  . FACIAL PAIN 12/25/2009  . CARPAL TUNNEL SYNDROME, HX OF 07/01/2008  . DVT, HX OF 07/01/2008  . MIGRAINES, HX OF 07/01/2008  . CHRONIC OBSTRUCTIVE PULMONARY DISEASE, ACUTE EXACERBATION 08/07/2010  . Hyperlipidemia    Past Surgical History  Procedure Laterality Date  . Lavh      BSO  . Tonsillectomy  1957  . Right foot bone spur  1987  . Left knee surgery  1989  . Right knee surgery  1990  . Right soulder surgery  [place]  . Head craniectomy  1994    MICROVASULAR DECOMPRESSION  . Right foot surgery  1998    BAD CUT  . Right jaw surgery  2002  . Right thumb sugery  2004  . Carpal tunnel release  2009    RIGHT  . Occipital nerve stimulator insertion      reports that she quit smoking about 21 months ago. Her smoking use included Cigarettes. She has a 60 pack-year smoking history. She has never used smokeless tobacco. She reports that she  does not drink alcohol or use illicit drugs. family history includes Heart attack in her maternal grandmother; Hypertension in her father; Ovarian cancer (age of onset: 53) in her mother; Prostate cancer in her father; Stroke in her paternal grandmother. Allergies  Allergen Reactions  . Cefazolin     Ancef - in surgery, swelled up, turned blue, heart problems  . Baclofen     REACTION: confusion  . Carbamazepine     REACTION: confusion  . Ceclor [Cefaclor]     hives  . Fentanyl     REACTION: nausea and vomiting  . Gabapentin     REACTION: confusion  . Oxcarbazepine     REACTION: confusion      Review of Systems  Constitutional: Negative for fever and chills.  Eyes: Negative for visual disturbance.  Respiratory: Negative for cough and shortness of breath.   Cardiovascular: Positive for leg swelling. Negative for chest pain and palpitations.  Gastrointestinal: Negative for abdominal pain.  Neurological: Negative for dizziness and weakness.  Psychiatric/Behavioral: Negative for confusion.       Objective:   Physical Exam  Constitutional: She is oriented to person, place, and time. She appears well-developed and well-nourished.  Cardiovascular: Normal rate and regular rhythm.   Pulmonary/Chest: Effort normal and breath sounds normal. No respiratory distress. She has no wheezes. She has no rales.  Musculoskeletal:  Patient has trace  edema right lower extremity and slight asymmetry the with edema compared to left. No calf tenderness. No abnormal color changes.  Neurological: She is alert and oriented to person, place, and time.       Assessment & Plan:  Right leg pain. She's had prior history of acute DVT and evidence for chronic DVT by Doppler last June. She does have some very mild asymmetric edema compared to left but this is been more or less chronic. Set up for venous Doppler and we'll try to get this afternoon.

## 2013-11-23 NOTE — Progress Notes (Signed)
Right lower extremity venous duplex completed 

## 2013-11-23 NOTE — Telephone Encounter (Signed)
Discussed with patient. She had not noted any enlarged nodes.  She will schedule follow up next week to reassess.

## 2013-11-23 NOTE — Telephone Encounter (Signed)
Pt called and would like to know what she need to do now that she did not have a blood clot

## 2013-11-23 NOTE — Telephone Encounter (Signed)
Matt from [facility] called to notify you the pt was NEGATIVE for DVT and has SEVERAL ENLARGED LYMPH NODES IN GROIN AREA.

## 2013-11-23 NOTE — Progress Notes (Signed)
Pre visit review using our clinic review tool, if applicable. No additional management support is needed unless otherwise documented below in the visit note. 

## 2013-11-29 ENCOUNTER — Encounter: Payer: Self-pay | Admitting: Family Medicine

## 2013-11-29 ENCOUNTER — Ambulatory Visit (INDEPENDENT_AMBULATORY_CARE_PROVIDER_SITE_OTHER): Payer: Medicare Other | Admitting: Family Medicine

## 2013-11-29 VITALS — BP 120/80 | HR 85 | Temp 98.4°F | Wt 230.0 lb

## 2013-11-29 DIAGNOSIS — M79604 Pain in right leg: Secondary | ICD-10-CM

## 2013-11-29 DIAGNOSIS — R42 Dizziness and giddiness: Secondary | ICD-10-CM

## 2013-11-29 DIAGNOSIS — M79609 Pain in unspecified limb: Secondary | ICD-10-CM

## 2013-11-29 LAB — CBC WITH DIFFERENTIAL/PLATELET
BASOS PCT: 0.8 % (ref 0.0–3.0)
Basophils Absolute: 0.1 10*3/uL (ref 0.0–0.1)
EOS ABS: 0.2 10*3/uL (ref 0.0–0.7)
Eosinophils Relative: 3 % (ref 0.0–5.0)
HCT: 38.1 % (ref 36.0–46.0)
Hemoglobin: 12.8 g/dL (ref 12.0–15.0)
LYMPHS PCT: 22.1 % (ref 12.0–46.0)
Lymphs Abs: 1.6 10*3/uL (ref 0.7–4.0)
MCHC: 33.5 g/dL (ref 30.0–36.0)
MCV: 92.1 fl (ref 78.0–100.0)
MONO ABS: 0.7 10*3/uL (ref 0.1–1.0)
Monocytes Relative: 10 % (ref 3.0–12.0)
Neutro Abs: 4.8 10*3/uL (ref 1.4–7.7)
Neutrophils Relative %: 64.1 % (ref 43.0–77.0)
PLATELETS: 340 10*3/uL (ref 150.0–400.0)
RBC: 4.13 Mil/uL (ref 3.87–5.11)
RDW: 13.5 % (ref 11.5–15.5)
WBC: 7.4 10*3/uL (ref 4.0–10.5)

## 2013-11-29 LAB — BASIC METABOLIC PANEL
BUN: 13 mg/dL (ref 6–23)
CO2: 31 mEq/L (ref 19–32)
Calcium: 9.4 mg/dL (ref 8.4–10.5)
Chloride: 101 mEq/L (ref 96–112)
Creatinine, Ser: 1 mg/dL (ref 0.4–1.2)
GFR: 57.08 mL/min — AB (ref >60.00)
Glucose, Bld: 90 mg/dL (ref 70–99)
Potassium: 4.7 mEq/L (ref 3.5–5.1)
SODIUM: 140 meq/L (ref 135–145)

## 2013-11-29 NOTE — Progress Notes (Signed)
Subjective:    Patient ID: Ashley Castaneda, female    DOB: [birth year], 63 y.o.   MRN: [id number]  HPI Patient seen for followup right lower extremity pain. Prior history of DVT right lower extremity. Recent venous Dopplers revealed no evidence for recurrent DVT. There was comment of right groin adenopathy. Patient has not noticed any lymphadenopathy in this region. She's not seem any lower extremity skin rashes  She has vague pains which radiate from her lumbar region of back all the way down to the foot at times. She describes some diffuse achiness of the right lower extremity. No claudication-type symptoms. Denies any lower extremity numbness or weakness. No loss of urine or stool continence. She has seen multiple orthopedists in the past. She has history of deep brain stimulator cannot have further MRI scans to assess her lumbar region  She complains of nonspecific lightheadedness. Not clearly orthostatic related. No syncope. No vertigo. No focal weakness. She is staying well hydrated  Past Medical History  Diagnosis Date  . Unspecified vitamin D deficiency 03/31/2010  . ANXIETY 07/01/2008  . DEPRESSION 07/01/2008  . REFLEX SYMPATHETIC DYSTROPHY 07/01/2008  . Trigeminal neuralgia 05/21/2009  . Other specified trigeminal nerve disorders 07/01/2008  . ALLERGIC RHINITIS 07/01/2008  . BRONCHITIS, CHRONIC 07/01/2008  . GERD 07/01/2008  . ECZEMA 05/29/2009  . OSTEOARTHRITIS 07/01/2008  . OCCIPITAL NEURALGIA 07/01/2008  . BACK PAIN, THORACIC REGION 03/04/2010  . FIBROMYALGIA 07/01/2008  . LEG PAIN, BILATERAL 07/01/2008  . FACIAL PAIN 12/25/2009  . CARPAL TUNNEL SYNDROME, HX OF 07/01/2008  . DVT, HX OF 07/01/2008  . MIGRAINES, HX OF 07/01/2008  . CHRONIC OBSTRUCTIVE PULMONARY DISEASE, ACUTE EXACERBATION 08/07/2010  . Hyperlipidemia    Past Surgical History  Procedure Laterality Date  . Lavh      BSO  . Tonsillectomy  1957  . Right foot bone spur  1987  . Left knee surgery  1989  . Right knee surgery   1990  . Right soulder surgery  [place]  . Head craniectomy  1994    MICROVASULAR DECOMPRESSION  . Right foot surgery  1998    BAD CUT  . Right jaw surgery  2002  . Right thumb sugery  2004  . Carpal tunnel release  2009    RIGHT  . Occipital nerve stimulator insertion      reports that she quit smoking about 21 months ago. Her smoking use included Cigarettes. She has a 60 pack-year smoking history. She has never used smokeless tobacco. She reports that she does not drink alcohol or use illicit drugs. family history includes Heart attack in her maternal grandmother; Hypertension in her father; Ovarian cancer (age of onset: 49) in her mother; Prostate cancer in her father; Stroke in her paternal grandmother. Allergies  Allergen Reactions  . Cefazolin     Ancef - in surgery, swelled up, turned blue, heart problems  . Baclofen     REACTION: confusion  . Carbamazepine     REACTION: confusion  . Ceclor [Cefaclor]     hives  . Fentanyl     REACTION: nausea and vomiting  . Gabapentin     REACTION: confusion  . Oxcarbazepine     REACTION: confusion      Review of Systems  Constitutional: Positive for fatigue.  Eyes: Negative for visual disturbance.  Respiratory: Negative for cough, chest tightness, shortness of breath and wheezing.   Cardiovascular: Negative for chest pain and palpitations.  Gastrointestinal: Negative for abdominal pain.  Neurological: Positive for dizziness and light-headedness. Negative for seizures, syncope, weakness and numbness.  Hematological: Negative for adenopathy.       Objective:   Physical Exam  Constitutional: She is oriented to person, place, and time. She appears well-developed and well-nourished.  Cardiovascular: Normal rate and regular rhythm.  Exam reveals no gallop.   Pulmonary/Chest: Effort normal and breath sounds normal. No respiratory distress. She has no wheezes. She has no rales.  Musculoskeletal:  Straight leg raises  are negative. No pitting edema lower extremities. Right groin and left groin were assessed. No significant inguinal adenopathy noted by exam  Neurological: She is alert and oriented to person, place, and time. No cranial nerve deficit.  Full-strength lower extremities  Skin: No rash noted.          Assessment & Plan:  #1 right lower extremity pain. Suspect radicular. Nonfocal exam neurologically. Recent Doppler negative for DVT. No appreciated inguinal adenopathy on exam today #2 nonspecific lightheadedness and dizziness. Check labs with CBC and basic metabolic panel. She is not describing orthostatic changes today and blood pressure stable. Stay well hydrated.

## 2013-11-29 NOTE — Progress Notes (Signed)
Pre visit review using our clinic review tool, if applicable. No additional management support is needed unless otherwise documented below in the visit note. 

## 2013-11-30 ENCOUNTER — Telehealth: Payer: Self-pay

## 2013-11-30 NOTE — Telephone Encounter (Signed)
Pt informed

## 2013-11-30 NOTE — Telephone Encounter (Signed)
Pt wants to know what she can do about her leg and feet swelling. Stated yesterday after she left her leg and foot was swelling and painful.

## 2013-11-30 NOTE — Telephone Encounter (Signed)
Increase furosemide 20 mg to two tablets daily for 3-4 days, then try dropping back to one daily.

## 2013-12-09 ENCOUNTER — Emergency Department (INDEPENDENT_AMBULATORY_CARE_PROVIDER_SITE_OTHER)
Admission: EM | Admit: 2013-12-09 | Discharge: 2013-12-09 | Disposition: A | Discharge status: Home / Self Care | Payer: Medicare Other | Source: Home / Self Care | Attending: Family Medicine | Admitting: Family Medicine

## 2013-12-09 ENCOUNTER — Encounter (HOSPITAL_COMMUNITY): Payer: Self-pay | Admitting: Emergency Medicine

## 2013-12-09 DIAGNOSIS — R42 Dizziness and giddiness: Secondary | ICD-10-CM | POA: Diagnosis not present

## 2013-12-09 LAB — POCT I-STAT, CHEM 8
BUN: 15 mg/dL (ref 6–23)
CREATININE: 0.9 mg/dL (ref 0.50–1.10)
Calcium, Ion: 1.12 mmol/L — ABNORMAL LOW (ref 1.13–1.30)
Chloride: 103 mEq/L (ref 96–112)
Glucose, Bld: 96 mg/dL (ref 70–99)
HCT: 41 % (ref 36.0–46.0)
HEMOGLOBIN: 13.9 g/dL (ref 12.0–15.0)
POTASSIUM: 4 meq/L (ref 3.7–5.3)
SODIUM: 140 meq/L (ref 137–147)
TCO2: 24 mmol/L (ref 0–100)

## 2013-12-09 LAB — TSH: TSH: 3.53 u[IU]/mL (ref 0.350–4.500)

## 2013-12-09 NOTE — Discharge Instructions (Signed)
Thank you for coming in today. Avoid lasix until your doctor restarts it.  Follow up with your primary doctor in 1-2 days.  Go to the emergency room if you get worse.  Please use a walker at home.  Come back or go to the emergency room if you notice new weakness new numbness problems walking or bowel or bladder problems.  Dizziness Dizziness is a common problem. It is a feeling of unsteadiness or lightheadedness. You may feel like you are about to faint. Dizziness can lead to injury if you stumble or fall. A person of any age group can suffer from dizziness, but dizziness is more common in older adults. CAUSES  Dizziness can be caused by many different things, including:  Middle ear problems.  Standing for too long.  Infections.  An allergic reaction.  Aging.  An emotional response to something, such as the sight of blood.  Side effects of medicines.  Fatigue.  Problems with circulation or blood pressure.  Excess use of alcohol, medicines, or illegal drug use.  Breathing too fast (hyperventilation).  An arrhythmia or problems with your heart rhythm.  Low red blood cell count (anemia).  Pregnancy.  Vomiting, diarrhea, fever, or other illnesses that cause dehydration.  Diseases or conditions such as Parkinson's disease, high blood pressure (hypertension), diabetes, and thyroid problems.  Exposure to extreme heat. DIAGNOSIS  To find the cause of your dizziness, your caregiver may do a physical exam, lab tests, radiologic imaging scans, or an electrocardiography test (ECG).  TREATMENT  Treatment of dizziness depends on the cause of your symptoms and can vary greatly. HOME CARE INSTRUCTIONS   Drink enough fluids to keep your urine clear or pale yellow. This is especially important in very hot weather. In the elderly, it is also important in cold weather.  If your dizziness is caused by medicines, take them exactly as directed. When taking blood pressure medicines, it  is especially important to get up slowly.  Rise slowly from chairs and steady yourself until you feel okay.  In the morning, first sit up on the side of the bed. When this seems okay, stand slowly while holding onto something until you know your balance is fine.  If you need to stand in one place for a long time, be sure to move your legs often. Tighten and relax the muscles in your legs while standing.  If dizziness continues to be a problem, have someone stay with you for a day or two. Do this until you feel you are well enough to stay alone. Have the person call your caregiver if he or she notices changes in you that are concerning.  Do not drive or use heavy machinery if you feel dizzy.  Do not drink alcohol. SEEK IMMEDIATE MEDICAL CARE IF:   Your dizziness or lightheadedness gets worse.  You feel nauseous or vomit.  You develop problems with talking, walking, weakness, or using your arms, hands, or legs.  You are not thinking clearly or you have difficulty forming sentences. It may take a friend or family member to determine if your thinking is normal.  You develop chest pain, abdominal pain, shortness of breath, or sweating.  Your vision changes.  You notice any bleeding.  You have side effects from medicine that seems to be getting worse rather than better. MAKE SURE YOU:   Understand these instructions.  Will watch your condition.  Will get help right away if you are not doing well or get worse. Document  Released: 01/05/2001 Document Revised: 10/04/2011 Document Reviewed: 01/29/2011 [facility] Patient Information 2014 [place], [place].

## 2013-12-09 NOTE — ED Provider Notes (Signed)
Ashley Castaneda is a 63 y.o. female who presents to Urgent Care today for weakness and fatigue present for the last 5 days. Patient was seen by her primary care provider on May 7 for leg swelling. Her furosemide dose was doubled for several days. She started feeling strange and discontinued  the furosemide and her rapaflo a few days ago. She continues to feel fatigued and lightheaded. She feels somewhat unstable despite these changes. She denies any chest pains or shortness of breath. She denies any palpitations. She denies any other medication changes. She feels well otherwise.   Past Medical History  Diagnosis Date  . Unspecified vitamin D deficiency 03/31/2010  . ANXIETY 07/01/2008  . DEPRESSION 07/01/2008  . REFLEX SYMPATHETIC DYSTROPHY 07/01/2008  . Trigeminal neuralgia 05/21/2009  . Other specified trigeminal nerve disorders 07/01/2008  . ALLERGIC RHINITIS 07/01/2008  . BRONCHITIS, CHRONIC 07/01/2008  . GERD 07/01/2008  . ECZEMA 05/29/2009  . OSTEOARTHRITIS 07/01/2008  . OCCIPITAL NEURALGIA 07/01/2008  . BACK PAIN, THORACIC REGION 03/04/2010  . FIBROMYALGIA 07/01/2008  . LEG PAIN, BILATERAL 07/01/2008  . FACIAL PAIN 12/25/2009  . CARPAL TUNNEL SYNDROME, HX OF 07/01/2008  . DVT, HX OF 07/01/2008  . MIGRAINES, HX OF 07/01/2008  . CHRONIC OBSTRUCTIVE PULMONARY DISEASE, ACUTE EXACERBATION 08/07/2010  . Hyperlipidemia    History  Substance Use Topics  . Smoking status: Former Smoker -- 2.00 packs/day for 30 years    Types: Cigarettes    Quit date: 02/04/2012  . Smokeless tobacco: Never Used  . Alcohol Use: No   ROS as above Medications: No current facility-administered medications for this encounter.   Current Outpatient Prescriptions  Medication Sig Dispense Refill  . ALPRAZolam (XANAX) 0.5 MG tablet Take 1 tablet (0.5 mg total) by mouth 3 (three) times daily as needed.  270 tablet  1  . calcium carbonate (OS-CAL - DOSED IN MG OF ELEMENTAL CALCIUM) 1250 MG tablet Take 1 tablet by mouth daily.       . cetirizine (ZYRTEC) 10 MG tablet Take 10 mg by mouth daily.        . Cinnamon 500 MG capsule Take 1,000 mg by mouth daily.       . hydrOXYzine (ATARAX) 10 MG tablet Take 10 mg by mouth every 8 (eight) hours as needed. For dizziness per Renaldo Reel, MD      . lidocaine (LIDODERM) 5 % Place 1 patch onto the skin daily. Remove & Discard patch within 12 hours or as directed by Renaldo Reel MD      . metaxalone (SKELAXIN) 800 MG tablet Take 800 mg by mouth 3 (three) times daily.        . montelukast (SINGULAIR) 10 MG tablet Take 1 tablet (10 mg total) by mouth daily at 6 PM.  90 tablet  3  . Multiple Vitamin (MULTIVITAMIN) tablet Take 1 tablet by mouth every morning.       . Omega-3 Fatty Acids (FISH OIL) 1200 MG CAPS Take 1 capsule by mouth every evening.      . RABEprazole (ACIPHEX) 20 MG tablet Take 1 tablet (20 mg total) by mouth every morning.  90 tablet  3  . silodosin (RAPAFLO) 8 MG CAPS capsule Take 1 capsule (8 mg total) by mouth daily with breakfast.  90 capsule  2  . vitamin E 400 UNIT capsule Take 400 Units by mouth daily at 6 PM.       . cycloSPORINE (RESTASIS) 0.05 % ophthalmic emulsion Place 1 drop into both eyes  2 (two) times daily.      . fluconazole (DIFLUCAN) 150 MG tablet Take 1 tablet (150 mg total) by mouth once.  10 tablet  2  . fluticasone (FLONASE) 50 MCG/ACT nasal spray Place 2 sprays into the nose daily as needed. For nasal congestion  16 g  3  . frovatriptan (FROVA) 2.5 MG tablet Take 2.5 mg by mouth as needed. If recurs, may repeat after 2 hours. Max of 3 tabs in 24 hours. For migraines.      . furosemide (LASIX) 20 MG tablet Take 1 tablet (20 mg total) by mouth daily. For swelling  90 tablet  3  . guaiFENesin (MUCINEX) 600 MG 12 hr tablet Take 1,200 mg by mouth 2 (two) times daily.      Marland Kitchen HYDROmorphone (DILAUDID) 4 MG tablet Take 4 mg by mouth as needed for severe pain.      . metoclopramide (REGLAN) 10 MG tablet Take 10 mg by mouth 3 (three) times daily as needed. For  nausea      . potassium chloride (K-DUR) 10 MEQ tablet Take 1 tablet (10 mEq total) by mouth daily as needed.  60 tablet  3  . tiZANidine (ZANAFLEX) 2 MG tablet Take 2 mg by mouth at bedtime.        Exam:  BP 148/88  Pulse 103  Temp(Src) 97.9 F (36.6 C) (Oral)  Resp 16  SpO2 98%  LMP 07/26/1992 Filed Vitals:   12/09/13 1800 12/09/13 1821 12/09/13 1823 12/09/13 1824  BP: 139/81 143/73 140/82 148/88  Pulse: 92 80 99 103  Temp: 97.9 F (36.6 C)     TempSrc: Oral     Resp: 16     SpO2: 98%       Gen: Well NAD HEENT: EOMI,  MMM no JVD PERRLA bilateral Lungs: Normal work of breathing. CTABL Heart: RRR no MRG Abd: NABS, Soft. NT, ND Exts: Brisk capillary refill, warm and well perfused.  Neuro: Alert and oriented cranial nerves II through XII are intact with the exception of slight decrease sensation in the right side of the face which is normal for the patient. Upper extremity strength is intact throughout Reflexes are diminished but equal bilateral upper and lower extremities Balance is intact with negative Romberg Coordination is intact bilateral. Normal cerebellar testing with rapid alternating movement and shin scratch bilaterally. Gait is abnormal. Patient has to hold onto the wall to steady herself. She is unable to complete tandem gait. Lower extremity strength is intact bilaterally.  Twelve-lead EKG shows normal sinus rhythm at 76 beats per minute. No ST segment elevation or depression.   Results for orders placed during the hospital encounter of 12/09/13 (from the past 24 hour(s))  POCT I-STAT, CHEM 8     Status: Abnormal   Collection Time    12/09/13  6:40 PM      Result Value Ref Range   Sodium 140  137 - 147 mEq/L   Potassium 4.0  3.7 - 5.3 mEq/L   Chloride 103  96 - 112 mEq/L   BUN 15  6 - 23 mg/dL   Creatinine, Ser 0.90  0.50 - 1.10 mg/dL   Glucose, Bld 96  70 - 99 mg/dL   Calcium, Ion 1.12 (*) 1.13 - 1.30 mmol/L   TCO2 24  0 - 100 mmol/L   Hemoglobin  13.9  12.0 - 15.0 g/dL   HCT 41.0  36.0 - 46.0 %   No results found.  Assessment and Plan: 63  y.o. female with unsteadiness.  Patient has been somewhat unsteady on her feet for the past week.  The etiology at this time is somewhat unclear. Her heart rate increases somewhat with standing however her blood pressure remained stable. She denies any syncopal sensations. The most abnormal neurologic exam finding is an unsteadiness with gait. She however has no focal exam findings consistent with stroke. At this point I'm somewhat unsure of the cause of her unsteadiness. It may be related to her complex regional pain syndrome or trigeminal neuralgia stimulator or medication.  Patient is very reluctant to present to the emergency room. I feel is reasonable for her to wait a day or two and followup with her primary care provider for further evaluation and management. She may benefit from referral to neurology. Recommend patient use a walker at home and avoid further Lasix until cleared by primary care provider.  Carefully reviewed precautions to present to the emergency room. She expresses understanding and agreement.   Discussed warning signs or symptoms. Please see discharge instructions. Patient expresses understanding.    Gregor Hams, MD 12/09/13 [id number]

## 2013-12-09 NOTE — ED Notes (Signed)
Patient complains of weakness and confusion since Monday.

## 2013-12-10 ENCOUNTER — Emergency Department (HOSPITAL_COMMUNITY): Payer: Medicare Other

## 2013-12-10 ENCOUNTER — Encounter (HOSPITAL_COMMUNITY): Payer: Self-pay | Admitting: Emergency Medicine

## 2013-12-10 ENCOUNTER — Telehealth: Payer: Self-pay | Admitting: Family Medicine

## 2013-12-10 ENCOUNTER — Emergency Department (HOSPITAL_COMMUNITY)
Admission: EM | Admit: 2013-12-10 | Discharge: 2013-12-10 | Disposition: A | Discharge status: Home / Self Care | Payer: Medicare Other | Attending: Emergency Medicine | Admitting: Emergency Medicine

## 2013-12-10 DIAGNOSIS — M7989 Other specified soft tissue disorders: Secondary | ICD-10-CM | POA: Insufficient documentation

## 2013-12-10 DIAGNOSIS — G43909 Migraine, unspecified, not intractable, without status migrainosus: Secondary | ICD-10-CM | POA: Diagnosis not present

## 2013-12-10 DIAGNOSIS — E559 Vitamin D deficiency, unspecified: Secondary | ICD-10-CM | POA: Diagnosis not present

## 2013-12-10 DIAGNOSIS — J449 Chronic obstructive pulmonary disease, unspecified: Secondary | ICD-10-CM | POA: Diagnosis not present

## 2013-12-10 DIAGNOSIS — F3289 Other specified depressive episodes: Secondary | ICD-10-CM | POA: Diagnosis not present

## 2013-12-10 DIAGNOSIS — Z872 Personal history of diseases of the skin and subcutaneous tissue: Secondary | ICD-10-CM | POA: Insufficient documentation

## 2013-12-10 DIAGNOSIS — M6281 Muscle weakness (generalized): Secondary | ICD-10-CM | POA: Insufficient documentation

## 2013-12-10 DIAGNOSIS — K219 Gastro-esophageal reflux disease without esophagitis: Secondary | ICD-10-CM | POA: Insufficient documentation

## 2013-12-10 DIAGNOSIS — J4489 Other specified chronic obstructive pulmonary disease: Secondary | ICD-10-CM | POA: Insufficient documentation

## 2013-12-10 DIAGNOSIS — Z8669 Personal history of other diseases of the nervous system and sense organs: Secondary | ICD-10-CM | POA: Diagnosis not present

## 2013-12-10 DIAGNOSIS — Z8739 Personal history of other diseases of the musculoskeletal system and connective tissue: Secondary | ICD-10-CM | POA: Insufficient documentation

## 2013-12-10 DIAGNOSIS — F411 Generalized anxiety disorder: Secondary | ICD-10-CM | POA: Insufficient documentation

## 2013-12-10 DIAGNOSIS — F329 Major depressive disorder, single episode, unspecified: Secondary | ICD-10-CM | POA: Diagnosis not present

## 2013-12-10 DIAGNOSIS — Z9889 Other specified postprocedural states: Secondary | ICD-10-CM | POA: Insufficient documentation

## 2013-12-10 DIAGNOSIS — R42 Dizziness and giddiness: Secondary | ICD-10-CM | POA: Diagnosis not present

## 2013-12-10 DIAGNOSIS — Z86718 Personal history of other venous thrombosis and embolism: Secondary | ICD-10-CM | POA: Insufficient documentation

## 2013-12-10 DIAGNOSIS — Z87891 Personal history of nicotine dependence: Secondary | ICD-10-CM | POA: Diagnosis not present

## 2013-12-10 DIAGNOSIS — R29898 Other symptoms and signs involving the musculoskeletal system: Secondary | ICD-10-CM

## 2013-12-10 DIAGNOSIS — Z79899 Other long term (current) drug therapy: Secondary | ICD-10-CM | POA: Insufficient documentation

## 2013-12-10 MED ORDER — MECLIZINE HCL 25 MG PO TABS: 25.0000 mg | ORAL_TABLET | Freq: Three times a day (TID) | ORAL | Status: DC | PRN

## 2013-12-10 MED ORDER — MECLIZINE HCL 25 MG PO TABS
25.0000 mg | ORAL_TABLET | Freq: Once | ORAL | Status: AC
  Administered 2013-12-10: 25 mg via ORAL
  Filled 2013-12-10: qty 1

## 2013-12-10 NOTE — ED Notes (Signed)
Pt ambulated to restroom- 1 person assist.

## 2013-12-10 NOTE — Telephone Encounter (Signed)
Pt is needing a post er/fu/ with 1 to 2 days. Pt was seen for dizziness, weakness, legs swollen and numb, pt states she is not stable at yall, pt states the er informed her to contact her pcp immediately, and also suggested she see a neurologist for a ct scan on her head. Pt states she wanted dr. Elease Hashimoto input and to see if she can get in to see him asap.

## 2013-12-10 NOTE — ED Notes (Addendum)
Was seen at [facility] yesterday  htn and dizziness and weak legs  X 1 week had some tests done (and they wanted her to come to er  Last night but she did not  )was checked for dvt neg and then doubled her fluid med x 3 days but that was not it. Could not get into see her pcp

## 2013-12-10 NOTE — Discharge Instructions (Signed)
Please follow up with Neurology as soon as possible.  Dizziness Dizziness is a common problem. It is a feeling of unsteadiness or lightheadedness. You may feel like you are about to faint. Dizziness can lead to injury if you stumble or fall. A person of any age group can suffer from dizziness, but dizziness is more common in older adults. CAUSES  Dizziness can be caused by many different things, including:  Middle ear problems.  Standing for too long.  Infections.  An allergic reaction.  Aging.  An emotional response to something, such as the sight of blood.  Side effects of medicines.  Fatigue.  Problems with circulation or blood pressure.  Excess use of alcohol, medicines, or illegal drug use.  Breathing too fast (hyperventilation).  An arrhythmia or problems with your heart rhythm.  Low red blood cell count (anemia).  Pregnancy.  Vomiting, diarrhea, fever, or other illnesses that cause dehydration.  Diseases or conditions such as Parkinson's disease, high blood pressure (hypertension), diabetes, and thyroid problems.  Exposure to extreme heat. DIAGNOSIS  To find the cause of your dizziness, your caregiver may do a physical exam, lab tests, radiologic imaging scans, or an electrocardiography test (ECG).  TREATMENT  Treatment of dizziness depends on the cause of your symptoms and can vary greatly. HOME CARE INSTRUCTIONS   Drink enough fluids to keep your urine clear or pale yellow. This is especially important in very hot weather. In the elderly, it is also important in cold weather.  If your dizziness is caused by medicines, take them exactly as directed. When taking blood pressure medicines, it is especially important to get up slowly.  Rise slowly from chairs and steady yourself until you feel okay.  In the morning, first sit up on the side of the bed. When this seems okay, stand slowly while holding onto something until you know your balance is fine.  If you  need to stand in one place for a long time, be sure to move your legs often. Tighten and relax the muscles in your legs while standing.  If dizziness continues to be a problem, have someone stay with you for a day or two. Do this until you feel you are well enough to stay alone. Have the person call your caregiver if he or she notices changes in you that are concerning.  Do not drive or use heavy machinery if you feel dizzy.  Do not drink alcohol. SEEK IMMEDIATE MEDICAL CARE IF:   Your dizziness or lightheadedness gets worse.  You feel nauseous or vomit.  You develop problems with talking, walking, weakness, or using your arms, hands, or legs.  You are not thinking clearly or you have difficulty forming sentences. It may take a friend or family member to determine if your thinking is normal.  You develop chest pain, abdominal pain, shortness of breath, or sweating.  Your vision changes.  You notice any bleeding.  You have side effects from medicine that seems to be getting worse rather than better. MAKE SURE YOU:   Understand these instructions.  Will watch your condition.  Will get help right away if you are not doing well or get worse. Document Released: 01/05/2001 Document Revised: 10/04/2011 Document Reviewed: 01/29/2011 [facility] Patient Information 2014 [place], [place].

## 2013-12-10 NOTE — Telephone Encounter (Signed)
Spoke with pt, she states that she is headed back to the ER now.  She was told yesterday when she was there that if she had any change in condition to come back.  Pt states that her legs are extremely weak.  They wanted to admit her to the ER yesterday but she declined.  I informed the pt to give [place] a call with an update

## 2013-12-10 NOTE — ED Provider Notes (Signed)
CSN: [id number]     Arrival date & time 12/10/13  1408 History   First MD Initiated Contact with Patient 12/10/13 1748     No chief complaint on file.    (Consider location/radiation/quality/duration/timing/severity/associated sxs/prior Treatment) HPI Comments: Here for bilateral leg weakness that is worsening. Seen PCP last week for bilateral leg swelling - recommended to increase her lasix dosing. Doubled lasix for 3 days, then stopped. Now having worsening leg weakness. Saw Urgent Care last night - reported to have difficulty with ambulation. Urgent Care wanted her to get seen for possible stroke, but she wanted to go home.  Patient is a 63 y.o. female presenting with weakness. The history is provided by the patient.  Weakness This is a new problem. The current episode started more than 1 week ago. The problem occurs constantly. The problem has been gradually worsening. Pertinent negatives include no chest pain, no abdominal pain and no shortness of breath. Nothing aggravates the symptoms. Nothing relieves the symptoms.    Past Medical History  Diagnosis Date  . Unspecified vitamin D deficiency 03/31/2010  . ANXIETY 07/01/2008  . DEPRESSION 07/01/2008  . REFLEX SYMPATHETIC DYSTROPHY 07/01/2008  . Trigeminal neuralgia 05/21/2009  . Other specified trigeminal nerve disorders 07/01/2008  . ALLERGIC RHINITIS 07/01/2008  . BRONCHITIS, CHRONIC 07/01/2008  . GERD 07/01/2008  . ECZEMA 05/29/2009  . OSTEOARTHRITIS 07/01/2008  . OCCIPITAL NEURALGIA 07/01/2008  . BACK PAIN, THORACIC REGION 03/04/2010  . FIBROMYALGIA 07/01/2008  . LEG PAIN, BILATERAL 07/01/2008  . FACIAL PAIN 12/25/2009  . CARPAL TUNNEL SYNDROME, HX OF 07/01/2008  . DVT, HX OF 07/01/2008  . MIGRAINES, HX OF 07/01/2008  . CHRONIC OBSTRUCTIVE PULMONARY DISEASE, ACUTE EXACERBATION 08/07/2010  . Hyperlipidemia    Past Surgical History  Procedure Laterality Date  . Lavh      BSO  . Tonsillectomy  1957  . Right foot bone spur  1987  . Left  knee surgery  1989  . Right knee surgery  1990  . Right soulder surgery  [place]  . Head craniectomy  1994    MICROVASULAR DECOMPRESSION  . Right foot surgery  1998    BAD CUT  . Right jaw surgery  2002  . Right thumb sugery  2004  . Carpal tunnel release  2009    RIGHT  . Occipital nerve stimulator insertion     Family History  Problem Relation Age of Onset  . Ovarian cancer Mother 76  . Hypertension Father   . Prostate cancer Father   . Heart attack Maternal Grandmother   . Stroke Paternal Grandmother    History  Substance Use Topics  . Smoking status: Former Smoker -- 2.00 packs/day for 30 years    Types: Cigarettes    Quit date: 02/04/2012  . Smokeless tobacco: Never Used  . Alcohol Use: No   OB History   Grav Para Term Preterm Abortions TAB SAB Ect Mult Living   0              Review of Systems  Constitutional: Negative for fever and chills.  Respiratory: Negative for cough and shortness of breath.   Cardiovascular: Negative for chest pain.  Gastrointestinal: Negative for vomiting and abdominal pain.  Neurological: Positive for weakness.  All other systems reviewed and are negative.     Allergies  Cefazolin; Baclofen; Carbamazepine; Ceclor; Fentanyl; Gabapentin; and Oxcarbazepine  Home Medications   Prior to Admission medications   Medication Sig Start Date End Date Taking? Authorizing  Provider  ALPRAZolam Duanne Moron) 0.5 MG tablet Take 1 tablet (0.5 mg total) by mouth 3 (three) times daily as needed. 10/22/13   Eulas Post, MD  calcium carbonate (OS-CAL - DOSED IN MG OF ELEMENTAL CALCIUM) 1250 MG tablet Take 1 tablet by mouth daily.    Historical Provider, MD  cetirizine (ZYRTEC) 10 MG tablet Take 10 mg by mouth daily.      Historical Provider, MD  Cinnamon 500 MG capsule Take 1,000 mg by mouth daily.     Historical Provider, MD  cycloSPORINE (RESTASIS) 0.05 % ophthalmic emulsion Place 1 drop into both eyes 2 (two) times daily.     Historical Provider, MD  fluconazole (DIFLUCAN) 150 MG tablet Take 1 tablet (150 mg total) by mouth once. 07/06/13   Anastasio Auerbach, MD  fluticasone (FLONASE) 50 MCG/ACT nasal spray Place 2 sprays into the nose daily as needed. For nasal congestion 05/31/12 11/29/13  Eulas Post, MD  frovatriptan (FROVA) 2.5 MG tablet Take 2.5 mg by mouth as needed. If recurs, may repeat after 2 hours. Max of 3 tabs in 24 hours. For migraines.    Historical Provider, MD  furosemide (LASIX) 20 MG tablet Take 1 tablet (20 mg total) by mouth daily. For swelling 05/31/12   Eulas Post, MD  guaiFENesin (MUCINEX) 600 MG 12 hr tablet Take 1,200 mg by mouth 2 (two) times daily.    Historical Provider, MD  HYDROmorphone (DILAUDID) 4 MG tablet Take 4 mg by mouth as needed for severe pain.    Historical Provider, MD  hydrOXYzine (ATARAX) 10 MG tablet Take 10 mg by mouth every 8 (eight) hours as needed. For dizziness per Renaldo Reel, MD    Historical Provider, MD  lidocaine (LIDODERM) 5 % Place 1 patch onto the skin daily. Remove & Discard patch within 12 hours or as directed by Renaldo Reel MD    Historical Provider, MD  metaxalone (SKELAXIN) 800 MG tablet Take 800 mg by mouth 3 (three) times daily.      Historical Provider, MD  metoclopramide (REGLAN) 10 MG tablet Take 10 mg by mouth 3 (three) times daily as needed. For nausea    Historical Provider, MD  montelukast (SINGULAIR) 10 MG tablet Take 1 tablet (10 mg total) by mouth daily at 6 PM. 05/15/13   Eulas Post, MD  Multiple Vitamin (MULTIVITAMIN) tablet Take 1 tablet by mouth every morning.     Historical Provider, MD  Omega-3 Fatty Acids (FISH OIL) 1200 MG CAPS Take 1 capsule by mouth every evening.    Historical Provider, MD  potassium chloride (K-DUR) 10 MEQ tablet Take 1 tablet (10 mEq total) by mouth daily as needed. 06/06/12   Eulas Post, MD  RABEprazole (ACIPHEX) 20 MG tablet Take 1 tablet (20 mg total) by mouth every morning. 05/15/13   Eulas Post, MD  silodosin (RAPAFLO) 8 MG CAPS capsule Take 1 capsule (8 mg total) by mouth daily with breakfast. 10/19/13   Eulas Post, MD  tiZANidine (ZANAFLEX) 2 MG tablet Take 2 mg by mouth at bedtime.    Historical Provider, MD  vitamin E 400 UNIT capsule Take 400 Units by mouth daily at 6 PM.     Historical Provider, MD   BP 127/60  Pulse 78  Temp(Src) 98.1 F (36.7 C) (Oral)  Resp 18  Wt 230 lb (104.327 kg)  SpO2 99%  LMP 07/26/1992 Physical Exam  Nursing note and vitals reviewed. Constitutional: She is oriented to  person, place, and time. She appears well-developed and well-nourished. No distress.  HENT:  Head: Normocephalic and atraumatic.  Eyes: EOM are normal. Pupils are equal, round, and reactive to light.  Neck: Normal range of motion. Neck supple.  Cardiovascular: Normal rate and regular rhythm.  Exam reveals no friction rub.   No murmur heard. Pulmonary/Chest: Effort normal and breath sounds normal. No respiratory distress. She has no wheezes. She has no rales.  Abdominal: Soft. She exhibits no distension. There is no tenderness. There is no rebound.  Musculoskeletal: Normal range of motion. She exhibits no edema.  Neurological: She is alert and oriented to person, place, and time. A sensory deficit (R hand - chronic) is present. No cranial nerve deficit. She exhibits normal muscle tone. GCS eye subscore is 4. GCS verbal subscore is 5. GCS motor subscore is 6.  Skin: She is not diaphoretic.    ED Course  Procedures (including critical care time) Labs Review Labs Reviewed - No data to display  Imaging Review No results found.   EKG Interpretation   Date/Time:  Monday Dec 10 2013 14:15:14 EDT Ventricular Rate:  86 PR Interval:  130 QRS Duration: 86 QT Interval:  330 QTC Calculation: 394 R Axis:   64 Text Interpretation:  Normal sinus rhythm Similar to prior Confirmed by  Mingo Amber  MD, Marlin ([id number]) on 12/10/2013 5:58:04 PM      MDM   Final diagnoses:   Leg weakness  Dizziness    63 year old female presents with right leg weakness. Began about one week ago. She has chronic right leg pain and states the pain is the same. The weakness began all once and was her entire leg and has not gotten fully better. Will occasionally improve, but then worsens with walking and using the leg. She denies any fevers, urinary incontinence, fecal incontinence, urinary retention. She denies any saddle anesthesia. She has no history of strokes. She was seen at urgent care yesterday for the same leg weakness and she was reluctant to present to the ED at that time. Patient also states some dizziness for past week. Movement sensation, worse with moving around. No N/V. Hx of vertigo, has taken meclizine in the past. No truncal ataxia, normal coordination.  She presented today for worsening weakness. She states that her electrolytes are going to give out on her frequently. Vitals are stable here. No elevated blood pressures. On exam, right leg is strong and equal to her left leg strength. She has reflexes in her right leg, however they appear mildly diminished compared to left. It is difficult to assess reflexes due to her obesity and knee surgery history. On gait testing, not falling to one side, able to ambulate, but states she feels movement sensation. Exam c/w vertigo.  She cannot get an MRI due to her nerve stimulator she has for her trigeminal neuralgia. CT head to look for possible stroke. Normal sensation bilaterally, no red flags concerning for cord compression. No lack of reflexes completely. No ascending weakness consistent with Guillian-Barre syndrome. I spoke with Dr. Nicole Kindred of Neuro who doesn't feel symptoms are c/w stroke. Meclizine given, instructed to use meclizine for dizziness, use walker to help with walking, and to f/u with PCP for her leg weakness.   Osvaldo Shipper, MD 12/10/13 2252

## 2013-12-10 NOTE — ED Notes (Signed)
Pt returned from CT °

## 2013-12-10 NOTE — ED Notes (Signed)
Diet tray ordered 

## 2013-12-12 ENCOUNTER — Encounter: Payer: Self-pay | Admitting: Neurology

## 2013-12-12 ENCOUNTER — Ambulatory Visit (INDEPENDENT_AMBULATORY_CARE_PROVIDER_SITE_OTHER): Payer: Medicare Other | Admitting: Neurology

## 2013-12-12 VITALS — BP 130/76 | HR 85 | Ht 62.7 in | Wt 231.0 lb

## 2013-12-12 DIAGNOSIS — Z87898 Personal history of other specified conditions: Secondary | ICD-10-CM

## 2013-12-12 DIAGNOSIS — IMO0001 Reserved for inherently not codable concepts without codable children: Secondary | ICD-10-CM

## 2013-12-12 DIAGNOSIS — M531 Cervicobrachial syndrome: Secondary | ICD-10-CM

## 2013-12-12 MED ORDER — PREDNISONE 5 MG PO TABS: ORAL_TABLET | ORAL | Status: DC

## 2013-12-12 NOTE — Progress Notes (Signed)
Reason for visit: Headache, visual disturbance  Ashley Castaneda is an 63 y.o. female  History of present illness:  Ashley Castaneda is a 63 year old white female with a history of trigeminal neuralgia, status post surgical procedure in the past for this. The patient has a history of migraine headaches associated with right homonymous visual field aura. The patient indicates that 10 days ago, she noted some swelling of the right leg. The patient underwent a venous Doppler study that was unremarkable. The patient was increased on her Lasix for about 3 days for the swelling. The patient began noting some dizziness and vertigo that was primarily present with standing while on Lasix. Initially, she felt that the medication was the cause of her vertigo. The patient has continued to have dizziness that is ongoing, but she has also noted the usual scintillating scotoma in the right homonymous visual field that she usually gets with her migraine. This has continued. The patient went to the emergency room, and a CT scan of the brain was done, and this did not show any acute changes. The patient has felt some heaviness of the right leg, and she has chronic discomfort in the right leg as well. For the dizziness, she is taking meclizine with some benefit. The patient has chronic tinnitus involving the left ear. She comes to this office for an evaluation.  Past Medical History  Diagnosis Date  . Unspecified vitamin D deficiency 03/31/2010  . ANXIETY 07/01/2008  . DEPRESSION 07/01/2008  . REFLEX SYMPATHETIC DYSTROPHY 07/01/2008  . Trigeminal neuralgia 05/21/2009  . Other specified trigeminal nerve disorders 07/01/2008  . ALLERGIC RHINITIS 07/01/2008  . BRONCHITIS, CHRONIC 07/01/2008  . GERD 07/01/2008  . ECZEMA 05/29/2009  . OSTEOARTHRITIS 07/01/2008  . OCCIPITAL NEURALGIA 07/01/2008  . BACK PAIN, THORACIC REGION 03/04/2010  . FIBROMYALGIA 07/01/2008  . LEG PAIN, BILATERAL 07/01/2008  . FACIAL PAIN 12/25/2009  . CARPAL  TUNNEL SYNDROME, HX OF 07/01/2008  . DVT, HX OF 07/01/2008  . MIGRAINES, HX OF 07/01/2008  . CHRONIC OBSTRUCTIVE PULMONARY DISEASE, ACUTE EXACERBATION 08/07/2010  . Hyperlipidemia     Past Surgical History  Procedure Laterality Date  . Lavh      BSO  . Tonsillectomy  1957  . Right foot bone spur  1987  . Left knee surgery  1989  . Right knee surgery  1990  . Right soulder surgery  [place]  . Head craniectomy  1994    MICROVASULAR DECOMPRESSION  . Right foot surgery  1998    BAD CUT  . Right jaw surgery  2002  . Right thumb sugery  2004  . Carpal tunnel release  2009    RIGHT  . Occipital nerve stimulator insertion      Family History  Problem Relation Age of Onset  . Ovarian cancer Mother 38  . Hypertension Father   . Prostate cancer Father   . Heart attack Father   . Heart attack Maternal Grandmother   . Stroke Paternal Grandmother     Social history:  reports that she quit smoking about 22 months ago. Her smoking use included Cigarettes. She has a 60 pack-year smoking history. She has never used smokeless tobacco. She reports that she does not drink alcohol or use illicit drugs.    Allergies  Allergen Reactions  . Cefazolin     Ancef - in surgery, swelled up, turned blue, heart problems  . Baclofen     REACTION: confusion  . Carbamazepine  REACTION: confusion  . Ceclor [Cefaclor]     hives  . Fentanyl     REACTION: nausea and vomiting  . Gabapentin     REACTION: confusion  . Oxcarbazepine     REACTION: confusion    Medications:  Current Outpatient Prescriptions on File Prior to Visit  Medication Sig Dispense Refill  . ALPRAZolam (XANAX) 0.5 MG tablet Take 0.5 mg by mouth 3 (three) times daily as needed for anxiety.      . calcium carbonate (OS-CAL - DOSED IN MG OF ELEMENTAL CALCIUM) 1250 MG tablet Take 1 tablet by mouth daily.      . carboxymethylcellulose (REFRESH PLUS) 0.5 % SOLN Take 1 drop by mouth daily as needed. For dry eyes        . cetirizine (ZYRTEC) 10 MG tablet Take 10 mg by mouth daily.        . Cinnamon 500 MG capsule Take 1,000 mg by mouth daily.       . cycloSPORINE (RESTASIS) 0.05 % ophthalmic emulsion Place 1 drop into both eyes 2 (two) times daily.      . fluconazole (DIFLUCAN) 150 MG tablet Take 150 mg by mouth daily. For yeast. Completed medication over a month ago from today (12-10-13)      . frovatriptan (FROVA) 2.5 MG tablet Take 2.5 mg by mouth as needed. If recurs, may repeat after 2 hours. Max of 3 tabs in 24 hours. For migraines.      . furosemide (LASIX) 20 MG tablet Take 20 mg by mouth daily as needed for fluid. For swelling      . guaiFENesin (MUCINEX) 600 MG 12 hr tablet Take 1,200 mg by mouth 2 (two) times daily.      Marland Kitchen HYDROmorphone (DILAUDID) 4 MG tablet Take 4 mg by mouth as needed for severe pain.      . hydrOXYzine (ATARAX) 10 MG tablet Take 10 mg by mouth every 8 (eight) hours as needed. For dizziness per Renaldo Reel, MD      . lidocaine (LIDODERM) 5 % Place 1 patch onto the skin daily. Remove & Discard patch within 12 hours or as directed by Renaldo Reel MD      . meclizine (ANTIVERT) 25 MG tablet Take 1 tablet (25 mg total) by mouth 3 (three) times daily as needed for dizziness. PRN dizziness  30 tablet  0  . metaxalone (SKELAXIN) 800 MG tablet Take 800 mg by mouth 3 (three) times daily.        . metoclopramide (REGLAN) 10 MG tablet Take 10 mg by mouth 3 (three) times daily as needed. For nausea      . montelukast (SINGULAIR) 10 MG tablet Take 1 tablet (10 mg total) by mouth daily at 6 PM.  90 tablet  3  . Multiple Vitamin (MULTIVITAMIN) tablet Take 1 tablet by mouth every morning.       . Omega-3 Fatty Acids (FISH OIL) 1200 MG CAPS Take 1 capsule by mouth every evening.      . potassium chloride (K-DUR) 10 MEQ tablet Take 1 tablet (10 mEq total) by mouth daily as needed.  60 tablet  3  . RABEprazole (ACIPHEX) 20 MG tablet Take 1 tablet (20 mg total) by mouth every morning.  90 tablet  3  .  silodosin (RAPAFLO) 8 MG CAPS capsule Take 1 capsule (8 mg total) by mouth daily with breakfast.  90 capsule  2  . tiZANidine (ZANAFLEX) 2 MG tablet Take 2 mg by mouth at  bedtime.      . vitamin E 400 UNIT capsule Take 400 Units by mouth daily at 6 PM.       . fluticasone (FLONASE) 50 MCG/ACT nasal spray Place 2 sprays into the nose daily as needed. For nasal congestion  16 g  3   No current facility-administered medications on file prior to visit.    ROS:  Out of a complete 14 system review of symptoms, the patient complains only of the following symptoms, and all other reviewed systems are negative.  Fatigue Swelling in the right leg Hearing loss, ringing in the ear, dizziness, difficulty swallowing Eye pain Diarrhea, constipation Easy bruising, easy bleeding Feeling hot, increased thirst Joint pain, joint swelling, muscle cramps, achy muscles Allergies Memory loss, confusion, headache, numbness, weakness, difficulty swallowing, dizziness Insomnia  Blood pressure 130/76, pulse 85, height 5' 2.7" (1.593 m), weight 231 lb (104.781 kg), last menstrual period 07/26/1992.  Physical Exam  General: The patient is alert and cooperative at the time of the examination.The patient is moderately obese.  Skin: 2+ edema is noted below the knees bilaterally.   Neurologic Exam  Mental status: The patient is oriented x 3. Mini-Mental status examination done today shows a total score of 30/30.  Cranial nerves: Facial symmetry is present. Speech is normal, no aphasia or dysarthria is noted. Extraocular movements are full. Visual fields are full.  Motor: The patient has good strength in all 4 extremities to direct testing.  Sensory examination: Soft touch sensation is decreased on the right face, arm, and leg.  Coordination: The patient has good finger-nose-finger and heel-to-shin bilaterally.  Gait and station: The patient has a slightly wide-based gait. The patient is able to perform  tandem gait today. Romberg is negative. No drift is seen. The patient is walking with a walker today.  Reflexes: Deep tendon reflexes are symmetric, but are depressed.    CT head 12/10/13:  IMPRESSION:  Postsurgical changes seen in right cerebellar hemisphere. No acute  intracranial abnormality seen.     Assessment/Plan:  One. History of migraine headache  2. Right leg discomfort, weakness  3. New onset vertigo  The patient is having some symptoms of migraine associated with vertigo, which may be migraine associated. The patient is having her usual visual aura off to the right. The patient will be placed on a prednisone Dosepak for 6 days to see the symptoms can be improved. The patient is having some issues with the right leg, it is unclear at whether or not this is related to the migraine issue. The patient will followup in 4 to 6 months or sooner if needed.  Jill Alexanders MD 12/12/2013 7:59 PM  [facility] [address] [place], [place] [address]  Phone [phone] Fax [phone]

## 2013-12-12 NOTE — Patient Instructions (Signed)
Migraine Headache A migraine headache is an intense, throbbing pain on one or both sides of your head. A migraine can last for 30 minutes to several hours. CAUSES  The exact cause of a migraine headache is not always known. However, a migraine may be caused when nerves in the brain become irritated and release chemicals that cause inflammation. This causes pain. Certain things may also trigger migraines, such as:  Alcohol.  Smoking.  Stress.  Menstruation.  Aged cheeses.  Foods or drinks that contain nitrates, glutamate, aspartame, or tyramine.  Lack of sleep.  Chocolate.  Caffeine.  Hunger.  Physical exertion.  Fatigue.  Medicines used to treat chest pain (nitroglycerine), birth control pills, estrogen, and some blood pressure medicines. SIGNS AND SYMPTOMS  Pain on one or both sides of your head.  Pulsating or throbbing pain.  Severe pain that prevents daily activities.  Pain that is aggravated by any physical activity.  Nausea, vomiting, or both.  Dizziness.  Pain with exposure to bright lights, loud noises, or activity.  General sensitivity to bright lights, loud noises, or smells. Before you get a migraine, you may get warning signs that a migraine is coming (aura). An aura may include:  Seeing flashing lights.  Seeing bright spots, halos, or zig-zag lines.  Having tunnel vision or blurred vision.  Having feelings of numbness or tingling.  Having trouble talking.  Having muscle weakness. DIAGNOSIS  A migraine headache is often diagnosed based on:  Symptoms.  Physical exam.  A CT scan or MRI of your head. These imaging tests cannot diagnose migraines, but they can help rule out other causes of headaches. TREATMENT Medicines may be given for pain and nausea. Medicines can also be given to help prevent recurrent migraines.  HOME CARE INSTRUCTIONS  Only take over-the-counter or prescription medicines for pain or discomfort as directed by your  health care provider. The use of long-term narcotics is not recommended.  Lie down in a dark, quiet room when you have a migraine.  Keep a journal to find out what may trigger your migraine headaches. For example, write down:  What you eat and drink.  How much sleep you get.  Any change to your diet or medicines.  Limit alcohol consumption.  Quit smoking if you smoke.  Get 7 9 hours of sleep, or as recommended by your health care provider.  Limit stress.  Keep lights dim if bright lights bother you and make your migraines worse. SEEK IMMEDIATE MEDICAL CARE IF:   Your migraine becomes severe.  You have a fever.  You have a stiff neck.  You have vision loss.  You have muscular weakness or loss of muscle control.  You start losing your balance or have trouble walking.  You feel faint or pass out.  You have severe symptoms that are different from your first symptoms. MAKE SURE YOU:   Understand these instructions.  Will watch your condition.  Will get help right away if you are not doing well or get worse. Document Released: 07/12/2005 Document Revised: 05/02/2013 Document Reviewed: 03/19/2013 [employer] Patient Information 2014 [employer].  

## 2013-12-18 ENCOUNTER — Telehealth: Payer: Self-pay | Admitting: Neurology

## 2013-12-18 MED ORDER — PROPRANOLOL HCL 10 MG PO TABS
ORAL_TABLET | ORAL | Status: DC
Start: 2013-12-18 — End: 2014-01-18

## 2013-12-18 MED ORDER — MECLIZINE HCL 25 MG PO TABS: 25.0000 mg | ORAL_TABLET | Freq: Four times a day (QID) | ORAL | Status: DC

## 2013-12-18 NOTE — Telephone Encounter (Signed)
Patient calling to inform Dr. Willis, that she has completed predniSONE (DELTASONE) 5 MG tablet and still having same problems. In addition to R arm and lower arm weakness, and also she feels she has a lump in her throat. Please call and advise.   

## 2013-12-18 NOTE — Telephone Encounter (Signed)
Patient requesting a refill of  meclizine (ANTIVERT) 25 MG tablet and wanted to know if it could be increased to 4x a day.  Still very dizzy.  Please call and advise.  Thanks

## 2013-12-18 NOTE — Telephone Encounter (Signed)
Patient says she is very dizzy.  She was prescribed Meclizine 25mg  three times daily as needed by ED on 05/18.  She would like to know if we will refill this med, but also wants a dose increase to four times daily.  Please advise.  Thank you.

## 2013-12-18 NOTE — Telephone Encounter (Signed)
I have called patient earlier today, please refer to the previous telephone note.

## 2013-12-18 NOTE — Telephone Encounter (Signed)
I called patient. The patient is still having her visual aura, and the dizziness persists. The meclozine helped some, we can go to 25 mg 4 times daily. I will call in propranolol for her, and she indicates that the prednisone offered no benefit. The patient will be seen by Dr. Humphrey Rolls, and she will get injections of the head and neck for the headache. The visual aura involves both sides of the visual field at this point.

## 2013-12-18 NOTE — Telephone Encounter (Signed)
Patient calling to inform Dr. Jannifer Franklin, that she has completed predniSONE (DELTASONE) 5 MG tablet and still having same problems. In addition to R arm and lower arm weakness, and also she feels she has a lump in her throat. Please call and advise.

## 2013-12-19 ENCOUNTER — Ambulatory Visit: Payer: Medicare Other | Admitting: Neurology

## 2013-12-20 DIAGNOSIS — G43119 Migraine with aura, intractable, without status migrainosus: Secondary | ICD-10-CM | POA: Diagnosis not present

## 2013-12-20 DIAGNOSIS — IMO0002 Reserved for concepts with insufficient information to code with codable children: Secondary | ICD-10-CM | POA: Diagnosis not present

## 2013-12-20 DIAGNOSIS — IMO0001 Reserved for inherently not codable concepts without codable children: Secondary | ICD-10-CM | POA: Diagnosis not present

## 2013-12-20 DIAGNOSIS — G43719 Chronic migraine without aura, intractable, without status migrainosus: Secondary | ICD-10-CM | POA: Diagnosis not present

## 2013-12-26 ENCOUNTER — Other Ambulatory Visit: Payer: Self-pay | Admitting: Neurology

## 2013-12-26 DIAGNOSIS — M549 Dorsalgia, unspecified: Secondary | ICD-10-CM

## 2013-12-26 DIAGNOSIS — R29898 Other symptoms and signs involving the musculoskeletal system: Secondary | ICD-10-CM

## 2013-12-26 DIAGNOSIS — R0989 Other specified symptoms and signs involving the circulatory and respiratory systems: Secondary | ICD-10-CM

## 2013-12-26 DIAGNOSIS — M542 Cervicalgia: Secondary | ICD-10-CM

## 2013-12-26 DIAGNOSIS — R609 Edema, unspecified: Secondary | ICD-10-CM

## 2013-12-27 ENCOUNTER — Ambulatory Visit (INDEPENDENT_AMBULATORY_CARE_PROVIDER_SITE_OTHER): Payer: Medicare Other | Admitting: Family Medicine

## 2013-12-27 ENCOUNTER — Encounter: Payer: Self-pay | Admitting: Family Medicine

## 2013-12-27 VITALS — BP 130/80 | HR 91 | Temp 97.5°F | Wt 232.0 lb

## 2013-12-27 DIAGNOSIS — R5383 Other fatigue: Secondary | ICD-10-CM | POA: Diagnosis not present

## 2013-12-27 DIAGNOSIS — Z87898 Personal history of other specified conditions: Secondary | ICD-10-CM

## 2013-12-27 DIAGNOSIS — R29898 Other symptoms and signs involving the musculoskeletal system: Secondary | ICD-10-CM | POA: Diagnosis not present

## 2013-12-27 DIAGNOSIS — R600 Localized edema: Secondary | ICD-10-CM

## 2013-12-27 DIAGNOSIS — R609 Edema, unspecified: Secondary | ICD-10-CM | POA: Diagnosis not present

## 2013-12-27 DIAGNOSIS — R531 Weakness: Secondary | ICD-10-CM

## 2013-12-27 DIAGNOSIS — R5381 Other malaise: Secondary | ICD-10-CM | POA: Diagnosis not present

## 2013-12-27 NOTE — Progress Notes (Signed)
Pre visit review using our clinic review tool, if applicable. No additional management support is needed unless otherwise documented below in the visit note. 

## 2013-12-27 NOTE — Progress Notes (Signed)
Subjective:    Patient ID: Ashley Castaneda, female    DOB: 29-Jan-1951, 63 y.o.   MRN: [id number]  HPI Patient is seen from followup recent emergency department visit. She's had multiple visits recently with multiple specialists (including 2 neurologists). She presented here back in early May with asymmetric leg edema. Venous Dopplers revealed no evidence for clot. She subsequently presented to urgent care Center with bilateral leg weakness. She had CT head which showed no acute findings.  She has been seen by neurologist who prescribed prednisone and propranolol for persistent migraine headaches. She's had some intermittent flashes of light and has history of chronic headaches. She's also had vertigo symptoms off and on treated with Antivert. She's has seen a headache specialist in another city who had seen her last week and who ordered a CT angiogram, CT cervical, thoracic, and lumbar spine. She cannot have MRI scans because of deep brain stimulator.  She has nonspecific weakness lower extremities. She has chronic loss of knee reflexes bilaterally but has had some preserved ankle reflexes. She's not a progressive weakness to suggest Guillain-Barr syndrome. No fevers or chills. Patient reportedly had EMG/nerve conduction velocities within the past year which did not show any focal weakness lower extremities.  No loss of bladder or bowel control.  Past Medical History  Diagnosis Date  . Unspecified vitamin D deficiency 03/31/2010  . ANXIETY 07/01/2008  . DEPRESSION 07/01/2008  . REFLEX SYMPATHETIC DYSTROPHY 07/01/2008  . Trigeminal neuralgia 05/21/2009  . Other specified trigeminal nerve disorders 07/01/2008  . ALLERGIC RHINITIS 07/01/2008  . BRONCHITIS, CHRONIC 07/01/2008  . GERD 07/01/2008  . ECZEMA 05/29/2009  . OSTEOARTHRITIS 07/01/2008  . OCCIPITAL NEURALGIA 07/01/2008  . BACK PAIN, THORACIC REGION 03/04/2010  . FIBROMYALGIA 07/01/2008  . LEG PAIN, BILATERAL 07/01/2008  . FACIAL PAIN 12/25/2009  .  CARPAL TUNNEL SYNDROME, HX OF 07/01/2008  . DVT, HX OF 07/01/2008  . MIGRAINES, HX OF 07/01/2008  . CHRONIC OBSTRUCTIVE PULMONARY DISEASE, ACUTE EXACERBATION 08/07/2010  . Hyperlipidemia    Past Surgical History  Procedure Laterality Date  . [place]      BSO  . Tonsillectomy  1957  . Right foot bone spur  1987  . Left knee surgery  1989  . Right knee surgery  1990  . Right [place]oulder surgery  [place]  . Head craniectomy  1994    MICROVASULAR DECOMPRESSION  . Right foot surgery  1998    BAD CUT  . Right jaw surgery  2002  . Right thumb sugery  2004  . Carpal tunnel release  2009    RIGHT  . Occipital nerve stimulator insertion      reports that she quit smoking about 22 months ago. Her smoking use included Cigarettes. She has a 60 pack-year smoking history. She has never used smokeless tobacco. She reports that she does not drink alcohol or use illicit drugs. family history includes Heart attack in her father and maternal grandmother; Hypertension in her father; Ovarian cancer (age of onset: 58) in her mother; Prostate cancer in her father; Stroke in her paternal grandmother. Allergies  Allergen Reactions  . Cefazolin     Ancef - in surgery, swelled up, turned blue, heart problems  . Baclofen     REACTION: confusion  . Carbamazepine     REACTION: confusion  . Ceclor [Cefaclor]     hives  . Fentanyl     REACTION: nausea and vomiting  . Gabapentin     REACTION: confusion  .  Oxcarbazepine     REACTION: confusion      Review of Systems  Constitutional: Positive for fatigue. Negative for fever, chills and unexpected weight change.  HENT: Negative for trouble swallowing.   Respiratory: Negative for shortness of breath.   Cardiovascular: Negative for chest pain.  Endocrine: Negative for polydipsia and polyuria.  Genitourinary: Negative for dysuria.  Neurological: Positive for dizziness, weakness, numbness and headaches.  Psychiatric/Behavioral: Negative for  confusion.       Objective:   Physical Exam  Constitutional: She appears well-developed and well-nourished.  Cardiovascular: Normal rate.   Pulmonary/Chest: Breath sounds normal. No respiratory distress. She has no wheezes. She has no rales.  Musculoskeletal:  No pitting edema in her legs. No calf tenderness. Both feet are warm to touch. 1-2+ dorsalis pedis pulses bilaterally with excellent capillary refill          Assessment & Plan:  #1 bilateral leg weakness. She does not have any focal weakness. She does have some loss of reflexes knee bilaterally and this is chronic. She has some preservation of ankle reflexes bilaterally. Doubt vascular. No clinical evidence for arterial compromise. Recent venous Dopplers unremarkable.  No progressive weakness to suggest Guillain Barre.  She has CT arteriogram of abdomen,CT lumbar/thoracic/cervical ordered.  She cannot get MRI with brain stimulator. Need to consider etiologies such as transverse myelitis.  She is considering getting brain stimulator removed and will be discussing with her neurologist.  If she does, we could consider MRI lumbar spine.  #2 Bilateral leg edema. Suspect venous stasis related. Intolerant of compression stockings in the past.  Stable.  Has not tolerated diuretics well recently secondary to weakness.  #3 generalized weakness. Unclear etiology. Question anxiety component.  Multiple recent labs unremarkable. #4 migraine headaches. She is encouraged to go ahead and start propranolol 10 mg twice a day and also finish out her prednisone taper

## 2013-12-27 NOTE — Patient Instructions (Signed)
Go ahead and start Propranolol as per Dr Jannifer Franklin' recommendations Go ahead with the CT scans as ordered per Dr Chancy Milroy.

## 2013-12-31 ENCOUNTER — Telehealth: Payer: Self-pay | Admitting: Neurology

## 2013-12-31 NOTE — Telephone Encounter (Signed)
Patient calling to update Dr. Jannifer Franklin - she states that she is very weak and not any better. She had injections in her scalp for her headaches and those did not help much. She has been on prednisone which she will finish tomorrow. Dr. Chancy Milroy ordered CT scans to be done this Wednesday at [facility]. She is requesting a call back from Dr. Jannifer Franklin.

## 2014-01-01 ENCOUNTER — Ambulatory Visit: Payer: Self-pay

## 2014-01-01 NOTE — Telephone Encounter (Signed)
Patient coming in tomorrow at 1130 for Depacon infusion.

## 2014-01-01 NOTE — Telephone Encounter (Signed)
Patient would like for you to call her back to discuss her symptoms.

## 2014-01-01 NOTE — Telephone Encounter (Signed)
I called patient. The patient is having ongoing symptoms of dizziness, right homonymous visual field bright flashing lights. The patient is reporting ongoing headache. She also now reports some problems with weakness in both legs. The patient will be undergoing a CT scan of the neck, thoracic spine, and lumbar spine and head an angiographic evaluation of the aorta. This is ordered by Dr. Chancy Milroy, who is her headache doctor. The patient will go on propranolol now, and I'll get her set up for a Depacon injections. She just came off of a 10 mg prednisone tapering course. This offered no benefit.

## 2014-01-02 ENCOUNTER — Ambulatory Visit (INDEPENDENT_AMBULATORY_CARE_PROVIDER_SITE_OTHER): Payer: Medicare Other | Admitting: Neurology

## 2014-01-02 ENCOUNTER — Ambulatory Visit
Admission: RE | Admit: 2014-01-02 | Discharge: 2014-01-02 | Disposition: A | Discharge status: Home / Self Care | Payer: Medicare Other | Source: Ambulatory Visit | Attending: Neurology | Admitting: Neurology

## 2014-01-02 VITALS — BP 108/68 | HR 79 | Temp 96.9°F

## 2014-01-02 DIAGNOSIS — R29898 Other symptoms and signs involving the musculoskeletal system: Secondary | ICD-10-CM

## 2014-01-02 DIAGNOSIS — IMO0001 Reserved for inherently not codable concepts without codable children: Secondary | ICD-10-CM

## 2014-01-02 DIAGNOSIS — R0989 Other specified symptoms and signs involving the circulatory and respiratory systems: Secondary | ICD-10-CM

## 2014-01-02 DIAGNOSIS — M5126 Other intervertebral disc displacement, lumbar region: Secondary | ICD-10-CM | POA: Diagnosis not present

## 2014-01-02 DIAGNOSIS — I708 Atherosclerosis of other arteries: Secondary | ICD-10-CM | POA: Diagnosis not present

## 2014-01-02 DIAGNOSIS — M47814 Spondylosis without myelopathy or radiculopathy, thoracic region: Secondary | ICD-10-CM | POA: Diagnosis not present

## 2014-01-02 DIAGNOSIS — M48061 Spinal stenosis, lumbar region without neurogenic claudication: Secondary | ICD-10-CM | POA: Diagnosis not present

## 2014-01-02 DIAGNOSIS — R609 Edema, unspecified: Secondary | ICD-10-CM

## 2014-01-02 DIAGNOSIS — M549 Dorsalgia, unspecified: Secondary | ICD-10-CM

## 2014-01-02 DIAGNOSIS — M47817 Spondylosis without myelopathy or radiculopathy, lumbosacral region: Secondary | ICD-10-CM | POA: Diagnosis not present

## 2014-01-02 MED ORDER — VALPROATE SODIUM 500 MG/5ML IV SOLN
1500.0000 mg | INTRAVENOUS | Status: DC
  Administered 2014-01-02: 1500 mg via INTRAVENOUS

## 2014-01-02 MED ORDER — IOHEXOL 350 MG/ML SOLN
150.0000 mL | Freq: Once | INTRAVENOUS | Status: AC | PRN
  Administered 2014-01-02: 150 mL via INTRAVENOUS

## 2014-01-02 NOTE — Progress Notes (Signed)
Patient here for Depacon infusion.  Order for Depacon 1000mg  IV may give an additional 500mg .  Patient to treatment room using a walker.  Says she sees a "light show" when moving.  Headache pain level 10.  IV attempted in right anterior forearm unsuccessful, 2nd attempt in left anterior FA successful, good blood return, 24g angiocath.  Depacon 1000mg /100cc NaCl started at 1210.  Patient very talkative throughout infusion.  When infusion complete no change in pain level.  Depacon 500mg /100ccNaCl started at 1227, upon completion no change in pain level.   IV discontinued and saline locked, to be used for CT scans at [place] this afternoon.  Patient to checkout in NAD, relayed that "light show" not as bad.

## 2014-01-02 NOTE — Patient Instructions (Signed)
Patient had CT's scheduled today at [facility].

## 2014-01-07 ENCOUNTER — Telehealth: Payer: Self-pay | Admitting: Neurology

## 2014-01-07 NOTE — Telephone Encounter (Signed)
I have already seen the reports for the studies. The patient did not have an MRI, she had CT scan studies of the spine, and a CT angiogram. I did not call the patient with the results of the studies as I was not the doctor that ordered them.

## 2014-01-07 NOTE — Telephone Encounter (Signed)
Spoke with [facility] and the patient wanted Dr. Jannifer Franklin to have copy of MRI's that Dr. Chancy Milroy ordered.  I explained that the doctor can see them in Epic and I will relay they are completed.

## 2014-01-10 DIAGNOSIS — Z789 Other specified health status: Secondary | ICD-10-CM | POA: Diagnosis not present

## 2014-01-10 DIAGNOSIS — G894 Chronic pain syndrome: Secondary | ICD-10-CM | POA: Diagnosis not present

## 2014-01-10 DIAGNOSIS — G44049 Chronic paroxysmal hemicrania, not intractable: Secondary | ICD-10-CM | POA: Diagnosis not present

## 2014-01-10 DIAGNOSIS — G43709 Chronic migraine without aura, not intractable, without status migrainosus: Secondary | ICD-10-CM | POA: Diagnosis not present

## 2014-01-14 ENCOUNTER — Telehealth: Payer: Self-pay | Admitting: Neurology

## 2014-01-14 NOTE — Telephone Encounter (Signed)
Patient requesting an earlier appointment with Dr. Jannifer Franklin - Please call to advise

## 2014-01-18 ENCOUNTER — Encounter: Payer: Self-pay | Admitting: Adult Health

## 2014-01-18 ENCOUNTER — Ambulatory Visit (INDEPENDENT_AMBULATORY_CARE_PROVIDER_SITE_OTHER): Payer: Medicare Other | Admitting: Adult Health

## 2014-01-18 VITALS — BP 120/69 | HR 95 | Ht 63.5 in | Wt 232.0 lb

## 2014-01-18 DIAGNOSIS — R202 Paresthesia of skin: Principal | ICD-10-CM

## 2014-01-18 DIAGNOSIS — D509 Iron deficiency anemia, unspecified: Secondary | ICD-10-CM | POA: Diagnosis not present

## 2014-01-18 DIAGNOSIS — G500 Trigeminal neuralgia: Secondary | ICD-10-CM

## 2014-01-18 DIAGNOSIS — G43909 Migraine, unspecified, not intractable, without status migrainosus: Secondary | ICD-10-CM | POA: Diagnosis not present

## 2014-01-18 DIAGNOSIS — R209 Unspecified disturbances of skin sensation: Secondary | ICD-10-CM | POA: Diagnosis not present

## 2014-01-18 DIAGNOSIS — R42 Dizziness and giddiness: Secondary | ICD-10-CM | POA: Insufficient documentation

## 2014-01-18 DIAGNOSIS — R200 Anesthesia of skin: Secondary | ICD-10-CM | POA: Insufficient documentation

## 2014-01-18 MED ORDER — MECLIZINE HCL 25 MG PO TABS: 25.0000 mg | ORAL_TABLET | Freq: Four times a day (QID) | ORAL | Status: DC

## 2014-01-18 NOTE — Patient Instructions (Signed)
Once the stimulator is removed, we would like to obtain an MRI of your brain. Please contact us when the surgery is completed.

## 2014-01-18 NOTE — Progress Notes (Signed)
PATIENT: Ashley Castaneda DOB: [birth year]  REASON FOR VISIT: follow up HISTORY FROM: patient  HISTORY OF PRESENT ILLNESS: Ashley Castaneda is a 63 year old female with a history of migraines, fibromyalgia, and trigeminal neuralgia. She returns today for follow-up. At the previous visit the patient was having dizziness that was thought to be related to migraines. She was placed on a prednisone dosepak but reported that it was not beneficial. She was then started on propranolol and her meclizine was increased to QID. Patient's headache specialist is Dr. Chancy Milroy, he ordered scalp injections for her headaches, which did not help. The patient was then set up for Depacon injection by Dr. Jannifer Franklin but reports that it was not beneficial. Patient states that she has a headache 24 hour a day and at times it is worse. Patient states that she sees flashing lights in the right peripheral visual field and since may 18th she started seeing the lights on the left but not as often as the right. Patient has a stimulator placed for trigeminal neuralgia and states that it worked great at first but it currently is not working and will have to be removed. She is currently waiting to hear back from the MD to see when she can have it removed. She wants to be able to get a MRI to see why she continues to have headaches and dizziness. Patient states that all day long she is dizzy. She has had dizziness in the past when she would have a migraine but she states this is much different and more consistent. She currently is using a walker because her balance is off due to the dizziness. Patient states that she feels numbness and burning in the bottom of the feet that occurs all day long. This has been a chronic issue but patient reports that it has been much worse in the last 6 weeks. Denies it being worse at any particular time. The patient also reports numbness in both hands. Patient had  nerve conduction studies on her arms that were  unremarkable. She has been on Cymbalta, gabapentin and lyric but was unable to tolerate the medication. Patient also complains of difficulty swallowing that started a week ago. She feels that something is stuck in her throat. She returns today for an evaluation.  REVIEW OF SYSTEMS: Full 14 system review of systems performed and notable only for:  Constitutional: Chills, fatigue  Eyes: Light sensitivity, eye pain Ear/Nose/Throat: Hearing loss, ringing in the ears, trouble swallowing  Skin: N/A  Cardiovascular: Leg swelling  Respiratory: N/A  Gastrointestinal: Constipation, diarrhea, nausea  Genitourinary: N/A Hematology/Lymphatic: Bruise/bleed easily Endocrine: Cold intolerance Musculoskeletal: Joint pain, joint swelling, back pain, aching muscles, muscle cramps, walking difficulty, neck pain, neck stiffness  Allergy/Immunology: Her mental allergies Neurological: Memory loss, dizziness, headache, numbness, weakness Psychiatric: N/A Sleep: N/A   ALLERGIES: Allergies  Allergen Reactions  . Cefazolin     Ancef - in surgery, swelled up, turned blue, heart problems  . Baclofen     REACTION: confusion  . Carbamazepine     REACTION: confusion  . Ceclor [Cefaclor]     hives  . Fentanyl     REACTION: nausea and vomiting  . Gabapentin     REACTION: confusion  . Oxcarbazepine     REACTION: confusion    HOME MEDICATIONS: Outpatient Prescriptions Prior to Visit  Medication Sig Dispense Refill  . ALPRAZolam (XANAX) 0.5 MG tablet Take 0.5 mg by mouth 3 (three) times daily as needed for anxiety.      Marland Kitchen  calcium carbonate (OS-CAL - DOSED IN MG OF ELEMENTAL CALCIUM) 1250 MG tablet Take 1 tablet by mouth daily.      . carboxymethylcellulose (REFRESH PLUS) 0.5 % SOLN Take 1 drop by mouth daily as needed. For dry eyes      . cetirizine (ZYRTEC) 10 MG tablet Take 10 mg by mouth daily.        . Cinnamon 500 MG capsule Take 1,000 mg by mouth daily.       . cycloSPORINE (RESTASIS) 0.05 %  ophthalmic emulsion Place 1 drop into both eyes 2 (two) times daily.      . fluconazole (DIFLUCAN) 150 MG tablet Take 150 mg by mouth daily. For yeast. Completed medication over a month ago from today (12-10-13)      . frovatriptan (FROVA) 2.5 MG tablet Take 2.5 mg by mouth as needed. If recurs, may repeat after 2 hours. Max of 3 tabs in 24 hours. For migraines.      . furosemide (LASIX) 20 MG tablet Take 20 mg by mouth daily as needed for fluid. For swelling      . guaiFENesin (MUCINEX) 600 MG 12 hr tablet Take 1,200 mg by mouth 2 (two) times daily.      Marland Kitchen HYDROmorphone (DILAUDID) 4 MG tablet Take 4 mg by mouth as needed for severe pain.      . hydrOXYzine (ATARAX) 10 MG tablet Take 10 mg by mouth every 8 (eight) hours as needed. For dizziness per Renaldo Reel, MD      . lidocaine (LIDODERM) 5 % Place 1 patch onto the skin daily. Remove & Discard patch within 12 hours or as directed by Renaldo Reel MD      . meclizine (ANTIVERT) 25 MG tablet Take 1 tablet (25 mg total) by mouth 4 (four) times daily. PRN dizziness  120 tablet  1  . metaxalone (SKELAXIN) 800 MG tablet Take 800 mg by mouth 3 (three) times daily.        . metoclopramide (REGLAN) 10 MG tablet Take 10 mg by mouth 3 (three) times daily as needed. For nausea      . montelukast (SINGULAIR) 10 MG tablet Take 1 tablet (10 mg total) by mouth daily at 6 PM.  90 tablet  3  . Multiple Vitamin (MULTIVITAMIN) tablet Take 1 tablet by mouth every morning.       . Omega-3 Fatty Acids (FISH OIL) 1200 MG CAPS Take 1 capsule by mouth every evening.      . potassium chloride (K-DUR) 10 MEQ tablet Take 1 tablet (10 mEq total) by mouth daily as needed.  60 tablet  3  . predniSONE (DELTASONE) 10 MG tablet Taper: 6-5-4-3-2-1      . RABEprazole (ACIPHEX) 20 MG tablet Take 1 tablet (20 mg total) by mouth every morning.  90 tablet  3  . silodosin (RAPAFLO) 8 MG CAPS capsule Take 1 capsule (8 mg total) by mouth daily with breakfast.  90 capsule  2  . tiZANidine  (ZANAFLEX) 2 MG tablet Take 2 mg by mouth at bedtime.      . vitamin E 400 UNIT capsule Take 400 Units by mouth daily at 6 PM.       . fluticasone (FLONASE) 50 MCG/ACT nasal spray Place 2 sprays into the nose daily as needed. For nasal congestion  16 g  3  . propranolol (INDERAL) 10 MG tablet 1 tablet twice daily for one week, then take 2 tablets twice daily  120 tablet  1  Facility-Administered Medications Prior to Visit  Medication Dose Route Frequency Provider Last Rate Last Dose  . valproate (DEPACON) 1,500 mg in sodium chloride 0.9 % 100 mL IVPB  1,500 mg Intravenous Continuous Kathrynn Ducking, MD 115 mL/hr at 01/02/14 1215 1,500 mg at 01/02/14 1215    PAST MEDICAL HISTORY: Past Medical History  Diagnosis Date  . Unspecified vitamin D deficiency 03/31/2010  . ANXIETY 07/01/2008  . DEPRESSION 07/01/2008  . REFLEX SYMPATHETIC DYSTROPHY 07/01/2008  . Trigeminal neuralgia 05/21/2009  . Other specified trigeminal nerve disorders 07/01/2008  . ALLERGIC RHINITIS 07/01/2008  . BRONCHITIS, CHRONIC 07/01/2008  . GERD 07/01/2008  . ECZEMA 05/29/2009  . OSTEOARTHRITIS 07/01/2008  . OCCIPITAL NEURALGIA 07/01/2008  . BACK PAIN, THORACIC REGION 03/04/2010  . FIBROMYALGIA 07/01/2008  . LEG PAIN, BILATERAL 07/01/2008  . FACIAL PAIN 12/25/2009  . CARPAL TUNNEL SYNDROME, HX OF 07/01/2008  . DVT, HX OF 07/01/2008  . MIGRAINES, HX OF 07/01/2008  . CHRONIC OBSTRUCTIVE PULMONARY DISEASE, ACUTE EXACERBATION 08/07/2010  . Hyperlipidemia     PAST SURGICAL HISTORY: Past Surgical History  Procedure Laterality Date  . Lavh      BSO  . Tonsillectomy  1957  . Right foot bone spur  1987  . Left knee surgery  1989  . Right knee surgery  1990  . Right soulder surgery  [place]  . Head craniectomy  1994    MICROVASULAR DECOMPRESSION  . Right foot surgery  1998    BAD CUT  . Right jaw surgery  2002  . Right thumb sugery  2004  . Carpal tunnel release  2009    RIGHT  . Occipital nerve stimulator  insertion      FAMILY HISTORY: Family History  Problem Relation Age of Onset  . Ovarian cancer Mother 38  . Hypertension Father   . Prostate cancer Father   . Heart attack Father   . Heart attack Maternal Grandmother   . Stroke Paternal Grandmother     SOCIAL HISTORY: History   Social History  . Marital Status: Married    Spouse Name: [name]    Number of Children: 0  . Years of Education: college-3   Occupational History  . disabled    Social History Main Topics  . Smoking status: Former Smoker -- 2.00 packs/day for 30 years    Types: Cigarettes    Quit date: 02/04/2012  . Smokeless tobacco: Never Used  . Alcohol Use: No     Comment: quit 2000  . Drug Use: No  . Sexual Activity: No   Other Topics Concern  . Not on file   Social History Narrative   Patient lives at home with husband [name].    Patient has no children and 2 step children.    Patient is on disability.    Patient has 3 years of college.    Patient is right handed.       PHYSICAL EXAM  Filed Vitals:   01/18/14 1334  BP: 120/69  Pulse: 95  Height: 5' 3.5" (1.613 m)  Weight: 232 lb (105.235 kg)   Body mass index is 40.45 kg/(m^2).  Generalized: Well developed, in no acute distress   Neurological examination  Mentation: Alert oriented to time, place, history taking. Follows all commands speech and language fluent Cranial nerve II-XII:  Extraocular movements were full, visual field were full on confrontational test. Nystagmus horizontally and vertically. Patient's eyes tend to jump even when she is centrally focused.  Motor: The  motor testing reveals 5 over 5 strength in upper extremities and 4/5 in lower extremities. Good symmetric motor tone is noted throughout.  Sensory: Sensory testing is intact to soft touch on all 4 extremities. No evidence of extinction is noted.  Coordination: Cerebellar testing reveals good finger-nose-finger and heel-to-shin bilaterally.  Gait and station: Gait is  slow, slightly wide based and unsteady without her walker. Tandem gait unsteady. Romberg is negative. No drift is seen.  Reflexes: Deep tendon reflexes are symmetric and normal bilaterally.    DIAGNOSTIC DATA (LABS, IMAGING, TESTING) - I reviewed patient records, labs, notes, testing and imaging myself where available.  Lab Results  Component Value Date   WBC 7.4 11/29/2013   HGB 13.9 12/09/2013   HCT 41.0 12/09/2013   MCV 92.1 11/29/2013   PLT 340.0 11/29/2013      Component Value Date/Time   NA 140 12/09/2013 1840   K 4.0 12/09/2013 1840   CL 103 12/09/2013 1840   CO2 31 11/29/2013 1209   GLUCOSE 96 12/09/2013 1840   BUN 15 12/09/2013 1840   CREATININE 0.90 12/09/2013 1840   CALCIUM 9.4 11/29/2013 1209   PROT 7.3 06/14/2013 1107   ALBUMIN 4.0 06/14/2013 1107   AST 20 06/14/2013 1107   ALT 17 06/14/2013 1107   ALKPHOS 98 06/14/2013 1107   BILITOT 0.4 06/14/2013 1107   GFRNONAA >90 10/23/2011 2046   GFRAA >90 10/23/2011 2046   Lab Results  Component Value Date   CHOL 221* 06/14/2013   HDL 55.00 06/14/2013   LDLDIRECT 149.8 06/14/2013   TRIG 156.0* 06/14/2013   CHOLHDL 4 06/14/2013    Lab Results  Component Value Date   TSH 3.530 12/09/2013      ASSESSMENT AND PLAN 63 y.o. year old female  has a past medical history of Unspecified vitamin D deficiency (03/31/2010); ANXIETY (07/01/2008); DEPRESSION (07/01/2008); REFLEX SYMPATHETIC DYSTROPHY (07/01/2008); Trigeminal neuralgia (05/21/2009); Other specified trigeminal nerve disorders (07/01/2008); ALLERGIC RHINITIS (07/01/2008); BRONCHITIS, CHRONIC (07/01/2008); GERD (07/01/2008); ECZEMA (05/29/2009); OSTEOARTHRITIS (07/01/2008); OCCIPITAL NEURALGIA (07/01/2008); BACK PAIN, THORACIC REGION (03/04/2010); FIBROMYALGIA (07/01/2008); LEG PAIN, BILATERAL (07/01/2008); FACIAL PAIN (12/25/2009); CARPAL TUNNEL SYNDROME, HX OF (07/01/2008); DVT, HX OF (07/01/2008); MIGRAINES, HX OF (07/01/2008); CHRONIC OBSTRUCTIVE PULMONARY DISEASE, ACUTE EXACERBATION (08/07/2010); and  Hyperlipidemia. here with:   1. Numbness and tingling in both legs 2. Gait abnormality  2. Dizziness 3. Trigeminal neuralgia 4. Migraine  Patient is now having increased numbness and burning on the bottom of her feet that has affect her gait and balance. We will check nerve conduction studies with EMG of both legs as well as blood work. The patient has had a CT of lumbar and thoracic spine that was unremarkable. She confirms that she had a CT of the cervical spine that was also unremarkable however this report is not  in Epic. The patient continues to have ongoing dizziness, once the patient has the stimulator removed we will check a MRI of the brain. The patient has had a CT of the head and a CT angiogram that were unremarkable. She is followed by Dr. Humphrey Rolls for her migraines. Patient also complaining of swallowing difficulties. Stating that she feels something is stuck in her throat. I advised her to followup with her primary care provider regarding this. The patient should followup with [place] in 3 months or sooner if needed.   Ward Givens, MSN, NP-C 01/18/2014, 1:37 PM [facility] [address], [place], [place] [address] [phone]  Note: This document was prepared with  digital dictation and possible smart Company secretary. Any transcriptional errors that result from this process are unintentional.

## 2014-01-22 ENCOUNTER — Telehealth: Payer: Self-pay | Admitting: Adult Health

## 2014-01-22 LAB — IRON AND TIBC
IRON SATURATION: 34 % (ref 15–55)
Iron: 133 ug/dL (ref 35–155)
TIBC: 391 ug/dL (ref 250–450)
UIBC: 258 ug/dL (ref 150–375)

## 2014-01-22 LAB — ANA: ANA: NEGATIVE

## 2014-01-22 LAB — VITAMIN B12: Vitamin B-12: 1040 pg/mL — ABNORMAL HIGH (ref 211–946)

## 2014-01-22 LAB — COPPER, SERUM: Copper: 199 ug/dL — ABNORMAL HIGH (ref 72–166)

## 2014-01-22 LAB — SEDIMENTATION RATE: SED RATE: 28 mm/h (ref 0–40)

## 2014-01-22 LAB — RHEUMATOID FACTOR

## 2014-01-22 NOTE — Progress Notes (Signed)
I have read the note, and I agree with the clinical assessment and plan.  WILLIS,CHARLES KEITH   

## 2014-01-22 NOTE — Telephone Encounter (Signed)
I called the patient regarding lab results. Patient was not available I left a message on her machine for her to call the office back. Patient had a elevated B12 and copper levels. This is not clinically significant.

## 2014-01-24 ENCOUNTER — Telehealth: Payer: Self-pay | Admitting: Adult Health

## 2014-01-24 ENCOUNTER — Telehealth: Payer: Self-pay | Admitting: Family Medicine

## 2014-01-24 MED ORDER — MECLIZINE HCL 25 MG PO TABS: 25.0000 mg | ORAL_TABLET | Freq: Four times a day (QID) | ORAL | Status: DC

## 2014-01-24 NOTE — Telephone Encounter (Signed)
Patient call back regarding lab work. I explained that lab work was normal. Her vitamin B12 and copper level was elevated but this was not clinically significant. The patient continues to have ongoing dizziness. She requests that I send a meclizine prescription to [facility] of  [facility]. I will reorder this. Patient describes her dizziness as floating sensation. Denies the room spinning. The patient is having her stimulator for trigeminal neuralgia removed this month. After that we can obtain an MRI of her brain. CT and CTA of the brain was unremarkable. I advised the patient that she should also make her primary care physician aware of her ongoing dizziness as her dizziness could be from a cardiac origin. Patient also sees Dr. Chancy Milroy for her headaches.

## 2014-01-24 NOTE — Telephone Encounter (Signed)
Pt called said her condition is the same as it was the last time she saw him still weak,legs and arms weak and numb feeling,very dizzy

## 2014-01-28 NOTE — Telephone Encounter (Signed)
Left message to husband per Dr. Elease Hashimoto message

## 2014-01-28 NOTE — Telephone Encounter (Signed)
I encourage her to follow up with neurologist she has seen.

## 2014-02-05 ENCOUNTER — Other Ambulatory Visit: Payer: Self-pay | Admitting: Neurology

## 2014-02-05 DIAGNOSIS — R200 Anesthesia of skin: Secondary | ICD-10-CM

## 2014-02-05 DIAGNOSIS — T85695A Other mechanical complication of other nervous system device, implant or graft, initial encounter: Secondary | ICD-10-CM | POA: Diagnosis not present

## 2014-02-05 DIAGNOSIS — R531 Weakness: Secondary | ICD-10-CM

## 2014-02-05 DIAGNOSIS — G894 Chronic pain syndrome: Secondary | ICD-10-CM | POA: Diagnosis not present

## 2014-02-14 ENCOUNTER — Other Ambulatory Visit: Payer: Medicare Other

## 2014-02-14 DIAGNOSIS — R51 Headache: Secondary | ICD-10-CM | POA: Diagnosis not present

## 2014-02-14 DIAGNOSIS — G894 Chronic pain syndrome: Secondary | ICD-10-CM | POA: Diagnosis not present

## 2014-02-15 ENCOUNTER — Encounter: Payer: Medicare Other | Admitting: Neurology

## 2014-02-15 ENCOUNTER — Ambulatory Visit
Admission: RE | Admit: 2014-02-15 | Discharge: 2014-02-15 | Disposition: A | Discharge status: Home / Self Care | Payer: Medicare Other | Source: Ambulatory Visit | Attending: Neurology | Admitting: Neurology

## 2014-02-15 DIAGNOSIS — R209 Unspecified disturbances of skin sensation: Secondary | ICD-10-CM | POA: Diagnosis not present

## 2014-02-15 DIAGNOSIS — R531 Weakness: Secondary | ICD-10-CM

## 2014-02-15 DIAGNOSIS — R200 Anesthesia of skin: Secondary | ICD-10-CM

## 2014-02-21 DIAGNOSIS — G894 Chronic pain syndrome: Secondary | ICD-10-CM | POA: Diagnosis not present

## 2014-02-21 DIAGNOSIS — R51 Headache: Secondary | ICD-10-CM | POA: Diagnosis not present

## 2014-02-27 DIAGNOSIS — H00029 Hordeolum internum unspecified eye, unspecified eyelid: Secondary | ICD-10-CM | POA: Diagnosis not present

## 2014-02-27 DIAGNOSIS — H04129 Dry eye syndrome of unspecified lacrimal gland: Secondary | ICD-10-CM | POA: Diagnosis not present

## 2014-03-20 DIAGNOSIS — G43119 Migraine with aura, intractable, without status migrainosus: Secondary | ICD-10-CM | POA: Diagnosis not present

## 2014-03-20 DIAGNOSIS — M531 Cervicobrachial syndrome: Secondary | ICD-10-CM | POA: Diagnosis not present

## 2014-03-20 DIAGNOSIS — G90529 Complex regional pain syndrome I of unspecified lower limb: Secondary | ICD-10-CM | POA: Diagnosis not present

## 2014-03-20 DIAGNOSIS — G43719 Chronic migraine without aura, intractable, without status migrainosus: Secondary | ICD-10-CM | POA: Diagnosis not present

## 2014-03-28 ENCOUNTER — Encounter: Payer: Self-pay | Admitting: Family Medicine

## 2014-03-28 ENCOUNTER — Ambulatory Visit (INDEPENDENT_AMBULATORY_CARE_PROVIDER_SITE_OTHER): Payer: Medicare Other | Admitting: Family Medicine

## 2014-03-28 VITALS — BP 130/84 | HR 84 | Temp 97.8°F | Wt 237.0 lb

## 2014-03-28 DIAGNOSIS — R5383 Other fatigue: Secondary | ICD-10-CM

## 2014-03-28 DIAGNOSIS — R1310 Dysphagia, unspecified: Secondary | ICD-10-CM

## 2014-03-28 DIAGNOSIS — Z23 Encounter for immunization: Secondary | ICD-10-CM | POA: Diagnosis not present

## 2014-03-28 DIAGNOSIS — G609 Hereditary and idiopathic neuropathy, unspecified: Secondary | ICD-10-CM

## 2014-03-28 DIAGNOSIS — G629 Polyneuropathy, unspecified: Secondary | ICD-10-CM

## 2014-03-28 DIAGNOSIS — R5381 Other malaise: Secondary | ICD-10-CM

## 2014-03-28 DIAGNOSIS — R002 Palpitations: Secondary | ICD-10-CM

## 2014-03-28 MED ORDER — FUROSEMIDE 20 MG PO TABS: 20.0000 mg | ORAL_TABLET | Freq: Every day | ORAL | Status: DC | PRN

## 2014-03-28 MED ORDER — RABEPRAZOLE SODIUM 20 MG PO TBEC: 20.0000 mg | DELAYED_RELEASE_TABLET | ORAL | Status: DC

## 2014-03-28 MED ORDER — SILODOSIN 8 MG PO CAPS: 8.0000 mg | ORAL_CAPSULE | Freq: Every day | ORAL | Status: DC

## 2014-03-28 MED ORDER — MONTELUKAST SODIUM 10 MG PO TABS: 10.0000 mg | ORAL_TABLET | Freq: Every day | ORAL | Status: DC

## 2014-03-28 MED ORDER — ALPRAZOLAM 0.5 MG PO TABS: 0.5000 mg | ORAL_TABLET | Freq: Three times a day (TID) | ORAL | Status: DC | PRN

## 2014-03-28 MED ORDER — POTASSIUM CHLORIDE ER 10 MEQ PO TBCR: 10.0000 meq | EXTENDED_RELEASE_TABLET | Freq: Every day | ORAL | Status: DC | PRN

## 2014-03-28 NOTE — Progress Notes (Signed)
Pre visit review using our clinic review tool, if applicable. No additional management support is needed unless otherwise documented below in the visit note. 

## 2014-03-28 NOTE — Progress Notes (Signed)
Subjective:    Patient ID: Ashley Castaneda, female    DOB: Sep 26, 1950, 63 y.o.   MRN: [id number]  HPI Patient seen for medical followup. She has very complex past medical history and has multiple complaints today. She's had some issues recently with progressive bilateral sensory neuropathy and has seen multiple specialists including recent specialists at [facility] and [facility]. No clear etiology noted. She had removal in July at [facility] pain management clinic of brain stimulator. Recent MRI of brain revealed no acute changes. She does not have any weakness but does have progressive numbness and apparently there was  recommendation to do some sort of skin or muscle biopsy but she does not have details. She has had extensive blood work with B12, thyroid, etc to evaluate for neuropathy.  No diabetes hx.  Complains of progressive dysphagia with things like tablets and some solid foods over the past several months. She's not had any weight loss and in fact had some weight gain. She states she had EGD about 15 years ago. She has not had any recent active GERD symptoms. No pain with swallowing.  Concern for possible blood pressure instability. She states she has periods where her blood pressure seems to drop rapidly. She is occasionally confirm low blood pressures in the past. This is not just with immediate standing. She takes occasional furosemide for peripheral edema but no other antihypertensive medications. Has occasional palpitations with no clear trigger.  She has not taken her pulse.  Past Medical History  Diagnosis Date  . Unspecified vitamin D deficiency 03/31/2010  . ANXIETY 07/01/2008  . DEPRESSION 07/01/2008  . REFLEX SYMPATHETIC DYSTROPHY 07/01/2008  . Trigeminal neuralgia 05/21/2009  . Other specified trigeminal nerve disorders 07/01/2008  . ALLERGIC RHINITIS 07/01/2008  . BRONCHITIS, CHRONIC 07/01/2008  . GERD 07/01/2008  . ECZEMA 05/29/2009  . OSTEOARTHRITIS 07/01/2008  . OCCIPITAL  NEURALGIA 07/01/2008  . BACK PAIN, THORACIC REGION 03/04/2010  . FIBROMYALGIA 07/01/2008  . LEG PAIN, BILATERAL 07/01/2008  . FACIAL PAIN 12/25/2009  . CARPAL TUNNEL SYNDROME, HX OF 07/01/2008  . DVT, HX OF 07/01/2008  . MIGRAINES, HX OF 07/01/2008  . CHRONIC OBSTRUCTIVE PULMONARY DISEASE, ACUTE EXACERBATION 08/07/2010  . Hyperlipidemia    Past Surgical History  Procedure Laterality Date  . Lavh      BSO  . Tonsillectomy  1957  . Right foot bone spur  1987  . Left knee surgery  1989  . Right knee surgery  1990  . Right soulder surgery  [place]  . Head craniectomy  1994    MICROVASULAR DECOMPRESSION  . Right foot surgery  1998    BAD CUT  . Right jaw surgery  2002  . Right thumb sugery  2004  . Carpal tunnel release  2009    RIGHT  . Occipital nerve stimulator insertion      reports that she quit smoking about 2 years ago. Her smoking use included Cigarettes. She has a 60 pack-year smoking history. She has never used smokeless tobacco. She reports that she does not drink alcohol or use illicit drugs. family history includes Heart attack in her father and maternal grandmother; Hypertension in her father; Ovarian cancer (age of onset: 82) in her mother; Prostate cancer in her father; Stroke in her paternal grandmother. Allergies  Allergen Reactions  . Cefazolin     Ancef - in surgery, swelled up, turned blue, heart problems  . Baclofen     REACTION: confusion  . Carbamazepine  REACTION: confusion  . Ceclor [Cefaclor]     hives  . Fentanyl     REACTION: nausea and vomiting  . Gabapentin     REACTION: confusion  . Oxcarbazepine     REACTION: confusion      Review of Systems  Constitutional: Positive for fatigue. Negative for chills and appetite change. Unexpected weight change: gain.  HENT: Positive for trouble swallowing.   Respiratory: Negative for cough and shortness of breath.   Cardiovascular: Positive for palpitations and leg swelling. Negative for  chest pain.  Gastrointestinal: Negative for nausea, vomiting, abdominal pain, diarrhea and constipation.  Endocrine: Negative for polydipsia and polyuria.  Neurological: Positive for numbness and headaches.       Objective:   Physical Exam  Constitutional: She is oriented to person, place, and time. She appears well-developed and well-nourished.  HENT:  Mouth/Throat: Oropharynx is clear and moist.  Neck: Neck supple.  Cardiovascular: Normal rate and regular rhythm.   Pulmonary/Chest: Effort normal and breath sounds normal. No respiratory distress. She has no wheezes. She has no rales.  Musculoskeletal: She exhibits edema.  Trace nonpitting edema both legs.  Neurological: She is alert and oriented to person, place, and time. No cranial nerve deficit.          Assessment & Plan:  #1 progressive dysphagia. She does not have any red flags such as appetite or weight changes. Set up GI referral for further evaluation.  She is already on Aciphex with no active GERD symptoms. #2 progressive bilateral sensory neuropathy. She's had extensive neurologic evaluation which is ongoing. She will try to get [place] details of recommended "biopsy" requested by her neurologist. #3 intermittent non-positional dizziness and frequent palpitations. Question blood pressure instability. Consider 24-hour blood pressure monitor- if we can arrange.  Pt is encouraged to try to monitor blood pressure during episodes. Set up Holter monitor.

## 2014-03-29 ENCOUNTER — Encounter: Payer: Self-pay | Admitting: Gastroenterology

## 2014-04-02 ENCOUNTER — Encounter: Payer: Self-pay | Admitting: *Deleted

## 2014-04-02 ENCOUNTER — Telehealth: Payer: Self-pay | Admitting: Family Medicine

## 2014-04-02 ENCOUNTER — Ambulatory Visit (INDEPENDENT_AMBULATORY_CARE_PROVIDER_SITE_OTHER): Payer: Medicare Other | Admitting: *Deleted

## 2014-04-02 DIAGNOSIS — R42 Dizziness and giddiness: Secondary | ICD-10-CM

## 2014-04-02 DIAGNOSIS — R002 Palpitations: Secondary | ICD-10-CM | POA: Diagnosis not present

## 2014-04-02 NOTE — Telephone Encounter (Signed)
Pt called to say that the nurse at [facility] heart care told her if you still need pt to have a bp machine that the order will need to say ambulatory blood pressure monitor..  Pt said she can not see the  GI doctor until 05/29/14 .Marland Kitchen Her headache doctor found her a neuromuscular doctor in [facility] and she has an appt on 04/24/14

## 2014-04-02 NOTE — Progress Notes (Signed)
Patient ID: Ashley Castaneda, female   DOB: 18-Dec-1950, 63 y.o.   MRN: [id number] E-Cardio 24 hour holter monitor applied to patient.

## 2014-04-02 NOTE — Progress Notes (Signed)
Patient ID: Ashley Castaneda, female   DOB: Sep 24, 1950, 63 y.o.   MRN: [id number] E-Cardio 24 hour holter monitor applied to patient.

## 2014-04-03 ENCOUNTER — Other Ambulatory Visit: Payer: Self-pay | Admitting: Family Medicine

## 2014-04-03 DIAGNOSIS — I10 Essential (primary) hypertension: Secondary | ICD-10-CM

## 2014-04-03 NOTE — Telephone Encounter (Signed)
Order is placed.

## 2014-04-03 NOTE — Telephone Encounter (Signed)
See if we can get 24 hour ambulatory blood pressure monitor set up for her.

## 2014-04-04 ENCOUNTER — Encounter: Payer: Self-pay | Admitting: *Deleted

## 2014-04-04 ENCOUNTER — Encounter (INDEPENDENT_AMBULATORY_CARE_PROVIDER_SITE_OTHER): Payer: Medicare Other

## 2014-04-04 DIAGNOSIS — I10 Essential (primary) hypertension: Secondary | ICD-10-CM | POA: Diagnosis not present

## 2014-04-04 NOTE — Progress Notes (Signed)
Patient ID: Ashley Castaneda, female   DOB: 05-Oct-1950, 63 y.o.   MRN: [id number] 24 hour ambulatory blood pressure unit applied to patient.

## 2014-04-10 DIAGNOSIS — Z9181 History of falling: Secondary | ICD-10-CM | POA: Diagnosis not present

## 2014-04-10 DIAGNOSIS — G589 Mononeuropathy, unspecified: Secondary | ICD-10-CM | POA: Diagnosis not present

## 2014-04-10 DIAGNOSIS — M531 Cervicobrachial syndrome: Secondary | ICD-10-CM | POA: Diagnosis not present

## 2014-04-10 DIAGNOSIS — R51 Headache: Secondary | ICD-10-CM | POA: Diagnosis not present

## 2014-04-10 DIAGNOSIS — G894 Chronic pain syndrome: Secondary | ICD-10-CM | POA: Diagnosis not present

## 2014-04-10 DIAGNOSIS — Z8669 Personal history of other diseases of the nervous system and sense organs: Secondary | ICD-10-CM | POA: Diagnosis not present

## 2014-04-11 ENCOUNTER — Telehealth: Payer: Self-pay | Admitting: Family Medicine

## 2014-04-11 NOTE — Telephone Encounter (Signed)
Pt informed

## 2014-04-11 NOTE — Telephone Encounter (Signed)
Pt returning your call from yesterday.

## 2014-04-16 ENCOUNTER — Ambulatory Visit: Payer: Medicare Other | Admitting: Adult Health

## 2014-04-17 ENCOUNTER — Other Ambulatory Visit: Payer: Self-pay | Admitting: Neurology

## 2014-04-19 DIAGNOSIS — IMO0001 Reserved for inherently not codable concepts without codable children: Secondary | ICD-10-CM | POA: Diagnosis not present

## 2014-04-19 DIAGNOSIS — R51 Headache: Secondary | ICD-10-CM | POA: Diagnosis not present

## 2014-04-24 DIAGNOSIS — G589 Mononeuropathy, unspecified: Secondary | ICD-10-CM | POA: Diagnosis not present

## 2014-04-24 DIAGNOSIS — G43709 Chronic migraine without aura, not intractable, without status migrainosus: Secondary | ICD-10-CM | POA: Diagnosis not present

## 2014-04-24 DIAGNOSIS — IMO0001 Reserved for inherently not codable concepts without codable children: Secondary | ICD-10-CM | POA: Diagnosis not present

## 2014-04-24 DIAGNOSIS — H00029 Hordeolum internum unspecified eye, unspecified eyelid: Secondary | ICD-10-CM | POA: Diagnosis not present

## 2014-04-24 DIAGNOSIS — H01009 Unspecified blepharitis unspecified eye, unspecified eyelid: Secondary | ICD-10-CM | POA: Diagnosis not present

## 2014-04-24 DIAGNOSIS — M531 Cervicobrachial syndrome: Secondary | ICD-10-CM | POA: Diagnosis not present

## 2014-04-24 DIAGNOSIS — G500 Trigeminal neuralgia: Secondary | ICD-10-CM | POA: Diagnosis not present

## 2014-04-24 DIAGNOSIS — H04129 Dry eye syndrome of unspecified lacrimal gland: Secondary | ICD-10-CM | POA: Diagnosis not present

## 2014-04-30 ENCOUNTER — Telehealth: Payer: Self-pay | Admitting: Family Medicine

## 2014-04-30 DIAGNOSIS — R1310 Dysphagia, unspecified: Secondary | ICD-10-CM

## 2014-04-30 NOTE — Telephone Encounter (Signed)
Pt calling to report she is having difficulty swallowing.  She recently discussed this with pcp and was referred to GI.  However, her appt is not scheduled until 11/4.  Pt states Dr. Elease Hashimoto mentioned possible doing a swallow test.  Pt wants to know if this can be done since it is becoming worse for her to swallow her medications.

## 2014-05-01 NOTE — Telephone Encounter (Signed)
I have put in order for modified barium swallow evaluation.  Let pt know.

## 2014-05-02 NOTE — Telephone Encounter (Signed)
Pt informed

## 2014-05-02 NOTE — Telephone Encounter (Signed)
Left message for patient to return call.

## 2014-05-07 ENCOUNTER — Telehealth: Payer: Self-pay | Admitting: Family Medicine

## 2014-05-07 NOTE — Telephone Encounter (Signed)
Dr. B put in a referral for patient its in the other orders.

## 2014-05-07 NOTE — Telephone Encounter (Signed)
Pt states she was to have a swallowing test due to pills getting stuck in her throat. Pt has not heard anything and I do not see a referral. pls advise

## 2014-05-08 ENCOUNTER — Encounter: Payer: Self-pay | Admitting: Family Medicine

## 2014-05-08 ENCOUNTER — Ambulatory Visit (INDEPENDENT_AMBULATORY_CARE_PROVIDER_SITE_OTHER): Payer: Medicare Other | Admitting: Family Medicine

## 2014-05-08 ENCOUNTER — Other Ambulatory Visit: Payer: Self-pay | Admitting: Family Medicine

## 2014-05-08 VITALS — BP 130/68 | HR 96 | Temp 97.9°F | Wt 235.0 lb

## 2014-05-08 DIAGNOSIS — L309 Dermatitis, unspecified: Secondary | ICD-10-CM

## 2014-05-08 DIAGNOSIS — R1310 Dysphagia, unspecified: Secondary | ICD-10-CM

## 2014-05-08 MED ORDER — TRIAMCINOLONE ACETONIDE 0.1 % EX CREA: 1.0000 "application " | TOPICAL_CREAM | Freq: Two times a day (BID) | CUTANEOUS | Status: DC

## 2014-05-08 NOTE — Progress Notes (Signed)
Subjective:    Patient ID: Ashley Castaneda, female    DOB: Jan 01, 1951, 63 y.o.   MRN: [id number]  Rash Pertinent negatives include no fever.   Acute visit for skin rash. Left lateral thigh. Onset 10 days ago. Scaly and slightly pruritic. Patient apparently was recently started on lamictal for chronic headaches at low doses of 25 mg once daily. She was concerned that this may be related and was told by neurologist to stop that . She has not had any Stevens-Johnson-type symptoms or findings. No intraoral lesions and no hand or foot lesions. She has history of eczema involving ear canals previously. She has not tried anything on her current rash. No fevers or chills.  Past Medical History  Diagnosis Date  . Unspecified vitamin D deficiency 03/31/2010  . ANXIETY 07/01/2008  . DEPRESSION 07/01/2008  . REFLEX SYMPATHETIC DYSTROPHY 07/01/2008  . Trigeminal neuralgia 05/21/2009  . Other specified trigeminal nerve disorders 07/01/2008  . ALLERGIC RHINITIS 07/01/2008  . BRONCHITIS, CHRONIC 07/01/2008  . GERD 07/01/2008  . ECZEMA 05/29/2009  . OSTEOARTHRITIS 07/01/2008  . OCCIPITAL NEURALGIA 07/01/2008  . BACK PAIN, THORACIC REGION 03/04/2010  . FIBROMYALGIA 07/01/2008  . LEG PAIN, BILATERAL 07/01/2008  . FACIAL PAIN 12/25/2009  . CARPAL TUNNEL SYNDROME, HX OF 07/01/2008  . DVT, HX OF 07/01/2008  . MIGRAINES, HX OF 07/01/2008  . CHRONIC OBSTRUCTIVE PULMONARY DISEASE, ACUTE EXACERBATION 08/07/2010  . Hyperlipidemia    Past Surgical History  Procedure Laterality Date  . Lavh      BSO  . Tonsillectomy  1957  . Right foot bone spur  1987  . Left knee surgery  1989  . Right knee surgery  1990  . Right soulder surgery  [place]  . Head craniectomy  1994    MICROVASULAR DECOMPRESSION  . Right foot surgery  1998    BAD CUT  . Right jaw surgery  2002  . Right thumb sugery  2004  . Carpal tunnel release  2009    RIGHT  . Occipital nerve stimulator insertion      reports that she quit smoking  about 2 years ago. Her smoking use included Cigarettes. She has a 60 pack-year smoking history. She has never used smokeless tobacco. She reports that she does not drink alcohol or use illicit drugs. family history includes Heart attack in her father and maternal grandmother; Hypertension in her father; Ovarian cancer (age of onset: 24) in her mother; Prostate cancer in her father; Stroke in her paternal grandmother. Allergies  Allergen Reactions  . Cefazolin     Ancef - in surgery, swelled up, turned blue, heart problems  . Baclofen     REACTION: confusion  . Carbamazepine     REACTION: confusion  . Ceclor [Cefaclor]     hives  . Fentanyl     REACTION: nausea and vomiting  . Gabapentin     REACTION: confusion  . Oxcarbazepine     REACTION: confusion      Review of Systems  Constitutional: Negative for fever and chills.  Skin: Positive for rash.       Objective:   Physical Exam  Constitutional: She appears well-developed and well-nourished.  Cardiovascular: Normal rate and regular rhythm.   Skin: Rash noted.  Patient has scaly circumferential rash 2 cm on her left lateral thigh. No elevated border. No pustules.          Assessment & Plan:  Eczematous rash left lateral thigh. Suspect nummular eczema. This  does not appear compatible likely with drug rash-or tinea corporis. Triamcinolone 0.1% cream twice daily

## 2014-05-08 NOTE — Progress Notes (Signed)
Pre visit review using our clinic review tool, if applicable. No additional management support is needed unless otherwise documented below in the visit note. 

## 2014-05-13 ENCOUNTER — Ambulatory Visit (HOSPITAL_COMMUNITY)
Admission: RE | Admit: 2014-05-13 | Discharge: 2014-05-13 | Disposition: A | Discharge status: Home / Self Care | Payer: Medicare Other | Source: Ambulatory Visit | Attending: Family Medicine | Admitting: Family Medicine

## 2014-05-13 ENCOUNTER — Other Ambulatory Visit: Payer: Self-pay | Admitting: Family Medicine

## 2014-05-13 DIAGNOSIS — R05 Cough: Secondary | ICD-10-CM | POA: Insufficient documentation

## 2014-05-13 DIAGNOSIS — R1310 Dysphagia, unspecified: Secondary | ICD-10-CM | POA: Insufficient documentation

## 2014-05-13 DIAGNOSIS — G9050 Complex regional pain syndrome I, unspecified: Secondary | ICD-10-CM | POA: Diagnosis not present

## 2014-05-13 DIAGNOSIS — M5481 Occipital neuralgia: Secondary | ICD-10-CM | POA: Insufficient documentation

## 2014-05-13 DIAGNOSIS — R198 Other specified symptoms and signs involving the digestive system and abdomen: Secondary | ICD-10-CM

## 2014-05-13 DIAGNOSIS — G500 Trigeminal neuralgia: Secondary | ICD-10-CM | POA: Insufficient documentation

## 2014-05-13 DIAGNOSIS — R1319 Other dysphagia: Secondary | ICD-10-CM

## 2014-05-13 NOTE — Procedures (Signed)
Objective Swallowing Evaluation: Modified Barium Swallowing Study  Patient Details  Name: Ashley Castaneda MRN: [id number] Date of Birth: [birth year]  Today's Date: 05/13/2014 Time: 1130-1205 SLP Time Calculation (min): 35 min  Past Medical History:  Past Medical History  Diagnosis Date  . Unspecified vitamin D deficiency 03/31/2010  . ANXIETY 07/01/2008  . DEPRESSION 07/01/2008  . REFLEX SYMPATHETIC DYSTROPHY 07/01/2008  . Trigeminal neuralgia 05/21/2009  . Other specified trigeminal nerve disorders 07/01/2008  . ALLERGIC RHINITIS 07/01/2008  . BRONCHITIS, CHRONIC 07/01/2008  . GERD 07/01/2008  . ECZEMA 05/29/2009  . OSTEOARTHRITIS 07/01/2008  . OCCIPITAL NEURALGIA 07/01/2008  . BACK PAIN, THORACIC REGION 03/04/2010  . FIBROMYALGIA 07/01/2008  . LEG PAIN, BILATERAL 07/01/2008  . FACIAL PAIN 12/25/2009  . CARPAL TUNNEL SYNDROME, HX OF 07/01/2008  . DVT, HX OF 07/01/2008  . MIGRAINES, HX OF 07/01/2008  . CHRONIC OBSTRUCTIVE PULMONARY DISEASE, ACUTE EXACERBATION 08/07/2010  . Hyperlipidemia    Past Surgical History:  Past Surgical History  Procedure Laterality Date  . Lavh      BSO  . Tonsillectomy  1957  . Right foot bone spur  1987  . Left knee surgery  1989  . Right knee surgery  1990  . Right [place]oulder surgery  [place]  . Head craniectomy  1994    MICROVASULAR DECOMPRESSION  . Right foot surgery  1998    BAD CUT  . Right jaw surgery  2002  . Right thumb sugery  2004  . Carpal tunnel release  2009    RIGHT  . Occipital nerve stimulator insertion     HPI:  63 year old female referred to [facility] for OPMBSsecondary to globus sensation, difficulty swallowing pills, and c/o intermittent coughing during/after meals..  Pt reports a h/o trigeminal neurolgia & neuropathy,.     Assessment / Plan / Recommendation Clinical Impression  Dysphagia Diagnosis: Within Functional Limits Clinical impression: Patient exhibits a normal oropharyngeal swallow with all consistencies presented,  including the 13.mm barium tablet.  There was no aspriation, penetration, and no laryngeal residue after the swallow.  The pill and all consistencies passed through the esophagus without delay.    Treatment Recommendation  No treatment recommended at this time    Diet Recommendation Regular;Thin liquid   Liquid Administration via: Cup;Straw Medication Administration: Whole meds with liquid Supervision: Patient able to self feed Compensations: Slow rate;Small sips/bites Postural Changes and/or Swallow Maneuvers: Seated upright 90 degrees    Other  Recommendations Oral Care Recommendations: Oral care BID Other Recommendations: Clarify dietary restrictions   Follow Up Recommendations  None    Frequency and Duration        Pertinent Vitals/Pain n/a         General HPI: 63 year old female referred to [facility] for OPMBSsecondary to globus sensation, difficulty swallowing pills, and c/o intermittent coughing during/after meals..  Pt reports a h/o trigeminal neurolgia & neuropathy,. Type of Study: Modified Barium Swallowing Study Reason for Referral: Objectively evaluate swallowing function Previous Swallow Assessment: none Diet Prior to this Study: Regular;Thin liquids Temperature Spikes Noted: No Respiratory Status: Room air History of Recent Intubation: No Behavior/Cognition: Alert;Cooperative;Pleasant mood Oral Cavity - Dentition: Adequate natural dentition;Missing dentition Oral Motor / Sensory Function: Within functional limits Self-Feeding Abilities: Able to feed self Patient Positioning: Upright in chair Baseline Vocal Quality: Clear Volitional Cough: Strong Volitional Swallow: Able to elicit Anatomy: Within functional limits (Calcification was noted near area of aryteniod cartilage) Pharyngeal Secretions: Not observed secondary MBS  Reason for Referral Objectively evaluate swallowing function   Oral Phase Oral Preparation/Oral Phase Oral Phase: WFL   Pharyngeal Phase  Pharyngeal Phase Pharyngeal Phase: Within functional limits  Cervical Esophageal Phase    GO    Cervical Esophageal Phase Cervical Esophageal Phase: [facility]    Functional Assessment Tool Used: Clinical jusdgement/MBS Functional Limitations: Swallowing Swallow Current Status (O2774): 0 percent impaired, limited or restricted Swallow Goal Status (J2878): 0 percent impaired, limited or restricted Swallow Discharge Status (M7672): 0 percent impaired, limited or restricted    Quinn Axe T 05/13/2014, 1:51 PM

## 2014-05-14 DIAGNOSIS — R202 Paresthesia of skin: Secondary | ICD-10-CM | POA: Diagnosis not present

## 2014-05-14 DIAGNOSIS — G9009 Other idiopathic peripheral autonomic neuropathy: Secondary | ICD-10-CM | POA: Diagnosis not present

## 2014-05-14 DIAGNOSIS — G629 Polyneuropathy, unspecified: Secondary | ICD-10-CM | POA: Diagnosis not present

## 2014-05-21 DIAGNOSIS — G9009 Other idiopathic peripheral autonomic neuropathy: Secondary | ICD-10-CM | POA: Diagnosis not present

## 2014-05-21 DIAGNOSIS — R202 Paresthesia of skin: Secondary | ICD-10-CM | POA: Diagnosis not present

## 2014-05-23 DIAGNOSIS — R51 Headache: Secondary | ICD-10-CM | POA: Diagnosis not present

## 2014-05-29 ENCOUNTER — Encounter: Payer: Self-pay | Admitting: Gastroenterology

## 2014-05-29 ENCOUNTER — Ambulatory Visit (INDEPENDENT_AMBULATORY_CARE_PROVIDER_SITE_OTHER): Payer: Medicare Other | Admitting: Gastroenterology

## 2014-05-29 VITALS — BP 128/80 | HR 80 | Wt 233.4 lb

## 2014-05-29 DIAGNOSIS — K219 Gastro-esophageal reflux disease without esophagitis: Secondary | ICD-10-CM | POA: Diagnosis not present

## 2014-05-29 DIAGNOSIS — F458 Other somatoform disorders: Secondary | ICD-10-CM

## 2014-05-29 DIAGNOSIS — K5901 Slow transit constipation: Secondary | ICD-10-CM | POA: Diagnosis not present

## 2014-05-29 DIAGNOSIS — R1314 Dysphagia, pharyngoesophageal phase: Secondary | ICD-10-CM | POA: Diagnosis not present

## 2014-05-29 DIAGNOSIS — R0989 Other specified symptoms and signs involving the circulatory and respiratory systems: Secondary | ICD-10-CM

## 2014-05-29 MED ORDER — RABEPRAZOLE SODIUM 20 MG PO TBEC: 20.0000 mg | DELAYED_RELEASE_TABLET | Freq: Two times a day (BID) | ORAL | Status: DC

## 2014-05-29 MED ORDER — RABEPRAZOLE SODIUM 20 MG PO TBEC
20.0000 mg | DELAYED_RELEASE_TABLET | Freq: Two times a day (BID) | ORAL | Status: DC
Start: 2014-05-29 — End: 2014-05-29

## 2014-05-29 NOTE — Progress Notes (Addendum)
History of Present Illness: This is a 63 year old female referred by Dr. Elease Castaneda. She has very complex past medical history and has two GI complaints today including sensation of something in her throat and worsening chronic constipation. She's has a progressive neuropathy and has seen multiple specialists including recent specialists at [facility] and [facility]. She states she is now undergoing evaluation with [facility]. No clear diagnosis established by her report. In July she had removal of brain stimulator at [facility]. Recent MRI of brain revealed no acute changes. Her neurologic symptoms began in May 2015 and progressed over several weeks. At that time she also noticed a sensation of something in her throat and she has had intermittent difficulty swallowing food pills and occasionally water. She has a history of GERD and has been followed by Dr. Watt Climes. She states she last underwent endoscopy and colonoscopy by Dr. Watt Climes in 2010. Unfortunately we do not have these records available today. She states her reflux symptoms have been under good control on daily AcipHex for many years. She notes worsening problems with chronic constipation since her neurologic symptoms worsened. She has been taking increasing doses of laxatives. She has been much less active due to her neurologic symptoms. Denies weight loss, abdominal pain, diarrhea, change in stool caliber, melena, hematochezia, nausea, vomiting, chest pain.  04/2014 Dysphagia Diagnosis: Within Functional Limits Clinical impression: Patient exhibits a normal oropharyngeal swallow with all consistencies presented, including the 13.mm barium tablet. There was no aspriation, penetration, and no laryngeal residue after the swallow. The pill and all consistencies passed through the esophagus without delay.  Review of Systems: Pertinent positive and negative review of systems were noted in the above HPI section. All other review of systems were otherwise  negative.  Current Medications, Allergies, Past Medical History, Past Surgical History, Family History and Social History were reviewed in [employer] record.  Physical Exam: General: Well developed , well nourished, no acute distress Head: Normocephalic and atraumatic Eyes:  sclerae anicteric, EOMI Ears: Normal auditory acuity Mouth: No deformity or lesions Neck: Supple, no masses or thyromegaly Lungs: Clear throughout to auscultation Heart: Regular rate and rhythm; no murmurs, rubs or bruits Abdomen: Soft, non tender and non distended. No masses, hepatosplenomegaly or hernias noted. Normal Bowel sounds Musculoskeletal: Symmetrical with no gross deformities  Skin: No lesions on visible extremities Pulses:  Normal pulses noted Extremities: No clubbing, cyanosis, edema or deformities noted Neurological: Alert oriented x 4, generalized weakness, using a walker. Cervical Nodes:  No significant cervical adenopathy Inguinal Nodes: No significant inguinal adenopathy Psychological:  Alert and cooperative. Normal mood and affect  Assessment and Recommendations:  1. Globus sensation, dysphagia. Speech pathology evaluation unremarkable. I feel that her dysphasia  symptoms and globus sensation are related to her as yet diagnosed neurologic disorder or other non GI disorder. She may have GERD with LPR. She may have allergies or sinus drainage contributing to symptoms. Increase AcipHex to 20 mg twice daily for possible LPR. Schedule barium esophagram. Await records from Dr. Watt Climes.  2. Chronic constipation, recently worsened. Increase the use of Senokot laxatives. Await records from Dr. Watt Climes.   06/05/2014. Records received and reviewed from Dr. Watt Climes. Colonoscopy performed in March 2010 by Dr. Watt Climes showed 3 small adenomatous colon polyps melanosis coli and small external/internal hemorrhoids. Upper endoscopy performed in March 2010 by Dr. Watt Climes showed a small hiatal hernia  and no other abnormalities. She was treated by Dr. Watt Climes for esophageal reflux and slow transit constipation. In addition  she underwent upper endoscopy and colonoscopy by Dr. Watt Climes in 1993 without significant findings.

## 2014-05-29 NOTE — Patient Instructions (Addendum)
You have been scheduled for a Barium Swallow at [facility] 05/26/14 please arrive at 10:40 for an 11:00 appointment.  There are no restrictions for this exam.  Thank you for choosing Dr. Fuller Plan and [facility]

## 2014-05-29 NOTE — Addendum Note (Signed)
Addended by: Marlon Pel on: 05/29/2014 02:25 PM   Modules accepted: Orders

## 2014-06-04 ENCOUNTER — Ambulatory Visit
Admission: RE | Admit: 2014-06-04 | Discharge: 2014-06-04 | Disposition: A | Discharge status: Home / Self Care | Payer: Medicare Other | Source: Ambulatory Visit | Attending: Gastroenterology | Admitting: Gastroenterology

## 2014-06-04 DIAGNOSIS — R1314 Dysphagia, pharyngoesophageal phase: Secondary | ICD-10-CM

## 2014-06-04 DIAGNOSIS — K219 Gastro-esophageal reflux disease without esophagitis: Secondary | ICD-10-CM

## 2014-06-04 DIAGNOSIS — R0989 Other specified symptoms and signs involving the circulatory and respiratory systems: Secondary | ICD-10-CM

## 2014-06-04 DIAGNOSIS — K228 Other specified diseases of esophagus: Secondary | ICD-10-CM | POA: Diagnosis not present

## 2014-06-05 DIAGNOSIS — M791 Myalgia: Secondary | ICD-10-CM | POA: Diagnosis not present

## 2014-06-05 DIAGNOSIS — M792 Neuralgia and neuritis, unspecified: Secondary | ICD-10-CM | POA: Diagnosis not present

## 2014-06-05 DIAGNOSIS — M6281 Muscle weakness (generalized): Secondary | ICD-10-CM | POA: Diagnosis not present

## 2014-06-05 DIAGNOSIS — G43719 Chronic migraine without aura, intractable, without status migrainosus: Secondary | ICD-10-CM | POA: Diagnosis not present

## 2014-06-12 ENCOUNTER — Encounter: Payer: Self-pay | Admitting: Neurology

## 2014-06-12 DIAGNOSIS — L309 Dermatitis, unspecified: Secondary | ICD-10-CM | POA: Diagnosis not present

## 2014-06-12 DIAGNOSIS — L905 Scar conditions and fibrosis of skin: Secondary | ICD-10-CM | POA: Diagnosis not present

## 2014-06-18 ENCOUNTER — Encounter: Payer: Self-pay | Admitting: Neurology

## 2014-06-26 DIAGNOSIS — R609 Edema, unspecified: Secondary | ICD-10-CM | POA: Diagnosis not present

## 2014-06-26 DIAGNOSIS — Z79899 Other long term (current) drug therapy: Secondary | ICD-10-CM | POA: Diagnosis not present

## 2014-06-26 DIAGNOSIS — G894 Chronic pain syndrome: Secondary | ICD-10-CM | POA: Diagnosis not present

## 2014-06-26 DIAGNOSIS — K1370 Unspecified lesions of oral mucosa: Secondary | ICD-10-CM | POA: Diagnosis not present

## 2014-06-26 DIAGNOSIS — R51 Headache: Secondary | ICD-10-CM | POA: Diagnosis not present

## 2014-06-26 DIAGNOSIS — R42 Dizziness and giddiness: Secondary | ICD-10-CM | POA: Diagnosis not present

## 2014-06-26 DIAGNOSIS — G603 Idiopathic progressive neuropathy: Secondary | ICD-10-CM | POA: Diagnosis not present

## 2014-07-03 ENCOUNTER — Encounter: Payer: Self-pay | Admitting: Family Medicine

## 2014-07-03 ENCOUNTER — Ambulatory Visit (INDEPENDENT_AMBULATORY_CARE_PROVIDER_SITE_OTHER): Payer: Medicare Other | Admitting: Family Medicine

## 2014-07-03 VITALS — BP 130/80 | HR 82 | Temp 97.8°F | Wt 233.0 lb

## 2014-07-03 DIAGNOSIS — K120 Recurrent oral aphthae: Secondary | ICD-10-CM | POA: Diagnosis not present

## 2014-07-03 DIAGNOSIS — L309 Dermatitis, unspecified: Secondary | ICD-10-CM

## 2014-07-03 DIAGNOSIS — J3489 Other specified disorders of nose and nasal sinuses: Secondary | ICD-10-CM | POA: Diagnosis not present

## 2014-07-03 MED ORDER — MELOXICAM 7.5 MG PO TABS: 7.5000 mg | ORAL_TABLET | Freq: Every day | ORAL | Status: DC

## 2014-07-03 MED ORDER — TRIAMCINOLONE ACETONIDE 0.1 % MT PSTE: 1.0000 "application " | PASTE | Freq: Three times a day (TID) | OROMUCOSAL | Status: DC

## 2014-07-03 NOTE — Patient Instructions (Signed)
Canker Sores  Canker sores are painful, open sores on the inside of the mouth and cheek. They may be white or yellow. The sores usually heal in 1 to 2 weeks. Women are more likely than men to have recurrent canker sores. CAUSES The cause of canker sores is not well understood. More than one cause is likely. Canker sores do not appear to be caused by certain types of germs (viruses or bacteria). Canker sores may be caused by:  An allergic reaction to certain foods.  Digestive problems.  Not having enough vitamin B76, folic acid, and iron.  Female sex hormones. Sores may come only during certain phases of a menstrual cycle. Often, there is improvement during pregnancy.  Genetics. Some people seem to inherit canker sore problems. Emotional stress and injuries to the mouth may trigger outbreaks, but not cause them.  DIAGNOSIS Canker sores are diagnosed by exam.  TREATMENT  Patients who have frequent bouts of canker sores may have cultures taken of the sores, blood tests, or allergy tests. This helps determine if their sores are caused by a poor diet, an allergy, or some other preventable or treatable disease.  Vitamins may prevent recurrences or reduce the severity of canker sores in people with poor nutrition.  Numbing ointments can relieve pain. These are available in drug stores without a prescription.  Anti-inflammatory steroid mouth rinses or gels may be prescribed by your caregiver for severe sores.  Oral steroids may be prescribed if you have severe, recurrent canker sores. These strong medicines can cause many side effects and should be used only under the close direction of a dentist or physician.  Mouth rinses containing the antibiotic medicine may be prescribed. They may lessen symptoms and speed healing. Healing usually happens in about 1 or 2 weeks with or without treatment. Certain antibiotic mouth rinses given to pregnant women and young children can permanently stain teeth.  Talk to your caregiver about your treatment. HOME CARE INSTRUCTIONS   Avoid foods that cause canker sores for you.  Avoid citrus juices, spicy or salty foods, and coffee until the sores are healed.  Use a soft-bristled toothbrush.  Chew your food carefully to avoid biting your cheek.  Apply topical numbing medicine to the sore to help relieve pain.  Apply a thin paste of baking soda and water to the sore to help heal the sore.  Only use mouth rinses or medicines for pain or discomfort as directed by your caregiver. SEEK MEDICAL CARE IF:   Your symptoms are not better in 1 week.  Your sores are still present after 2 weeks.  Your sores are very painful.  You have trouble breathing or swallowing.  Your sores come back frequently. Document Released: 11/06/2010 Document Revised: 11/06/2012 Document Reviewed: 11/06/2010 [facility] Patient Information 2015 [place], [place]. This information is not intended to replace advice given to you by your health care provider. Make sure you discuss any questions you have with your health care provider.  Use triamcinolone cream to affected rash left ear.

## 2014-07-03 NOTE — Progress Notes (Signed)
Subjective:    Patient ID: Ashley Castaneda, female    DOB: July 01, 1951, 63 y.o.   MRN: [id number]  HPI Patient has very complex past medical history. She has been seeing multiple specialists at [facility] and [facility]. She's been diagnosed with small fiber neuropathy. She is very discouraged overall- because of her lack of improvement.. She seen today with sores involving left external ear, inside lower lip, and right naris. She's not tried anything topically for any of these. She is not on any new medications. Denies any recent nosebleeds.  Past Medical History  Diagnosis Date  . Unspecified vitamin D deficiency 03/31/2010  . ANXIETY 07/01/2008  . DEPRESSION 07/01/2008  . REFLEX SYMPATHETIC DYSTROPHY 07/01/2008  . Trigeminal neuralgia 05/21/2009  . Other specified trigeminal nerve disorders 07/01/2008  . ALLERGIC RHINITIS 07/01/2008  . BRONCHITIS, CHRONIC 07/01/2008  . GERD 07/01/2008  . ECZEMA 05/29/2009  . OSTEOARTHRITIS 07/01/2008  . OCCIPITAL NEURALGIA 07/01/2008  . BACK PAIN, THORACIC REGION 03/04/2010  . FIBROMYALGIA 07/01/2008  . LEG PAIN, BILATERAL 07/01/2008  . FACIAL PAIN 12/25/2009  . CARPAL TUNNEL SYNDROME, HX OF 07/01/2008  . DVT, HX OF 07/01/2008  . MIGRAINES, HX OF 07/01/2008  . CHRONIC OBSTRUCTIVE PULMONARY DISEASE, ACUTE EXACERBATION 08/07/2010  . Hyperlipidemia    Past Surgical History  Procedure Laterality Date  . Lavh      BSO  . Tonsillectomy  1957  . Right foot bone spur  1987  . Left knee surgery  1989  . Right knee surgery  1990  . Right soulder surgery  [place]  . Head craniectomy  1994    MICROVASULAR DECOMPRESSION  . Right foot surgery  1998    BAD CUT  . Right jaw surgery  2002  . Right thumb sugery  2004  . Carpal tunnel release  2009    RIGHT  . Occipital nerve stimulator insertion      reports that she quit smoking about 2 years ago. Her smoking use included Cigarettes. She has a 60 pack-year smoking history. She has never used  smokeless tobacco. She reports that she does not drink alcohol or use illicit drugs. family history includes Heart attack in her father and maternal grandmother; Hypertension in her father; Ovarian cancer (age of onset: 44) in her mother; Prostate cancer in her father; Stroke in her paternal grandmother. Allergies  Allergen Reactions  . Cefazolin     Ancef - in surgery, swelled up, turned blue, heart problems  . Baclofen     REACTION: confusion  . Carbamazepine     REACTION: confusion  . Ceclor [Cefaclor]     hives  . Fentanyl     REACTION: nausea and vomiting  . Gabapentin     REACTION: confusion  . Oxcarbazepine     REACTION: confusion      Review of Systems  Constitutional: Positive for fatigue. Negative for fever and chills.  HENT: Negative for nosebleeds and trouble swallowing.   Respiratory: Negative for cough.   Cardiovascular: Negative for chest pain.  Gastrointestinal: Negative for abdominal pain.  Musculoskeletal: Positive for arthralgias.  Skin: Positive for rash.  Neurological: Positive for weakness and numbness.  Psychiatric/Behavioral: Negative for confusion.       Objective:   Physical Exam  Constitutional: She is oriented to person, place, and time. She appears well-developed and well-nourished.  HENT:  Oropharynx reveals a couple of classic canker sores in her left lower lip. No other concerning lesions. Right naris reveals  some dryness along the keys a lot plexus region. No active bleeding. Left ear external canal reveals area of fissure near the tragus  Cardiovascular: Normal rate and regular rhythm.   Pulmonary/Chest: Effort normal and breath sounds normal. No respiratory distress. She has no wheezes.  Musculoskeletal: She exhibits no edema.  Neurological: She is alert and oriented to person, place, and time.          Assessment & Plan:  #1 aphthous ulcers lower lip. Kenalog in [place] use 2-3 times daily #2 eczematous type rash left external  canal. Triamcinolone 0.1% cream twice daily as needed #3 dryness right naris. She will try moisturizers and nightly normal saline

## 2014-07-03 NOTE — Progress Notes (Signed)
Pre visit review using our clinic review tool, if applicable. No additional management support is needed unless otherwise documented below in the visit note. 

## 2014-07-04 ENCOUNTER — Telehealth: Payer: Self-pay | Admitting: Family Medicine

## 2014-07-04 NOTE — Telephone Encounter (Signed)
Pt needs a referral from [facility] for pt to see dr Amil Amen phone # [phone] for ?autoimmune  dz.. Pt has [facility] and [facility]. Pt has seen a neurologist in [place]. Dr Amil Amen may need those records. Can we referl?. Pt saw [facility] yesterday. Pt has seen dr Amil Amen years ago

## 2014-07-04 NOTE — Telephone Encounter (Signed)
OK to refer- she has seen him in the past.

## 2014-07-05 ENCOUNTER — Other Ambulatory Visit: Payer: Self-pay | Admitting: Family Medicine

## 2014-07-05 DIAGNOSIS — M359 Systemic involvement of connective tissue, unspecified: Secondary | ICD-10-CM

## 2014-07-05 NOTE — Telephone Encounter (Signed)
Referral is ordered

## 2014-07-12 ENCOUNTER — Telehealth: Payer: Self-pay | Admitting: Gastroenterology

## 2014-07-12 MED ORDER — LINACLOTIDE 290 MCG PO CAPS: 290.0000 ug | ORAL_CAPSULE | Freq: Every day | ORAL | Status: DC

## 2014-07-12 NOTE — Telephone Encounter (Signed)
Very complex patient. She can try Linzess 290 mcg daily.

## 2014-07-12 NOTE — Telephone Encounter (Signed)
Patient is c/o continued constipation despite multiple OTC products.  She is due for colon in 09/2014.  Should she try Miralax instead of senokot? Please advise

## 2014-07-12 NOTE — Telephone Encounter (Signed)
Patient notified

## 2014-07-30 DIAGNOSIS — H04123 Dry eye syndrome of bilateral lacrimal glands: Secondary | ICD-10-CM | POA: Diagnosis not present

## 2014-07-30 DIAGNOSIS — G500 Trigeminal neuralgia: Secondary | ICD-10-CM | POA: Diagnosis not present

## 2014-07-30 DIAGNOSIS — H01005 Unspecified blepharitis left lower eyelid: Secondary | ICD-10-CM | POA: Diagnosis not present

## 2014-07-30 DIAGNOSIS — H0259 Other disorders affecting eyelid function: Secondary | ICD-10-CM | POA: Diagnosis not present

## 2014-07-30 DIAGNOSIS — Z87891 Personal history of nicotine dependence: Secondary | ICD-10-CM | POA: Diagnosis not present

## 2014-07-30 DIAGNOSIS — H01002 Unspecified blepharitis right lower eyelid: Secondary | ICD-10-CM | POA: Diagnosis not present

## 2014-07-30 DIAGNOSIS — Z7982 Long term (current) use of aspirin: Secondary | ICD-10-CM | POA: Diagnosis not present

## 2014-07-30 DIAGNOSIS — H01004 Unspecified blepharitis left upper eyelid: Secondary | ICD-10-CM | POA: Diagnosis not present

## 2014-07-30 DIAGNOSIS — G629 Polyneuropathy, unspecified: Secondary | ICD-10-CM | POA: Diagnosis not present

## 2014-07-30 DIAGNOSIS — Z79899 Other long term (current) drug therapy: Secondary | ICD-10-CM | POA: Diagnosis not present

## 2014-07-30 DIAGNOSIS — E038 Other specified hypothyroidism: Secondary | ICD-10-CM | POA: Diagnosis not present

## 2014-07-30 DIAGNOSIS — H2513 Age-related nuclear cataract, bilateral: Secondary | ICD-10-CM | POA: Diagnosis not present

## 2014-07-30 DIAGNOSIS — M797 Fibromyalgia: Secondary | ICD-10-CM | POA: Diagnosis not present

## 2014-07-30 DIAGNOSIS — H11002 Unspecified pterygium of left eye: Secondary | ICD-10-CM | POA: Diagnosis not present

## 2014-08-03 DIAGNOSIS — H6990 Unspecified Eustachian tube disorder, unspecified ear: Secondary | ICD-10-CM | POA: Diagnosis not present

## 2014-08-03 DIAGNOSIS — H9201 Otalgia, right ear: Secondary | ICD-10-CM | POA: Diagnosis not present

## 2014-08-07 DIAGNOSIS — M791 Myalgia: Secondary | ICD-10-CM | POA: Diagnosis not present

## 2014-08-07 DIAGNOSIS — M792 Neuralgia and neuritis, unspecified: Secondary | ICD-10-CM | POA: Diagnosis not present

## 2014-08-07 DIAGNOSIS — G8929 Other chronic pain: Secondary | ICD-10-CM | POA: Diagnosis not present

## 2014-08-07 DIAGNOSIS — G43719 Chronic migraine without aura, intractable, without status migrainosus: Secondary | ICD-10-CM | POA: Diagnosis not present

## 2014-08-12 ENCOUNTER — Telehealth: Payer: Self-pay | Admitting: Family Medicine

## 2014-08-12 NOTE — Telephone Encounter (Signed)
Pt was seen on 12-9 and per pt dr Elease Hashimoto said he would need about  1 hr to go over everything in her chart. Pt has seen multi-specialist. Pt is having hand numbness

## 2014-08-12 NOTE — Telephone Encounter (Signed)
Pt is going to see a Rheumatoid doctor next week. Informed patient that you would like her to see another neurology doctor. She wants to wait to see what the doctor next week say. Patient also wants to know is there a ointment that she can take for her mouth she is using a paste and she states that it is to thick in her mouth but it does help.

## 2014-08-13 DIAGNOSIS — M170 Bilateral primary osteoarthritis of knee: Secondary | ICD-10-CM | POA: Diagnosis not present

## 2014-08-13 NOTE — Telephone Encounter (Signed)
Dexamethasone ointment apply to affected areas bid prn.

## 2014-08-14 NOTE — Telephone Encounter (Signed)
I do not see this ointment for the mouth only for the eyes and ears.

## 2014-08-14 NOTE — Telephone Encounter (Signed)
i do think they make a Dexamethasone elixir which she can use QID

## 2014-08-15 MED ORDER — DEXAMETHASONE 0.5 MG/5ML PO ELIX: 1.0000 mg | ORAL_SOLUTION | Freq: Four times a day (QID) | ORAL | Status: DC

## 2014-08-15 NOTE — Telephone Encounter (Signed)
Rx sent to pharmacy   

## 2014-08-22 ENCOUNTER — Encounter: Payer: Self-pay | Admitting: Gastroenterology

## 2014-08-23 DIAGNOSIS — H04129 Dry eye syndrome of unspecified lacrimal gland: Secondary | ICD-10-CM | POA: Diagnosis not present

## 2014-08-23 DIAGNOSIS — G609 Hereditary and idiopathic neuropathy, unspecified: Secondary | ICD-10-CM | POA: Diagnosis not present

## 2014-08-23 DIAGNOSIS — G64 Other disorders of peripheral nervous system: Secondary | ICD-10-CM | POA: Diagnosis not present

## 2014-08-24 DIAGNOSIS — D8989 Other specified disorders involving the immune mechanism, not elsewhere classified: Secondary | ICD-10-CM | POA: Diagnosis not present

## 2014-09-05 DIAGNOSIS — M792 Neuralgia and neuritis, unspecified: Secondary | ICD-10-CM | POA: Diagnosis not present

## 2014-09-05 DIAGNOSIS — G43719 Chronic migraine without aura, intractable, without status migrainosus: Secondary | ICD-10-CM | POA: Diagnosis not present

## 2014-09-05 DIAGNOSIS — H04129 Dry eye syndrome of unspecified lacrimal gland: Secondary | ICD-10-CM | POA: Diagnosis not present

## 2014-09-05 DIAGNOSIS — M791 Myalgia: Secondary | ICD-10-CM | POA: Diagnosis not present

## 2014-09-05 DIAGNOSIS — G90529 Complex regional pain syndrome I of unspecified lower limb: Secondary | ICD-10-CM | POA: Diagnosis not present

## 2014-09-06 ENCOUNTER — Telehealth: Payer: Self-pay | Admitting: Family Medicine

## 2014-09-06 NOTE — Telephone Encounter (Signed)
Pt states her meloxicam (MOBIC) 7.5 MG tablet is supposed to be 15 mg and not 7.5mg .  She states her ortho doc precribed this, but wants dr burchette to take over. And ALPRAZolam (XANAX) 0.5 MG tablet 90 tab /1 tid  Meds by mail / Lurena Joiner [phone]

## 2014-09-07 NOTE — Telephone Encounter (Signed)
May change Mobic to 15 mg and refill both for 6 months.

## 2014-09-09 MED ORDER — ALPRAZOLAM 0.5 MG PO TABS: 0.5000 mg | ORAL_TABLET | Freq: Three times a day (TID) | ORAL | Status: DC | PRN

## 2014-09-09 MED ORDER — MELOXICAM 15 MG PO TABS: 15.0000 mg | ORAL_TABLET | Freq: Every day | ORAL | Status: DC

## 2014-09-09 NOTE — Telephone Encounter (Signed)
RX sent to mail order 

## 2014-09-16 DIAGNOSIS — R682 Dry mouth, unspecified: Secondary | ICD-10-CM | POA: Diagnosis not present

## 2014-09-23 DIAGNOSIS — M1712 Unilateral primary osteoarthritis, left knee: Secondary | ICD-10-CM | POA: Diagnosis not present

## 2014-09-23 DIAGNOSIS — M1711 Unilateral primary osteoarthritis, right knee: Secondary | ICD-10-CM | POA: Diagnosis not present

## 2014-09-23 DIAGNOSIS — M25532 Pain in left wrist: Secondary | ICD-10-CM | POA: Diagnosis not present

## 2014-09-30 DIAGNOSIS — E1142 Type 2 diabetes mellitus with diabetic polyneuropathy: Secondary | ICD-10-CM | POA: Diagnosis not present

## 2014-09-30 DIAGNOSIS — G629 Polyneuropathy, unspecified: Secondary | ICD-10-CM | POA: Diagnosis not present

## 2014-09-30 DIAGNOSIS — M792 Neuralgia and neuritis, unspecified: Secondary | ICD-10-CM | POA: Diagnosis not present

## 2014-09-30 DIAGNOSIS — K769 Liver disease, unspecified: Secondary | ICD-10-CM | POA: Diagnosis not present

## 2014-09-30 DIAGNOSIS — E559 Vitamin D deficiency, unspecified: Secondary | ICD-10-CM | POA: Diagnosis not present

## 2014-09-30 DIAGNOSIS — G9009 Other idiopathic peripheral autonomic neuropathy: Secondary | ICD-10-CM | POA: Diagnosis not present

## 2014-09-30 DIAGNOSIS — E538 Deficiency of other specified B group vitamins: Secondary | ICD-10-CM | POA: Diagnosis not present

## 2014-09-30 DIAGNOSIS — G43719 Chronic migraine without aura, intractable, without status migrainosus: Secondary | ICD-10-CM | POA: Diagnosis not present

## 2014-09-30 DIAGNOSIS — M62838 Other muscle spasm: Secondary | ICD-10-CM | POA: Diagnosis not present

## 2014-10-03 DIAGNOSIS — M1711 Unilateral primary osteoarthritis, right knee: Secondary | ICD-10-CM | POA: Diagnosis not present

## 2014-10-03 DIAGNOSIS — M542 Cervicalgia: Secondary | ICD-10-CM | POA: Diagnosis not present

## 2014-10-03 DIAGNOSIS — M545 Low back pain: Secondary | ICD-10-CM | POA: Diagnosis not present

## 2014-10-03 DIAGNOSIS — M546 Pain in thoracic spine: Secondary | ICD-10-CM | POA: Diagnosis not present

## 2014-10-03 DIAGNOSIS — S8001XA Contusion of right knee, initial encounter: Secondary | ICD-10-CM | POA: Diagnosis not present

## 2014-10-03 DIAGNOSIS — M1712 Unilateral primary osteoarthritis, left knee: Secondary | ICD-10-CM | POA: Diagnosis not present

## 2014-10-10 DIAGNOSIS — S8001XA Contusion of right knee, initial encounter: Secondary | ICD-10-CM | POA: Diagnosis not present

## 2014-10-10 DIAGNOSIS — M545 Low back pain: Secondary | ICD-10-CM | POA: Diagnosis not present

## 2014-10-10 DIAGNOSIS — M546 Pain in thoracic spine: Secondary | ICD-10-CM | POA: Diagnosis not present

## 2014-10-10 DIAGNOSIS — M1712 Unilateral primary osteoarthritis, left knee: Secondary | ICD-10-CM | POA: Diagnosis not present

## 2014-10-10 DIAGNOSIS — M542 Cervicalgia: Secondary | ICD-10-CM | POA: Diagnosis not present

## 2014-10-10 DIAGNOSIS — M1711 Unilateral primary osteoarthritis, right knee: Secondary | ICD-10-CM | POA: Diagnosis not present

## 2014-10-11 ENCOUNTER — Other Ambulatory Visit (HOSPITAL_COMMUNITY): Payer: Self-pay | Admitting: Orthopedic Surgery

## 2014-10-11 ENCOUNTER — Ambulatory Visit (HOSPITAL_COMMUNITY)
Admission: RE | Admit: 2014-10-11 | Discharge: 2014-10-11 | Disposition: A | Discharge status: Home / Self Care | Payer: Medicare Other | Source: Ambulatory Visit | Attending: Cardiovascular Disease | Admitting: Cardiovascular Disease

## 2014-10-11 DIAGNOSIS — M7989 Other specified soft tissue disorders: Secondary | ICD-10-CM | POA: Diagnosis not present

## 2014-10-11 NOTE — Progress Notes (Signed)
Right Lower Ext. Venous Duplex Completed. Negative for DVT or SVT.  Oda Cogan, BS, RDMS, RVT

## 2014-10-17 DIAGNOSIS — M546 Pain in thoracic spine: Secondary | ICD-10-CM | POA: Diagnosis not present

## 2014-10-17 DIAGNOSIS — M1711 Unilateral primary osteoarthritis, right knee: Secondary | ICD-10-CM | POA: Diagnosis not present

## 2014-10-17 DIAGNOSIS — M545 Low back pain: Secondary | ICD-10-CM | POA: Diagnosis not present

## 2014-10-17 DIAGNOSIS — M1712 Unilateral primary osteoarthritis, left knee: Secondary | ICD-10-CM | POA: Diagnosis not present

## 2014-10-18 ENCOUNTER — Telehealth (HOSPITAL_COMMUNITY): Payer: Self-pay | Admitting: *Deleted

## 2014-10-24 DIAGNOSIS — M546 Pain in thoracic spine: Secondary | ICD-10-CM | POA: Diagnosis not present

## 2014-10-24 DIAGNOSIS — M545 Low back pain: Secondary | ICD-10-CM | POA: Diagnosis not present

## 2014-11-04 DIAGNOSIS — G8929 Other chronic pain: Secondary | ICD-10-CM | POA: Diagnosis not present

## 2014-11-04 DIAGNOSIS — M62838 Other muscle spasm: Secondary | ICD-10-CM | POA: Diagnosis not present

## 2014-11-04 DIAGNOSIS — M792 Neuralgia and neuritis, unspecified: Secondary | ICD-10-CM | POA: Diagnosis not present

## 2014-11-04 DIAGNOSIS — G90529 Complex regional pain syndrome I of unspecified lower limb: Secondary | ICD-10-CM | POA: Diagnosis not present

## 2014-11-07 DIAGNOSIS — M1711 Unilateral primary osteoarthritis, right knee: Secondary | ICD-10-CM | POA: Diagnosis not present

## 2014-11-07 DIAGNOSIS — M546 Pain in thoracic spine: Secondary | ICD-10-CM | POA: Diagnosis not present

## 2014-11-07 DIAGNOSIS — M5416 Radiculopathy, lumbar region: Secondary | ICD-10-CM | POA: Diagnosis not present

## 2014-11-07 DIAGNOSIS — M1712 Unilateral primary osteoarthritis, left knee: Secondary | ICD-10-CM | POA: Diagnosis not present

## 2014-11-11 DIAGNOSIS — M546 Pain in thoracic spine: Secondary | ICD-10-CM | POA: Diagnosis not present

## 2014-11-11 DIAGNOSIS — M5416 Radiculopathy, lumbar region: Secondary | ICD-10-CM | POA: Diagnosis not present

## 2014-11-11 DIAGNOSIS — M542 Cervicalgia: Secondary | ICD-10-CM | POA: Diagnosis not present

## 2014-11-13 DIAGNOSIS — M4806 Spinal stenosis, lumbar region: Secondary | ICD-10-CM | POA: Diagnosis not present

## 2014-11-18 ENCOUNTER — Encounter: Payer: Self-pay | Admitting: Family Medicine

## 2014-11-18 ENCOUNTER — Ambulatory Visit (INDEPENDENT_AMBULATORY_CARE_PROVIDER_SITE_OTHER): Payer: Medicare Other | Admitting: Family Medicine

## 2014-11-18 VITALS — BP 130/80 | HR 88 | Temp 97.9°F | Wt 234.0 lb

## 2014-11-18 DIAGNOSIS — R1032 Left lower quadrant pain: Secondary | ICD-10-CM | POA: Diagnosis not present

## 2014-11-18 LAB — POCT URINALYSIS DIPSTICK
Bilirubin, UA: NEGATIVE
Glucose, UA: NEGATIVE
KETONES UA: NEGATIVE
Leukocytes, UA: NEGATIVE
Nitrite, UA: NEGATIVE
PH UA: 5.5
Protein, UA: NEGATIVE
RBC UA: NEGATIVE
SPEC GRAV UA: 1.01
UROBILINOGEN UA: 0.2

## 2014-11-18 LAB — CBC WITH DIFFERENTIAL/PLATELET
BASOS PCT: 4 % — AB (ref 0.0–3.0)
Basophils Absolute: 0.3 10*3/uL — ABNORMAL HIGH (ref 0.0–0.1)
EOS ABS: 0.2 10*3/uL (ref 0.0–0.7)
Eosinophils Relative: 1.8 % (ref 0.0–5.0)
HCT: 36.4 % (ref 36.0–46.0)
Hemoglobin: 12.4 g/dL (ref 12.0–15.0)
Lymphocytes Relative: 23.9 % (ref 12.0–46.0)
Lymphs Abs: 2.1 10*3/uL (ref 0.7–4.0)
MCHC: 34 g/dL (ref 30.0–36.0)
MCV: 90.5 fl (ref 78.0–100.0)
Monocytes Absolute: 0.6 10*3/uL (ref 0.1–1.0)
Monocytes Relative: 7.4 % (ref 3.0–12.0)
Neutro Abs: 5.4 10*3/uL (ref 1.4–7.7)
Neutrophils Relative %: 62.9 % (ref 43.0–77.0)
PLATELETS: 317 10*3/uL (ref 150.0–400.0)
RBC: 4.02 Mil/uL (ref 3.87–5.11)
RDW: 14 % (ref 11.5–15.5)
WBC: 8.7 10*3/uL (ref 4.0–10.5)

## 2014-11-18 NOTE — Patient Instructions (Signed)

## 2014-11-18 NOTE — Progress Notes (Signed)
Subjective:    Patient ID: Ashley Castaneda, female    DOB: Jul 29, 1950, 64 y.o.   MRN: [id number]  HPI Patient seen for acute visit. She has complex past medical history with history of obesity, depression, reflex sympathetic dystrophy, trigeminal neuralgia, osteoarthritis, occipital neuralgia, fibromyalgia, history of DVT, history of migraine headaches. She has seen multiple specialists regarding her chronic neuropathy symptoms. She has chronic back pain. She is seen today with 3-4 day history of sharp somewhat intermittent pain left lower quadrant abdomen. No dysuria. No gross hematuria per no flank pain. No fevers or chills. Pain is worse with movement. No alleviating factors.  Colonoscopy 2010 no history of known diverticulosis changes. Current pain is 3-4 out of 10 severity.  Past Medical History  Diagnosis Date  . Unspecified vitamin D deficiency 03/31/2010  . ANXIETY 07/01/2008  . DEPRESSION 07/01/2008  . REFLEX SYMPATHETIC DYSTROPHY 07/01/2008  . Trigeminal neuralgia 05/21/2009  . Other specified trigeminal nerve disorders 07/01/2008  . ALLERGIC RHINITIS 07/01/2008  . BRONCHITIS, CHRONIC 07/01/2008  . GERD 07/01/2008  . ECZEMA 05/29/2009  . OSTEOARTHRITIS 07/01/2008  . OCCIPITAL NEURALGIA 07/01/2008  . BACK PAIN, THORACIC REGION 03/04/2010  . FIBROMYALGIA 07/01/2008  . LEG PAIN, BILATERAL 07/01/2008  . FACIAL PAIN 12/25/2009  . CARPAL TUNNEL SYNDROME, HX OF 07/01/2008  . DVT, HX OF 07/01/2008  . MIGRAINES, HX OF 07/01/2008  . CHRONIC OBSTRUCTIVE PULMONARY DISEASE, ACUTE EXACERBATION 08/07/2010  . Hyperlipidemia    Past Surgical History  Procedure Laterality Date  . Lavh      BSO  . Tonsillectomy  1957  . Right foot bone spur  1987  . Left knee surgery  1989  . Right knee surgery  1990  . Right soulder surgery  [place]  . Head craniectomy  1994    MICROVASULAR DECOMPRESSION  . Right foot surgery  1998    BAD CUT  . Right jaw surgery  2002  . Right thumb sugery  2004  .  Carpal tunnel release  2009    RIGHT  . Occipital nerve stimulator insertion      reports that she quit smoking about 2 years ago. Her smoking use included Cigarettes. She has a 60 pack-year smoking history. She has never used smokeless tobacco. She reports that she does not drink alcohol or use illicit drugs. family history includes Heart attack in her father and maternal grandmother; Hypertension in her father; Ovarian cancer (age of onset: 32) in her mother; Prostate cancer in her father; Stroke in her paternal grandmother. Allergies  Allergen Reactions  . Cefazolin     Ancef - in surgery, swelled up, turned blue, heart problems  . Baclofen     REACTION: confusion  . Carbamazepine     REACTION: confusion  . Ceclor [Cefaclor]     hives  . Fentanyl     REACTION: nausea and vomiting  . Gabapentin     REACTION: confusion  . Oxcarbazepine     REACTION: confusion      Review of Systems  Constitutional: Negative for fever, chills, appetite change and unexpected weight change.  Respiratory: Negative for shortness of breath.   Gastrointestinal: Positive for abdominal pain. Negative for nausea, vomiting and diarrhea.  Genitourinary: Negative for dysuria.  Skin: Negative for rash.  Hematological: Negative for adenopathy.       Objective:   Physical Exam  Constitutional: She appears well-developed and well-nourished.  Cardiovascular: Normal rate and regular rhythm.   Pulmonary/Chest: Effort normal and  breath sounds normal. No respiratory distress. She has no wheezes. She has no rales.  Abdominal: Soft. Bowel sounds are normal. She exhibits no distension and no mass. There is no rebound and no guarding.  Minimal tenderness left lower quadrant to deep palpation. No guarding or rebound.  Neurological: She is alert.  Skin: No rash noted.          Assessment & Plan:  Patient seen with acute issue of left lower quadrant abdominal pain. No history of diverticular disease by  colonoscopy 6 years ago. She does not have any red flags such as appetite or weight change, fever, stool change. Check CBC and urine dipstick. She has benign abdominal exam at this time. Follow-up promptly for any fever, vomiting, stool changes, or any progressive abdominal pain.

## 2014-11-18 NOTE — Progress Notes (Signed)
Pre visit review using our clinic review tool, if applicable. No additional management support is needed unless otherwise documented below in the visit note. 

## 2014-11-21 DIAGNOSIS — M5416 Radiculopathy, lumbar region: Secondary | ICD-10-CM | POA: Diagnosis not present

## 2014-11-26 DIAGNOSIS — R51 Headache: Secondary | ICD-10-CM | POA: Diagnosis not present

## 2014-11-26 DIAGNOSIS — G894 Chronic pain syndrome: Secondary | ICD-10-CM | POA: Diagnosis not present

## 2014-12-02 DIAGNOSIS — H01002 Unspecified blepharitis right lower eyelid: Secondary | ICD-10-CM | POA: Diagnosis not present

## 2014-12-02 DIAGNOSIS — H01005 Unspecified blepharitis left lower eyelid: Secondary | ICD-10-CM | POA: Diagnosis not present

## 2014-12-02 DIAGNOSIS — H01004 Unspecified blepharitis left upper eyelid: Secondary | ICD-10-CM | POA: Diagnosis not present

## 2014-12-02 DIAGNOSIS — H52203 Unspecified astigmatism, bilateral: Secondary | ICD-10-CM | POA: Diagnosis not present

## 2014-12-02 DIAGNOSIS — H2513 Age-related nuclear cataract, bilateral: Secondary | ICD-10-CM | POA: Diagnosis not present

## 2014-12-02 DIAGNOSIS — H524 Presbyopia: Secondary | ICD-10-CM | POA: Diagnosis not present

## 2014-12-02 DIAGNOSIS — H0259 Other disorders affecting eyelid function: Secondary | ICD-10-CM | POA: Diagnosis not present

## 2014-12-02 DIAGNOSIS — H04123 Dry eye syndrome of bilateral lacrimal glands: Secondary | ICD-10-CM | POA: Diagnosis not present

## 2014-12-02 DIAGNOSIS — H01001 Unspecified blepharitis right upper eyelid: Secondary | ICD-10-CM | POA: Diagnosis not present

## 2014-12-02 DIAGNOSIS — H11002 Unspecified pterygium of left eye: Secondary | ICD-10-CM | POA: Diagnosis not present

## 2014-12-02 DIAGNOSIS — H5203 Hypermetropia, bilateral: Secondary | ICD-10-CM | POA: Diagnosis not present

## 2014-12-05 DIAGNOSIS — M5416 Radiculopathy, lumbar region: Secondary | ICD-10-CM | POA: Diagnosis not present

## 2014-12-25 DIAGNOSIS — M5416 Radiculopathy, lumbar region: Secondary | ICD-10-CM | POA: Diagnosis not present

## 2014-12-31 DIAGNOSIS — J029 Acute pharyngitis, unspecified: Secondary | ICD-10-CM | POA: Diagnosis not present

## 2014-12-31 DIAGNOSIS — J309 Allergic rhinitis, unspecified: Secondary | ICD-10-CM | POA: Diagnosis not present

## 2015-01-02 ENCOUNTER — Telehealth: Payer: Self-pay | Admitting: Family Medicine

## 2015-01-02 DIAGNOSIS — R1032 Left lower quadrant pain: Secondary | ICD-10-CM

## 2015-01-02 NOTE — Telephone Encounter (Signed)
IF LLQ pain still persists, would go ahead with CT abd/pelvis with contrast to evaluate.

## 2015-01-02 NOTE — Telephone Encounter (Signed)
Order placed and patient is aware

## 2015-01-02 NOTE — Telephone Encounter (Signed)
Pt was seen in April 2016 for LLQ ABD Pain. Pt stated pain on left side is worst. Pt said md discuss possibly getting abd ct. Please advise

## 2015-01-03 ENCOUNTER — Ambulatory Visit (INDEPENDENT_AMBULATORY_CARE_PROVIDER_SITE_OTHER)
Admission: RE | Admit: 2015-01-03 | Discharge: 2015-01-03 | Disposition: A | Discharge status: Home / Self Care | Payer: Medicare Other | Source: Ambulatory Visit | Attending: Family Medicine | Admitting: Family Medicine

## 2015-01-03 DIAGNOSIS — R1032 Left lower quadrant pain: Secondary | ICD-10-CM

## 2015-01-03 MED ORDER — IOHEXOL 300 MG/ML  SOLN
100.0000 mL | Freq: Once | INTRAMUSCULAR | Status: AC | PRN
  Administered 2015-01-03: 100 mL via INTRAVENOUS

## 2015-01-08 ENCOUNTER — Other Ambulatory Visit: Payer: Medicare Other

## 2015-01-09 DIAGNOSIS — M47816 Spondylosis without myelopathy or radiculopathy, lumbar region: Secondary | ICD-10-CM | POA: Diagnosis not present

## 2015-01-10 ENCOUNTER — Telehealth: Payer: Self-pay | Admitting: Family Medicine

## 2015-01-10 ENCOUNTER — Telehealth: Payer: Self-pay | Admitting: Gastroenterology

## 2015-01-10 NOTE — Telephone Encounter (Signed)
Patient notified of response.  She would like to see Dr. Fuller Plan before she does the colon. She is scheduled for 01/22/15 at 3:45

## 2015-01-10 NOTE — Telephone Encounter (Signed)
Pt would like results of ct scan.

## 2015-01-10 NOTE — Telephone Encounter (Signed)
My next thought would be what she is doing- follow up with GI

## 2015-01-10 NOTE — Telephone Encounter (Signed)
She should have already been called but this was normal- no acute abnormalities.
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
## 2015-01-10 NOTE — Telephone Encounter (Signed)
Pt informed. Pt states that she is still having pain. Pt is going to call GI to make appointment. What else do you feel the patient should do?
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
## 2015-01-10 NOTE — Telephone Encounter (Signed)
Pt informed

## 2015-01-22 ENCOUNTER — Encounter: Payer: Self-pay | Admitting: Gastroenterology

## 2015-01-22 ENCOUNTER — Ambulatory Visit (INDEPENDENT_AMBULATORY_CARE_PROVIDER_SITE_OTHER): Payer: Medicare Other | Admitting: Gastroenterology

## 2015-01-22 VITALS — BP 126/78 | HR 80 | Ht 63.0 in | Wt 241.0 lb

## 2015-01-22 DIAGNOSIS — K5909 Other constipation: Secondary | ICD-10-CM

## 2015-01-22 DIAGNOSIS — R1032 Left lower quadrant pain: Secondary | ICD-10-CM | POA: Diagnosis not present

## 2015-01-22 DIAGNOSIS — Z86010 Personal history of colonic polyps: Secondary | ICD-10-CM | POA: Diagnosis not present

## 2015-01-22 DIAGNOSIS — R194 Change in bowel habit: Secondary | ICD-10-CM | POA: Diagnosis not present

## 2015-01-22 DIAGNOSIS — R198 Other specified symptoms and signs involving the digestive system and abdomen: Secondary | ICD-10-CM

## 2015-01-22 MED ORDER — NA SULFATE-K SULFATE-MG SULF 17.5-3.13-1.6 GM/177ML PO SOLN: 1.0000 | Freq: Once | ORAL | Status: DC

## 2015-01-22 MED ORDER — RABEPRAZOLE SODIUM 20 MG PO TBEC: 20.0000 mg | DELAYED_RELEASE_TABLET | Freq: Two times a day (BID) | ORAL | Status: DC

## 2015-01-22 MED ORDER — LINACLOTIDE 290 MCG PO CAPS: 290.0000 ug | ORAL_CAPSULE | Freq: Every day | ORAL | Status: DC

## 2015-01-22 NOTE — Patient Instructions (Signed)
You have been scheduled for an endoscopy. Please follow written instructions given to you at your visit today. If you use inhalers (even only as needed), please bring them with you on the day of your procedure.  We have sent the following prescriptions to your mail in pharmacy:Aciphex and Linzess.   If you have not heard from your mail in pharmacy within 1 week or if you have not received your medication in the mail, please contact us at [phone] so we may find out why.  Normal BMI (Body Mass Index- based on height and weight) is between 19 and 25. Your BMI today is Body mass index is 42.7 kg/(m^2). Marland Kitchen Please consider follow up  regarding your BMI with your Primary Care Provider.  Thank you for choosing me and [facility].  Pricilla Riffle. Dagoberto Ligas., MD., Marval Regal

## 2015-01-22 NOTE — Progress Notes (Signed)
    History of Present Illness: This is a  64 year old female with alternating constipation and diarrhea. Primarily has constipation but sometimes has diarrhea with laxative use. She is currently taking Linzess as needed, about once per week. She has intermittent left lower quadrant pain unrelated to meals or bowel movements. Her globus sensation has substantially improved. Reviewing prior records from Dr. Watt Climes she is overdue for surveillance colonoscopy for history of adenomatous colon polyps.  Current Medications, Allergies, Past Medical History, Past Surgical History, Family History and Social History were reviewed in [employer] record.  Physical Exam: General: Well developed , well nourished, no acute distress Head: Normocephalic and atraumatic Eyes:  sclerae anicteric, EOMI Ears: Normal auditory acuity Mouth: No deformity or lesions Lungs: Clear throughout to auscultation Heart: Regular rate and rhythm; no murmurs, rubs or bruits Abdomen: Soft and non distended.  Tenderness over anterior left hip. No masses, hepatosplenomegaly or hernias noted. Normal Bowel sounds Rectal:  Her to colonoscopy Musculoskeletal: Symmetrical with no gross deformities  Pulses:  Normal pulses noted Extremities: No clubbing, cyanosis, edema or deformities noted Neurological: Alert oriented x 4, grossly nonfocal Psychological:  Alert and cooperative. Normal mood and affect  Assessment and Recommendations:  1.  Personal history of adenomatous colon polyps. One year overdue for surveillance colonoscopy. Schedule colonoscopy. The risks (including bleeding, perforation, infection, missed lesions, medication reactions and possible hospitalization or surgery if complications occur), benefits, and alternatives to colonoscopy with possible biopsy and possible polypectomy were discussed with the patient and they consent to proceed.   2.  Globus sensation, improved. This may not be related to  chronic GERD. Continue standard antireflux measures and AcipHex 20 mg daily.  3.  Chronic constipation with intermittent diarrhea. Trial of taking Linzess every day (not as needed) and discontinuing all other laxatives unless Linzess daily is not effectve. Will further readjust her laxative regimen if necsessary.  4. LLQ pain. This may be related to her left hip based on physical exam findings.  Follow-up with her orthopedic surgeon.

## 2015-01-29 DIAGNOSIS — M25552 Pain in left hip: Secondary | ICD-10-CM | POA: Diagnosis not present

## 2015-01-29 DIAGNOSIS — M170 Bilateral primary osteoarthritis of knee: Secondary | ICD-10-CM | POA: Diagnosis not present

## 2015-01-30 ENCOUNTER — Telehealth: Payer: Self-pay | Admitting: Family Medicine

## 2015-01-30 ENCOUNTER — Telehealth: Payer: Self-pay | Admitting: Gastroenterology

## 2015-01-30 NOTE — Telephone Encounter (Signed)
Pt informed. Pt states that she will wait to see with her GI doctor recommends.
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
## 2015-01-30 NOTE — Telephone Encounter (Signed)
Patient is wanting a ultrasound.

## 2015-01-30 NOTE — Telephone Encounter (Signed)
Nothing else from GI standpoint. Suspected musculoskeletal or radicular pain. No GI cause has been found. Complete colonoscopy as scheduled,

## 2015-01-30 NOTE — Telephone Encounter (Signed)
With recent CT abdomen and pelvis, I do not think Ultrasound would be very useful this time. If she has any asymmetric leg swelling or localized leg or calf pain recommend follow-up so we can assess for possible DVT.

## 2015-01-30 NOTE — Telephone Encounter (Signed)
Pt was offered appointment and refused at this time.

## 2015-01-30 NOTE — Telephone Encounter (Signed)
Pt states she saw Dr. Fuller Plan and he thought her abdominal pain was being caused from her hip. Pt states she saw her ortho doctor yesterday and he did xrays and examined her and states the pain is not from her hip. Pt is scheduled for colon 02/06/15 and wants to make sure she doesn't need to be doing anything different. Please advise.

## 2015-01-31 NOTE — Telephone Encounter (Signed)
Left message for pt to call back  °

## 2015-02-05 NOTE — Telephone Encounter (Signed)
Patient notified She will keep the appt for colonoscopy tomorrow

## 2015-02-06 ENCOUNTER — Ambulatory Visit (AMBULATORY_SURGERY_CENTER): Payer: Medicare Other | Admitting: Gastroenterology

## 2015-02-06 ENCOUNTER — Encounter: Payer: Self-pay | Admitting: Gastroenterology

## 2015-02-06 VITALS — BP 114/66 | HR 77 | Temp 96.7°F | Resp 24 | Ht 63.0 in | Wt 241.0 lb

## 2015-02-06 DIAGNOSIS — Z86010 Personal history of colonic polyps: Secondary | ICD-10-CM | POA: Diagnosis not present

## 2015-02-06 DIAGNOSIS — M545 Low back pain: Secondary | ICD-10-CM | POA: Diagnosis not present

## 2015-02-06 DIAGNOSIS — M797 Fibromyalgia: Secondary | ICD-10-CM | POA: Diagnosis not present

## 2015-02-06 DIAGNOSIS — R194 Change in bowel habit: Secondary | ICD-10-CM | POA: Diagnosis not present

## 2015-02-06 DIAGNOSIS — K219 Gastro-esophageal reflux disease without esophagitis: Secondary | ICD-10-CM | POA: Diagnosis not present

## 2015-02-06 DIAGNOSIS — J449 Chronic obstructive pulmonary disease, unspecified: Secondary | ICD-10-CM | POA: Diagnosis not present

## 2015-02-06 DIAGNOSIS — F419 Anxiety disorder, unspecified: Secondary | ICD-10-CM | POA: Diagnosis not present

## 2015-02-06 MED ORDER — SODIUM CHLORIDE 0.9 % IV SOLN: 500.0000 mL | INTRAVENOUS | Status: DC

## 2015-02-06 NOTE — Op Note (Addendum)
[place]  [address]. [place], [address]   COLONOSCOPY PROCEDURE REPORT  PATIENT: Ashley, Castaneda  MR#: [id number] BIRTHDATE: 02-Nov-1950 , 64  yrs. old GENDER: female ENDOSCOPIST: Ladene Artist, MD, [facility] PROCEDURE DATE:  02/06/2015 PROCEDURE:   Colonoscopy, surveillance First Screening Colonoscopy - Avg.  risk and is 50 yrs.  old or older - No.  Prior Negative Screening - Now for repeat screening. N/A  History of Adenoma - Now for follow-up colonoscopy & has been > or = to 3 yrs.  Yes hx of adenoma.  Has been 3 or more years since last colonoscopy.  Polyps removed today? No Recommend repeat exam, <10 yrs? No ASA CLASS:   Class III INDICATIONS:Surveillance due to prior colonic neoplasia and PH Colon Adenoma. MEDICATIONS: Monitored anesthesia care and Propofol 200 mg IV DESCRIPTION OF PROCEDURE:   After the risks benefits and alternatives of the procedure were thoroughly explained, informed consent was obtained.  The digital rectal exam revealed no abnormalities of the rectum.   The LB [id number]  endoscope was introduced through the anus and advanced to the cecum, which was identified by both the appendix and ileocecal valve. No adverse events experienced.   The quality of the prep was good.  (Suprep was used)  The instrument was then slowly withdrawn as the colon was fully examined. Estimated blood loss is zero unless otherwise noted in this procedure report.    COLON FINDINGS: Mild melanosis coli was found in the right colon. The examination was otherwise normal.  Retroflexed views revealed internal Grade I hemorrhoids. The time to cecum = 2.1 Withdrawal time = 10.2   The scope was withdrawn and the procedure completed. COMPLICATIONS: There were no immediate complications.  ENDOSCOPIC IMPRESSION: 1.   Mild melanosis coli in the right colon 2.   Grade l internal hemorrhoids  RECOMMENDATIONS: 1. Continue current colorectal screening  recommendations for "routine risk" patients with a repeat colonoscopy in 10 years.  eSigned:  Ladene Artist, MD, [facility] 02/06/2015 11:30 AM Revised: 02/06/2015 11:30 AM

## 2015-02-06 NOTE — Progress Notes (Signed)
Report to PACU, RN, vss, BBS= Clear.  

## 2015-02-06 NOTE — Patient Instructions (Signed)
YOU HAD AN ENDOSCOPIC PROCEDURE TODAY AT [facility]:   Refer to the procedure report that was given to you for any specific questions about what was found during the examination.  If the procedure report does not answer your questions, please call your gastroenterologist to clarify.  If you requested that your care partner not be given the details of your procedure findings, then the procedure report has been included in a sealed envelope for you to review at your convenience later.  YOU SHOULD EXPECT: Some feelings of bloating in the abdomen. Passage of more gas than usual.  Walking can help get rid of the air that was put into your GI tract during the procedure and reduce the bloating. If you had a lower endoscopy (such as a colonoscopy or flexible sigmoidoscopy) you may notice spotting of blood in your stool or on the toilet paper. If you underwent a bowel prep for your procedure, you may not have a normal bowel movement for a few days.  Please Note:  You might notice some irritation and congestion in your nose or some drainage.  This is from the oxygen used during your procedure.  There is no need for concern and it should clear up in a day or so.  SYMPTOMS TO REPORT IMMEDIATELY:   Following lower endoscopy (colonoscopy or flexible sigmoidoscopy):  Excessive amounts of blood in the stool  Significant tenderness or worsening of abdominal pains  Swelling of the abdomen that is new, acute  Fever of 100F or higher   For urgent or emergent issues, a gastroenterologist can be reached at any hour by calling [phone].   DIET: Your first meal following the procedure should be a small meal and then it is ok to progress to your normal diet. Heavy or fried foods are harder to digest and may make you feel nauseous or bloated.  Likewise, meals heavy in dairy and vegetables can increase bloating.  Drink plenty of fluids but you should avoid alcoholic beverages for 24  hours.  ACTIVITY:  You should plan to take it easy for the rest of today and you should NOT DRIVE or use heavy machinery until tomorrow (because of the sedation medicines used during the test).    FOLLOW UP: Our staff will call the number listed on your records the next business day following your procedure to check on you and address any questions or concerns that you may have regarding the information given to you following your procedure. If we do not reach you, we will leave a message.  However, if you are feeling well and you are not experiencing any problems, there is no need to return our call.  We will assume that you have returned to your regular daily activities without incident.  If any biopsies were taken you will be contacted by phone or by letter within the next 1-3 weeks.  Please call us at [phone] if you have not heard about the biopsies in 3 weeks.    SIGNATURES/CONFIDENTIALITY: You and/or your care partner have signed paperwork which will be entered into your electronic medical record.  These signatures attest to the fact that that the information above on your After Visit Summary has been reviewed and is understood.  Full responsibility of the confidentiality of this discharge information lies with you and/or your care-partner.  Repeat Colonoscopy in 10 years Hemorrhoids handout given

## 2015-02-07 ENCOUNTER — Telehealth: Payer: Self-pay | Admitting: Gastroenterology

## 2015-02-07 ENCOUNTER — Telehealth: Payer: Self-pay | Admitting: *Deleted

## 2015-02-07 NOTE — Telephone Encounter (Signed)
No answer. No identifier. Message left to call if questions or concerns. 

## 2015-02-07 NOTE — Telephone Encounter (Signed)
Patient notified she will call back for GI concerns

## 2015-02-07 NOTE — Telephone Encounter (Signed)
Stay on ASA daily Warm compresses frequently for several days and it should gradually go away after 2 weeks If symptoms persist please see your PCP for additional evaluation and advice

## 2015-02-07 NOTE — Telephone Encounter (Signed)
Patient reports that she has a hard knot at the sight. She reports that she was stuck x 4 to get an IV yesterday.   She says she is bruised and tender and red about two inches above the site.  She reports that she has a history of DVT and is very concerned.  She is on a 325 mg ASA daily.  She is recommended to place warm compresses on the arm today.  Dr. Fuller Plan she is very concerned please advise

## 2015-02-11 DIAGNOSIS — M47816 Spondylosis without myelopathy or radiculopathy, lumbar region: Secondary | ICD-10-CM | POA: Diagnosis not present

## 2015-02-24 DIAGNOSIS — M5030 Other cervical disc degeneration, unspecified cervical region: Secondary | ICD-10-CM | POA: Diagnosis not present

## 2015-02-24 DIAGNOSIS — M791 Myalgia: Secondary | ICD-10-CM | POA: Diagnosis not present

## 2015-02-25 DIAGNOSIS — M5030 Other cervical disc degeneration, unspecified cervical region: Secondary | ICD-10-CM | POA: Diagnosis not present

## 2015-02-26 DIAGNOSIS — Z87891 Personal history of nicotine dependence: Secondary | ICD-10-CM | POA: Diagnosis not present

## 2015-02-26 DIAGNOSIS — Z888 Allergy status to other drugs, medicaments and biological substances status: Secondary | ICD-10-CM | POA: Diagnosis not present

## 2015-02-26 DIAGNOSIS — K219 Gastro-esophageal reflux disease without esophagitis: Secondary | ICD-10-CM | POA: Diagnosis not present

## 2015-02-26 DIAGNOSIS — R1032 Left lower quadrant pain: Secondary | ICD-10-CM | POA: Diagnosis not present

## 2015-03-03 DIAGNOSIS — Z1231 Encounter for screening mammogram for malignant neoplasm of breast: Secondary | ICD-10-CM | POA: Diagnosis not present

## 2015-03-05 ENCOUNTER — Telehealth: Payer: Self-pay

## 2015-03-05 NOTE — Telephone Encounter (Signed)
Called pt to see if her IV site issue had resolved. Pt stated that she still has a little tenderness and a small knot at the site in her left arm at the bend of her elbow.  She said "they tried twice there but couldn't get it."  Encouraged pt to continue to use warm compresses and taker her regular anti-inflammatory meds.  Asked her to call for any other concerns.

## 2015-03-07 ENCOUNTER — Encounter: Payer: Self-pay | Admitting: Gynecology

## 2015-03-12 DIAGNOSIS — M170 Bilateral primary osteoarthritis of knee: Secondary | ICD-10-CM | POA: Diagnosis not present

## 2015-03-12 DIAGNOSIS — M76892 Other specified enthesopathies of left lower limb, excluding foot: Secondary | ICD-10-CM | POA: Diagnosis not present

## 2015-03-12 DIAGNOSIS — M5481 Occipital neuralgia: Secondary | ICD-10-CM | POA: Diagnosis not present

## 2015-03-12 DIAGNOSIS — M25552 Pain in left hip: Secondary | ICD-10-CM | POA: Diagnosis not present

## 2015-03-12 DIAGNOSIS — M5416 Radiculopathy, lumbar region: Secondary | ICD-10-CM | POA: Diagnosis not present

## 2015-03-26 DIAGNOSIS — M542 Cervicalgia: Secondary | ICD-10-CM | POA: Diagnosis not present

## 2015-04-03 DIAGNOSIS — M47816 Spondylosis without myelopathy or radiculopathy, lumbar region: Secondary | ICD-10-CM | POA: Diagnosis not present

## 2015-04-18 DIAGNOSIS — H04123 Dry eye syndrome of bilateral lacrimal glands: Secondary | ICD-10-CM | POA: Diagnosis not present

## 2015-04-18 DIAGNOSIS — H11002 Unspecified pterygium of left eye: Secondary | ICD-10-CM | POA: Diagnosis not present

## 2015-04-18 DIAGNOSIS — H01002 Unspecified blepharitis right lower eyelid: Secondary | ICD-10-CM | POA: Diagnosis not present

## 2015-04-18 DIAGNOSIS — H01005 Unspecified blepharitis left lower eyelid: Secondary | ICD-10-CM | POA: Diagnosis not present

## 2015-04-18 DIAGNOSIS — H01004 Unspecified blepharitis left upper eyelid: Secondary | ICD-10-CM | POA: Diagnosis not present

## 2015-04-18 DIAGNOSIS — H01001 Unspecified blepharitis right upper eyelid: Secondary | ICD-10-CM | POA: Diagnosis not present

## 2015-04-24 DIAGNOSIS — M47816 Spondylosis without myelopathy or radiculopathy, lumbar region: Secondary | ICD-10-CM | POA: Diagnosis not present

## 2015-04-28 DIAGNOSIS — M5481 Occipital neuralgia: Secondary | ICD-10-CM | POA: Diagnosis not present

## 2015-04-28 DIAGNOSIS — M5124 Other intervertebral disc displacement, thoracic region: Secondary | ICD-10-CM | POA: Diagnosis not present

## 2015-04-28 DIAGNOSIS — M47816 Spondylosis without myelopathy or radiculopathy, lumbar region: Secondary | ICD-10-CM | POA: Diagnosis not present

## 2015-05-02 ENCOUNTER — Telehealth: Payer: Self-pay | Admitting: Family Medicine

## 2015-05-02 MED ORDER — ALPRAZOLAM 0.5 MG PO TABS: 0.5000 mg | ORAL_TABLET | Freq: Three times a day (TID) | ORAL | Status: DC | PRN

## 2015-05-02 MED ORDER — MONTELUKAST SODIUM 10 MG PO TABS: 10.0000 mg | ORAL_TABLET | Freq: Every day | ORAL | Status: DC

## 2015-05-02 NOTE — Telephone Encounter (Signed)
Refill for 6 months. 

## 2015-05-02 NOTE — Telephone Encounter (Signed)
Rx was fax

## 2015-05-02 NOTE — Telephone Encounter (Signed)
Pt last visit 11/18/14. Last refill 09/09/14 #90 with 5 refills

## 2015-05-02 NOTE — Telephone Encounter (Signed)
Pt needs refills on alprazolam and montelukast #90 each w/refills call into med by mail [phone]

## 2015-05-12 DIAGNOSIS — M47816 Spondylosis without myelopathy or radiculopathy, lumbar region: Secondary | ICD-10-CM | POA: Diagnosis not present

## 2015-05-15 DIAGNOSIS — G9009 Other idiopathic peripheral autonomic neuropathy: Secondary | ICD-10-CM | POA: Diagnosis not present

## 2015-05-15 DIAGNOSIS — M792 Neuralgia and neuritis, unspecified: Secondary | ICD-10-CM | POA: Diagnosis not present

## 2015-05-15 DIAGNOSIS — G500 Trigeminal neuralgia: Secondary | ICD-10-CM | POA: Diagnosis not present

## 2015-05-15 DIAGNOSIS — M791 Myalgia: Secondary | ICD-10-CM | POA: Diagnosis not present

## 2015-05-29 DIAGNOSIS — G609 Hereditary and idiopathic neuropathy, unspecified: Secondary | ICD-10-CM | POA: Diagnosis not present

## 2015-05-29 DIAGNOSIS — G894 Chronic pain syndrome: Secondary | ICD-10-CM | POA: Diagnosis not present

## 2015-05-29 DIAGNOSIS — M4302 Spondylolysis, cervical region: Secondary | ICD-10-CM | POA: Diagnosis not present

## 2015-05-29 DIAGNOSIS — R51 Headache: Secondary | ICD-10-CM | POA: Diagnosis not present

## 2015-05-29 DIAGNOSIS — M791 Myalgia: Secondary | ICD-10-CM | POA: Diagnosis not present

## 2015-06-04 DIAGNOSIS — M792 Neuralgia and neuritis, unspecified: Secondary | ICD-10-CM | POA: Diagnosis not present

## 2015-06-04 DIAGNOSIS — G90529 Complex regional pain syndrome I of unspecified lower limb: Secondary | ICD-10-CM | POA: Diagnosis not present

## 2015-06-04 DIAGNOSIS — M62838 Other muscle spasm: Secondary | ICD-10-CM | POA: Diagnosis not present

## 2015-06-04 DIAGNOSIS — G8929 Other chronic pain: Secondary | ICD-10-CM | POA: Diagnosis not present

## 2015-06-05 DIAGNOSIS — M47816 Spondylosis without myelopathy or radiculopathy, lumbar region: Secondary | ICD-10-CM | POA: Diagnosis not present

## 2015-06-09 DIAGNOSIS — M545 Low back pain: Secondary | ICD-10-CM | POA: Diagnosis not present

## 2015-06-09 DIAGNOSIS — M5124 Other intervertebral disc displacement, thoracic region: Secondary | ICD-10-CM | POA: Diagnosis not present

## 2015-06-09 DIAGNOSIS — M170 Bilateral primary osteoarthritis of knee: Secondary | ICD-10-CM | POA: Diagnosis not present

## 2015-06-09 DIAGNOSIS — M546 Pain in thoracic spine: Secondary | ICD-10-CM | POA: Diagnosis not present

## 2015-06-22 DIAGNOSIS — J0100 Acute maxillary sinusitis, unspecified: Secondary | ICD-10-CM | POA: Diagnosis not present

## 2015-06-26 DIAGNOSIS — M170 Bilateral primary osteoarthritis of knee: Secondary | ICD-10-CM | POA: Diagnosis not present

## 2015-06-26 DIAGNOSIS — M542 Cervicalgia: Secondary | ICD-10-CM | POA: Diagnosis not present

## 2015-06-26 DIAGNOSIS — M546 Pain in thoracic spine: Secondary | ICD-10-CM | POA: Diagnosis not present

## 2015-06-26 DIAGNOSIS — M4302 Spondylolysis, cervical region: Secondary | ICD-10-CM | POA: Diagnosis not present

## 2015-06-30 ENCOUNTER — Telehealth: Payer: Self-pay | Admitting: Family Medicine

## 2015-06-30 DIAGNOSIS — J329 Chronic sinusitis, unspecified: Secondary | ICD-10-CM | POA: Diagnosis not present

## 2015-06-30 DIAGNOSIS — J40 Bronchitis, not specified as acute or chronic: Secondary | ICD-10-CM | POA: Diagnosis not present

## 2015-06-30 DIAGNOSIS — J0100 Acute maxillary sinusitis, unspecified: Secondary | ICD-10-CM | POA: Diagnosis not present

## 2015-06-30 NOTE — Telephone Encounter (Signed)
FYI

## 2015-06-30 NOTE — Telephone Encounter (Signed)
[facility] [facility] - [facility]  Patient Name: Ashley Castaneda  DOB: [birth year]    Initial Comment Caller states she is congested; was given a Z pack and cough meds; not getting better;   Nurse Assessment  Nurse: Wayne Sever, RN, Tillie Rung Date/Time (Eastern Time): 06/30/2015 1:57:28 PM  Confirm and document reason for call. If symptomatic, describe symptoms. ---Caller was given a Z-pak on 11/26 and she finished it. She says since then her husband has gotten sick. She has a history of bronchitis. Caller does not have working thermometer. She is having a cough.  Has the patient traveled out of the country within the last 30 days? ---Not Applicable  Does the patient have any new or worsening symptoms? ---Yes  Will a triage be completed? ---Yes  Related visit to physician within the last 2 weeks? ---Yes  Does the PT have any chronic conditions? (i.e. diabetes, asthma, etc.) ---Yes  List chronic conditions. ---COPD, Allergies, Joint Issues and Nerve Damage  Is this a behavioral health or substance abuse call? ---No     Guidelines    Guideline Title Affirmed Question Affirmed Notes  Sinus Pain or Congestion [1] Sinus congestion (pressure, fullness) AND [2] present > 10 days    Final Disposition User   See PCP When Office is Open (within 3 days) Wayne Sever, RN, Tillie Rung    Comments  Scheduled for appointment on 07/02/15 at 36   Referrals  REFERRED TO PCP OFFICE   Disagree/Comply: Comply

## 2015-06-30 NOTE — Telephone Encounter (Signed)
FYI. Appointment 2 days out.

## 2015-07-02 ENCOUNTER — Ambulatory Visit: Payer: Self-pay | Admitting: Family Medicine

## 2015-07-03 DIAGNOSIS — M170 Bilateral primary osteoarthritis of knee: Secondary | ICD-10-CM | POA: Diagnosis not present

## 2015-07-03 DIAGNOSIS — M5481 Occipital neuralgia: Secondary | ICD-10-CM | POA: Diagnosis not present

## 2015-07-04 DIAGNOSIS — G43719 Chronic migraine without aura, intractable, without status migrainosus: Secondary | ICD-10-CM | POA: Diagnosis not present

## 2015-07-04 DIAGNOSIS — M792 Neuralgia and neuritis, unspecified: Secondary | ICD-10-CM | POA: Diagnosis not present

## 2015-07-04 DIAGNOSIS — G500 Trigeminal neuralgia: Secondary | ICD-10-CM | POA: Diagnosis not present

## 2015-07-04 DIAGNOSIS — M791 Myalgia: Secondary | ICD-10-CM | POA: Diagnosis not present

## 2015-07-08 DIAGNOSIS — M4302 Spondylolysis, cervical region: Secondary | ICD-10-CM | POA: Diagnosis not present

## 2015-07-08 DIAGNOSIS — M542 Cervicalgia: Secondary | ICD-10-CM | POA: Diagnosis not present

## 2015-07-08 DIAGNOSIS — M791 Myalgia: Secondary | ICD-10-CM | POA: Diagnosis not present

## 2015-07-08 DIAGNOSIS — M546 Pain in thoracic spine: Secondary | ICD-10-CM | POA: Diagnosis not present

## 2015-07-08 DIAGNOSIS — M47816 Spondylosis without myelopathy or radiculopathy, lumbar region: Secondary | ICD-10-CM | POA: Diagnosis not present

## 2015-07-09 DIAGNOSIS — M47816 Spondylosis without myelopathy or radiculopathy, lumbar region: Secondary | ICD-10-CM | POA: Diagnosis not present

## 2015-07-09 DIAGNOSIS — M791 Myalgia: Secondary | ICD-10-CM | POA: Diagnosis not present

## 2015-07-10 DIAGNOSIS — M1711 Unilateral primary osteoarthritis, right knee: Secondary | ICD-10-CM | POA: Diagnosis not present

## 2015-07-10 DIAGNOSIS — M542 Cervicalgia: Secondary | ICD-10-CM | POA: Diagnosis not present

## 2015-07-10 DIAGNOSIS — M1712 Unilateral primary osteoarthritis, left knee: Secondary | ICD-10-CM | POA: Diagnosis not present

## 2015-07-14 DIAGNOSIS — M47816 Spondylosis without myelopathy or radiculopathy, lumbar region: Secondary | ICD-10-CM | POA: Diagnosis not present

## 2015-07-15 DIAGNOSIS — M47816 Spondylosis without myelopathy or radiculopathy, lumbar region: Secondary | ICD-10-CM | POA: Diagnosis not present

## 2015-07-17 DIAGNOSIS — M170 Bilateral primary osteoarthritis of knee: Secondary | ICD-10-CM | POA: Diagnosis not present

## 2015-07-17 DIAGNOSIS — M47816 Spondylosis without myelopathy or radiculopathy, lumbar region: Secondary | ICD-10-CM | POA: Diagnosis not present

## 2015-07-25 DIAGNOSIS — J209 Acute bronchitis, unspecified: Secondary | ICD-10-CM | POA: Diagnosis not present

## 2015-07-25 DIAGNOSIS — J0100 Acute maxillary sinusitis, unspecified: Secondary | ICD-10-CM | POA: Diagnosis not present

## 2015-08-01 DIAGNOSIS — M791 Myalgia: Secondary | ICD-10-CM | POA: Diagnosis not present

## 2015-08-01 DIAGNOSIS — G43719 Chronic migraine without aura, intractable, without status migrainosus: Secondary | ICD-10-CM | POA: Diagnosis not present

## 2015-08-01 DIAGNOSIS — M792 Neuralgia and neuritis, unspecified: Secondary | ICD-10-CM | POA: Diagnosis not present

## 2015-08-01 DIAGNOSIS — G90529 Complex regional pain syndrome I of unspecified lower limb: Secondary | ICD-10-CM | POA: Diagnosis not present

## 2015-08-11 DIAGNOSIS — M47816 Spondylosis without myelopathy or radiculopathy, lumbar region: Secondary | ICD-10-CM | POA: Diagnosis not present

## 2015-08-11 DIAGNOSIS — M5134 Other intervertebral disc degeneration, thoracic region: Secondary | ICD-10-CM | POA: Diagnosis not present

## 2015-08-26 DIAGNOSIS — R42 Dizziness and giddiness: Secondary | ICD-10-CM | POA: Diagnosis not present

## 2015-08-26 DIAGNOSIS — Z7982 Long term (current) use of aspirin: Secondary | ICD-10-CM | POA: Diagnosis not present

## 2015-08-26 DIAGNOSIS — Z79899 Other long term (current) drug therapy: Secondary | ICD-10-CM | POA: Diagnosis not present

## 2015-08-26 DIAGNOSIS — R002 Palpitations: Secondary | ICD-10-CM | POA: Diagnosis not present

## 2015-08-26 DIAGNOSIS — G629 Polyneuropathy, unspecified: Secondary | ICD-10-CM | POA: Diagnosis not present

## 2015-08-28 DIAGNOSIS — M5134 Other intervertebral disc degeneration, thoracic region: Secondary | ICD-10-CM | POA: Diagnosis not present

## 2015-09-04 DIAGNOSIS — G43719 Chronic migraine without aura, intractable, without status migrainosus: Secondary | ICD-10-CM | POA: Diagnosis not present

## 2015-09-04 DIAGNOSIS — M791 Myalgia: Secondary | ICD-10-CM | POA: Diagnosis not present

## 2015-09-04 DIAGNOSIS — G90529 Complex regional pain syndrome I of unspecified lower limb: Secondary | ICD-10-CM | POA: Diagnosis not present

## 2015-09-04 DIAGNOSIS — M792 Neuralgia and neuritis, unspecified: Secondary | ICD-10-CM | POA: Diagnosis not present

## 2015-09-13 DIAGNOSIS — Z23 Encounter for immunization: Secondary | ICD-10-CM | POA: Diagnosis not present

## 2015-09-22 ENCOUNTER — Other Ambulatory Visit: Payer: Self-pay | Admitting: Physical Medicine and Rehabilitation

## 2015-09-22 ENCOUNTER — Other Ambulatory Visit (HOSPITAL_COMMUNITY): Payer: Self-pay | Admitting: Physical Medicine and Rehabilitation

## 2015-09-22 ENCOUNTER — Ambulatory Visit (HOSPITAL_COMMUNITY)
Admission: RE | Admit: 2015-09-22 | Discharge: 2015-09-22 | Disposition: A | Discharge status: Home / Self Care | Payer: Medicare Other | Source: Ambulatory Visit | Attending: Physical Medicine and Rehabilitation | Admitting: Physical Medicine and Rehabilitation

## 2015-09-22 DIAGNOSIS — M79604 Pain in right leg: Secondary | ICD-10-CM

## 2015-09-22 DIAGNOSIS — M79605 Pain in left leg: Secondary | ICD-10-CM | POA: Diagnosis not present

## 2015-09-22 DIAGNOSIS — R609 Edema, unspecified: Secondary | ICD-10-CM | POA: Insufficient documentation

## 2015-09-22 DIAGNOSIS — M47816 Spondylosis without myelopathy or radiculopathy, lumbar region: Secondary | ICD-10-CM | POA: Diagnosis not present

## 2015-09-22 NOTE — Progress Notes (Signed)
VASCULAR LAB PRELIMINARY  PRELIMINARY  PRELIMINARY  PRELIMINARY  Bilateral lower extremity venous duplex completed.    Preliminary report:  Bilateral:  No evidence of DVT, superficial thrombosis, or Baker's Cyst.   Odesser Tourangeau, RVS 09/22/2015, 6:10 PM

## 2015-09-23 ENCOUNTER — Encounter: Payer: Self-pay | Admitting: Internal Medicine

## 2015-09-23 ENCOUNTER — Ambulatory Visit (INDEPENDENT_AMBULATORY_CARE_PROVIDER_SITE_OTHER): Payer: Medicare Other | Admitting: Internal Medicine

## 2015-09-23 VITALS — BP 118/70 | HR 90 | Temp 98.2°F | Resp 20 | Ht 63.0 in | Wt 264.0 lb

## 2015-09-23 DIAGNOSIS — F411 Generalized anxiety disorder: Secondary | ICD-10-CM | POA: Diagnosis not present

## 2015-09-23 DIAGNOSIS — I82413 Acute embolism and thrombosis of femoral vein, bilateral: Secondary | ICD-10-CM | POA: Diagnosis not present

## 2015-09-23 NOTE — Progress Notes (Signed)
Pre visit review using our clinic review tool, if applicable. No additional management support is needed unless otherwise documented below in the visit note. 

## 2015-09-23 NOTE — Patient Instructions (Signed)
Low-Sodium Eating Plan Sodium raises blood pressure and causes water to be held in the body. Getting less sodium from food will help lower your blood pressure, reduce any swelling, and protect your heart, liver, and kidneys. We get sodium by adding salt (sodium chloride) to food. Most of our sodium comes from canned, boxed, and frozen foods. Restaurant foods, fast foods, and pizza are also very high in sodium. Even if you take medicine to lower your blood pressure or to reduce fluid in your body, getting less sodium from your food is important. WHAT IS MY PLAN? Most people should limit their sodium intake to 2,300 mg a day. Your health care provider recommends that you limit your sodium intake to __________ a day.  WHAT DO I NEED TO KNOW ABOUT THIS EATING PLAN? For the low-sodium eating plan, you will follow these general guidelines:  Choose foods with a % Daily Value for sodium of less than 5% (as listed on the food label).   Use salt-free seasonings or herbs instead of table salt or sea salt.   Check with your health care provider or pharmacist before using salt substitutes.   Eat fresh foods.  Eat more vegetables and fruits.  Limit canned vegetables. If you do use them, rinse them well to decrease the sodium.   Limit cheese to 1 oz (28 g) per day.   Eat lower-sodium products, often labeled as "lower sodium" or "no salt added."  Avoid foods that contain monosodium glutamate (MSG). MSG is sometimes added to [place] food and some canned foods.  Check food labels (Nutrition Facts labels) on foods to learn how much sodium is in one serving.  Eat more home-cooked food and less restaurant, buffet, and fast food.  When eating at a restaurant, ask that your food be prepared with less salt, or no salt if possible.  HOW DO I READ FOOD LABELS FOR SODIUM INFORMATION? The Nutrition Facts label lists the amount of sodium in one serving of the food. If you eat more than one serving, you  must multiply the listed amount of sodium by the number of servings. Food labels may also identify foods as:  Sodium free--Less than 5 mg in a serving.  Very low sodium--35 mg or less in a serving.  Low sodium--140 mg or less in a serving.  Light in sodium--50% less sodium in a serving. For example, if a food that usually has 300 mg of sodium is changed to become light in sodium, it will have 150 mg of sodium.  Reduced sodium--25% less sodium in a serving. For example, if a food that usually has 400 mg of sodium is changed to reduced sodium, it will have 300 mg of sodium. WHAT FOODS CAN I EAT? Grains Low-sodium cereals, including oats, puffed wheat and rice, and shredded wheat cereals. Low-sodium crackers. Unsalted rice and pasta. Lower-sodium bread.  Vegetables Frozen or fresh vegetables. Low-sodium or reduced-sodium canned vegetables. Low-sodium or reduced-sodium tomato sauce and paste. Low-sodium or reduced-sodium tomato and vegetable juices.  Fruits Fresh, frozen, and canned fruit. Fruit juice.  Meat and Other Protein Products Low-sodium canned tuna and salmon. Fresh or frozen meat, poultry, seafood, and fish. Lamb. Unsalted nuts. Dried beans, peas, and lentils without added salt. Unsalted canned beans. Homemade soups without salt. Eggs.  Dairy Milk. Soy milk. Ricotta cheese. Low-sodium or reduced-sodium cheeses. Yogurt.  Condiments Fresh and dried herbs and spices. Salt-free seasonings. Onion and garlic powders. Low-sodium varieties of mustard and ketchup. Fresh or refrigerated horseradish. [place]  juice.  Fats and Oils Reduced-sodium salad dressings. Unsalted butter.  Other Unsalted popcorn and pretzels.  The items listed above may not be a complete list of recommended foods or beverages. Contact your dietitian for more options. WHAT FOODS ARE NOT RECOMMENDED? Grains Instant hot cereals. Bread stuffing, pancake, and biscuit mixes. Croutons. Seasoned rice or pasta mixes.  Noodle soup cups. Boxed or frozen macaroni and cheese. Self-rising flour. Regular salted crackers. Vegetables Regular canned vegetables. Regular canned tomato sauce and paste. Regular tomato and vegetable juices. Frozen vegetables in sauces. Salted [place] fries. Olives. Ashley Castaneda. Relishes. Sauerkraut. Salsa. Meat and Other Protein Products Salted, canned, smoked, spiced, or pickled meats, seafood, or fish. Bacon, ham, sausage, hot dogs, corned beef, chipped beef, and packaged luncheon meats. Salt pork. Jerky. Pickled herring. Anchovies, regular canned tuna, and sardines. Salted nuts. Dairy Processed cheese and cheese spreads. Cheese curds. Blue cheese and cottage cheese. Buttermilk.  Condiments Onion and garlic salt, seasoned salt, table salt, and sea salt. Canned and packaged gravies. Worcestershire sauce. Tartar sauce. Barbecue sauce. Teriyaki sauce. Soy sauce, including reduced sodium. Steak sauce. Fish sauce. Oyster sauce. Cocktail sauce. Horseradish that you find on the shelf. Regular ketchup and mustard. Meat flavorings and tenderizers. Bouillon cubes. Hot sauce. Tabasco sauce. Marinades. Taco seasonings. Relishes. Fats and Oils Regular salad dressings. Salted butter. Margarine. Ghee. Bacon fat.  Other Potato and tortilla chips. Corn chips and puffs. Salted popcorn and pretzels. Canned or dried soups. Pizza. Frozen entrees and pot pies.  The items listed above may not be a complete list of foods and beverages to avoid. Contact your dietitian for more information.   This information is not intended to replace advice given to you by your health care provider. Make sure you discuss any questions you have with your health care provider.   Document Released: 01/01/2002 Document Revised: 08/02/2014 Document Reviewed: 05/16/2013 Elsevier Interactive Patient Education 2016 Else[employer] Venous Stasis or Chronic Venous Insufficiency Chronic venous insufficiency, also called venous stasis, is  a condition that affects the veins in the legs. The condition prevents blood from being pumped through these veins effectively. Blood may no longer be pumped effectively from the legs back to the heart. This condition can range from mild to severe. With proper treatment, you should be able to continue with an active life. CAUSES  Chronic venous insufficiency occurs when the vein walls become stretched, weakened, or damaged or when valves within the vein are damaged. Some common causes of this include:  High blood pressure inside the veins (venous hypertension).  Increased blood pressure in the leg veins from long periods of sitting or standing.  A blood clot that blocks blood flow in a vein (deep vein thrombosis).  Inflammation of a superficial vein (phlebitis) that causes a blood clot to form. RISK FACTORS Various things can make you more likely to develop chronic venous insufficiency, including:  Family history of this condition.  Obesity.  Pregnancy.  Sedentary lifestyle.  Smoking.  Jobs requiring long periods of standing or sitting in one place.  Being a certain age. Women in their 35s and 58s and men in their 35s are more likely to develop this condition. SIGNS AND SYMPTOMS  Symptoms may include:   Varicose veins.  Skin breakdown or ulcers.  Reddened or discolored skin on the leg.  Brown, smooth, tight, and painful skin just above the ankle, usually on the inside surface (lipodermatosclerosis).  Swelling. DIAGNOSIS  To diagnose this condition, your health care provider will take a medical  history and do a physical exam. The following tests may be ordered to confirm the diagnosis:  Duplex ultrasound--A procedure that produces a picture of a blood vessel and nearby organs and also provides information on blood flow through the blood vessel.  Plethysmography--A procedure that tests blood flow.  A venogram, or venography--A procedure used to look at the veins using  X-ray and dye. TREATMENT The goals of treatment are to help you return to an active life and to minimize pain or disability. Treatment will depend on the severity of the condition. Medical procedures may be needed for severe cases. Treatment options may include:   Use of compression stockings. These can help with symptoms and lower the chances of the problem getting worse, but they do not cure the problem.  Sclerotherapy--A procedure involving an injection of a material that "dissolves" the damaged veins. Other veins in the network of blood vessels take over the function of the damaged veins.  Surgery to remove the vein or cut off blood flow through the vein (vein stripping or laser ablation surgery).  Surgery to repair a valve. HOME CARE INSTRUCTIONS   Wear compression stockings as directed by your health care provider.  Only take over-the-counter or prescription medicines for pain, discomfort, or fever as directed by your health care provider.  Follow up with your health care provider as directed. SEEK MEDICAL CARE IF:   You have redness, swelling, or increasing pain in the affected area.  You see a red streak or line that extends up or down from the affected area.  You have a breakdown or loss of skin in the affected area, even if the breakdown is small.  You have an injury to the affected area. SEEK IMMEDIATE MEDICAL CARE IF:   You have an injury and open wound in the affected area.  Your pain is severe and does not improve with medicine.  You have sudden numbness or weakness in the foot or ankle below the affected area, or you have trouble moving your foot or ankle.  You have a fever or persistent symptoms for more than 2-3 days.  You have a fever and your symptoms suddenly get worse. MAKE SURE YOU:   Understand these instructions.  Will watch your condition.  Will get help right away if you are not doing well or get worse.   This information is not intended to  replace advice given to you by your health care provider. Make sure you discuss any questions you have with your health care provider.   Document Released: 11/15/2006 Document Revised: 05/02/2013 Document Reviewed: 03/19/2013 Elsevier Interactive Patient Education [employer].

## 2015-09-23 NOTE — Progress Notes (Signed)
Subjective:    Patient ID: Ashley Castaneda, female    DOB: [birth year], 65 y.o.   MRN: [id number]  HPI  65 year old patient who presents with a chief complaint of worsening bilateral leg edema.  She was seen by her orthopedic physician yesterday and lower extremity venous Doppler studies were performed and revealed no evidence of DVT.  Patient apparently has a history of DVT as well as lower extremity cellulitis. She has been on low-dose intermittent furosemide. She is followed by multiple physicians including orthopedics, GI, cardiology, renal medicine and neurology.  She has been seen by a [facility] neurologist for her headaches and is scheduled to see Dr. Posey Pronto in 2 days.  She has a history of small fiber peripheral neuropathy as well as RSD. Denies any recent increased salt intake.  She has been on the road to [facility] due to her husband's illness.  He has ALS and she has made several trips to [place] due to his illness last week No pulmonary complaints.  No chest pain  Past Medical History  Diagnosis Date  . Unspecified vitamin D deficiency 03/31/2010  . ANXIETY 07/01/2008  . DEPRESSION 07/01/2008  . REFLEX SYMPATHETIC DYSTROPHY 07/01/2008  . Trigeminal neuralgia 05/21/2009  . Other specified trigeminal nerve disorders 07/01/2008  . ALLERGIC RHINITIS 07/01/2008  . BRONCHITIS, CHRONIC 07/01/2008  . GERD 07/01/2008  . ECZEMA 05/29/2009  . OSTEOARTHRITIS 07/01/2008  . OCCIPITAL NEURALGIA 07/01/2008  . BACK PAIN, THORACIC REGION 03/04/2010  . FIBROMYALGIA 07/01/2008  . LEG PAIN, BILATERAL 07/01/2008  . FACIAL PAIN 12/25/2009  . CARPAL TUNNEL SYNDROME, HX OF 07/01/2008  . DVT, HX OF 07/01/2008  . MIGRAINES, HX OF 07/01/2008  . CHRONIC OBSTRUCTIVE PULMONARY DISEASE, ACUTE EXACERBATION 08/07/2010  . Hyperlipidemia   . Adenomatous colon polyp 09/2008  . Hiatal hernia     Social History   Social History  . Marital Status: Married    Spouse Name: [name]  . Number of Children: 0  . Years of Education:  college-3   Occupational History  . disabled    Social History Main Topics  . Smoking status: Former Smoker -- 2.00 packs/day for 30 years    Types: Cigarettes    Quit date: 02/04/2012  . Smokeless tobacco: Never Used  . Alcohol Use: No     Comment: quit 2000  . Drug Use: No  . Sexual Activity: No   Other Topics Concern  . Not on file   Social History Narrative   Patient lives at home with husband [name].    Patient has no children and 2 step children.    Patient is on disability.    Patient has 3 years of college.    Patient is right handed.     Past Surgical History  Procedure Laterality Date  . [place]      BSO  . Tonsillectomy  1957  . Right foot bone spur  1987  . Left knee surgery  1989  . Right knee surgery  1990  . Right soulder surgery  [place]  . Head craniectomy  1994    MICROVASULAR DECOMPRESSION  . Right foot surgery  1998    BAD CUT  . Right jaw surgery  2002  . Right thumb sugery  2004  . Carpal tunnel release  2009    RIGHT  . Occipital nerve stimulator insertion      Family History  Problem Relation Age of Onset  . Ovarian cancer Mother 50  . Hypertension  Father   . Prostate cancer Father   . Heart attack Father   . Heart attack Maternal Grandmother   . Stroke Paternal Grandmother     Allergies  Allergen Reactions  . Cefazolin     Ancef - in surgery, swelled up, turned blue, heart problems  . Baclofen     REACTION: confusion  . Carbamazepine     REACTION: confusion  . Ceclor [Cefaclor]     hives  . Fentanyl     REACTION: nausea and vomiting  . Gabapentin     REACTION: confusion  . Oxcarbazepine     REACTION: confusion    Current Outpatient Prescriptions on File Prior to Visit  Medication Sig Dispense Refill  . ALPRAZolam (XANAX) 0.5 MG tablet Take 1 tablet (0.5 mg total) by mouth 3 (three) times daily as needed for anxiety. 90 tablet 5  . calcium carbonate (OS-CAL - DOSED IN MG OF ELEMENTAL CALCIUM) 1250 MG tablet  Take 1 tablet by mouth daily.    . carboxymethylcellulose (REFRESH PLUS) 0.5 % SOLN Take 1 drop by mouth daily as needed. For dry eyes    . cetirizine (ZYRTEC) 10 MG tablet Take 10 mg by mouth daily.      . Cinnamon 500 MG capsule Take 1,000 mg by mouth daily.     . cycloSPORINE (RESTASIS) 0.05 % ophthalmic emulsion Place 1 drop into both eyes 2 (two) times daily.    . frovatriptan (FROVA) 2.5 MG tablet Take 2.5 mg by mouth as needed. If recurs, may repeat after 2 hours. Max of 3 tabs in 24 hours. For migraines.    . furosemide (LASIX) 20 MG tablet Take 1 tablet (20 mg total) by mouth daily as needed for fluid. For swelling 90 tablet 3  . hydrOXYzine (ATARAX) 10 MG tablet Take 10 mg by mouth every 8 (eight) hours as needed. For dizziness per Renaldo Reel, MD    . lidocaine (LIDODERM) 5 % Place 1 patch onto the skin daily. Remove & Discard patch within 12 hours or as directed by Renaldo Reel MD    . Linaclotide (LINZESS) 290 MCG CAPS capsule Take 1 capsule (290 mcg total) by mouth daily. 90 capsule 3  . meclizine (ANTIVERT) 25 MG tablet TAKE 1 TABLET BY MOUTH 4 TIMES A DAY AS NEEDED FOR DIZZINESS 120 tablet 1  . meloxicam (MOBIC) 15 MG tablet Take 1 tablet (15 mg total) by mouth daily. 90 tablet 1  . metaxalone (SKELAXIN) 800 MG tablet Take 800 mg by mouth 3 (three) times daily.      . metoclopramide (REGLAN) 10 MG tablet Take 10 mg by mouth 3 (three) times daily as needed. For nausea    . montelukast (SINGULAIR) 10 MG tablet Take 1 tablet (10 mg total) by mouth daily at 6 PM. 90 tablet 3  . Multiple Vitamin (MULTIVITAMIN) tablet Take 1 tablet by mouth every morning.     . Omega-3 Fatty Acids (FISH OIL) 1200 MG CAPS Take 1 capsule by mouth every evening.    . potassium chloride (K-DUR) 10 MEQ tablet Take 1 tablet (10 mEq total) by mouth daily as needed. 90 tablet 3  . RABEprazole (ACIPHEX) 20 MG tablet Take 1 tablet (20 mg total) by mouth 2 (two) times daily. 180 tablet 3  . tiZANidine (ZANAFLEX) 2 MG  tablet Take 2 mg by mouth at bedtime.    . vitamin E 400 UNIT capsule Take 400 Units by mouth daily at 6 PM.     .  Cyanocobalamin (VITAMIN B-12 PO) Take 1,000 mcg by mouth as needed. Reported on 09/23/2015    . fluticasone (FLONASE) 50 MCG/ACT nasal spray Place 2 sprays into the nose daily as needed. For nasal congestion 16 g 3  . FOLIC ACID PO Take [id number] mcg by mouth as needed. Reported on 09/23/2015    . UNABLE TO FIND Reported on 09/23/2015     Current Facility-Administered Medications on File Prior to Visit  Medication Dose Route Frequency Provider Last Rate Last Dose  . valproate (DEPACON) 1,500 mg in sodium chloride 0.9 % 100 mL IVPB  1,500 mg Intravenous Continuous Kathrynn Ducking, MD 115 mL/hr at 01/02/14 1215 1,500 mg at 01/02/14 1215    BP 118/70 mmHg  Pulse 90  Temp(Src) 98.2 F (36.8 C) (Oral)  Resp 20  Ht 5\' 3"  (1.6 m)  Wt 264 lb (119.75 kg)  BMI 46.78 kg/m2  SpO2 96%  LMP 07/26/1992     Review of Systems  Constitutional: Negative.   HENT: Negative for congestion, dental problem, hearing loss, rhinorrhea, sinus pressure, sore throat and tinnitus.   Eyes: Negative for pain, discharge and visual disturbance.  Respiratory: Negative for cough and shortness of breath.   Cardiovascular: Positive for leg swelling. Negative for chest pain and palpitations.  Gastrointestinal: Negative for nausea, vomiting, abdominal pain, diarrhea, constipation, blood in stool and abdominal distention.  Genitourinary: Negative for dysuria, urgency, frequency, hematuria, flank pain, vaginal bleeding, vaginal discharge, difficulty urinating, vaginal pain and pelvic pain.  Musculoskeletal: Negative for joint swelling, arthralgias and gait problem.  Skin: Positive for rash.  Neurological: Negative for dizziness, syncope, speech difficulty, weakness, numbness and headaches.  Hematological: Negative for adenopathy.  Psychiatric/Behavioral: Negative for behavioral problems, dysphoric mood and  agitation. The patient is not nervous/anxious.        Objective:   Physical Exam  Constitutional: She appears well-developed and well-nourished. No distress.  Blood pressure 118/70 Weight 264  Cardiovascular: Normal rate and regular rhythm.   Pulmonary/Chest: Effort normal and breath sounds normal.  Musculoskeletal: She exhibits edema.  Prominent lower extremity edema distal to the knees, slightly more prominent on the right Some mild stasis changes with slight excessive warmth and erythema involving the right lateral ankle area No frank cellulitis          Assessment & Plan:   Lower extremity edema.  Will encourage a salt restricted diet.  Information dispensed concerning a low-salt diet.  The patient will attempt to keep her legs elevated.  For the short-term will increase furosemide to 40 mg in the morning and 20 mg after lunch.  Compression hose.  Discussed.  She states that she has tried this in the past, but due to her neuropathy is too uncomfortable.  Has been asked to follow with PCP in 2 weeks with lab Will down titrate furosemide as peripheral edema improves

## 2015-09-24 ENCOUNTER — Ambulatory Visit: Payer: Medicare Other | Admitting: Family Medicine

## 2015-09-25 ENCOUNTER — Ambulatory Visit (INDEPENDENT_AMBULATORY_CARE_PROVIDER_SITE_OTHER): Payer: Medicare Other | Admitting: Neurology

## 2015-09-25 ENCOUNTER — Other Ambulatory Visit: Payer: Medicare Other

## 2015-09-25 ENCOUNTER — Encounter: Payer: Self-pay | Admitting: Neurology

## 2015-09-25 ENCOUNTER — Telehealth: Payer: Self-pay | Admitting: Family Medicine

## 2015-09-25 VITALS — BP 110/70 | HR 100 | Ht 63.0 in | Wt 260.2 lb

## 2015-09-25 DIAGNOSIS — G629 Polyneuropathy, unspecified: Secondary | ICD-10-CM

## 2015-09-25 DIAGNOSIS — G609 Hereditary and idiopathic neuropathy, unspecified: Secondary | ICD-10-CM | POA: Diagnosis not present

## 2015-09-25 DIAGNOSIS — G894 Chronic pain syndrome: Secondary | ICD-10-CM | POA: Diagnosis not present

## 2015-09-25 DIAGNOSIS — R609 Edema, unspecified: Secondary | ICD-10-CM | POA: Diagnosis not present

## 2015-09-25 NOTE — Patient Instructions (Addendum)
Check celiac panel.  We will call you with the results. Follow-up with your pain management provider for pain Return to clinic in 1 year

## 2015-09-25 NOTE — Telephone Encounter (Signed)
[facility] - Client [place]  Patient Name: Ashley Castaneda  DOB: 1951-07-15    Initial Comment Caller states her legs are swollen severely. She increased her medication per her doctor and was told they should be better today. They are not any better. Feels like they are going to crack open she is in severe pain.    Nurse Assessment  Nurse: Justine Null, RN, Rodena Piety Date/Time Eilene Ghazi Time): 09/25/2015 4:13:07 PM  Confirm and document reason for call. If symptomatic, describe symptoms. You must click the next button to save text entered. ---Caller states her legs are swollen severely. She increased her medication per her doctor and was told they should be better today. They are not any better. Feels like they are going to crack open she is in severe pain. caller stated that she has gone to the office this week and has now gone to the UC  Has the patient traveled out of the country within the last 30 days? ---Not Applicable  Does the patient have any new or worsening symptoms? ---Yes  Will a triage be completed? ---No  Select reason for no triage. ---Other  Please document clinical information provided and list any resource used. ---caller stated that she needed some help and she was in the UC checking in at present when the triager reached her No breathing difficulties     Guidelines    Guideline Title Affirmed Question Affirmed Notes       Final Disposition User
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
## 2015-09-26 LAB — GLIADIN ANTIBODIES, SERUM
GLIADIN IGA: 6 U (ref <20)
GLIADIN IGG: 2 U (ref <20)

## 2015-09-26 LAB — TISSUE TRANSGLUTAMINASE, IGA: Tissue Transglutaminase Ab, IgA: 1 U/mL (ref <4)

## 2015-09-26 NOTE — Progress Notes (Signed)
Note routed

## 2015-09-27 LAB — RETICULIN ANTIBODIES, IGA W TITER: RETICULIN AB, IGA: NEGATIVE

## 2015-09-29 ENCOUNTER — Telehealth: Payer: Self-pay | Admitting: Family Medicine

## 2015-09-29 ENCOUNTER — Encounter: Payer: Self-pay | Admitting: *Deleted

## 2015-09-29 NOTE — Telephone Encounter (Signed)
Pt went to Urgent Care and notes are in your review folder. Negative DVT and normal labs.

## 2015-09-29 NOTE — Telephone Encounter (Signed)
Only office note was faxed to use. Do not see any results. Will keep a look out.

## 2015-09-29 NOTE — Telephone Encounter (Signed)
Pt went to [facility] [address] phone [phone] . Pt had blood work done and the results was fax to [place]. Pt would like to know the results. Pt leg is still swollen

## 2015-09-29 NOTE — Telephone Encounter (Signed)
She needs to contact Urgent Care where tests were done for results.  We are happy to see her if edema persists.

## 2015-09-30 NOTE — Telephone Encounter (Signed)
Left message for pt to call back. Please schedule pt a follow up on her Edema to discuss other options.

## 2015-09-30 NOTE — Telephone Encounter (Signed)
Pt has an appt tomorrow °

## 2015-10-01 ENCOUNTER — Encounter: Payer: Self-pay | Admitting: Family Medicine

## 2015-10-01 ENCOUNTER — Ambulatory Visit (INDEPENDENT_AMBULATORY_CARE_PROVIDER_SITE_OTHER): Payer: Medicare Other | Admitting: Family Medicine

## 2015-10-01 VITALS — BP 122/80 | HR 107 | Temp 98.4°F | Ht 63.0 in | Wt 260.3 lb

## 2015-10-01 DIAGNOSIS — R600 Localized edema: Secondary | ICD-10-CM

## 2015-10-01 LAB — COMPREHENSIVE METABOLIC PANEL
ALBUMIN: 4.3 g/dL (ref 3.5–5.2)
ALK PHOS: 91 U/L (ref 39–117)
ALT: 38 U/L — ABNORMAL HIGH (ref 0–35)
AST: 33 U/L (ref 0–37)
BUN: 18 mg/dL (ref 6–23)
CO2: 25 meq/L (ref 19–32)
Calcium: 9.2 mg/dL (ref 8.4–10.5)
Chloride: 103 mEq/L (ref 96–112)
Creatinine, Ser: 0.87 mg/dL (ref 0.40–1.20)
GFR: 69.73 mL/min (ref >60.00)
Glucose, Bld: 100 mg/dL — ABNORMAL HIGH (ref 70–99)
POTASSIUM: 4.1 meq/L (ref 3.5–5.1)
SODIUM: 138 meq/L (ref 135–145)
Total Bilirubin: 0.3 mg/dL (ref 0.2–1.2)
Total Protein: 6.9 g/dL (ref 6.0–8.3)

## 2015-10-01 LAB — POCT URINALYSIS DIPSTICK
BILIRUBIN UA: NEGATIVE
Glucose, UA: NEGATIVE
Ketones, UA: NEGATIVE
NITRITE UA: NEGATIVE
PH UA: 5.5
Protein, UA: NEGATIVE
RBC UA: NEGATIVE
SPEC GRAV UA: 1.01
Urobilinogen, UA: 0.2

## 2015-10-01 LAB — TSH: TSH: 1.52 u[IU]/mL (ref 0.35–4.50)

## 2015-10-01 MED ORDER — FUROSEMIDE 20 MG PO TABS: 40.0000 mg | ORAL_TABLET | Freq: Two times a day (BID) | ORAL | Status: DC

## 2015-10-01 MED ORDER — POTASSIUM CHLORIDE ER 10 MEQ PO TBCR: EXTENDED_RELEASE_TABLET | ORAL | Status: DC

## 2015-10-01 NOTE — Progress Notes (Signed)
Subjective:     Patient ID: Ashley Castaneda, female   DOB: [birth year], 65 y.o.   MRN: [id number]  HPI Follow-up  Patient seen today with a complaint of worsening bilateral leg edema.  Patient has extensive past medical history and is followed by various specialists. Previously seen by Dr. Burnice Logan on 09/23/15 for the same complaint.  See note...  Patient reports taking Lasix 40 mg daily without noticeable decrease in leg edema.  She states physically unable to put on compression stockings, but she frequently elevates legs during the day and night. The patient feels that leg edema is mildly improved in the morning by end of day is worsened. Reports limiting salt and hasn't noticed any weight gain or swelling of other extremities on her body. Patients denies any chest heaviness or tightness, dyspnea, or cough.  No orthopnea.    Past Medical History  Diagnosis Date  . Unspecified vitamin D deficiency 03/31/2010  . ANXIETY 07/01/2008  . DEPRESSION 07/01/2008  . REFLEX SYMPATHETIC DYSTROPHY 07/01/2008  . Trigeminal neuralgia 05/21/2009  . Other specified trigeminal nerve disorders 07/01/2008  . ALLERGIC RHINITIS 07/01/2008  . BRONCHITIS, CHRONIC 07/01/2008  . GERD 07/01/2008  . ECZEMA 05/29/2009  . OSTEOARTHRITIS 07/01/2008  . OCCIPITAL NEURALGIA 07/01/2008  . BACK PAIN, THORACIC REGION 03/04/2010  . FIBROMYALGIA 07/01/2008  . LEG PAIN, BILATERAL 07/01/2008  . FACIAL PAIN 12/25/2009  . CARPAL TUNNEL SYNDROME, HX OF 07/01/2008  . DVT, HX OF 07/01/2008  . MIGRAINES, HX OF 07/01/2008  . CHRONIC OBSTRUCTIVE PULMONARY DISEASE, ACUTE EXACERBATION 08/07/2010  . Hyperlipidemia   . Adenomatous colon polyp 09/2008  . Hiatal hernia    Past Surgical History  Procedure Laterality Date  . [facility]      BSO  . Tonsillectomy  1957  . Right foot bone spur  1987  . Left knee surgery  1989  . Right knee surgery  1990  . Right soulder surgery  [place]  . Head craniectomy  1994    MICROVASULAR  DECOMPRESSION  . Right foot surgery  1998    BAD CUT  . Right jaw surgery  2002  . Right thumb sugery  2004  . Carpal tunnel release  2009    RIGHT  . Occipital nerve stimulator insertion      reports that she quit smoking about 3 years ago. Her smoking use included Cigarettes. She has a 60 pack-year smoking history. She has never used smokeless tobacco. She reports that she does not drink alcohol or use illicit drugs. family history includes Heart attack in her father and maternal grandmother; Hypertension in her father; Ovarian cancer (age of onset: 71) in her mother; Prostate cancer in her father; Stroke in her paternal grandmother. Allergies  Allergen Reactions  . Cefazolin     Ancef - in surgery, swelled up, turned blue, heart problems  . Carbamazepine Other (See Comments)    REACTION: confusion  . Venlafaxine Other (See Comments)  . Baclofen     REACTION: confusion  . Ceclor [Cefaclor]     hives  . Duloxetine Other (See Comments)  . Fentanyl     REACTION: nausea and vomiting  . Gabapentin     REACTION: confusion  . Milnacipran Other (See Comments)  . Oxcarbazepine     REACTION: confusion  . Pregabalin Other (See Comments)  . Topiramate Other (See Comments)  . Zonisamide Other (See Comments)     Review of Systems  Constitutional: Negative.   HENT: Negative.  Eyes: Negative.   Respiratory: Negative.   Cardiovascular: Positive for leg swelling. Negative for chest pain and palpitations.  Gastrointestinal: Negative.   Endocrine: Negative.   Genitourinary: Negative.   Musculoskeletal: Negative.   Skin: Negative.   Allergic/Immunologic: Negative.   Neurological: Negative.   Hematological: Negative.   Psychiatric/Behavioral: Negative.       Objective:   Physical Exam  Constitutional: She is oriented to person, place, and time. She appears well-developed and well-nourished.  Cardiovascular: Normal rate, regular rhythm and normal heart sounds.   Pedal pulse  palpable bilaterally (+1).  Pulmonary/Chest: Effort normal and breath sounds normal.  Musculoskeletal: She exhibits edema.  Pitting edema (+2) bilateral lower extremities.  Neurological: She is alert and oriented to person, place, and time. She has normal reflexes.  Skin: Skin is warm and dry.  Psychiatric: She has a normal mood and affect. Her behavior is normal. Judgment and thought content normal.       Assessment:  Bilateral leg edema- Likely multi-factorial- venous stasis, medication (Mobic),?OSA, obestiy  Plan:   check labs-TSH, CMP, UA .  Increase furosemide to 40 mg po bid.  Frequent leg elevation.  Pt declines compression hose. Follow up in 2 weeks to reassess.  Confirm prior sleep study.

## 2015-10-01 NOTE — Patient Instructions (Addendum)
Take two furosemide in the morning and two at lunch. Take two potassium tablets once daily.  Elevate legs at heart level when sitting and lying. Avoid salt containing food and drinks.  Follow-up in 1 week.  Will call with lab results.  Peripheral Edema You have swelling in your legs (peripheral edema). This swelling is due to excess accumulation of salt and water in your body. Edema may be a sign of heart, kidney or liver disease, or a side effect of a medication. It may also be due to problems in the leg veins. Elevating your legs and using special support stockings may be very helpful, if the cause of the swelling is due to poor venous circulation. Avoid long periods of standing, whatever the cause. Treatment of edema depends on identifying the cause. Chips, pretzels, pickles and other salty foods should be avoided. Restricting salt in your diet is almost always needed. Water pills (diuretics) are often used to remove the excess salt and water from your body via urine. These medicines prevent the kidney from reabsorbing sodium. This increases urine flow. Diuretic treatment may also result in lowering of potassium levels in your body. Potassium supplements may be needed if you have to use diuretics daily. Daily weights can help you keep track of your progress in clearing your edema. You should call your caregiver for follow up care as recommended. SEEK IMMEDIATE MEDICAL CARE IF:   You have increased swelling, pain, redness, or heat in your legs.  You develop shortness of breath, especially when lying down.  You develop chest or abdominal pain, weakness, or fainting.  You have a fever.   This information is not intended to replace advice given to you by your health care provider. Make sure you discuss any questions you have with your health care provider.       Document Released: 08/19/2004 Document Revised: 10/04/2011 Document Reviewed: 01/22/2015 Elsevier Interactive Patient Education  [employer].

## 2015-10-01 NOTE — Progress Notes (Signed)
Pre visit review using our clinic review tool, if applicable. No additional management support is needed unless otherwise documented below in the visit note. 

## 2015-10-07 DIAGNOSIS — G608 Other hereditary and idiopathic neuropathies: Secondary | ICD-10-CM | POA: Diagnosis not present

## 2015-10-07 DIAGNOSIS — G43719 Chronic migraine without aura, intractable, without status migrainosus: Secondary | ICD-10-CM | POA: Diagnosis not present

## 2015-10-07 DIAGNOSIS — G629 Polyneuropathy, unspecified: Secondary | ICD-10-CM | POA: Diagnosis not present

## 2015-10-07 DIAGNOSIS — M792 Neuralgia and neuritis, unspecified: Secondary | ICD-10-CM | POA: Diagnosis not present

## 2015-10-08 ENCOUNTER — Ambulatory Visit (INDEPENDENT_AMBULATORY_CARE_PROVIDER_SITE_OTHER): Payer: Medicare Other | Admitting: Family Medicine

## 2015-10-08 VITALS — BP 110/80 | HR 86 | Temp 98.2°F | Ht 63.0 in | Wt 258.8 lb

## 2015-10-08 DIAGNOSIS — R600 Localized edema: Secondary | ICD-10-CM

## 2015-10-08 DIAGNOSIS — E669 Obesity, unspecified: Secondary | ICD-10-CM

## 2015-10-08 DIAGNOSIS — I878 Other specified disorders of veins: Secondary | ICD-10-CM

## 2015-10-08 MED ORDER — TORSEMIDE 20 MG PO TABS: 20.0000 mg | ORAL_TABLET | Freq: Every day | ORAL | Status: DC

## 2015-10-08 NOTE — Progress Notes (Signed)
Subjective:    Patient ID: Ashley Castaneda, female    DOB: 26-Jun-1951, 65 y.o.   MRN: [id number]  HPI Patient seen for follow-up bilateral leg edema. Refer to prior note. She has obesity and suspected venous stasis. Also takes Mobic regularly. Recent labs-including TSH, urinalysis, comprehensive metabolic panel unremarkable. Normal albumin. Has not tolerated compression garments. Elevates legs infrequently. She's had a couple of prior sleep studies most recently reportedly 2 years ago with no evidence for sleep apnea. Currently on furosemide 40 mg twice daily. After increasing to twice a day she has not seen much improvement. Her weight is down 1 additional pound compared to last week. Denies excessive sodium intake. Nuclear stress test 3/14 EF 73%.  No recent chest pains.   She has multiple chronic problems including chronic pain syndrome, trigeminal and occipital neuralgia, fibromyalgia, and idiopathic small fiber neuropathy diagnosed at [facility] by skin biopsy. Her bilateral leg edema was not felt to be related to her neuropathy. She is followed by neurology.  Past Medical History  Diagnosis Date  . Unspecified vitamin D deficiency 03/31/2010  . ANXIETY 07/01/2008  . DEPRESSION 07/01/2008  . REFLEX SYMPATHETIC DYSTROPHY 07/01/2008  . Trigeminal neuralgia 05/21/2009  . Other specified trigeminal nerve disorders 07/01/2008  . ALLERGIC RHINITIS 07/01/2008  . BRONCHITIS, CHRONIC 07/01/2008  . GERD 07/01/2008  . ECZEMA 05/29/2009  . OSTEOARTHRITIS 07/01/2008  . OCCIPITAL NEURALGIA 07/01/2008  . BACK PAIN, THORACIC REGION 03/04/2010  . FIBROMYALGIA 07/01/2008  . LEG PAIN, BILATERAL 07/01/2008  . FACIAL PAIN 12/25/2009  . CARPAL TUNNEL SYNDROME, HX OF 07/01/2008  . DVT, HX OF 07/01/2008  . MIGRAINES, HX OF 07/01/2008  . CHRONIC OBSTRUCTIVE PULMONARY DISEASE, ACUTE EXACERBATION 08/07/2010  . Hyperlipidemia   . Adenomatous colon polyp 09/2008  . Hiatal hernia    Past Surgical History  Procedure  Laterality Date  . [facility]      BSO  . Tonsillectomy  1957  . Right foot bone spur  1987  . Left knee surgery  1989  . Right knee surgery  1990  . Right soulder surgery  [place]  . Head craniectomy  1994    MICROVASULAR DECOMPRESSION  . Right foot surgery  1998    BAD CUT  . Right jaw surgery  2002  . Right thumb sugery  2004  . Carpal tunnel release  2009    RIGHT  . Occipital nerve stimulator insertion      reports that she quit smoking about 3 years ago. Her smoking use included Cigarettes. She has a 60 pack-year smoking history. She has never used smokeless tobacco. She reports that she does not drink alcohol or use illicit drugs. family history includes Heart attack in her father and maternal grandmother; Hypertension in her father; Ovarian cancer (age of onset: 62) in her mother; Prostate cancer in her father; Stroke in her paternal grandmother. Allergies  Allergen Reactions  . Cefazolin     Ancef - in surgery, swelled up, turned blue, heart problems  . Carbamazepine Other (See Comments)    REACTION: confusion  . Venlafaxine Other (See Comments)  . Baclofen     REACTION: confusion  . Ceclor [Cefaclor]     hives  . Duloxetine Other (See Comments)  . Fentanyl     REACTION: nausea and vomiting  . Gabapentin     REACTION: confusion  . Milnacipran Other (See Comments)  . Oxcarbazepine     REACTION: confusion  . Pregabalin Other (See Comments)  .  Topiramate Other (See Comments)  . Zonisamide Other (See Comments)  ]   Review of Systems  Constitutional: Negative for fatigue and unexpected weight change.  Eyes: Negative for visual disturbance.  Respiratory: Negative for cough, chest tightness, shortness of breath and wheezing.   Cardiovascular: Positive for palpitations and leg swelling. Negative for chest pain.  Gastrointestinal: Negative for abdominal pain.  Genitourinary: Negative for dysuria.  Neurological: Negative for dizziness, seizures, syncope,  weakness, light-headedness and headaches.       Objective:   Physical Exam  Constitutional: She is oriented to person, place, and time. She appears well-developed and well-nourished.  Neck: Neck supple. No JVD present.  Cardiovascular: Normal rate and regular rhythm.   Pulmonary/Chest: Effort normal and breath sounds normal. No respiratory distress. She has no wheezes. She has no rales.  Musculoskeletal: She exhibits edema.  Neurological: She is alert and oriented to person, place, and time.          Assessment & Plan:  Bilateral leg edema. Not much change overall. Suspect largely venous stasis related. Continue frequent elevation. She remains reluctant to consider compression garments. We'll try changing her over from furosemide to Demadex 20 mg once daily. Reassess 3 weeks. She is strongly encouraged to lose some weight   Obesity. We explained this is very likely contributing to her bilateral leg edema. She just recently enrolled in a weight loss program which will be supervised by physician

## 2015-10-08 NOTE — Patient Instructions (Signed)

## 2015-10-16 DIAGNOSIS — M47816 Spondylosis without myelopathy or radiculopathy, lumbar region: Secondary | ICD-10-CM | POA: Diagnosis not present

## 2015-10-24 ENCOUNTER — Telehealth: Payer: Self-pay | Admitting: Family Medicine

## 2015-10-24 MED ORDER — TORSEMIDE 20 MG PO TABS: 20.0000 mg | ORAL_TABLET | Freq: Every day | ORAL | Status: DC

## 2015-10-24 NOTE — Telephone Encounter (Signed)
Medication sent in for patient. 

## 2015-10-24 NOTE — Telephone Encounter (Signed)
Pt needs new rx torsemide 20 mg #90 w/refills  send to meds by mail [facility]. Pt states this med is working

## 2015-10-30 ENCOUNTER — Ambulatory Visit (INDEPENDENT_AMBULATORY_CARE_PROVIDER_SITE_OTHER): Payer: Medicare Other | Admitting: Family Medicine

## 2015-10-30 VITALS — BP 122/80 | HR 90 | Temp 98.4°F | Ht 63.0 in | Wt 247.0 lb

## 2015-10-30 DIAGNOSIS — R600 Localized edema: Secondary | ICD-10-CM

## 2015-10-30 LAB — BASIC METABOLIC PANEL
BUN: 27 mg/dL — AB (ref 6–23)
CALCIUM: 9.5 mg/dL (ref 8.4–10.5)
CHLORIDE: 101 meq/L (ref 96–112)
CO2: 26 meq/L (ref 19–32)
CREATININE: 0.97 mg/dL (ref 0.40–1.20)
GFR: 61.48 mL/min (ref >60.00)
GLUCOSE: 104 mg/dL — AB (ref 70–99)
Potassium: 4 mEq/L (ref 3.5–5.1)
Sodium: 136 mEq/L (ref 135–145)

## 2015-10-30 MED ORDER — LINACLOTIDE 290 MCG PO CAPS: 290.0000 ug | ORAL_CAPSULE | Freq: Every day | ORAL | Status: DC

## 2015-10-30 MED ORDER — RABEPRAZOLE SODIUM 20 MG PO TBEC: 20.0000 mg | DELAYED_RELEASE_TABLET | Freq: Two times a day (BID) | ORAL | Status: DC

## 2015-10-30 MED ORDER — ALPRAZOLAM 0.5 MG PO TABS: ORAL_TABLET | ORAL | Status: DC

## 2015-10-30 MED ORDER — TORSEMIDE 20 MG PO TABS: 20.0000 mg | ORAL_TABLET | Freq: Every day | ORAL | Status: DC

## 2015-10-30 MED ORDER — VITAMIN B-12 1000 MCG PO TABS: 1000.0000 ug | ORAL_TABLET | ORAL | Status: DC | PRN

## 2015-10-30 MED ORDER — POTASSIUM CHLORIDE ER 10 MEQ PO TBCR: EXTENDED_RELEASE_TABLET | ORAL | Status: DC

## 2015-10-30 MED ORDER — MONTELUKAST SODIUM 10 MG PO TABS: 10.0000 mg | ORAL_TABLET | Freq: Every day | ORAL | Status: DC

## 2015-10-30 MED ORDER — FLUTICASONE PROPIONATE 50 MCG/ACT NA SUSP: 2.0000 | Freq: Every day | NASAL | Status: DC | PRN

## 2015-10-30 NOTE — Addendum Note (Signed)
Addended by: Elio Forget on: 10/30/2015 12:18 PM   Modules accepted: Orders

## 2015-10-30 NOTE — Progress Notes (Signed)
Pre visit review using our clinic review tool, if applicable. No additional management support is needed unless otherwise documented below in the visit note. 

## 2015-10-30 NOTE — Progress Notes (Signed)
Subjective:    Patient ID: Ashley Castaneda, female    DOB: [birth year], 65 y.o.   MRN: [id number]  HPI Follow-up bilateral leg edema. Refer to prior note. Patient was already taking fairly high-dose furosemide but having tremendous fluid retention. Normal renal function. We switched her to Demadex 20 mg daily in she's had a great response. She has lost 11 pounds.  She is also participating in a weight loss program and thinks that is helping as well. Feels much better overall No dyspnea. No chest pains. Leg edema tremendously improved  Past Medical History  Diagnosis Date  . Unspecified vitamin D deficiency 03/31/2010  . ANXIETY 07/01/2008  . DEPRESSION 07/01/2008  . REFLEX SYMPATHETIC DYSTROPHY 07/01/2008  . Trigeminal neuralgia 05/21/2009  . Other specified trigeminal nerve disorders 07/01/2008  . ALLERGIC RHINITIS 07/01/2008  . BRONCHITIS, CHRONIC 07/01/2008  . GERD 07/01/2008  . ECZEMA 05/29/2009  . OSTEOARTHRITIS 07/01/2008  . OCCIPITAL NEURALGIA 07/01/2008  . BACK PAIN, THORACIC REGION 03/04/2010  . FIBROMYALGIA 07/01/2008  . LEG PAIN, BILATERAL 07/01/2008  . FACIAL PAIN 12/25/2009  . CARPAL TUNNEL SYNDROME, HX OF 07/01/2008  . DVT, HX OF 07/01/2008  . MIGRAINES, HX OF 07/01/2008  . CHRONIC OBSTRUCTIVE PULMONARY DISEASE, ACUTE EXACERBATION 08/07/2010  . Hyperlipidemia   . Adenomatous colon polyp 09/2008  . Hiatal hernia    Past Surgical History  Procedure Laterality Date  . [facility]      BSO  . Tonsillectomy  1957  . Right foot bone spur  1987  . Left knee surgery  1989  . Right knee surgery  1990  . Right soulder surgery  [place]  . Head craniectomy  1994    MICROVASULAR DECOMPRESSION  . Right foot surgery  1998    BAD CUT  . Right jaw surgery  2002  . Right thumb sugery  2004  . Carpal tunnel release  2009    RIGHT  . Occipital nerve stimulator insertion      reports that she quit smoking about 3 years ago. Her smoking use included Cigarettes. She has a 60  pack-year smoking history. She has never used smokeless tobacco. She reports that she does not drink alcohol or use illicit drugs. family history includes Heart attack in her father and maternal grandmother; Hypertension in her father; Ovarian cancer (age of onset: 3) in her mother; Prostate cancer in her father; Stroke in her paternal grandmother. Allergies  Allergen Reactions  . Cefazolin     Ancef - in surgery, swelled up, turned blue, heart problems  . Carbamazepine Other (See Comments)    REACTION: confusion  . Venlafaxine Other (See Comments)  . Baclofen     REACTION: confusion  . Ceclor [Cefaclor]     hives  . Duloxetine Other (See Comments)  . Fentanyl     REACTION: nausea and vomiting  . Gabapentin     REACTION: confusion  . Milnacipran Other (See Comments)  . Oxcarbazepine     REACTION: confusion  . Pregabalin Other (See Comments)  . Topiramate Other (See Comments)  . Zonisamide Other (See Comments)      Review of Systems  Constitutional: Negative for fatigue.  Eyes: Negative for visual disturbance.  Respiratory: Negative for cough, chest tightness, shortness of breath and wheezing.   Cardiovascular: Negative for chest pain, palpitations and leg swelling.  Neurological: Negative for dizziness, seizures, syncope, weakness, light-headedness and headaches.       Objective:   Physical Exam  Constitutional: She  appears well-developed and well-nourished.  Eyes: Pupils are equal, round, and reactive to light.  Neck: Neck supple. No JVD present. No thyromegaly present.  Cardiovascular: Normal rate and regular rhythm.  Exam reveals no gallop.   Pulmonary/Chest: Effort normal and breath sounds normal. No respiratory distress. She has no wheezes. She has no rales.  Musculoskeletal:  No pitting edema  Neurological: She is alert.          Assessment & Plan:  Bilateral leg edema. Tremendously improved. 11 pound weight loss. Recheck basic metabolic panel. Continue  weight loss efforts. Continue Demadex and K replacement.

## 2015-11-10 DIAGNOSIS — M47816 Spondylosis without myelopathy or radiculopathy, lumbar region: Secondary | ICD-10-CM | POA: Diagnosis not present

## 2015-11-24 DIAGNOSIS — M792 Neuralgia and neuritis, unspecified: Secondary | ICD-10-CM | POA: Diagnosis not present

## 2015-11-24 DIAGNOSIS — M791 Myalgia: Secondary | ICD-10-CM | POA: Diagnosis not present

## 2015-12-11 ENCOUNTER — Other Ambulatory Visit: Payer: Self-pay | Admitting: Family Medicine

## 2015-12-11 MED ORDER — MONTELUKAST SODIUM 10 MG PO TABS: 10.0000 mg | ORAL_TABLET | Freq: Every day | ORAL | Status: DC

## 2015-12-11 NOTE — Telephone Encounter (Signed)
Left message on voicemail Rx sent to pharmacy as requested. 

## 2015-12-11 NOTE — Telephone Encounter (Signed)
Pt forgot to reorder her  montelukast (SINGULAIR) 10 MG tablet  Pt gets from [facility] but is out and needs a 30 day sent to  [facility]/ [address]  Pt states she is very stopped up and has been out a couple of days. Would really appreciate refill today.  Pt aware dr Elease Hashimoto is out, but hopes someone else can refill for her.

## 2015-12-12 DIAGNOSIS — M542 Cervicalgia: Secondary | ICD-10-CM | POA: Diagnosis not present

## 2015-12-15 DIAGNOSIS — M47816 Spondylosis without myelopathy or radiculopathy, lumbar region: Secondary | ICD-10-CM | POA: Diagnosis not present

## 2015-12-19 DIAGNOSIS — M791 Myalgia: Secondary | ICD-10-CM | POA: Diagnosis not present

## 2015-12-19 DIAGNOSIS — G90529 Complex regional pain syndrome I of unspecified lower limb: Secondary | ICD-10-CM | POA: Diagnosis not present

## 2015-12-19 DIAGNOSIS — G43719 Chronic migraine without aura, intractable, without status migrainosus: Secondary | ICD-10-CM | POA: Diagnosis not present

## 2015-12-19 DIAGNOSIS — M792 Neuralgia and neuritis, unspecified: Secondary | ICD-10-CM | POA: Diagnosis not present

## 2016-01-07 ENCOUNTER — Other Ambulatory Visit: Payer: Self-pay | Admitting: Family Medicine

## 2016-01-12 ENCOUNTER — Ambulatory Visit (INDEPENDENT_AMBULATORY_CARE_PROVIDER_SITE_OTHER): Payer: Medicare Other | Admitting: Family Medicine

## 2016-01-12 VITALS — BP 100/80 | HR 94 | Temp 98.2°F | Ht 63.0 in | Wt 234.5 lb

## 2016-01-12 DIAGNOSIS — R600 Localized edema: Secondary | ICD-10-CM

## 2016-01-12 DIAGNOSIS — B029 Zoster without complications: Secondary | ICD-10-CM

## 2016-01-12 MED ORDER — VALACYCLOVIR HCL 1 G PO TABS: 1000.0000 mg | ORAL_TABLET | Freq: Three times a day (TID) | ORAL | Status: DC

## 2016-01-12 NOTE — Progress Notes (Signed)
Subjective:    Patient ID: Ashley Castaneda, female    DOB: 07/04/1951, 65 y.o.   MRN: [id number]  HPI   Patient seen with possible shingles Onset 2 days ago of right occipital rash. She has some itching but mostly burning and painful rash. No left-sided symptoms. She's had shingles once previously and rash feels very similar. She tried applying alcohol topically without much improvement. Denies any fevers or chills. She has chronic neuralgic headaches and this pain is different.  Bilateral leg edema. She is done extremely well with Demadex. She has lost 13 pounds since last visit. She is attending some type of supervised weight loss clinic that manages very closely her intake and she is also doing some nutritional shakes. Feels much better overall. No dyspnea. Leg edema greatly improved  Past Medical History  Diagnosis Date  . Unspecified vitamin D deficiency 03/31/2010  . ANXIETY 07/01/2008  . DEPRESSION 07/01/2008  . REFLEX SYMPATHETIC DYSTROPHY 07/01/2008  . Trigeminal neuralgia 05/21/2009  . Other specified trigeminal nerve disorders 07/01/2008  . ALLERGIC RHINITIS 07/01/2008  . BRONCHITIS, CHRONIC 07/01/2008  . GERD 07/01/2008  . ECZEMA 05/29/2009  . OSTEOARTHRITIS 07/01/2008  . OCCIPITAL NEURALGIA 07/01/2008  . BACK PAIN, THORACIC REGION 03/04/2010  . FIBROMYALGIA 07/01/2008  . LEG PAIN, BILATERAL 07/01/2008  . FACIAL PAIN 12/25/2009  . CARPAL TUNNEL SYNDROME, HX OF 07/01/2008  . DVT, HX OF 07/01/2008  . MIGRAINES, HX OF 07/01/2008  . CHRONIC OBSTRUCTIVE PULMONARY DISEASE, ACUTE EXACERBATION 08/07/2010  . Hyperlipidemia   . Adenomatous colon polyp 09/2008  . Hiatal hernia    Past Surgical History  Procedure Laterality Date  . [facility]      BSO  . Tonsillectomy  1957  . Right foot bone spur  1987  . Left knee surgery  1989  . Right knee surgery  1990  . Right soulder surgery  [place]  . Head craniectomy  1994    MICROVASULAR DECOMPRESSION  . Right foot surgery  1998    BAD CUT  . Right jaw surgery  2002  . Right thumb sugery  2004  . Carpal tunnel release  2009    RIGHT  . Occipital nerve stimulator insertion      reports that she quit smoking about 3 years ago. Her smoking use included Cigarettes. She has a 60 pack-year smoking history. She has never used smokeless tobacco. She reports that she does not drink alcohol or use illicit drugs. family history includes Heart attack in her father and maternal grandmother; Hypertension in her father; Ovarian cancer (age of onset: 25) in her mother; Prostate cancer in her father; Stroke in her paternal grandmother. Allergies  Allergen Reactions  . Cefazolin     Ancef - in surgery, swelled up, turned blue, heart problems  . Carbamazepine Other (See Comments)    REACTION: confusion  . Venlafaxine Other (See Comments)  . Baclofen     REACTION: confusion  . Ceclor [Cefaclor]     hives  . Duloxetine Other (See Comments)  . Fentanyl     REACTION: nausea and vomiting  . Gabapentin     REACTION: confusion  . Milnacipran Other (See Comments)  . Oxcarbazepine     REACTION: confusion  . Pregabalin Other (See Comments)  . Topiramate Other (See Comments)  . Zonisamide Other (See Comments)       Review of Systems  Constitutional: Negative for fatigue.  Eyes: Negative for visual disturbance.  Respiratory: Negative for cough, chest  tightness, shortness of breath and wheezing.   Cardiovascular: Negative for chest pain, palpitations and leg swelling.  Endocrine: Negative for polydipsia and polyuria.  Skin: Positive for rash.  Neurological: Negative for dizziness, seizures, syncope, weakness, light-headedness and headaches.       Objective:   Physical Exam  Constitutional: She appears well-developed and well-nourished. No distress.  Eyes: Pupils are equal, round, and reactive to light.  Neck: Neck supple.  Cardiovascular: Normal rate and regular rhythm.   Pulmonary/Chest: Effort normal and breath sounds  normal. No respiratory distress. She has no wheezes. She has no rales.  Musculoskeletal:  Only trace edema legs bilaterally  Lymphadenopathy:    She has no cervical adenopathy.  Skin: Rash noted.  Patient has slightly raised vesicular rash in a few scattered patches right neck region posteriorly and right occipital region. Minimally tender.          Assessment & Plan:  #1 shingles right occipital region and right neck. Valtrex 1 g 3 times a day for 7 days. She is coping fairly well with pain currently  #2 bilateral leg edema. Much improved with weight loss and torsemide. Recheck basic metabolic panel  Eulas Post MD [facility]

## 2016-01-12 NOTE — Progress Notes (Signed)
Pre visit review using our clinic review tool, if applicable. No additional management support is needed unless otherwise documented below in the visit note. 

## 2016-01-12 NOTE — Patient Instructions (Signed)

## 2016-01-13 ENCOUNTER — Encounter: Payer: Self-pay | Admitting: Family Medicine

## 2016-01-13 DIAGNOSIS — R51 Headache: Secondary | ICD-10-CM | POA: Diagnosis not present

## 2016-01-13 DIAGNOSIS — G894 Chronic pain syndrome: Secondary | ICD-10-CM | POA: Diagnosis not present

## 2016-01-13 LAB — BASIC METABOLIC PANEL
BUN: 29 mg/dL — ABNORMAL HIGH (ref 6–23)
CALCIUM: 9.4 mg/dL (ref 8.4–10.5)
CO2: 30 mEq/L (ref 19–32)
CREATININE: 1.06 mg/dL (ref 0.40–1.20)
Chloride: 100 mEq/L (ref 96–112)
GFR: 55.46 mL/min — AB (ref >60.00)
Glucose, Bld: 97 mg/dL (ref 70–99)
Potassium: 4.4 mEq/L (ref 3.5–5.1)
SODIUM: 139 meq/L (ref 135–145)
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
## 2016-01-29 ENCOUNTER — Ambulatory Visit: Payer: Medicare Other | Admitting: Family Medicine

## 2016-01-30 ENCOUNTER — Ambulatory Visit (INDEPENDENT_AMBULATORY_CARE_PROVIDER_SITE_OTHER): Payer: Medicare Other | Admitting: Family Medicine

## 2016-01-30 VITALS — BP 100/60 | HR 92 | Temp 98.3°F | Ht 63.0 in | Wt 231.0 lb

## 2016-01-30 DIAGNOSIS — R600 Localized edema: Secondary | ICD-10-CM | POA: Diagnosis not present

## 2016-01-30 NOTE — Progress Notes (Signed)
Pre visit review using our clinic review tool, if applicable. No additional management support is needed unless otherwise documented below in the visit note. 

## 2016-01-30 NOTE — Progress Notes (Signed)
Subjective:    Patient ID: Ashley Castaneda, female    DOB: 01-21-1951, 65 y.o.   MRN: [id number]  HPI Follow-up regarding her recent bilateral leg edema and obesity. She's done a tremendous job with weight loss. We switched her from furosemide to torsemide and she immediately saw some improvement in edema. Her weight has come down from 264 pounds in late February to current weight of 231 pounds. She is doing a medically supervised weight loss program. Feels much better overall. Still has some very mild edema occasionally. No dyspnea. Still has frequent headaches has been followed closely by neurology regarding that. She also has multiple orthopedic problems including chronic back pain and has been followed by orthopedics for that  Past Medical History  Diagnosis Date  . Unspecified vitamin D deficiency 03/31/2010  . ANXIETY 07/01/2008  . DEPRESSION 07/01/2008  . REFLEX SYMPATHETIC DYSTROPHY 07/01/2008  . Trigeminal neuralgia 05/21/2009  . Other specified trigeminal nerve disorders 07/01/2008  . ALLERGIC RHINITIS 07/01/2008  . BRONCHITIS, CHRONIC 07/01/2008  . GERD 07/01/2008  . ECZEMA 05/29/2009  . OSTEOARTHRITIS 07/01/2008  . OCCIPITAL NEURALGIA 07/01/2008  . BACK PAIN, THORACIC REGION 03/04/2010  . FIBROMYALGIA 07/01/2008  . LEG PAIN, BILATERAL 07/01/2008  . FACIAL PAIN 12/25/2009  . CARPAL TUNNEL SYNDROME, HX OF 07/01/2008  . DVT, HX OF 07/01/2008  . MIGRAINES, HX OF 07/01/2008  . CHRONIC OBSTRUCTIVE PULMONARY DISEASE, ACUTE EXACERBATION 08/07/2010  . Hyperlipidemia   . Adenomatous colon polyp 09/2008  . Hiatal hernia    Past Surgical History  Procedure Laterality Date  . [facility]      BSO  . Tonsillectomy  1957  . Right foot bone spur  1987  . Left knee surgery  1989  . Right knee surgery  1990  . Right soulder surgery  [place]  . Head craniectomy  1994    MICROVASULAR DECOMPRESSION  . Right foot surgery  1998    BAD CUT  . Right jaw surgery  2002  . Right thumb sugery   2004  . Carpal tunnel release  2009    RIGHT  . Occipital nerve stimulator insertion      reports that she quit smoking about 3 years ago. Her smoking use included Cigarettes. She has a 60 pack-year smoking history. She has never used smokeless tobacco. She reports that she does not drink alcohol or use illicit drugs. family history includes Heart attack in her father and maternal grandmother; Hypertension in her father; Ovarian cancer (age of onset: 66) in her mother; Prostate cancer in her father; Stroke in her paternal grandmother. Allergies  Allergen Reactions  . Cefazolin     Ancef - in surgery, swelled up, turned blue, heart problems  . Carbamazepine Other (See Comments)    REACTION: confusion  . Venlafaxine Other (See Comments)  . Baclofen     REACTION: confusion  . Ceclor [Cefaclor]     hives  . Duloxetine Other (See Comments)  . Fentanyl     REACTION: nausea and vomiting  . Gabapentin     REACTION: confusion  . Milnacipran Other (See Comments)  . Oxcarbazepine     REACTION: confusion  . Pregabalin Other (See Comments)  . Topiramate Other (See Comments)  . Zonisamide Other (See Comments)      Review of Systems  Constitutional: Positive for fatigue.  Eyes: Negative for visual disturbance.  Respiratory: Negative for cough, chest tightness, shortness of breath and wheezing.   Cardiovascular: Negative for chest pain,  palpitations and leg swelling.  Endocrine: Negative for polydipsia and polyuria.  Musculoskeletal: Positive for arthralgias.  Neurological: Negative for dizziness, seizures, syncope, weakness, light-headedness and headaches.       Objective:   Physical Exam  Constitutional: She appears well-developed and well-nourished.  Neck: Neck supple. No JVD present.  Cardiovascular: Normal rate and regular rhythm.   Pulmonary/Chest: Effort normal and breath sounds normal. No respiratory distress. She has no wheezes. She has no rales.  Musculoskeletal: She  exhibits no edema.          Assessment & Plan:  Chronic bilateral leg edema. Suspect largely venous stasis. She's done a tremendous job with weight loss and seems to be doing well currently with torsemide. Recent electrolytes stable. Routine follow-up 6 months.  Eulas Post MD [facility] at [facility]

## 2016-02-02 DIAGNOSIS — M1712 Unilateral primary osteoarthritis, left knee: Secondary | ICD-10-CM | POA: Diagnosis not present

## 2016-02-02 DIAGNOSIS — M1711 Unilateral primary osteoarthritis, right knee: Secondary | ICD-10-CM | POA: Diagnosis not present

## 2016-02-09 DIAGNOSIS — G43119 Migraine with aura, intractable, without status migrainosus: Secondary | ICD-10-CM | POA: Diagnosis not present

## 2016-02-09 DIAGNOSIS — G43719 Chronic migraine without aura, intractable, without status migrainosus: Secondary | ICD-10-CM | POA: Diagnosis not present

## 2016-02-09 DIAGNOSIS — M791 Myalgia: Secondary | ICD-10-CM | POA: Diagnosis not present

## 2016-02-09 DIAGNOSIS — M792 Neuralgia and neuritis, unspecified: Secondary | ICD-10-CM | POA: Diagnosis not present

## 2016-02-10 DIAGNOSIS — M47816 Spondylosis without myelopathy or radiculopathy, lumbar region: Secondary | ICD-10-CM | POA: Diagnosis not present

## 2016-02-13 DIAGNOSIS — H01025 Squamous blepharitis left lower eyelid: Secondary | ICD-10-CM | POA: Diagnosis not present

## 2016-02-13 DIAGNOSIS — H04123 Dry eye syndrome of bilateral lacrimal glands: Secondary | ICD-10-CM | POA: Diagnosis not present

## 2016-02-13 DIAGNOSIS — H01024 Squamous blepharitis left upper eyelid: Secondary | ICD-10-CM | POA: Diagnosis not present

## 2016-02-13 DIAGNOSIS — G500 Trigeminal neuralgia: Secondary | ICD-10-CM | POA: Diagnosis not present

## 2016-02-13 DIAGNOSIS — H01022 Squamous blepharitis right lower eyelid: Secondary | ICD-10-CM | POA: Diagnosis not present

## 2016-02-13 DIAGNOSIS — Z79899 Other long term (current) drug therapy: Secondary | ICD-10-CM | POA: Diagnosis not present

## 2016-02-13 DIAGNOSIS — H5203 Hypermetropia, bilateral: Secondary | ICD-10-CM | POA: Diagnosis not present

## 2016-02-13 DIAGNOSIS — H11002 Unspecified pterygium of left eye: Secondary | ICD-10-CM | POA: Diagnosis not present

## 2016-02-13 DIAGNOSIS — Z87891 Personal history of nicotine dependence: Secondary | ICD-10-CM | POA: Diagnosis not present

## 2016-02-13 DIAGNOSIS — H01021 Squamous blepharitis right upper eyelid: Secondary | ICD-10-CM | POA: Diagnosis not present

## 2016-02-13 DIAGNOSIS — H2513 Age-related nuclear cataract, bilateral: Secondary | ICD-10-CM | POA: Diagnosis not present

## 2016-02-13 DIAGNOSIS — H5712 Ocular pain, left eye: Secondary | ICD-10-CM | POA: Diagnosis not present

## 2016-02-13 DIAGNOSIS — H52203 Unspecified astigmatism, bilateral: Secondary | ICD-10-CM | POA: Diagnosis not present

## 2016-02-13 DIAGNOSIS — Z7982 Long term (current) use of aspirin: Secondary | ICD-10-CM | POA: Diagnosis not present

## 2016-02-13 DIAGNOSIS — H0259 Other disorders affecting eyelid function: Secondary | ICD-10-CM | POA: Diagnosis not present

## 2016-02-16 DIAGNOSIS — M170 Bilateral primary osteoarthritis of knee: Secondary | ICD-10-CM | POA: Diagnosis not present

## 2016-02-23 DIAGNOSIS — M170 Bilateral primary osteoarthritis of knee: Secondary | ICD-10-CM | POA: Diagnosis not present

## 2016-03-05 ENCOUNTER — Ambulatory Visit (INDEPENDENT_AMBULATORY_CARE_PROVIDER_SITE_OTHER): Payer: Medicare Other | Admitting: Family Medicine

## 2016-03-05 VITALS — BP 100/70 | HR 102 | Temp 98.4°F | Ht 63.0 in | Wt 222.9 lb

## 2016-03-05 DIAGNOSIS — R252 Cramp and spasm: Secondary | ICD-10-CM

## 2016-03-05 DIAGNOSIS — R110 Nausea: Secondary | ICD-10-CM | POA: Diagnosis not present

## 2016-03-05 LAB — BASIC METABOLIC PANEL
BUN: 28 mg/dL — ABNORMAL HIGH (ref 6–23)
CALCIUM: 9.4 mg/dL (ref 8.4–10.5)
CO2: 28 mEq/L (ref 19–32)
Chloride: 101 mEq/L (ref 96–112)
Creatinine, Ser: 1.07 mg/dL (ref 0.40–1.20)
GFR: 54.84 mL/min — AB (ref >60.00)
GLUCOSE: 109 mg/dL — AB (ref 70–99)
Potassium: 4.3 mEq/L (ref 3.5–5.1)
SODIUM: 138 meq/L (ref 135–145)

## 2016-03-05 LAB — MAGNESIUM: Magnesium: 2 mg/dL (ref 1.5–2.5)

## 2016-03-05 NOTE — Patient Instructions (Signed)
Try reducing the Demadex to one half tablet (10 mg) once daily Continue with daily weights.

## 2016-03-05 NOTE — Progress Notes (Signed)
Subjective:     Patient ID: Ashley Castaneda, female   DOB: [birth year], 65 y.o.   MRN: [id number]  HPI Patient seen with couple week history of some nausea without vomiting and leg cramps. She has long history of leg edema and takes torsemide 20 mg daily. Her leg edema is much improved. She has done a tremendous job with weight loss through a supervised weight loss program has already lost another 8 pounds from last visit a month ago. She has generally been losing about 2 pounds per week.  No abdominal pain. No fevers or chills. No stool changes. No clear triggering factors for nausea.  Past Medical History:  Diagnosis Date  . Adenomatous colon polyp 09/2008  . ALLERGIC RHINITIS 07/01/2008  . ANXIETY 07/01/2008  . BACK PAIN, THORACIC REGION 03/04/2010  . BRONCHITIS, CHRONIC 07/01/2008  . CARPAL TUNNEL SYNDROME, HX OF 07/01/2008  . CHRONIC OBSTRUCTIVE PULMONARY DISEASE, ACUTE EXACERBATION 08/07/2010  . DEPRESSION 07/01/2008  . DVT, HX OF 07/01/2008  . ECZEMA 05/29/2009  . FACIAL PAIN 12/25/2009  . FIBROMYALGIA 07/01/2008  . GERD 07/01/2008  . Hiatal hernia   . Hyperlipidemia   . LEG PAIN, BILATERAL 07/01/2008  . MIGRAINES, HX OF 07/01/2008  . OCCIPITAL NEURALGIA 07/01/2008  . OSTEOARTHRITIS 07/01/2008  . Other specified trigeminal nerve disorders 07/01/2008  . REFLEX SYMPATHETIC DYSTROPHY 07/01/2008  . Trigeminal neuralgia 05/21/2009  . Unspecified vitamin D deficiency 03/31/2010   Past Surgical History:  Procedure Laterality Date  . CARPAL TUNNEL RELEASE  2009   RIGHT  . HEAD CRANIECTOMY  1994   MICROVASULAR DECOMPRESSION  . LAVH     BSO  . LEFT KNEE SURGERY  1989  . OCCIPITAL NERVE STIMULATOR INSERTION    . RIGHT FOOT BONE SPUR  1987  . RIGHT FOOT SURGERY  1998   BAD CUT  . RIGHT JAW SURGERY  2002  . RIGHT KNEE SURGERY  1990  . [place]  . RIGHT THUMB SUGERY  2004  . TONSILLECTOMY  1957    reports that she quit smoking about 4 years ago. Her smoking  use included Cigarettes. She has a 60.00 pack-year smoking history. She has never used smokeless tobacco. She reports that she does not drink alcohol or use drugs. family history includes Heart attack in her father and maternal grandmother; Hypertension in her father; Ovarian cancer (age of onset: 23) in her mother; Prostate cancer in her father; Stroke in her paternal grandmother. Allergies  Allergen Reactions  . Cefazolin     Ancef - in surgery, swelled up, turned blue, heart problems  . Carbamazepine Other (See Comments)    REACTION: confusion  . Venlafaxine Other (See Comments)  . Baclofen     REACTION: confusion  . Ceclor [Cefaclor]     hives  . Duloxetine Other (See Comments)  . Fentanyl     REACTION: nausea and vomiting  . Gabapentin     REACTION: confusion  . Milnacipran Other (See Comments)  . Oxcarbazepine     REACTION: confusion  . Pregabalin Other (See Comments)  . Topiramate Other (See Comments)  . Zonisamide Other (See Comments)     Review of Systems  Constitutional: Negative for chills and fever.  Respiratory: Negative for cough and shortness of breath.   Cardiovascular: Negative for chest pain.  Gastrointestinal: Positive for nausea. Negative for abdominal pain, constipation, diarrhea and vomiting.  Genitourinary: Negative for dysuria.       Objective:   Physical  Exam  Constitutional: She appears well-developed and well-nourished.  HENT:  Mouth/Throat: Oropharynx is clear and moist.  Cardiovascular: Normal rate and regular rhythm.   Pulmonary/Chest: Effort normal and breath sounds normal. No respiratory distress. She has no wheezes. She has no rales.  Musculoskeletal: She exhibits no edema.       Assessment:     #1 nausea without vomiting. Etiology unclear. She does not appear dehydrated and is keeping down foods  #2 bilateral leg cramps    Plan:     -Check labs with basic metabolic panel and magnesium -Try reducing torsemide to 10 mg  daily -Follow-up for any vomiting, abdominal pain, or new symptoms  Eulas Post MD [facility] at [facility]

## 2016-03-09 NOTE — Progress Notes (Signed)
Informed pt of results.

## 2016-03-22 ENCOUNTER — Other Ambulatory Visit: Payer: Self-pay | Admitting: Family Medicine

## 2016-03-22 NOTE — Telephone Encounter (Signed)
Pt needs rapaflo and xanax #90 w/refills send to [facility] by [facility]

## 2016-03-23 MED ORDER — SILODOSIN 8 MG PO CAPS: 8.0000 mg | ORAL_CAPSULE | Freq: Every day | ORAL | 3 refills | Status: DC

## 2016-03-23 MED ORDER — ALPRAZOLAM 0.5 MG PO TABS: ORAL_TABLET | ORAL | 1 refills | Status: DC

## 2016-03-23 NOTE — Telephone Encounter (Signed)
Refill Rapaflo for one year. Refill Xanax with one refill.

## 2016-03-23 NOTE — Telephone Encounter (Signed)
Last refill on Xanax #90, 1rf 10/30/2015  Last OV 01/30/2016 Please advise

## 2016-03-23 NOTE — Telephone Encounter (Signed)
Pt is not taking rapaflo? Do you remember what other medication besides Xanax.

## 2016-03-23 NOTE — Telephone Encounter (Signed)
Patient has been on Rapaflo in the past and wanted it again. Sent in Rx for a years worth and faxed over the Xanax to [facility] [place].

## 2016-03-24 DIAGNOSIS — M170 Bilateral primary osteoarthritis of knee: Secondary | ICD-10-CM | POA: Diagnosis not present

## 2016-03-24 DIAGNOSIS — M25561 Pain in right knee: Secondary | ICD-10-CM | POA: Diagnosis not present

## 2016-04-05 DIAGNOSIS — M47816 Spondylosis without myelopathy or radiculopathy, lumbar region: Secondary | ICD-10-CM | POA: Diagnosis not present

## 2016-04-06 DIAGNOSIS — M47812 Spondylosis without myelopathy or radiculopathy, cervical region: Secondary | ICD-10-CM | POA: Diagnosis not present

## 2016-04-06 DIAGNOSIS — R51 Headache: Secondary | ICD-10-CM | POA: Diagnosis not present

## 2016-04-19 ENCOUNTER — Encounter: Payer: Self-pay | Admitting: Gynecology

## 2016-04-19 DIAGNOSIS — Z1231 Encounter for screening mammogram for malignant neoplasm of breast: Secondary | ICD-10-CM | POA: Diagnosis not present

## 2016-04-20 DIAGNOSIS — M47812 Spondylosis without myelopathy or radiculopathy, cervical region: Secondary | ICD-10-CM | POA: Diagnosis not present

## 2016-04-30 DIAGNOSIS — M5030 Other cervical disc degeneration, unspecified cervical region: Secondary | ICD-10-CM | POA: Diagnosis not present

## 2016-04-30 DIAGNOSIS — M5136 Other intervertebral disc degeneration, lumbar region: Secondary | ICD-10-CM | POA: Diagnosis not present

## 2016-05-04 DIAGNOSIS — R51 Headache: Secondary | ICD-10-CM | POA: Diagnosis not present

## 2016-05-04 DIAGNOSIS — G894 Chronic pain syndrome: Secondary | ICD-10-CM | POA: Diagnosis not present

## 2016-05-04 DIAGNOSIS — M47812 Spondylosis without myelopathy or radiculopathy, cervical region: Secondary | ICD-10-CM | POA: Diagnosis not present

## 2016-05-18 DIAGNOSIS — M47812 Spondylosis without myelopathy or radiculopathy, cervical region: Secondary | ICD-10-CM | POA: Diagnosis not present

## 2016-05-24 DIAGNOSIS — M791 Myalgia: Secondary | ICD-10-CM | POA: Diagnosis not present

## 2016-05-24 DIAGNOSIS — G43119 Migraine with aura, intractable, without status migrainosus: Secondary | ICD-10-CM | POA: Diagnosis not present

## 2016-05-24 DIAGNOSIS — G43719 Chronic migraine without aura, intractable, without status migrainosus: Secondary | ICD-10-CM | POA: Diagnosis not present

## 2016-05-24 DIAGNOSIS — M792 Neuralgia and neuritis, unspecified: Secondary | ICD-10-CM | POA: Diagnosis not present

## 2016-06-08 DIAGNOSIS — M47812 Spondylosis without myelopathy or radiculopathy, cervical region: Secondary | ICD-10-CM | POA: Diagnosis not present

## 2016-06-08 DIAGNOSIS — M4302 Spondylolysis, cervical region: Secondary | ICD-10-CM | POA: Diagnosis not present

## 2016-06-22 DIAGNOSIS — M4302 Spondylolysis, cervical region: Secondary | ICD-10-CM | POA: Diagnosis not present

## 2016-06-22 DIAGNOSIS — M47812 Spondylosis without myelopathy or radiculopathy, cervical region: Secondary | ICD-10-CM | POA: Diagnosis not present

## 2016-07-01 ENCOUNTER — Ambulatory Visit (HOSPITAL_COMMUNITY)
Admission: EM | Admit: 2016-07-01 | Discharge: 2016-07-01 | Disposition: A | Discharge status: Home / Self Care | Payer: Medicare Other | Attending: Emergency Medicine | Admitting: Emergency Medicine

## 2016-07-01 ENCOUNTER — Encounter (HOSPITAL_COMMUNITY): Payer: Self-pay | Admitting: Emergency Medicine

## 2016-07-01 DIAGNOSIS — B9789 Other viral agents as the cause of diseases classified elsewhere: Secondary | ICD-10-CM

## 2016-07-01 DIAGNOSIS — J069 Acute upper respiratory infection, unspecified: Secondary | ICD-10-CM | POA: Diagnosis not present

## 2016-07-01 MED ORDER — BENZONATATE 100 MG PO CAPS: 100.0000 mg | ORAL_CAPSULE | Freq: Three times a day (TID) | ORAL | 0 refills | Status: DC

## 2016-07-01 MED ORDER — AZITHROMYCIN 250 MG PO TABS: ORAL_TABLET | ORAL | 0 refills | Status: DC

## 2016-07-01 NOTE — ED Provider Notes (Signed)
[place]    CSN: [id number] Arrival date & time: 07/01/16  1433     History   Chief Complaint Chief Complaint  Patient presents with  . URI    HPI Ashley Castaneda is a 65 y.o. female.   HPI  She is a 65 year old woman here for evaluation of cough and congestion. Her symptoms started 2 weeks ago with nasal congestion, sore throat, and cough. Over the last 2 weeks symptoms have progressed. She now reports congestion in her chest as well as burning in the chest with cough. She denies any wheezing or shortness of breath. She does report low-grade fever of 99.8.  She has been treated with a steroid pack and Mucinex without much improvement. She does have a history of chronic bronchitis.  She has been taking her husband to multiple ERs, hospitals, and offices for a COPD exacerbation.  Past Medical History:  Diagnosis Date  . Adenomatous colon polyp 09/2008  . ALLERGIC RHINITIS 07/01/2008  . ANXIETY 07/01/2008  . BACK PAIN, THORACIC REGION 03/04/2010  . BRONCHITIS, CHRONIC 07/01/2008  . CARPAL TUNNEL SYNDROME, HX OF 07/01/2008  . CHRONIC OBSTRUCTIVE PULMONARY DISEASE, ACUTE EXACERBATION 08/07/2010  . DEPRESSION 07/01/2008  . DVT, HX OF 07/01/2008  . ECZEMA 05/29/2009  . FACIAL PAIN 12/25/2009  . FIBROMYALGIA 07/01/2008  . GERD 07/01/2008  . Hiatal hernia   . Hyperlipidemia   . LEG PAIN, BILATERAL 07/01/2008  . MIGRAINES, HX OF 07/01/2008  . OCCIPITAL NEURALGIA 07/01/2008  . OSTEOARTHRITIS 07/01/2008  . Other specified trigeminal nerve disorders 07/01/2008  . REFLEX SYMPATHETIC DYSTROPHY 07/01/2008  . Trigeminal neuralgia 05/21/2009  . Unspecified vitamin D deficiency 03/31/2010    Patient Active Problem List   Diagnosis Date Noted  . Obesity 10/08/2015  . Migraine, unspecified, without mention of intractable migraine without mention of status migrainosus 01/18/2014  . Dizziness and giddiness 01/18/2014  . Numbness and tingling of both legs 01/18/2014  . Severe obesity (BMI >=  40) ([place]) 06/14/2013  . Numbness and tingling in right hand 05/30/2013  . Hyperlipidemia 06/06/2012  . Fatigue 06/06/2012  . Dvt femoral (deep venous thrombosis) ([place]) 10/28/2011  . CHRONIC OBSTRUCTIVE PULMONARY DISEASE, ACUTE EXACERBATION 08/07/2010  . UNSPECIFIED VITAMIN D DEFICIENCY 03/31/2010  . BACK PAIN, THORACIC REGION 03/04/2010  . ECZEMA 05/29/2009  . TRIGEMINAL NEURALGIA 05/21/2009  . Anxiety state 07/01/2008  . DEPRESSION 07/01/2008  . REFLEX SYMPATHETIC DYSTROPHY 07/01/2008  . OTHER SPECIFIED TRIGEMINAL NERVE DISORDERS 07/01/2008  . ALLERGIC RHINITIS 07/01/2008  . BRONCHITIS, CHRONIC 07/01/2008  . GERD 07/01/2008  . OSTEOARTHRITIS 07/01/2008  . OCCIPITAL NEURALGIA 07/01/2008  . FIBROMYALGIA 07/01/2008  . CARPAL TUNNEL SYNDROME, HX OF 07/01/2008  . MIGRAINES, HX OF 07/01/2008    Past Surgical History:  Procedure Laterality Date  . CARPAL TUNNEL RELEASE  2009   RIGHT  . HEAD CRANIECTOMY  1994   MICROVASULAR DECOMPRESSION  . LAVH     BSO  . LEFT KNEE SURGERY  1989  . OCCIPITAL NERVE STIMULATOR INSERTION    . RIGHT FOOT BONE SPUR  1987  . RIGHT FOOT SURGERY  1998   BAD CUT  . RIGHT JAW SURGERY  2002  . RIGHT KNEE SURGERY  1990  . [place]  . RIGHT THUMB SUGERY  2004  . TONSILLECTOMY  1957    OB History    Gravida Para Term Preterm AB Living   0  SAB TAB Ectopic Multiple Live Births                   Home Medications    Prior to Admission medications   Medication Sig Start Date End Date Taking? Authorizing Provider  ALPRAZolam Duanne Moron) 0.5 MG tablet Take one tablet qhs prn. 03/23/16  Yes Eulas Post, MD  calcium carbonate (OS-CAL - DOSED IN MG OF ELEMENTAL CALCIUM) 1250 MG tablet Take 1 tablet by mouth daily.   Yes Historical Provider, MD  carboxymethylcellulose (REFRESH PLUS) 0.5 % SOLN Take 1 drop by mouth daily as needed. For dry eyes   Yes Historical Provider, MD  cetirizine (ZYRTEC) 10 MG  tablet Take 10 mg by mouth daily.     Yes Historical Provider, MD  cycloSPORINE (RESTASIS) 0.05 % ophthalmic emulsion Place 1 drop into both eyes 2 (two) times daily.   Yes Historical Provider, MD  fluticasone (FLONASE) 50 MCG/ACT nasal spray Place 2 sprays into both nostrils daily as needed. For nasal congestion 10/30/15 10/06/17 Yes Eulas Post, MD  frovatriptan (FROVA) 2.5 MG tablet Take 2.5 mg by mouth as needed. If recurs, may repeat after 2 hours. Max of 3 tabs in 24 hours. For migraines.   Yes Historical Provider, MD  hydrOXYzine (ATARAX) 10 MG tablet Take 10 mg by mouth every 8 (eight) hours as needed. For dizziness per Renaldo Reel, MD   Yes Historical Provider, MD  lidocaine (LIDODERM) 5 % Place 1 patch onto the skin as needed. Remove & Discard patch within 12 hours or as directed by Renaldo Reel MD    Yes Historical Provider, MD  Linaclotide Rolan Lipa) 290 MCG CAPS capsule Take 1 capsule (290 mcg total) by mouth daily. Patient taking differently: Take 290 mcg by mouth daily.  10/30/15  Yes Eulas Post, MD  meclizine (ANTIVERT) 25 MG tablet TAKE 1 TABLET BY MOUTH 4 TIMES A DAY AS NEEDED FOR DIZZINESS 04/17/14  Yes Kathrynn Ducking, MD  meloxicam (MOBIC) 15 MG tablet Take 1 tablet (15 mg total) by mouth daily. 09/09/14  Yes Eulas Post, MD  metaxalone (SKELAXIN) 800 MG tablet Take 800 mg by mouth 3 (three) times daily.     Yes Historical Provider, MD  metoclopramide (REGLAN) 10 MG tablet Take 10 mg by mouth 3 (three) times daily as needed. For nausea   Yes Historical Provider, MD  montelukast (SINGULAIR) 10 MG tablet TAKE 1 TABLET (10 MG TOTAL) BY MOUTH DAILY AT 6 PM. 01/07/16  Yes Eulas Post, MD  Multiple Vitamin (MULTIVITAMIN) tablet Take 1 tablet by mouth every morning.    Yes Historical Provider, MD  Omega-3 Fatty Acids (FISH OIL) 1200 MG CAPS Take 1 capsule by mouth every evening.   Yes Historical Provider, MD  potassium chloride (K-DUR) 10 MEQ tablet Take two tablets once  daily 10/30/15  Yes Eulas Post, MD  RABEprazole (ACIPHEX) 20 MG tablet Take 1 tablet (20 mg total) by mouth 2 (two) times daily. 10/30/15  Yes Eulas Post, MD  silodosin (RAPAFLO) 8 MG CAPS capsule Take 1 capsule (8 mg total) by mouth daily with breakfast. 03/23/16  Yes Eulas Post, MD  tiZANidine (ZANAFLEX) 2 MG tablet Take 2 mg by mouth at bedtime.   Yes Historical Provider, MD  torsemide (DEMADEX) 20 MG tablet Take 1 tablet (20 mg total) by mouth daily. 10/30/15  Yes Eulas Post, MD  UNABLE TO FIND Reported on 09/23/2015 02/22/14  Yes Historical Provider, MD  vitamin B-12 (CYANOCOBALAMIN) 1000 MCG tablet Take 1 tablet (1,000 mcg total) by mouth as needed. Reported on 10/01/2015 10/30/15  Yes Eulas Post, MD  vitamin E 400 UNIT capsule Take 400 Units by mouth daily at 6 PM.    Yes Historical Provider, MD  azithromycin (ZITHROMAX Z-PAK) 250 MG tablet Take 2 pills today, then 1 pill daily until gone. 07/01/16   Melony Overly, MD  benzonatate (TESSALON) 100 MG capsule Take 1 capsule (100 mg total) by mouth every 8 (eight) hours. 07/01/16   Melony Overly, MD    Family History Family History  Problem Relation Age of Onset  . Ovarian cancer Mother 42  . Hypertension Father   . Prostate cancer Father   . Heart attack Father   . Heart attack Maternal Grandmother   . Stroke Paternal Grandmother     Social History Social History  Substance Use Topics  . Smoking status: Former Smoker    Packs/day: 2.00    Years: 30.00    Types: Cigarettes    Quit date: 02/04/2012  . Smokeless tobacco: Never Used  . Alcohol use No     Comment: quit 2000     Allergies   Cefazolin; Carbamazepine; Venlafaxine; Baclofen; Ceclor [cefaclor]; Duloxetine; Fentanyl; Gabapentin; Milnacipran; Oxcarbazepine; Pregabalin; Topiramate; and Zonisamide   Review of Systems Review of Systems As in history of present illness  Physical Exam Triage Vital Signs ED Triage Vitals [07/01/16 1449]  Enc Vitals  Group     BP 125/62     Pulse Rate 82     Resp      Temp 97.8 F (36.6 C)     Temp Source Oral     SpO2 100 %     Weight      Height      Head Circumference      Peak Flow      Pain Score 7     Pain Loc      Pain Edu?      Excl. in [place]?    No data found.   Updated Vital Signs BP 125/62 (BP Location: Left Arm)   Pulse 82   Temp 97.8 F (36.6 C) (Oral)   LMP 07/26/1992   SpO2 100%   Visual Acuity Right Eye Distance:   Left Eye Distance:   Bilateral Distance:    Right Eye Near:   Left Eye Near:    Bilateral Near:     Physical Exam  Constitutional: She is oriented to person, place, and time. She appears well-developed and well-nourished. No distress.  HENT:  Mouth/Throat: Oropharynx is clear and moist. No oropharyngeal exudate.  Nasal mucosa normal  Neck: Neck supple.  Cardiovascular: Normal rate, regular rhythm and normal heart sounds.   No murmur heard. Pulmonary/Chest: Effort normal and breath sounds normal. No respiratory distress. She has no wheezes. She has no rales.  Lymphadenopathy:    She has no cervical adenopathy.  Neurological: She is alert and oriented to person, place, and time.     UC Treatments / Results  Labs (all labs ordered are listed, but only abnormal results are displayed) Labs Reviewed - No data to display  EKG  EKG Interpretation None       Radiology No results found.  Procedures Procedures (including critical care time)  Medications Ordered in UC Medications - No data to display   Initial Impression / Assessment and Plan / UC Course  I have reviewed the triage vital signs and the  nursing notes.  Pertinent labs & imaging results that were available during my care of the patient were reviewed by me and considered in my medical decision making (see chart for details).  Clinical Course     Given duration of symptoms, will cover with azithromycin. Tessalon as needed for cough. Follow-up as needed.  Final Clinical  Impressions(s) / UC Diagnoses   Final diagnoses:  Viral URI with cough    New Prescriptions Discharge Medication List as of 07/01/2016  3:07 PM    START taking these medications   Details  azithromycin (ZITHROMAX Z-PAK) 250 MG tablet Take 2 pills today, then 1 pill daily until gone., Normal    benzonatate (TESSALON) 100 MG capsule Take 1 capsule (100 mg total) by mouth every 8 (eight) hours., Starting Thu 07/01/2016, Normal         Melony Overly, MD 07/01/16 [phone]

## 2016-07-01 NOTE — Discharge Instructions (Signed)
You have an upper respiratory infection. There is no sign of bronchitis or pneumonia. Take azithromycin as prescribed. Use Tessalon 3 times a day as needed for cough. Continue the Mucinex for another 3 days. Follow-up as needed. It is okay to get a flu shot once your fever resolves.

## 2016-07-01 NOTE — ED Triage Notes (Signed)
Pt has been suffering from URI symptoms since Thanksgiving.  She reports headache, nasal congestion, chest congestion and a cough.  She has been on steroids and mucinex with very little relief.  She denies any fever.

## 2016-07-05 ENCOUNTER — Telehealth: Payer: Self-pay | Admitting: Family Medicine

## 2016-07-05 NOTE — Telephone Encounter (Signed)
Last OV 03-05-2016 Last refill 03-23-2016 #90, 1rf Please advise

## 2016-07-05 NOTE — Telephone Encounter (Signed)
Pt was taking up to 3 pills a day instead of 1 pill  for alprazolam. Please verify and [facility] . Pt also needs rx sent to [facility]

## 2016-07-05 NOTE — Telephone Encounter (Signed)
We can refill with one additional refill but recommend she try to scale back as much as possible

## 2016-07-06 DIAGNOSIS — Z23 Encounter for immunization: Secondary | ICD-10-CM | POA: Diagnosis not present

## 2016-07-06 MED ORDER — ALPRAZOLAM 0.5 MG PO TABS: ORAL_TABLET | ORAL | 1 refills | Status: DC

## 2016-07-06 MED ORDER — ALPRAZOLAM 0.5 MG PO TABS: ORAL_TABLET | ORAL | 0 refills | Status: DC

## 2016-07-06 NOTE — Telephone Encounter (Signed)
Spoke with pt. Verbally called in one script to her local pharmacy and faxed one refill to mail order. She is aware and agreeable.

## 2016-07-12 ENCOUNTER — Telehealth: Payer: Self-pay | Admitting: Family Medicine

## 2016-07-12 NOTE — Telephone Encounter (Signed)
Patient Name: Ashley Castaneda  DOB: [birth year]    Initial Comment Hessie is having a lot of congestion.   Nurse Assessment  Nurse: Raphael Gibney, RN, Vanita Ingles Date/Time (Eastern Time): 07/12/2016 11:59:12 AM  Confirm and document reason for call. If symptomatic, describe symptoms. ---Caller states she has had nasal congestions since Thanksgiving. Has taken steroids and went to urgent care and was prescribed Z pack about 2 weeks ago. Temp 99 which she says is a fever for her. Has had chills. Has used mucinex and saline nasal washes.  Does the patient have any new or worsening symptoms? ---Yes  Will a triage be completed? ---Yes  Related visit to physician within the last 2 weeks? ---No  Does the PT have any chronic conditions? (i.e. diabetes, asthma, etc.) ---Yes  List chronic conditions. ---low immune system; joint pain  Is this a behavioral health or substance abuse call? ---No     Guidelines    Guideline Title Affirmed Question Affirmed Notes  Sinus Pain or Congestion [1] Sinus congestion (pressure, fullness) AND [2] present > 10 days    Final Disposition User   See PCP When Office is Open (within 3 days) Raphael Gibney, RN, Vanita Ingles    Comments  Appt scheduled for 07/13/2016 at 11:45 am with Dr. Carolann Littler   Disagree/Comply: Comply

## 2016-07-12 NOTE — Telephone Encounter (Signed)
Noted  

## 2016-07-13 ENCOUNTER — Ambulatory Visit (INDEPENDENT_AMBULATORY_CARE_PROVIDER_SITE_OTHER): Payer: Medicare Other | Admitting: Family Medicine

## 2016-07-13 VITALS — BP 130/90 | HR 89 | Temp 97.9°F | Ht 63.0 in | Wt 218.0 lb

## 2016-07-13 DIAGNOSIS — J309 Allergic rhinitis, unspecified: Secondary | ICD-10-CM | POA: Diagnosis not present

## 2016-07-13 MED ORDER — LEVOCETIRIZINE DIHYDROCHLORIDE 5 MG PO TABS: 5.0000 mg | ORAL_TABLET | Freq: Every evening | ORAL | 3 refills | Status: DC

## 2016-07-13 MED ORDER — LEVOCETIRIZINE DIHYDROCHLORIDE 5 MG PO TABS: 5.0000 mg | ORAL_TABLET | Freq: Every evening | ORAL | 1 refills | Status: DC

## 2016-07-13 NOTE — Progress Notes (Signed)
Subjective:     Patient ID: Ashley Castaneda, female   DOB: September 12, 1950, 65 y.o.   MRN: [id number]  HPI   Patient here with one month history of nasal congestive symptoms. This started about a full month ago. She was placed on a steroid pack per pain management (for neck pain) and symptoms did briefly improve. She has seasonal and perennial allergies and already takes Zyrtec, Singulair, and Flonase daily. Also taking Mucinex recently. She went to urgent care 2 weeks ago was placed on Zithromax but did not see any improvement in symptoms. Minimal cough. No facial pain. No fevers or chills.  Past Medical History:  Diagnosis Date  . Adenomatous colon polyp 09/2008  . ALLERGIC RHINITIS 07/01/2008  . ANXIETY 07/01/2008  . BACK PAIN, THORACIC REGION 03/04/2010  . BRONCHITIS, CHRONIC 07/01/2008  . CARPAL TUNNEL SYNDROME, HX OF 07/01/2008  . CHRONIC OBSTRUCTIVE PULMONARY DISEASE, ACUTE EXACERBATION 08/07/2010  . DEPRESSION 07/01/2008  . DVT, HX OF 07/01/2008  . ECZEMA 05/29/2009  . FACIAL PAIN 12/25/2009  . FIBROMYALGIA 07/01/2008  . GERD 07/01/2008  . Hiatal hernia   . Hyperlipidemia   . LEG PAIN, BILATERAL 07/01/2008  . MIGRAINES, HX OF 07/01/2008  . OCCIPITAL NEURALGIA 07/01/2008  . OSTEOARTHRITIS 07/01/2008  . Other specified trigeminal nerve disorders 07/01/2008  . REFLEX SYMPATHETIC DYSTROPHY 07/01/2008  . Trigeminal neuralgia 05/21/2009  . Unspecified vitamin D deficiency 03/31/2010   Past Surgical History:  Procedure Laterality Date  . CARPAL TUNNEL RELEASE  2009   RIGHT  . HEAD CRANIECTOMY  1994   MICROVASULAR DECOMPRESSION  . LAVH     BSO  . LEFT KNEE SURGERY  1989  . OCCIPITAL NERVE STIMULATOR INSERTION    . RIGHT FOOT BONE SPUR  1987  . RIGHT FOOT SURGERY  1998   BAD CUT  . RIGHT JAW SURGERY  2002  . RIGHT KNEE SURGERY  1990  . [place]  . RIGHT THUMB SUGERY  2004  . TONSILLECTOMY  1957    reports that she quit smoking about 4 years ago. Her smoking  use included Cigarettes. She has a 60.00 pack-year smoking history. She has never used smokeless tobacco. She reports that she does not drink alcohol or use drugs. family history includes Heart attack in her father and maternal grandmother; Hypertension in her father; Ovarian cancer (age of onset: 66) in her mother; Prostate cancer in her father; Stroke in her paternal grandmother. Allergies  Allergen Reactions  . Cefazolin     Ancef - in surgery, swelled up, turned blue, heart problems  . Carbamazepine Other (See Comments)    REACTION: confusion  . Venlafaxine Other (See Comments)  . Baclofen     REACTION: confusion  . Ceclor [Cefaclor]     hives  . Duloxetine Other (See Comments)  . Fentanyl     REACTION: nausea and vomiting  . Gabapentin     REACTION: confusion  . Milnacipran Other (See Comments)  . Oxcarbazepine     REACTION: confusion  . Pregabalin Other (See Comments)  . Topiramate Other (See Comments)  . Zonisamide Other (See Comments)     Review of Systems  Constitutional: Positive for fatigue. Negative for chills and fever.  HENT: Positive for congestion. Negative for sinus pressure.   Respiratory: Negative for shortness of breath and wheezing.   Neurological: Negative for headaches.       Objective:   Physical Exam  Constitutional: She appears well-developed and well-nourished.  HENT:  Right Ear: External ear normal.  Left Ear: External ear normal.  Nose: Nose normal.  Mouth/Throat: Oropharynx is clear and moist.  Neck: Neck supple.  Cardiovascular: Normal rate and regular rhythm.   Pulmonary/Chest: Effort normal and breath sounds normal. No respiratory distress. She has no wheezes. She has no rales.  Lymphadenopathy:    She has no cervical adenopathy.       Assessment:     Persistent rhinitis symptoms. Suspect allergic. Doubt bacterial sinusitis    Plan:     -Patient will try switching to Xyzal 5 mg once daily -Continue with Flonase,  Singulair -Consider addition of Astelin if not improving in a couple weeks  Eulas Post MD [facility] at [facility]

## 2016-07-13 NOTE — Patient Instructions (Signed)
Try the XYZal in place of the Zyrtec.   Continue with the Flonase and Singulair. We might consider Astelin if no relief with the above.

## 2016-07-13 NOTE — Progress Notes (Signed)
Pre visit review using our clinic review tool, if applicable. No additional management support is needed unless otherwise documented below in the visit note. 

## 2016-07-29 ENCOUNTER — Telehealth: Payer: Self-pay | Admitting: Family Medicine

## 2016-07-29 NOTE — Telephone Encounter (Signed)
Please advise on medications or if pt needs to be re seen.

## 2016-07-29 NOTE — Telephone Encounter (Signed)
Pt was seen on 07-13-16 and still has head congestion. Please advise [facility]. Pt is aware md out of office today

## 2016-07-30 MED ORDER — DOXYCYCLINE HYCLATE 100 MG PO TABS: 100.0000 mg | ORAL_TABLET | Freq: Two times a day (BID) | ORAL | 0 refills | Status: DC

## 2016-07-30 NOTE — Telephone Encounter (Signed)
Pt is aware that medication is at pharmacy for pick up.

## 2016-07-30 NOTE — Telephone Encounter (Signed)
Does she have any colored nasal mucus, fever, or facial pain.  If so, would go ahead and start Doxycycline 100 mg po bid for 10 days- confirm no allergy to Doxy.

## 2016-08-25 DIAGNOSIS — G609 Hereditary and idiopathic neuropathy, unspecified: Secondary | ICD-10-CM | POA: Diagnosis not present

## 2016-08-25 DIAGNOSIS — G608 Other hereditary and idiopathic neuropathies: Secondary | ICD-10-CM | POA: Diagnosis not present

## 2016-08-25 DIAGNOSIS — G894 Chronic pain syndrome: Secondary | ICD-10-CM | POA: Diagnosis not present

## 2016-08-25 DIAGNOSIS — G4451 Hemicrania continua: Secondary | ICD-10-CM | POA: Diagnosis not present

## 2016-08-25 DIAGNOSIS — G500 Trigeminal neuralgia: Secondary | ICD-10-CM | POA: Diagnosis not present

## 2016-08-25 DIAGNOSIS — Z87891 Personal history of nicotine dependence: Secondary | ICD-10-CM | POA: Diagnosis not present

## 2016-08-25 DIAGNOSIS — M549 Dorsalgia, unspecified: Secondary | ICD-10-CM | POA: Diagnosis not present

## 2016-08-25 DIAGNOSIS — M542 Cervicalgia: Secondary | ICD-10-CM | POA: Diagnosis not present

## 2016-08-25 DIAGNOSIS — G9050 Complex regional pain syndrome I, unspecified: Secondary | ICD-10-CM | POA: Diagnosis not present

## 2016-08-25 DIAGNOSIS — M5481 Occipital neuralgia: Secondary | ICD-10-CM | POA: Diagnosis not present

## 2016-09-01 ENCOUNTER — Ambulatory Visit (INDEPENDENT_AMBULATORY_CARE_PROVIDER_SITE_OTHER): Payer: Medicare Other | Admitting: Family Medicine

## 2016-09-01 VITALS — BP 100/80 | HR 82 | Ht 63.0 in | Wt 224.0 lb

## 2016-09-01 DIAGNOSIS — G4459 Other complicated headache syndrome: Secondary | ICD-10-CM | POA: Diagnosis not present

## 2016-09-01 MED ORDER — PREDNISONE 10 MG PO TABS: ORAL_TABLET | ORAL | 0 refills | Status: DC

## 2016-09-01 NOTE — Progress Notes (Signed)
Subjective:     Patient ID: Ashley Castaneda, female   DOB: 05/01/1951, 66 y.o.   MRN: [id number]  HPI Patient seen with diffuse headache relatively constant for the past 2 weeks. She has an extremely long and complex history of chronic headaches and is followed by neurologist in [place], [place] and also by pain management. She states that she had some shoulder surgery back in the 90s and following that developed trigeminal neuralgia. She has seen multiple neurologists over the years. She has history of hemicrania continua. Current headaches are bilateral and diffuse but relatively constant. No recent injury. She describes this as a throbbing and sometimes burning or stinging quality. She has long history of neuralgic type headaches. No vomiting or nausea. No focal weakness.  She was recently placed by her neurologist on regimen of cyproheptadine, Zofran, and Imitrex. This has not helped. They were unable to work her in today. She denies any recent fevers or chills. No visual changes. No focal weakness. Last MRI scan 2015. This showed evidence for previous right occipital craniotomy with some encephalomalacia right lateral cerebellum but no mass lesions.  Current headache is continuous and not positional.  Only thing that provides relief is some heat on the posterior occipital area and directly applied to the head.  Past Medical History:  Diagnosis Date  . Adenomatous colon polyp 09/2008  . ALLERGIC RHINITIS 07/01/2008  . ANXIETY 07/01/2008  . BACK PAIN, THORACIC REGION 03/04/2010  . BRONCHITIS, CHRONIC 07/01/2008  . CARPAL TUNNEL SYNDROME, HX OF 07/01/2008  . CHRONIC OBSTRUCTIVE PULMONARY DISEASE, ACUTE EXACERBATION 08/07/2010  . DEPRESSION 07/01/2008  . DVT, HX OF 07/01/2008  . ECZEMA 05/29/2009  . FACIAL PAIN 12/25/2009  . FIBROMYALGIA 07/01/2008  . GERD 07/01/2008  . Hiatal hernia   . Hyperlipidemia   . LEG PAIN, BILATERAL 07/01/2008  . MIGRAINES, HX OF 07/01/2008  . OCCIPITAL NEURALGIA 07/01/2008   . OSTEOARTHRITIS 07/01/2008  . Other specified trigeminal nerve disorders 07/01/2008  . REFLEX SYMPATHETIC DYSTROPHY 07/01/2008  . Trigeminal neuralgia 05/21/2009  . Unspecified vitamin D deficiency 03/31/2010   Past Surgical History:  Procedure Laterality Date  . CARPAL TUNNEL RELEASE  2009   RIGHT  . HEAD CRANIECTOMY  1994   MICROVASULAR DECOMPRESSION  . LAVH     BSO  . LEFT KNEE SURGERY  1989  . OCCIPITAL NERVE STIMULATOR INSERTION    . RIGHT FOOT BONE SPUR  1987  . RIGHT FOOT SURGERY  1998   BAD CUT  . RIGHT JAW SURGERY  2002  . RIGHT KNEE SURGERY  1990  . [place]  . RIGHT THUMB SUGERY  2004  . TONSILLECTOMY  1957    reports that she quit smoking about 4 years ago. Her smoking use included Cigarettes. She has a 60.00 pack-year smoking history. She has never used smokeless tobacco. She reports that she does not drink alcohol or use drugs. family history includes Heart attack in her father and maternal grandmother; Hypertension in her father; Ovarian cancer (age of onset: 3) in her mother; Prostate cancer in her father; Stroke in her paternal grandmother. Allergies  Allergen Reactions  . Cefazolin     Ancef - in surgery, swelled up, turned blue, heart problems  . Carbamazepine Other (See Comments)    REACTION: confusion  . Venlafaxine Other (See Comments)  . Baclofen     REACTION: confusion  . Ceclor [Cefaclor]     hives  . Duloxetine Other (See Comments)  .  Fentanyl     REACTION: nausea and vomiting  . Gabapentin     REACTION: confusion  . Milnacipran Other (See Comments)  . Oxcarbazepine     REACTION: confusion  . Pregabalin Other (See Comments)  . Topiramate Other (See Comments)  . Zonisamide Other (See Comments)     Review of Systems  Constitutional: Negative for chills and fever.  Respiratory: Negative for shortness of breath.   Cardiovascular: Negative for chest pain.  Gastrointestinal: Negative for vomiting.   Neurological: Positive for headaches. Negative for dizziness, seizures, syncope and speech difficulty.  Psychiatric/Behavioral: Negative for confusion.       Objective:   Physical Exam  Constitutional: She is oriented to person, place, and time. She appears well-developed and well-nourished.  HENT:  Right Ear: External ear normal.  Left Ear: External ear normal.  Eyes: Pupils are equal, round, and reactive to light.  Neck: Neck supple.  Cardiovascular: Normal rate and regular rhythm.   Pulmonary/Chest: Effort normal and breath sounds normal. No respiratory distress. She has no wheezes. She has no rales.  Musculoskeletal:  Neck is supple but she has significant muscular tension and tenderness paracervical muscles bilaterally and trapezius muscles  Neurological: She is alert and oriented to person, place, and time. No cranial nerve deficit. Coordination normal.  Full strength throughout. Gait normal. Cerebellar function normal. Cranial nerves II through XII intact  Psychiatric: She has a normal mood and affect. Her behavior is normal.       Assessment:     Intractable diffuse headache in a patient with complex history of headaches followed by neurology. Nonfocal exam neurologically    Plan:     -Trial of prednisone taper which has helped occasionally in the past. -We have encouraged her to follow-up with her neurologist within 2 days if above is not helping and sooner for any new symptoms or worsening headache.  Eulas Post MD [facility]

## 2016-09-01 NOTE — Patient Instructions (Signed)
General Headache Without Cause A headache is pain or discomfort felt around the head or neck area. The specific cause of a headache may not be found. There are many causes and types of headaches. A few common ones are:  Tension headaches.  Migraine headaches.  Cluster headaches.  Chronic daily headaches.  Follow these instructions at home: Watch your condition for any changes. Take these steps to help with your condition: Managing pain  Take over-the-counter and prescription medicines only as told by your health care provider.  Lie down in a dark, quiet room when you have a headache.  If directed, apply ice to the head and neck area: ? Put ice in a plastic bag. ? Place a towel between your skin and the bag. ? Leave the ice on for 20 minutes, 2-3 times per day.  Use a heating pad or hot shower to apply heat to the head and neck area as told by your health care provider.  Keep lights dim if bright lights bother you or make your headaches worse. Eating and drinking  Eat meals on a regular schedule.  Limit alcohol use.  Decrease the amount of caffeine you drink, or stop drinking caffeine. General instructions  Keep all follow-up visits as told by your health care provider. This is important.  Keep a headache journal to help find out what may trigger your headaches. For example, write down: ? What you eat and drink. ? How much sleep you get. ? Any change to your diet or medicines.  Try massage or other relaxation techniques.  Limit stress.  Sit up straight, and do not tense your muscles.  Do not use tobacco products, including cigarettes, chewing tobacco, or e-cigarettes. If you need help quitting, ask your health care provider.  Exercise regularly as told by your health care provider.  Sleep on a regular schedule. Get 7-9 hours of sleep, or the amount recommended by your health care provider. Contact a health care provider if:  Your symptoms are not helped by  medicine.  You have a headache that is different from the usual headache.  You have nausea or you vomit.  You have a fever. Get help right away if:  Your headache becomes severe.  You have repeated vomiting.  You have a stiff neck.  You have a loss of vision.  You have problems with speech.  You have pain in the eye or ear.  You have muscular weakness or loss of muscle control.  You lose your balance or have trouble walking.  You feel faint or pass out.  You have confusion. This information is not intended to replace advice given to you by your health care provider. Make sure you discuss any questions you have with your health care provider. Document Released: 07/12/2005 Document Revised: 12/18/2015 Document Reviewed: 11/04/2014 Elsevier Interactive Patient Education  2017 [employer]  

## 2016-09-01 NOTE — Progress Notes (Signed)
Pre visit review using our clinic review tool, if applicable. No additional management support is needed unless otherwise documented below in the visit note. 

## 2016-09-08 DIAGNOSIS — K5900 Constipation, unspecified: Secondary | ICD-10-CM | POA: Diagnosis not present

## 2016-09-08 DIAGNOSIS — M5184 Other intervertebral disc disorders, thoracic region: Secondary | ICD-10-CM | POA: Diagnosis not present

## 2016-09-08 DIAGNOSIS — M47816 Spondylosis without myelopathy or radiculopathy, lumbar region: Secondary | ICD-10-CM | POA: Diagnosis not present

## 2016-09-08 DIAGNOSIS — M50922 Unspecified cervical disc disorder at C5-C6 level: Secondary | ICD-10-CM | POA: Diagnosis not present

## 2016-09-08 DIAGNOSIS — M5387 Other specified dorsopathies, lumbosacral region: Secondary | ICD-10-CM | POA: Diagnosis not present

## 2016-09-08 DIAGNOSIS — M5416 Radiculopathy, lumbar region: Secondary | ICD-10-CM | POA: Diagnosis not present

## 2016-09-08 DIAGNOSIS — M5135 Other intervertebral disc degeneration, thoracolumbar region: Secondary | ICD-10-CM | POA: Diagnosis not present

## 2016-09-08 DIAGNOSIS — M50923 Unspecified cervical disc disorder at C6-C7 level: Secondary | ICD-10-CM | POA: Diagnosis not present

## 2016-09-08 DIAGNOSIS — M47817 Spondylosis without myelopathy or radiculopathy, lumbosacral region: Secondary | ICD-10-CM | POA: Diagnosis not present

## 2016-09-08 DIAGNOSIS — M5412 Radiculopathy, cervical region: Secondary | ICD-10-CM | POA: Diagnosis not present

## 2016-09-08 DIAGNOSIS — M5186 Other intervertebral disc disorders, lumbar region: Secondary | ICD-10-CM | POA: Diagnosis not present

## 2016-09-08 DIAGNOSIS — M50822 Other cervical disc disorders at C5-C6 level: Secondary | ICD-10-CM | POA: Diagnosis not present

## 2016-09-08 DIAGNOSIS — M50823 Other cervical disc disorders at C6-C7 level: Secondary | ICD-10-CM | POA: Diagnosis not present

## 2016-09-08 DIAGNOSIS — M4186 Other forms of scoliosis, lumbar region: Secondary | ICD-10-CM | POA: Diagnosis not present

## 2016-09-08 DIAGNOSIS — I739 Peripheral vascular disease, unspecified: Secondary | ICD-10-CM | POA: Diagnosis not present

## 2016-09-08 DIAGNOSIS — M48062 Spinal stenosis, lumbar region with neurogenic claudication: Secondary | ICD-10-CM | POA: Diagnosis not present

## 2016-09-10 DIAGNOSIS — G609 Hereditary and idiopathic neuropathy, unspecified: Secondary | ICD-10-CM | POA: Diagnosis not present

## 2016-09-10 DIAGNOSIS — G894 Chronic pain syndrome: Secondary | ICD-10-CM | POA: Diagnosis not present

## 2016-09-10 DIAGNOSIS — R51 Headache: Secondary | ICD-10-CM | POA: Diagnosis not present

## 2016-09-10 DIAGNOSIS — M4302 Spondylolysis, cervical region: Secondary | ICD-10-CM | POA: Diagnosis not present

## 2016-09-16 DIAGNOSIS — G9389 Other specified disorders of brain: Secondary | ICD-10-CM | POA: Diagnosis not present

## 2016-09-16 DIAGNOSIS — M5412 Radiculopathy, cervical region: Secondary | ICD-10-CM | POA: Diagnosis not present

## 2016-09-16 DIAGNOSIS — M48061 Spinal stenosis, lumbar region without neurogenic claudication: Secondary | ICD-10-CM | POA: Diagnosis not present

## 2016-09-16 DIAGNOSIS — M546 Pain in thoracic spine: Secondary | ICD-10-CM | POA: Diagnosis not present

## 2016-09-16 DIAGNOSIS — M5136 Other intervertebral disc degeneration, lumbar region: Secondary | ICD-10-CM | POA: Diagnosis not present

## 2016-09-16 DIAGNOSIS — M5416 Radiculopathy, lumbar region: Secondary | ICD-10-CM | POA: Diagnosis not present

## 2016-09-16 DIAGNOSIS — M438X4 Other specified deforming dorsopathies, thoracic region: Secondary | ICD-10-CM | POA: Diagnosis not present

## 2016-09-16 DIAGNOSIS — M9971 Connective tissue and disc stenosis of intervertebral foramina of cervical region: Secondary | ICD-10-CM | POA: Diagnosis not present

## 2016-09-16 DIAGNOSIS — M5124 Other intervertebral disc displacement, thoracic region: Secondary | ICD-10-CM | POA: Diagnosis not present

## 2016-09-16 DIAGNOSIS — M5134 Other intervertebral disc degeneration, thoracic region: Secondary | ICD-10-CM | POA: Diagnosis not present

## 2016-09-16 DIAGNOSIS — M50323 Other cervical disc degeneration at C6-C7 level: Secondary | ICD-10-CM | POA: Diagnosis not present

## 2016-09-16 DIAGNOSIS — M47816 Spondylosis without myelopathy or radiculopathy, lumbar region: Secondary | ICD-10-CM | POA: Diagnosis not present

## 2016-09-16 DIAGNOSIS — M2578 Osteophyte, vertebrae: Secondary | ICD-10-CM | POA: Diagnosis not present

## 2016-09-16 DIAGNOSIS — M438X2 Other specified deforming dorsopathies, cervical region: Secondary | ICD-10-CM | POA: Diagnosis not present

## 2016-09-16 DIAGNOSIS — M438X6 Other specified deforming dorsopathies, lumbar region: Secondary | ICD-10-CM | POA: Diagnosis not present

## 2016-09-16 DIAGNOSIS — M4802 Spinal stenosis, cervical region: Secondary | ICD-10-CM | POA: Diagnosis not present

## 2016-09-17 ENCOUNTER — Telehealth: Payer: Self-pay | Admitting: Family Medicine

## 2016-09-17 MED ORDER — TORSEMIDE 10 MG PO TABS: 10.0000 mg | ORAL_TABLET | Freq: Every day | ORAL | 2 refills | Status: DC

## 2016-09-17 NOTE — Telephone Encounter (Signed)
Medication sent in for patient. 

## 2016-09-17 NOTE — Telephone Encounter (Signed)
° ° °  Pt said he has been taking 10mg     Pt request refill of the following:  torsemide (DEMADEX) 20 MG tablet     Phamacy: Meds by mail [facility]

## 2016-09-21 DIAGNOSIS — M542 Cervicalgia: Secondary | ICD-10-CM | POA: Diagnosis not present

## 2016-09-21 DIAGNOSIS — M4302 Spondylolysis, cervical region: Secondary | ICD-10-CM | POA: Diagnosis not present

## 2016-09-24 ENCOUNTER — Ambulatory Visit: Payer: Medicare Other | Admitting: Neurology

## 2016-10-11 DIAGNOSIS — G4451 Hemicrania continua: Secondary | ICD-10-CM | POA: Diagnosis not present

## 2016-10-11 DIAGNOSIS — H01021 Squamous blepharitis right upper eyelid: Secondary | ICD-10-CM | POA: Diagnosis not present

## 2016-10-11 DIAGNOSIS — H01025 Squamous blepharitis left lower eyelid: Secondary | ICD-10-CM | POA: Diagnosis not present

## 2016-10-11 DIAGNOSIS — G500 Trigeminal neuralgia: Secondary | ICD-10-CM | POA: Diagnosis not present

## 2016-10-11 DIAGNOSIS — H04123 Dry eye syndrome of bilateral lacrimal glands: Secondary | ICD-10-CM | POA: Diagnosis not present

## 2016-10-11 DIAGNOSIS — H52203 Unspecified astigmatism, bilateral: Secondary | ICD-10-CM | POA: Diagnosis not present

## 2016-10-11 DIAGNOSIS — H01024 Squamous blepharitis left upper eyelid: Secondary | ICD-10-CM | POA: Diagnosis not present

## 2016-10-11 DIAGNOSIS — H0289 Other specified disorders of eyelid: Secondary | ICD-10-CM | POA: Diagnosis not present

## 2016-10-11 DIAGNOSIS — H5203 Hypermetropia, bilateral: Secondary | ICD-10-CM | POA: Diagnosis not present

## 2016-10-11 DIAGNOSIS — H01022 Squamous blepharitis right lower eyelid: Secondary | ICD-10-CM | POA: Diagnosis not present

## 2016-10-15 ENCOUNTER — Ambulatory Visit (INDEPENDENT_AMBULATORY_CARE_PROVIDER_SITE_OTHER): Payer: Medicare Other

## 2016-10-15 VITALS — BP 136/70 | HR 98 | Ht 63.0 in | Wt 229.4 lb

## 2016-10-15 DIAGNOSIS — Z0000 Encounter for general adult medical examination without abnormal findings: Secondary | ICD-10-CM

## 2016-10-15 NOTE — Patient Instructions (Addendum)
Ashley Castaneda , Thank you for taking time to come for your Medicare Wellness Visit. I appreciate your ongoing commitment to your health goals. Please review the following plan we discussed and let me know if I can assist you in the future.   Shingrix is a vaccine for the prevention of herpes Zoster or Shingles in Adults 50 and older.  If you are on Medicare, you can request a prescription from your doctor to be filled at a pharmacy.  Please check with your benefits regarding applicable copays or out of pocket expenses.  The Shingrix is given in 2 vaccines approx 8 weeks apart. You must receive the 2nd ose prior to 6 months from receipt of the first.   will get a RX from Dr. Elease Hashimoto during your fup    Will check to see if you have had a Hep c drawn?   HIV screening  To discuss with Dr. Elease Hashimoto Had neg test already as she was checked due to small fiber neuropathy   Pap smear / followed with Dr. Phineas Real / will go back this year  He also bone density   Mammogram 03/2017 due   The Centers for Disease Control are now recommending 2 pneumonia vaccinations after 8. The first is the Prevnar 13. This helps to boost your immunity to community acquired pneumonia as well as some protection from bacterial pneumonia  The 2nd is the pneumovax 23, which offers more broad protection!  Please consider taking these as this is your best protection against pneumonia. Will take the prenvar 13 in June     These are the goals we discussed: Goals    . Weight (lb) < 200 lb (90.7 kg)          Will continue your weight loss program  Good luck!       This is a list of the screening recommended for you and due dates:  Health Maintenance  Topic Date Due  .  Hepatitis C: One time screening is recommended by Center for Disease Control  (CDC) for  adults born from [birth year] through 1964.   Nov 07, 1950  . HIV Screening  12/28/1966  . Pap Smear  06/10/2011  . Flu Shot  02/24/2016  . Mammogram  04/19/2018  .  Tetanus Vaccine  03/31/2020  . Colon Cancer Screening  02/05/2025        Fall Prevention in the Home Falls can cause injuries. They can happen to people of all ages. There are many things you can do to make your home safe and to help prevent falls. What can I do on the outside of my home?  Regularly fix the edges of walkways and driveways and fix any cracks.  Remove anything that might make you trip as you walk through a door, such as a raised step or threshold.  Trim any bushes or trees on the path to your home.  Use bright outdoor lighting.  Clear any walking paths of anything that might make someone trip, such as rocks or tools.  Regularly check to see if handrails are loose or broken. Make sure that both sides of any steps have handrails.  Any raised decks and porches should have guardrails on the edges.  Have any leaves, snow, or ice cleared regularly.  Use sand or salt on walking paths during winter.  Clean up any spills in your garage right away. This includes oil or grease spills. What can I do in the bathroom?  Use night lights.  Install  grab bars by the toilet and in the tub and shower. Do not use towel bars as grab bars.  Use non-skid mats or decals in the tub or shower.  If you need to sit down in the shower, use a plastic, non-slip stool.  Keep the floor dry. Clean up any water that spills on the floor as soon as it happens.  Remove soap buildup in the tub or shower regularly.  Attach bath mats securely with double-sided non-slip rug tape.  Do not have throw rugs and other things on the floor that can make you trip. What can I do in the bedroom?  Use night lights.  Make sure that you have a light by your bed that is easy to reach.  Do not use any sheets or blankets that are too big for your bed. They should not hang down onto the floor.  Have a firm chair that has side arms. You can use this for support while you get dressed.  Do not have throw  rugs and other things on the floor that can make you trip. What can I do in the kitchen?  Clean up any spills right away.  Avoid walking on wet floors.  Keep items that you use a lot in easy-to-reach places.  If you need to reach something above you, use a strong step stool that has a grab bar.  Keep electrical cords out of the way.  Do not use floor polish or wax that makes floors slippery. If you must use wax, use non-skid floor wax.  Do not have throw rugs and other things on the floor that can make you trip. What can I do with my stairs?  Do not leave any items on the stairs.  Make sure that there are handrails on both sides of the stairs and use them. Fix handrails that are broken or loose. Make sure that handrails are as long as the stairways.  Check any carpeting to make sure that it is firmly attached to the stairs. Fix any carpet that is loose or worn.  Avoid having throw rugs at the top or bottom of the stairs. If you do have throw rugs, attach them to the floor with carpet tape.  Make sure that you have a light switch at the top of the stairs and the bottom of the stairs. If you do not have them, ask someone to add them for you. What else can I do to help prevent falls?  Wear shoes that:  Do not have high heels.  Have rubber bottoms.  Are comfortable and fit you well.  Are closed at the toe. Do not wear sandals.  If you use a stepladder:  Make sure that it is fully opened. Do not climb a closed stepladder.  Make sure that both sides of the stepladder are locked into place.  Ask someone to hold it for you, if possible.  Clearly mark and make sure that you can see:  Any grab bars or handrails.  First and last steps.  Where the edge of each step is.  Use tools that help you move around (mobility aids) if they are needed. These include:  Canes.  Walkers.  Scooters.  Crutches.  Turn on the lights when you go into a dark area. Replace any light  bulbs as soon as they burn out.  Set up your furniture so you have a clear path. Avoid moving your furniture around.  If any of your floors are uneven,  fix them.  If there are any pets around you, be aware of where they are.  Review your medicines with your doctor. Some medicines can make you feel dizzy. This can increase your chance of falling. Ask your doctor what other things that you can do to help prevent falls. This information is not intended to replace advice given to you by your health care provider. Make sure you discuss any questions you have with your health care provider. Document Released: 05/08/2009 Document Revised: 12/18/2015 Document Reviewed: 08/16/2014 Elsevier Interactive Patient Education  2017 [place] Maintenance, Female Adopting a healthy lifestyle and getting preventive care can go a long way to promote health and wellness. Talk with your health care provider about what schedule of regular examinations is right for you. This is a good chance for you to check in with your provider about disease prevention and staying healthy. In between checkups, there are plenty of things you can do on your own. Experts have done a lot of research about which lifestyle changes and preventive measures are most likely to keep you healthy. Ask your health care provider for more information. Weight and diet Eat a healthy diet  Be sure to include plenty of vegetables, fruits, low-fat dairy products, and lean protein.  Do not eat a lot of foods high in solid fats, added sugars, or salt.  Get regular exercise. This is one of the most important things you can do for your health.  Most adults should exercise for at least 150 minutes each week. The exercise should increase your heart rate and make you sweat (moderate-intensity exercise).  Most adults should also do strengthening exercises at least twice a week. This is in addition to the moderate-intensity exercise. Maintain  a healthy weight  Body mass index (BMI) is a measurement that can be used to identify possible weight problems. It estimates body fat based on height and weight. Your health care provider can help determine your BMI and help you achieve or maintain a healthy weight.  For females 76 years of age and older:  A BMI below 18.5 is considered underweight.  A BMI of 18.5 to 24.9 is normal.  A BMI of 25 to 29.9 is considered overweight.  A BMI of 30 and above is considered obese. Watch levels of cholesterol and blood lipids  You should start having your blood tested for lipids and cholesterol at 65 years of age, then have this test every 5 years.  You may need to have your cholesterol levels checked more often if:  Your lipid or cholesterol levels are high.  You are older than 65 years of age.  You are at high risk for heart disease. Cancer screening Lung Cancer  Lung cancer screening is recommended for adults 65-52 years old who are at high risk for lung cancer because of a history of smoking.  A yearly low-dose CT scan of the lungs is recommended for people who:  Currently smoke.  Have quit within the past 15 years.  Have at least a 30-pack-year history of smoking. A pack year is smoking an average of one pack of cigarettes a day for 1 year.  Yearly screening should continue until it has been 15 years since you quit.  Yearly screening should stop if you develop a health problem that would prevent you from having lung cancer treatment. Breast Cancer  Practice breast self-awareness. This means understanding how your breasts normally appear and feel.  It also means doing regular  breast self-exams. Let your health care provider know about any changes, no matter how small.  If you are in your 20s or 30s, you should have a clinical breast exam (CBE) by a health care provider every 1-3 years as part of a regular health exam.  If you are 73 or older, have a CBE every year. Also  consider having a breast X-ray (mammogram) every year.  If you have a family history of breast cancer, talk to your health care provider about genetic screening.  If you are at high risk for breast cancer, talk to your health care provider about having an MRI and a mammogram every year.  Breast cancer gene (BRCA) assessment is recommended for women who have family members with BRCA-related cancers. BRCA-related cancers include:  Breast.  Ovarian.  Tubal.  Peritoneal cancers.  Results of the assessment will determine the need for genetic counseling and BRCA1 and BRCA2 testing. Cervical Cancer  Your health care provider may recommend that you be screened regularly for cancer of the pelvic organs (ovaries, uterus, and vagina). This screening involves a pelvic examination, including checking for microscopic changes to the surface of your cervix (Pap test). You may be encouraged to have this screening done every 3 years, beginning at age 40.  For women ages 87-65, health care providers may recommend pelvic exams and Pap testing every 3 years, or they may recommend the Pap and pelvic exam, combined with testing for human papilloma virus (HPV), every 5 years. Some types of HPV increase your risk of cervical cancer. Testing for HPV may also be done on women of any age with unclear Pap test results.  Other health care providers may not recommend any screening for nonpregnant women who are considered low risk for pelvic cancer and who do not have symptoms. Ask your health care provider if a screening pelvic exam is right for you.  If you have had past treatment for cervical cancer or a condition that could lead to cancer, you need Pap tests and screening for cancer for at least 20 years after your treatment. If Pap tests have been discontinued, your risk factors (such as having a new sexual partner) need to be reassessed to determine if screening should resume. Some women have medical problems that  increase the chance of getting cervical cancer. In these cases, your health care provider may recommend more frequent screening and Pap tests. Colorectal Cancer  This type of cancer can be detected and often prevented.  Routine colorectal cancer screening usually begins at 65 years of age and continues through 65 years of age.  Your health care provider may recommend screening at an earlier age if you have risk factors for colon cancer.  Your health care provider may also recommend using home test kits to check for hidden blood in the stool.  A small camera at the end of a tube can be used to examine your colon directly (sigmoidoscopy or colonoscopy). This is done to check for the earliest forms of colorectal cancer.  Routine screening usually begins at age 4.  Direct examination of the colon should be repeated every 5-10 years through 65 years of age. However, you may need to be screened more often if early forms of precancerous polyps or small growths are found. Skin Cancer  Check your skin from head to toe regularly.  Tell your health care provider about any new moles or changes in moles, especially if there is a change in a mole's shape or color.  Also tell your health care provider if you have a mole that is larger than the size of a pencil eraser.  Always use sunscreen. Apply sunscreen liberally and repeatedly throughout the day.  Protect yourself by wearing long sleeves, pants, a wide-brimmed hat, and sunglasses whenever you are outside. Heart disease, diabetes, and high blood pressure  High blood pressure causes heart disease and increases the risk of stroke. High blood pressure is more likely to develop in:  People who have blood pressure in the high end of the normal range (130-139/85-89 mm Hg).  People who are overweight or obese.  People who are African American.  If you are 26-39 years of age, have your blood pressure checked every 3-5 years. If you are 57 years of  age or older, have your blood pressure checked every year. You should have your blood pressure measured twice-once when you are at a hospital or clinic, and once when you are not at a hospital or clinic. Record the average of the two measurements. To check your blood pressure when you are not at a hospital or clinic, you can use:  An automated blood pressure machine at a pharmacy.  A home blood pressure monitor.  If you are between 71 years and 63 years old, ask your health care provider if you should take aspirin to prevent strokes.  Have regular diabetes screenings. This involves taking a blood sample to check your fasting blood sugar level.  If you are at a normal weight and have a low risk for diabetes, have this test once every three years after 65 years of age.  If you are overweight and have a high risk for diabetes, consider being tested at a younger age or more often. Preventing infection Hepatitis B  If you have a higher risk for hepatitis B, you should be screened for this virus. You are considered at high risk for hepatitis B if:  You were born in a country where hepatitis B is common. Ask your health care provider which countries are considered high risk.  Your parents were born in a high-risk country, and you have not been immunized against hepatitis B (hepatitis B vaccine).  You have HIV or AIDS.  You use needles to inject street drugs.  You live with someone who has hepatitis B.  You have had sex with someone who has hepatitis B.  You get hemodialysis treatment.  You take certain medicines for conditions, including cancer, organ transplantation, and autoimmune conditions. Hepatitis C  Blood testing is recommended for:  Everyone born from [birth year] through 1964.  Anyone with known risk factors for hepatitis C. Sexually transmitted infections (STIs)  You should be screened for sexually transmitted infections (STIs) including gonorrhea and chlamydia if:  You are  sexually active and are younger than 65 years of age.  You are older than 65 years of age and your health care provider tells you that you are at risk for this type of infection.  Your sexual activity has changed since you were last screened and you are at an increased risk for chlamydia or gonorrhea. Ask your health care provider if you are at risk.  If you do not have HIV, but are at risk, it may be recommended that you take a prescription medicine daily to prevent HIV infection. This is called pre-exposure prophylaxis (PrEP). You are considered at risk if:  You are sexually active and do not regularly use condoms or know the HIV status of your partner(s).  You take drugs by injection.  You are sexually active with a partner who has HIV. Talk with your health care provider about whether you are at high risk of being infected with HIV. If you choose to begin PrEP, you should first be tested for HIV. You should then be tested every 3 months for as long as you are taking PrEP. Pregnancy  If you are premenopausal and you may become pregnant, ask your health care provider about preconception counseling.  If you may become pregnant, take 400 to 800 micrograms (mcg) of folic acid every day.  If you want to prevent pregnancy, talk to your health care provider about birth control (contraception). Osteoporosis and menopause  Osteoporosis is a disease in which the bones lose minerals and strength with aging. This can result in serious bone fractures. Your risk for osteoporosis can be identified using a bone density scan.  If you are 52 years of age or older, or if you are at risk for osteoporosis and fractures, ask your health care provider if you should be screened.  Ask your health care provider whether you should take a calcium or vitamin D supplement to lower your risk for osteoporosis.  Menopause may have certain physical symptoms and risks.  Hormone replacement therapy may reduce some of  these symptoms and risks. Talk to your health care provider about whether hormone replacement therapy is right for you. Follow these instructions at home:  Schedule regular health, dental, and eye exams.  Stay current with your immunizations.  Do not use any tobacco products including cigarettes, chewing tobacco, or electronic cigarettes.  If you are pregnant, do not drink alcohol.  If you are breastfeeding, limit how much and how often you drink alcohol.  Limit alcohol intake to no more than 1 drink per day for nonpregnant women. One drink equals 12 ounces of beer, 5 ounces of wine, or 1 ounces of hard liquor.  Do not use street drugs.  Do not share needles.  Ask your health care provider for help if you need support or information about quitting drugs.  Tell your health care provider if you often feel depressed.  Tell your health care provider if you have ever been abused or do not feel safe at home. This information is not intended to replace advice given to you by your health care provider. Make sure you discuss any questions you have with your health care provider. Document Released: 01/25/2011 Document Revised: 12/18/2015 Document Reviewed: 04/15/2015 Elsevier Interactive Patient Education  2017 [employer].

## 2016-10-15 NOTE — Progress Notes (Addendum)
Subjective:   Ashley Castaneda is a 66 y.o. female who presents for Medicare Annual (Subsequent) preventive examination.  The Patient was informed that the wellness visit is to identify future health risk and educate and initiate measures that can reduce risk for increased disease through the lifespan.    NO ROS; Medicare Wellness Visit Small fiber neuropathy; burns mainly in legs  As of today, she rec'd a call from [facility] stating she has  autonomic neuropathy; reports she has been evaluated for episodes x 3 weeks of increased HR; temp changes; bloated abd;  Has been referred back to Primary care for fup  Denies chest pain; no sob; no syncope Encouraged to go to the ER or urgent care if she has these episodes but states they only last 10 minutes. Kidney specialist state her bladder spasms Will make apt with Dr. Elease Hashimoto to [place] is faxing records   Lives with spouse  2 step children ( step dtr)  D/a since 73' due to arthralgia  Pain in all the time; Has plan from h/a specialist at [facility]  Has pain specialist at [facility]   meds;  Adherence good  Good understanding of her med plan Just started on Naltrexone 1mg  po at hs to see if this may interrupt the pain pathway.   Describes health as good, fair or great? Lives with chronic issues   Preventive Screening -Counseling & Management  Colonoscopy 01/2015 Mammogram 04/19/2016 followed by Dr. Phineas Real Dexa 04/2010 reported as normal also followed by Dr. Phineas Real Pap 05/2008 DUE but states she has had once since 2009 and Dr. Phineas Real monitors    Smoking history Quit 01/2012; 60 pack years  To discuss  LDCT but declines for now Smokeless tobacco yes;  Does use vapor cigarettes with the lowest amount of nicotine  1.6 mg   Second Hand Smoke status; Spouse smokes in the home but tries to stay in the bathroom or other when smoking  ETOH - Quit ETOH in [place] to [facility]; (weight loss  center in town)  Healthy eating program They have a dietician;  Breakfast  protein shake with a fat burner Lunch 4 oz meat; chicken [place] and fish 2 veg and 2 starches; slice of diet bread Slice of diet toast; limited starch Supper - one meat and 2 vegetables No starch at supper; no starch or fruit after 2pm States pills to detox - does not  Know what they are Has spinach; broccoli and squash,  Fish, [place] chicken  Regular exercise  States she uses a vibrating machine that is suppose to help her muscles relax. Uses 3 times a day. No hx of clots per her report  "Axis dual motor whole body vibrator"    Cardiac Risk Factors:  Advanced aged > 35 in men; >65 in women Hyperlipidemia - chol 221; hdl 55; LDL 149; trig 156  Diabetes BS 109  Family History - mother had ovarian caner;  Father had htn, Prostate cancer and MI  Obesity BMI 83   Fall risk no falls  Given education on "Fall Prevention in the Home" for more safety tips the patient can apply as appropriate.  Long term goal is to "age in place" or undecided   Mobility of Functional changes this year? no Does have electric chairs at home as needed  3 levels split level  Bathrooms handicapped accessible   Safety;  community safe  wears sunscreen not out  in the sun safe place for firearms; keep in safe place if they exist  Motor vehicle accidents; no   Mental Health:  Any emotional problems? Anxious, depressed, irritable, sad or blue? no Denies feeling depressed or hopeless; voices pleasure in daily life How many social activities have you been engaged in within the last 2 weeks? no   Hearing Screening Comments: Hearing loss on the left Had a hearing aid and did not work for her Vision Screening Comments: Vision checks Goes to [employer] a bleb now No vision issues at present Eye pain from trigeminal but not any eye disease   Activities of Daily Living - See functional screen   Cognitive testing; Ad8  score; Score 0  MMSE deferred or completed if AD8 + 2 issues  Advanced Directives  Reviewed advanced directive and agreed to receipt of information and discussion.  Focused face to face x  20 minutes discussing HCPOA and Living will and reviewed all the questions in the [place] forms. The patient voices understanding of HCPOA; LW reviewed and information provided on each question. Educated on how to revoke this HCPOA or LW at any time.   Also  discussed life prolonging measures (given a few examples) and where she could choose to initiate or not;  the ability to given the HCPOA power to change her living will or not if she cannot speak for herself; as well as finalizing the will by 2 unrelated witnesses and notary.  Will call for questions and given information on [facility] pastoral department for further assistance.      Immunization History  Administered Date(s) Administered  . Influenza Split 04/30/2011, 05/04/2012  . Influenza Whole 06/10/2008, 03/31/2010  . Influenza,inj,Quad PF,36+ Mos 06/14/2013, 03/28/2014  . Influenza-Unspecified 07/26/2016  . Pneumococcal Polysaccharide-23 06/10/2008  . Td 03/31/2010   Required Immunizations needed today  Screening test up to date or reviewed for plan of completion Health Maintenance Due  Topic Date Due  . Hepatitis C Screening  March 01, 1951  . HIV Screening  12/28/1966  . PAP SMEAR  06/10/2011  . INFLUENZA VACCINE  02/24/2016   Thinks she was checked for hep c Will check her records; will postpone for now Education provided  Medicare now request all "baby boomers" test for possible exposure to Hepatitis C. Many may have been exposed due to dental work, tatoo's, vaccinations when young. The Hepatitis C virus is dormant for many years and then sometimes will cause liver cancer. If you gave blood in the past 15 years, you were most likely checked for Hep C. If you rec'd blood; you may want to consider testing or if you are high risk for  any other reason.   Educated regarding HIV screening; thinks this has been done and will check her records   Pap smear deferred to Dr. Phineas Real; to schedule an apt  Flu vaccine; taken and recorded   (has had one PSV 23; 2009) will be 66 this year in June)  Stated she will be back in here to take after her Rudene Anda   Shingrix is a vaccine for the prevention of herpes Zoster or Shingles in Adults 69 and older.  If you are on Medicare, you can request a prescription from your doctor to be filled at a pharmacy.  Please check with your benefits regarding applicable copays or out of pocket expenses.  The Shingrix is given in 2 vaccines approx 8 weeks apart. You must receive the 2nd ose prior to 6 months from  receipt of the first.  Will check with pharmacy   Patient Care Team: Eulas Post, MD as PCP - General Renaldo Reel, MD as Consulting Physician (Neurology) Dr. Rob Hickman; richard Boortz - [place]; pain mgmt Dr. Lurene Shadow,       Objective:     Vitals: BP 136/70   Pulse 98   Ht 5\' 3"  (1.6 m)   Wt 229 lb 6 oz (104 kg)   LMP 07/26/1992   SpO2 90%   BMI 40.63 kg/m   Body mass index is 40.63 kg/m.   Tobacco History  Smoking Status  . Former Smoker  . Packs/day: 2.00  . Years: 30.00  . Types: Cigarettes  . Quit date: 02/04/2012  Smokeless Tobacco  . Current User    Comment: nicotine in vapor and is very low nicotine      Ready to quit: No Counseling given: Yes   Past Medical History:  Diagnosis Date  . Adenomatous colon polyp 09/2008  . ALLERGIC RHINITIS 07/01/2008  . ANXIETY 07/01/2008  . BACK PAIN, THORACIC REGION 03/04/2010  . BRONCHITIS, CHRONIC 07/01/2008  . CARPAL TUNNEL SYNDROME, HX OF 07/01/2008  . CHRONIC OBSTRUCTIVE PULMONARY DISEASE, ACUTE EXACERBATION 08/07/2010  . DEPRESSION 07/01/2008  . DVT, HX OF 07/01/2008  . ECZEMA 05/29/2009  . FACIAL PAIN 12/25/2009  . FIBROMYALGIA 07/01/2008  . GERD 07/01/2008  . Hiatal hernia   . Hyperlipidemia   . LEG PAIN,  BILATERAL 07/01/2008  . MIGRAINES, HX OF 07/01/2008  . OCCIPITAL NEURALGIA 07/01/2008  . OSTEOARTHRITIS 07/01/2008  . Other specified trigeminal nerve disorders 07/01/2008  . REFLEX SYMPATHETIC DYSTROPHY 07/01/2008  . Trigeminal neuralgia 05/21/2009  . Unspecified vitamin D deficiency 03/31/2010   Past Surgical History:  Procedure Laterality Date  . CARPAL TUNNEL RELEASE  2009   RIGHT  . HEAD CRANIECTOMY  1994   MICROVASULAR DECOMPRESSION  . LAVH     BSO  . LEFT KNEE SURGERY  1989  . OCCIPITAL NERVE STIMULATOR INSERTION    . RIGHT FOOT BONE SPUR  1987  . RIGHT FOOT SURGERY  1998   BAD CUT  . RIGHT JAW SURGERY  2002  . RIGHT KNEE SURGERY  1990  . [place]  . RIGHT THUMB SUGERY  2004  . TONSILLECTOMY  1957   Family History  Problem Relation Age of Onset  . Ovarian cancer Mother 41  . Hypertension Father   . Prostate cancer Father   . Heart attack Father   . Heart attack Maternal Grandmother   . Stroke Paternal Grandmother    History  Sexual Activity  . Sexual activity: No    Outpatient Encounter Prescriptions as of 10/15/2016  Medication Sig  . ALPRAZolam (XANAX) 0.5 MG tablet Take one tablet qhs prn.  . calcium carbonate (OS-CAL - DOSED IN MG OF ELEMENTAL CALCIUM) 1250 MG tablet Take 1 tablet by mouth daily.  . carboxymethylcellulose (REFRESH PLUS) 0.5 % SOLN Take 1 drop by mouth daily as needed. For dry eyes  . cetirizine (ZYRTEC) 10 MG tablet Take 10 mg by mouth daily.    . cycloSPORINE (RESTASIS) 0.05 % ophthalmic emulsion Place 1 drop into both eyes 2 (two) times daily.  . fluticasone (FLONASE) 50 MCG/ACT nasal spray Place 2 sprays into both nostrils daily as needed. For nasal congestion  . frovatriptan (FROVA) 2.5 MG tablet Take 2.5 mg by mouth as needed. If recurs, may repeat after 2 hours. Max of 3 tabs in 24 hours.  For migraines.  . hydrOXYzine (ATARAX) 10 MG tablet Take 10 mg by mouth every 8 (eight) hours as needed. For dizziness  per Renaldo Reel, MD  . lidocaine (LIDODERM) 5 % Place 1 patch onto the skin as needed. Remove & Discard patch within 12 hours or as directed by Renaldo Reel MD   . Linaclotide (LINZESS) 290 MCG CAPS capsule Take 1 capsule (290 mcg total) by mouth daily. (Patient taking differently: Take 290 mcg by mouth daily. )  . meclizine (ANTIVERT) 25 MG tablet TAKE 1 TABLET BY MOUTH 4 TIMES A DAY AS NEEDED FOR DIZZINESS  . meloxicam (MOBIC) 15 MG tablet Take 1 tablet (15 mg total) by mouth daily.  . metaxalone (SKELAXIN) 800 MG tablet Take 800 mg by mouth 3 (three) times daily.    . metoclopramide (REGLAN) 10 MG tablet Take 10 mg by mouth 3 (three) times daily as needed. For nausea  . montelukast (SINGULAIR) 10 MG tablet TAKE 1 TABLET (10 MG TOTAL) BY MOUTH DAILY AT 6 PM.  . Multiple Vitamin (MULTIVITAMIN) tablet Take 1 tablet by mouth every morning.   . Omega-3 Fatty Acids (FISH OIL) 1200 MG CAPS Take 1 capsule by mouth every evening.  . potassium chloride (K-DUR) 10 MEQ tablet Take two tablets once daily  . RABEprazole (ACIPHEX) 20 MG tablet Take 1 tablet (20 mg total) by mouth 2 (two) times daily.  . silodosin (RAPAFLO) 8 MG CAPS capsule Take 1 capsule (8 mg total) by mouth daily with breakfast.  . tiZANidine (ZANAFLEX) 2 MG tablet Take 2 mg by mouth at bedtime.  . torsemide (DEMADEX) 10 MG tablet Take 1 tablet (10 mg total) by mouth daily.  . vitamin B-12 (CYANOCOBALAMIN) 1000 MCG tablet Take 1 tablet (1,000 mcg total) by mouth as needed. Reported on 10/01/2015  . vitamin E 400 UNIT capsule Take 400 Units by mouth daily at 6 PM.   . UNABLE TO FIND Reported on 09/23/2015  . [DISCONTINUED] predniSONE (DELTASONE) 10 MG tablet Taper as follows: 4-4-4-3-3-2-2-1-1 (Patient not taking: Reported on 10/15/2016)   Facility-Administered Encounter Medications as of 10/15/2016  Medication  . valproate (DEPACON) 1,500 mg in sodium chloride 0.9 % 100 mL IVPB    Activities of Daily Living In your present state of health,  do you have any difficulty performing the following activities: 10/15/2016  Hearing? Y  Vision? Y  Difficulty concentrating or making decisions? N  Walking or climbing stairs? Y  Dressing or bathing? N  Doing errands, shopping? N  Preparing Food and eating ? N  Using the Toilet? N  In the past six months, have you accidently leaked urine? Y  Do you have problems with loss of bowel control? N  Managing your Medications? N  Managing your Finances? N  Some recent data might be hidden    Patient Care Team: Eulas Post, MD as PCP - General Renaldo Reel, MD as Consulting Physician (Neurology)    Assessment:     Exercise Activities and Dietary recommendations Current Exercise Habits: Home exercise routine  Goals    . Weight (lb) < 200 lb (90.7 kg)          Will continue your weight loss program  Good luck!      Fall Risk Fall Risk  10/15/2016 09/25/2015 06/14/2013  Falls in the past year? No No No   Depression Screen PHQ 2/9 Scores 10/15/2016 06/14/2013  PHQ - 2 Score 0 0     Cognitive Function: Ad8 score is 0  MMSE - Mini Mental State Exam 10/15/2016  Not completed: (No Data)        Immunization History  Administered Date(s) Administered  . Influenza Split 04/30/2011, 05/04/2012  . Influenza Whole 06/10/2008, 03/31/2010  . Influenza,inj,Quad PF,36+ Mos 06/14/2013, 03/28/2014  . Influenza-Unspecified 07/26/2016  . Pneumococcal Polysaccharide-23 06/10/2008  . Td 03/31/2010   Screening Tests Health Maintenance  Topic Date Due  . Hepatitis C Screening  1951/02/22  . HIV Screening  12/28/1966  . PAP SMEAR  06/10/2011  . INFLUENZA VACCINE  02/24/2016  . MAMMOGRAM  04/19/2018  . TETANUS/TDAP  03/31/2020  . COLONOSCOPY  02/05/2025      Plan:    PCP Notes  Health Maintenance Educated on Shingrix and will check with the pharmacy  Will check numerous labs completed for HIV and Hep c  Will schedule GYN with Dr. Phineas Real and will defer HM for now  Will  take Prevnar 13 after her BD in June  Recorded the flu vaccine which she has had  Abnormal Screens  States hearing issues on the left; has hearing aid but doesn't wear it   Referrals numerous specialist involved in care  Educated regarding shingrix   Patient concerns; Initial c/o was for new dx Autonomic Neuropathy affecting her organs and referred back to primary care for evaluation and cardiac fup; C/o of fast HR x 10 minutes; instructed to go to the ER or minutes clinic if this happens., otherwise to diary when this occurs and what she is doing  Nurse Concerns; Still using vapor cigarettes Taking "pills" from [facility]" for detox but is not sure what they are.   Next PCP apt scheduled for Monday    During the course of the visit the patient was educated and counseled about the following appropriate screening and preventive services:   Vaccines to include Pneumoccal, Influenza, Hepatitis B, Td, Zostavax, HCV  Electrocardiogram  Cardiovascular Disease  Colorectal cancer screening  Bone density screening  Diabetes screening  Glaucoma screening  Mammography/PAP  Nutrition counseling   Patient Instructions (the written plan) was given to the patient.   ATFTD,DUKGU, RN  10/15/2016  Agree with assessment as above per Wynetta Fines.  Patient has very complex PMH and is followed by multiple specialists.  Eulas Post MD [facility] at [facility]

## 2016-10-18 ENCOUNTER — Encounter: Payer: Self-pay | Admitting: Family Medicine

## 2016-10-18 ENCOUNTER — Ambulatory Visit (INDEPENDENT_AMBULATORY_CARE_PROVIDER_SITE_OTHER): Payer: Medicare Other | Admitting: Family Medicine

## 2016-10-18 VITALS — BP 110/70 | HR 83 | Temp 98.3°F | Wt 227.8 lb

## 2016-10-18 DIAGNOSIS — R002 Palpitations: Secondary | ICD-10-CM | POA: Diagnosis not present

## 2016-10-18 LAB — BASIC METABOLIC PANEL
BUN: 23 mg/dL (ref 6–23)
CALCIUM: 9.3 mg/dL (ref 8.4–10.5)
CHLORIDE: 101 meq/L (ref 96–112)
CO2: 28 mEq/L (ref 19–32)
CREATININE: 0.95 mg/dL (ref 0.40–1.20)
GFR: 62.79 mL/min (ref >60.00)
Glucose, Bld: 88 mg/dL (ref 70–99)
Potassium: 4.4 mEq/L (ref 3.5–5.1)
SODIUM: 139 meq/L (ref 135–145)

## 2016-10-18 LAB — TSH: TSH: 2.33 u[IU]/mL (ref 0.35–4.50)

## 2016-10-18 NOTE — Progress Notes (Signed)
Pre visit review using our clinic review tool, if applicable. No additional management support is needed unless otherwise documented below in the visit note. 

## 2016-10-18 NOTE — Patient Instructions (Signed)
We will set up referral for holter/event cardiac monitor.

## 2016-10-18 NOTE — Progress Notes (Signed)
Subjective:     Patient ID: Ashley Castaneda, female   DOB: [birth year], 66 y.o.   MRN: [id number]  HPI Patient has complex past medical history. She has history of migraine headaches, history of DVT, GERD, history of trigeminal neuralgia, osteoarthritis, occipital neuralgia, fibromyalgia, obesity, hyperlipidemia.  She's been followed by multiple specialists and states that she was recently diagnosed with "autonomic neuropathy " by specialist at [facility]. She's had several episodes of sensation of "heart racing". When she's had these episodes she's had some associated dizziness and lightheadedness but no syncope. No chest pain. When she has taken her pulse is usually in the 80s which she states is fast for her. Minimal caffeine use. Symptoms very intermittent. Seems worse with movement. No alleviating factors.  She had TSH a ago which was normal.  No exertional symptoms.  Past Medical History:  Diagnosis Date  . Adenomatous colon polyp 09/2008  . ALLERGIC RHINITIS 07/01/2008  . ANXIETY 07/01/2008  . BACK PAIN, THORACIC REGION 03/04/2010  . BRONCHITIS, CHRONIC 07/01/2008  . CARPAL TUNNEL SYNDROME, HX OF 07/01/2008  . CHRONIC OBSTRUCTIVE PULMONARY DISEASE, ACUTE EXACERBATION 08/07/2010  . DEPRESSION 07/01/2008  . DVT, HX OF 07/01/2008  . ECZEMA 05/29/2009  . FACIAL PAIN 12/25/2009  . FIBROMYALGIA 07/01/2008  . GERD 07/01/2008  . Hiatal hernia   . Hyperlipidemia   . LEG PAIN, BILATERAL 07/01/2008  . MIGRAINES, HX OF 07/01/2008  . OCCIPITAL NEURALGIA 07/01/2008  . OSTEOARTHRITIS 07/01/2008  . Other specified trigeminal nerve disorders 07/01/2008  . REFLEX SYMPATHETIC DYSTROPHY 07/01/2008  . Trigeminal neuralgia 05/21/2009  . Unspecified vitamin D deficiency 03/31/2010   Past Surgical History:  Procedure Laterality Date  . CARPAL TUNNEL RELEASE  2009   RIGHT  . HEAD CRANIECTOMY  1994   MICROVASULAR DECOMPRESSION  . LAVH     BSO  . LEFT KNEE SURGERY  1989  . OCCIPITAL NERVE STIMULATOR INSERTION    .  RIGHT FOOT BONE SPUR  1987  . RIGHT FOOT SURGERY  1998   BAD CUT  . RIGHT JAW SURGERY  2002  . RIGHT KNEE SURGERY  1990  . [place]  . RIGHT THUMB SUGERY  2004  . TONSILLECTOMY  1957    reports that she quit smoking about 4 years ago. Her smoking use included Cigarettes. She has a 60.00 pack-year smoking history. She uses smokeless tobacco. She reports that she does not drink alcohol or use drugs. family history includes Heart attack in her father and maternal grandmother; Hypertension in her father; Ovarian cancer (age of onset: 20) in her mother; Prostate cancer in her father; Stroke in her paternal grandmother. Allergies  Allergen Reactions  . Cefazolin     Ancef - in surgery, swelled up, turned blue, heart problems  . Carbamazepine Other (See Comments)    REACTION: confusion  . Venlafaxine Other (See Comments)  . Baclofen     REACTION: confusion  . Ceclor [Cefaclor]     hives  . Duloxetine Other (See Comments)  . Fentanyl     REACTION: nausea and vomiting  . Gabapentin     REACTION: confusion  . Milnacipran Other (See Comments)  . Oxcarbazepine     REACTION: confusion  . Pregabalin Other (See Comments)  . Topiramate Other (See Comments)  . Zonisamide Other (See Comments)     Review of Systems  Constitutional: Positive for fatigue. Negative for chills and fever.  Respiratory: Negative for cough and shortness of breath.  Cardiovascular: Positive for palpitations. Negative for chest pain and leg swelling.  Gastrointestinal: Negative for abdominal pain, nausea and vomiting.  Genitourinary: Negative for dysuria.  Neurological: Positive for weakness and light-headedness. Negative for syncope and speech difficulty.       Objective:   Physical Exam  Constitutional: She is oriented to person, place, and time. She appears well-developed and well-nourished. No distress.  Neck: Neck supple.  Cardiovascular: Normal rate and regular  rhythm.   Pulmonary/Chest: Effort normal and breath sounds normal. No respiratory distress. She has no wheezes. She has no rales.  Musculoskeletal: She exhibits no edema.  Neurological: She is alert and oriented to person, place, and time. No cranial nerve deficit.  Skin: No rash noted.  Psychiatric: She has a normal mood and affect. Her behavior is normal.       Assessment:     Patient resents with subjective palpitations. Previous event monitoring has been unremarkable - a few years ago. Standing blood pressure today 124/78 with no orthostatic change    Plan:     -Check labs with TSH and basic metabolic panel -EKG today shows normal sinus rhythm with no acute changes -minimize caffeine intake. -Set up cardiac event monitor for further evaluation -Follow-up immediately for any syncope or worsening symptoms  Eulas Post MD [facility] at [facility]

## 2016-10-19 DIAGNOSIS — M5416 Radiculopathy, lumbar region: Secondary | ICD-10-CM | POA: Diagnosis not present

## 2016-10-20 DIAGNOSIS — Z7982 Long term (current) use of aspirin: Secondary | ICD-10-CM | POA: Diagnosis not present

## 2016-10-20 DIAGNOSIS — Z881 Allergy status to other antibiotic agents status: Secondary | ICD-10-CM | POA: Diagnosis not present

## 2016-10-20 DIAGNOSIS — G508 Other disorders of trigeminal nerve: Secondary | ICD-10-CM | POA: Diagnosis not present

## 2016-10-20 DIAGNOSIS — G4451 Hemicrania continua: Secondary | ICD-10-CM | POA: Diagnosis not present

## 2016-10-20 DIAGNOSIS — H04123 Dry eye syndrome of bilateral lacrimal glands: Secondary | ICD-10-CM | POA: Diagnosis not present

## 2016-10-20 DIAGNOSIS — Z79899 Other long term (current) drug therapy: Secondary | ICD-10-CM | POA: Diagnosis not present

## 2016-10-20 DIAGNOSIS — H029 Unspecified disorder of eyelid: Secondary | ICD-10-CM | POA: Diagnosis not present

## 2016-10-20 DIAGNOSIS — H01003 Unspecified blepharitis right eye, unspecified eyelid: Secondary | ICD-10-CM | POA: Diagnosis not present

## 2016-10-20 DIAGNOSIS — H11442 Conjunctival cysts, left eye: Secondary | ICD-10-CM | POA: Diagnosis not present

## 2016-10-20 DIAGNOSIS — H11002 Unspecified pterygium of left eye: Secondary | ICD-10-CM | POA: Diagnosis not present

## 2016-10-20 DIAGNOSIS — H11823 Conjunctivochalasis, bilateral: Secondary | ICD-10-CM | POA: Diagnosis not present

## 2016-10-20 DIAGNOSIS — Z87891 Personal history of nicotine dependence: Secondary | ICD-10-CM | POA: Diagnosis not present

## 2016-10-20 DIAGNOSIS — H5203 Hypermetropia, bilateral: Secondary | ICD-10-CM | POA: Diagnosis not present

## 2016-10-20 DIAGNOSIS — Z9181 History of falling: Secondary | ICD-10-CM | POA: Diagnosis not present

## 2016-10-20 DIAGNOSIS — Z888 Allergy status to other drugs, medicaments and biological substances status: Secondary | ICD-10-CM | POA: Diagnosis not present

## 2016-10-20 DIAGNOSIS — Z86718 Personal history of other venous thrombosis and embolism: Secondary | ICD-10-CM | POA: Diagnosis not present

## 2016-10-20 DIAGNOSIS — H0289 Other specified disorders of eyelid: Secondary | ICD-10-CM | POA: Diagnosis not present

## 2016-10-20 DIAGNOSIS — H52203 Unspecified astigmatism, bilateral: Secondary | ICD-10-CM | POA: Diagnosis not present

## 2016-10-20 DIAGNOSIS — H01006 Unspecified blepharitis left eye, unspecified eyelid: Secondary | ICD-10-CM | POA: Diagnosis not present

## 2016-10-20 DIAGNOSIS — H2513 Age-related nuclear cataract, bilateral: Secondary | ICD-10-CM | POA: Diagnosis not present

## 2016-10-20 DIAGNOSIS — G500 Trigeminal neuralgia: Secondary | ICD-10-CM | POA: Diagnosis not present

## 2016-10-20 DIAGNOSIS — Z885 Allergy status to narcotic agent status: Secondary | ICD-10-CM | POA: Diagnosis not present

## 2016-10-20 DIAGNOSIS — H02831 Dermatochalasis of right upper eyelid: Secondary | ICD-10-CM | POA: Diagnosis not present

## 2016-10-20 DIAGNOSIS — H02834 Dermatochalasis of left upper eyelid: Secondary | ICD-10-CM | POA: Diagnosis not present

## 2016-10-20 DIAGNOSIS — Z886 Allergy status to analgesic agent status: Secondary | ICD-10-CM | POA: Diagnosis not present

## 2016-11-02 DIAGNOSIS — M47816 Spondylosis without myelopathy or radiculopathy, lumbar region: Secondary | ICD-10-CM | POA: Diagnosis not present

## 2016-11-08 DIAGNOSIS — M5481 Occipital neuralgia: Secondary | ICD-10-CM | POA: Diagnosis not present

## 2016-11-08 DIAGNOSIS — M62838 Other muscle spasm: Secondary | ICD-10-CM | POA: Diagnosis not present

## 2016-11-08 DIAGNOSIS — G509 Disorder of trigeminal nerve, unspecified: Secondary | ICD-10-CM | POA: Diagnosis not present

## 2016-11-08 DIAGNOSIS — G43719 Chronic migraine without aura, intractable, without status migrainosus: Secondary | ICD-10-CM | POA: Diagnosis not present

## 2016-11-08 DIAGNOSIS — M792 Neuralgia and neuritis, unspecified: Secondary | ICD-10-CM | POA: Diagnosis not present

## 2016-11-15 ENCOUNTER — Other Ambulatory Visit: Payer: Self-pay | Admitting: Family Medicine

## 2016-11-15 ENCOUNTER — Ambulatory Visit (INDEPENDENT_AMBULATORY_CARE_PROVIDER_SITE_OTHER): Payer: Medicare Other

## 2016-11-15 DIAGNOSIS — R42 Dizziness and giddiness: Secondary | ICD-10-CM

## 2016-11-15 DIAGNOSIS — R002 Palpitations: Secondary | ICD-10-CM

## 2016-11-22 DIAGNOSIS — Z888 Allergy status to other drugs, medicaments and biological substances status: Secondary | ICD-10-CM | POA: Diagnosis not present

## 2016-11-22 DIAGNOSIS — H01021 Squamous blepharitis right upper eyelid: Secondary | ICD-10-CM | POA: Diagnosis not present

## 2016-11-22 DIAGNOSIS — H52203 Unspecified astigmatism, bilateral: Secondary | ICD-10-CM | POA: Diagnosis not present

## 2016-11-22 DIAGNOSIS — H029 Unspecified disorder of eyelid: Secondary | ICD-10-CM | POA: Diagnosis not present

## 2016-11-22 DIAGNOSIS — H11002 Unspecified pterygium of left eye: Secondary | ICD-10-CM | POA: Diagnosis not present

## 2016-11-22 DIAGNOSIS — H01024 Squamous blepharitis left upper eyelid: Secondary | ICD-10-CM | POA: Diagnosis not present

## 2016-11-22 DIAGNOSIS — H5203 Hypermetropia, bilateral: Secondary | ICD-10-CM | POA: Diagnosis not present

## 2016-11-22 DIAGNOSIS — Z87891 Personal history of nicotine dependence: Secondary | ICD-10-CM | POA: Diagnosis not present

## 2016-11-22 DIAGNOSIS — H11442 Conjunctival cysts, left eye: Secondary | ICD-10-CM | POA: Diagnosis not present

## 2016-11-22 DIAGNOSIS — Z79899 Other long term (current) drug therapy: Secondary | ICD-10-CM | POA: Diagnosis not present

## 2016-11-22 DIAGNOSIS — H1013 Acute atopic conjunctivitis, bilateral: Secondary | ICD-10-CM | POA: Diagnosis not present

## 2016-11-22 DIAGNOSIS — G500 Trigeminal neuralgia: Secondary | ICD-10-CM | POA: Diagnosis not present

## 2016-11-22 DIAGNOSIS — H01022 Squamous blepharitis right lower eyelid: Secondary | ICD-10-CM | POA: Diagnosis not present

## 2016-11-22 DIAGNOSIS — H04123 Dry eye syndrome of bilateral lacrimal glands: Secondary | ICD-10-CM | POA: Diagnosis not present

## 2016-11-22 DIAGNOSIS — G4451 Hemicrania continua: Secondary | ICD-10-CM | POA: Diagnosis not present

## 2016-11-22 DIAGNOSIS — Z881 Allergy status to other antibiotic agents status: Secondary | ICD-10-CM | POA: Diagnosis not present

## 2016-11-22 DIAGNOSIS — Z886 Allergy status to analgesic agent status: Secondary | ICD-10-CM | POA: Diagnosis not present

## 2016-11-22 DIAGNOSIS — Z7982 Long term (current) use of aspirin: Secondary | ICD-10-CM | POA: Diagnosis not present

## 2016-11-22 DIAGNOSIS — H04122 Dry eye syndrome of left lacrimal gland: Secondary | ICD-10-CM | POA: Diagnosis not present

## 2016-11-22 DIAGNOSIS — H01025 Squamous blepharitis left lower eyelid: Secondary | ICD-10-CM | POA: Diagnosis not present

## 2016-11-25 DIAGNOSIS — G44021 Chronic cluster headache, intractable: Secondary | ICD-10-CM | POA: Diagnosis not present

## 2016-11-25 DIAGNOSIS — M545 Low back pain: Secondary | ICD-10-CM | POA: Diagnosis not present

## 2016-11-25 DIAGNOSIS — Z79899 Other long term (current) drug therapy: Secondary | ICD-10-CM | POA: Diagnosis not present

## 2016-11-25 DIAGNOSIS — Z5181 Encounter for therapeutic drug level monitoring: Secondary | ICD-10-CM | POA: Diagnosis not present

## 2016-11-25 DIAGNOSIS — M542 Cervicalgia: Secondary | ICD-10-CM | POA: Diagnosis not present

## 2016-11-25 DIAGNOSIS — G500 Trigeminal neuralgia: Secondary | ICD-10-CM | POA: Diagnosis not present

## 2016-11-26 DIAGNOSIS — M47816 Spondylosis without myelopathy or radiculopathy, lumbar region: Secondary | ICD-10-CM | POA: Diagnosis not present

## 2016-12-02 ENCOUNTER — Telehealth: Payer: Self-pay | Admitting: Family Medicine

## 2016-12-02 NOTE — Telephone Encounter (Signed)
° ° °  Pt call to say she has been wearing a heart monitor since April and she has broke out in a rash and is using sensitive skins and is not helping. It is in the general area where she put the patches. She is thinking that she may  be  Allergic to the adhensive.. Would like a call back .

## 2016-12-03 DIAGNOSIS — M791 Myalgia: Secondary | ICD-10-CM | POA: Diagnosis not present

## 2016-12-03 DIAGNOSIS — G894 Chronic pain syndrome: Secondary | ICD-10-CM | POA: Diagnosis not present

## 2016-12-03 DIAGNOSIS — R51 Headache: Secondary | ICD-10-CM | POA: Diagnosis not present

## 2016-12-03 MED ORDER — TRIAMCINOLONE ACETONIDE 0.1 % EX CREA: 1.0000 "application " | TOPICAL_CREAM | Freq: Two times a day (BID) | CUTANEOUS | 0 refills | Status: DC | PRN

## 2016-12-03 NOTE — Telephone Encounter (Signed)
Send in some triamcinolone 0.1% cream to use twice daily as needed 15 g with no refill

## 2016-12-03 NOTE — Telephone Encounter (Signed)
I called the pt and informed her of the message below and she is aware the Rx was sent to [facility].

## 2016-12-10 ENCOUNTER — Telehealth: Payer: Self-pay | Admitting: Family Medicine

## 2016-12-10 MED ORDER — MELOXICAM 15 MG PO TABS: 15.0000 mg | ORAL_TABLET | Freq: Every day | ORAL | 1 refills | Status: DC

## 2016-12-10 MED ORDER — POTASSIUM CHLORIDE ER 10 MEQ PO TBCR: EXTENDED_RELEASE_TABLET | ORAL | 1 refills | Status: DC

## 2016-12-10 MED ORDER — RABEPRAZOLE SODIUM 20 MG PO TBEC: 20.0000 mg | DELAYED_RELEASE_TABLET | Freq: Two times a day (BID) | ORAL | 1 refills | Status: DC

## 2016-12-10 MED ORDER — MONTELUKAST SODIUM 10 MG PO TABS: ORAL_TABLET | ORAL | 1 refills | Status: DC

## 2016-12-10 MED ORDER — ALPRAZOLAM 0.5 MG PO TABS: ORAL_TABLET | ORAL | 0 refills | Status: DC

## 2016-12-10 NOTE — Telephone Encounter (Signed)
Last rx given on 12/12 for #90 with no ref

## 2016-12-10 NOTE — Telephone Encounter (Signed)
Refill Alprazolam once.

## 2016-12-10 NOTE — Telephone Encounter (Signed)
Printed Rxs faxed to [facility] at [phone].

## 2016-12-10 NOTE — Telephone Encounter (Signed)
Pt need new Rx for alprazolam 0.5 mg, meloxicm 15 mg, montelukast 10 mg, potassium chloride 10MEQ and rabeprazole 20 MG.    Pharm:  [facility]

## 2016-12-30 DIAGNOSIS — G894 Chronic pain syndrome: Secondary | ICD-10-CM | POA: Diagnosis not present

## 2016-12-30 DIAGNOSIS — G44021 Chronic cluster headache, intractable: Secondary | ICD-10-CM | POA: Diagnosis not present

## 2016-12-30 DIAGNOSIS — G500 Trigeminal neuralgia: Secondary | ICD-10-CM | POA: Diagnosis not present

## 2017-01-03 DIAGNOSIS — H01021 Squamous blepharitis right upper eyelid: Secondary | ICD-10-CM | POA: Diagnosis not present

## 2017-01-03 DIAGNOSIS — H01024 Squamous blepharitis left upper eyelid: Secondary | ICD-10-CM | POA: Diagnosis not present

## 2017-01-03 DIAGNOSIS — H01022 Squamous blepharitis right lower eyelid: Secondary | ICD-10-CM | POA: Diagnosis not present

## 2017-01-03 DIAGNOSIS — Z888 Allergy status to other drugs, medicaments and biological substances status: Secondary | ICD-10-CM | POA: Diagnosis not present

## 2017-01-03 DIAGNOSIS — Z87891 Personal history of nicotine dependence: Secondary | ICD-10-CM | POA: Diagnosis not present

## 2017-01-03 DIAGNOSIS — H43811 Vitreous degeneration, right eye: Secondary | ICD-10-CM | POA: Diagnosis not present

## 2017-01-03 DIAGNOSIS — Z7982 Long term (current) use of aspirin: Secondary | ICD-10-CM | POA: Diagnosis not present

## 2017-01-03 DIAGNOSIS — Z86718 Personal history of other venous thrombosis and embolism: Secondary | ICD-10-CM | POA: Diagnosis not present

## 2017-01-03 DIAGNOSIS — H0289 Other specified disorders of eyelid: Secondary | ICD-10-CM | POA: Diagnosis not present

## 2017-01-03 DIAGNOSIS — H1013 Acute atopic conjunctivitis, bilateral: Secondary | ICD-10-CM | POA: Diagnosis not present

## 2017-01-03 DIAGNOSIS — H01025 Squamous blepharitis left lower eyelid: Secondary | ICD-10-CM | POA: Diagnosis not present

## 2017-01-03 DIAGNOSIS — H5203 Hypermetropia, bilateral: Secondary | ICD-10-CM | POA: Diagnosis not present

## 2017-01-03 DIAGNOSIS — Z885 Allergy status to narcotic agent status: Secondary | ICD-10-CM | POA: Diagnosis not present

## 2017-01-03 DIAGNOSIS — H02831 Dermatochalasis of right upper eyelid: Secondary | ICD-10-CM | POA: Diagnosis not present

## 2017-01-03 DIAGNOSIS — H0259 Other disorders affecting eyelid function: Secondary | ICD-10-CM | POA: Diagnosis not present

## 2017-01-03 DIAGNOSIS — H11002 Unspecified pterygium of left eye: Secondary | ICD-10-CM | POA: Diagnosis not present

## 2017-01-03 DIAGNOSIS — Z886 Allergy status to analgesic agent status: Secondary | ICD-10-CM | POA: Diagnosis not present

## 2017-01-03 DIAGNOSIS — H02834 Dermatochalasis of left upper eyelid: Secondary | ICD-10-CM | POA: Diagnosis not present

## 2017-01-03 DIAGNOSIS — Z881 Allergy status to other antibiotic agents status: Secondary | ICD-10-CM | POA: Diagnosis not present

## 2017-01-03 DIAGNOSIS — H52203 Unspecified astigmatism, bilateral: Secondary | ICD-10-CM | POA: Diagnosis not present

## 2017-01-03 DIAGNOSIS — H11442 Conjunctival cysts, left eye: Secondary | ICD-10-CM | POA: Diagnosis not present

## 2017-01-03 DIAGNOSIS — H11823 Conjunctivochalasis, bilateral: Secondary | ICD-10-CM | POA: Diagnosis not present

## 2017-01-03 DIAGNOSIS — H532 Diplopia: Secondary | ICD-10-CM | POA: Diagnosis not present

## 2017-01-03 DIAGNOSIS — H25813 Combined forms of age-related cataract, bilateral: Secondary | ICD-10-CM | POA: Diagnosis not present

## 2017-01-03 DIAGNOSIS — H04123 Dry eye syndrome of bilateral lacrimal glands: Secondary | ICD-10-CM | POA: Diagnosis not present

## 2017-01-03 DIAGNOSIS — Z79899 Other long term (current) drug therapy: Secondary | ICD-10-CM | POA: Diagnosis not present

## 2017-01-10 DIAGNOSIS — G500 Trigeminal neuralgia: Secondary | ICD-10-CM | POA: Diagnosis not present

## 2017-01-10 DIAGNOSIS — M542 Cervicalgia: Secondary | ICD-10-CM | POA: Diagnosis not present

## 2017-01-10 DIAGNOSIS — G44021 Chronic cluster headache, intractable: Secondary | ICD-10-CM | POA: Diagnosis not present

## 2017-01-10 DIAGNOSIS — G894 Chronic pain syndrome: Secondary | ICD-10-CM | POA: Diagnosis not present

## 2017-01-31 DIAGNOSIS — G894 Chronic pain syndrome: Secondary | ICD-10-CM | POA: Diagnosis not present

## 2017-01-31 DIAGNOSIS — G44021 Chronic cluster headache, intractable: Secondary | ICD-10-CM | POA: Diagnosis not present

## 2017-01-31 DIAGNOSIS — M5481 Occipital neuralgia: Secondary | ICD-10-CM | POA: Diagnosis not present

## 2017-01-31 DIAGNOSIS — M25511 Pain in right shoulder: Secondary | ICD-10-CM | POA: Diagnosis not present

## 2017-02-11 ENCOUNTER — Telehealth: Payer: Self-pay | Admitting: Family Medicine

## 2017-02-11 MED ORDER — ALPRAZOLAM 0.5 MG PO TABS: 0.5000 mg | ORAL_TABLET | Freq: Two times a day (BID) | ORAL | 0 refills | Status: DC

## 2017-02-11 NOTE — Telephone Encounter (Signed)
Left message for patient to return our call.

## 2017-02-11 NOTE — Telephone Encounter (Signed)
Refill for one po bid.

## 2017-02-11 NOTE — Telephone Encounter (Signed)
Prescription was faxed to [facility] per patient request.

## 2017-02-11 NOTE — Telephone Encounter (Signed)
Pt calling stating that her Xanax .50 MG dosage was incorrect it should be 3 a day for 3 months.   Pharm:  [facility]

## 2017-02-11 NOTE — Telephone Encounter (Signed)
We would like to taper back/off- if possible.  Refill once and set up follow up to discuss in next 1-2 months.

## 2017-02-11 NOTE — Telephone Encounter (Signed)
Last refill 12/10/16 - Refill Alprazolam once.  Last office visit 10/18/16.  Okay to refill?

## 2017-02-11 NOTE — Telephone Encounter (Signed)
Spoke with patient and she takes Xanax for her headaches. She takes 2 tabs a day.  Okay to fill with 2 tabs a day? She has tapered from 3 times a day. She has scheduled a follow up visit 02/16/17.

## 2017-02-16 ENCOUNTER — Encounter: Payer: Self-pay | Admitting: Family Medicine

## 2017-02-16 ENCOUNTER — Ambulatory Visit (INDEPENDENT_AMBULATORY_CARE_PROVIDER_SITE_OTHER): Payer: Medicare Other | Admitting: Family Medicine

## 2017-02-16 VITALS — BP 110/80 | HR 77 | Temp 98.1°F | Wt 243.1 lb

## 2017-02-16 DIAGNOSIS — G43909 Migraine, unspecified, not intractable, without status migrainosus: Secondary | ICD-10-CM

## 2017-02-16 DIAGNOSIS — R600 Localized edema: Secondary | ICD-10-CM | POA: Diagnosis not present

## 2017-02-16 DIAGNOSIS — F411 Generalized anxiety disorder: Secondary | ICD-10-CM

## 2017-02-16 DIAGNOSIS — J309 Allergic rhinitis, unspecified: Secondary | ICD-10-CM

## 2017-02-16 MED ORDER — POTASSIUM CHLORIDE ER 10 MEQ PO TBCR: EXTENDED_RELEASE_TABLET | ORAL | 3 refills | Status: DC

## 2017-02-16 MED ORDER — RABEPRAZOLE SODIUM 20 MG PO TBEC: 20.0000 mg | DELAYED_RELEASE_TABLET | Freq: Two times a day (BID) | ORAL | 3 refills | Status: DC

## 2017-02-16 MED ORDER — MONTELUKAST SODIUM 10 MG PO TABS
ORAL_TABLET | ORAL | 3 refills | Status: DC
Start: 2017-02-16 — End: 2018-04-25

## 2017-02-16 MED ORDER — MELOXICAM 15 MG PO TABS: 15.0000 mg | ORAL_TABLET | Freq: Every day | ORAL | 3 refills | Status: DC

## 2017-02-16 MED ORDER — SILODOSIN 8 MG PO CAPS
8.0000 mg | ORAL_CAPSULE | Freq: Every day | ORAL | 3 refills | Status: DC
Start: 2017-02-16 — End: 2017-05-20

## 2017-02-16 MED ORDER — FLUTICASONE PROPIONATE 50 MCG/ACT NA SUSP: 2.0000 | Freq: Every day | NASAL | 3 refills | Status: DC | PRN

## 2017-02-16 MED ORDER — TORSEMIDE 10 MG PO TABS
10.0000 mg | ORAL_TABLET | Freq: Every day | ORAL | 3 refills | Status: DC
Start: 2017-02-16 — End: 2018-04-25

## 2017-02-16 NOTE — Patient Instructions (Signed)
Try leaving off the Demadex Get daily weights. If weight goes up more than 3 pounds in one day or 5 pounds in one week (or if leg edema increases) go back on the medication.

## 2017-02-16 NOTE — Progress Notes (Addendum)
Subjective:     Patient ID: Ashley Castaneda, female   DOB: 1951/05/11, 66 y.o.   MRN: [id number]  HPI Patient seen for medical follow-up. She has very complicated past medical history and sees multiple specialists. She's had remote history of DVT, migraine headaches, GERD, chronic neuropathy, reported trigeminal neuralgia, occipital neuralgia, fibromyalgia, chronic anxiety depression, hyperlipidemia, obesity, peripheral edema. She apparently was recently started on new pain medication with ketamine infusion by her pain management specialist and is looking at starting [place].    She's been on low-dose alprazolam for about 20 some years. Recently requesting refill. Usually takes 1 at night and one half tablet in the morning and occasionally a half tablet midday. Needs several medication refills. She is on Rapaflo for your urgency and requesting refill. She also needs refills of Flonase, meloxicam, Singulair, torsemide, and AcipHex.  She had severe leg edema last year. Not responding the Lasix and responded very well to torsemide. She was doing excellent job of weight loss but now has gone back up. Note denies any dyspnea or orthopnea.  Past Medical History:  Diagnosis Date  . Adenomatous colon polyp 09/2008  . ALLERGIC RHINITIS 07/01/2008  . ANXIETY 07/01/2008  . BACK PAIN, THORACIC REGION 03/04/2010  . BRONCHITIS, CHRONIC 07/01/2008  . CARPAL TUNNEL SYNDROME, HX OF 07/01/2008  . CHRONIC OBSTRUCTIVE PULMONARY DISEASE, ACUTE EXACERBATION 08/07/2010  . DEPRESSION 07/01/2008  . DVT, HX OF 07/01/2008  . ECZEMA 05/29/2009  . FACIAL PAIN 12/25/2009  . FIBROMYALGIA 07/01/2008  . GERD 07/01/2008  . Hiatal hernia   . Hyperlipidemia   . LEG PAIN, BILATERAL 07/01/2008  . MIGRAINES, HX OF 07/01/2008  . OCCIPITAL NEURALGIA 07/01/2008  . OSTEOARTHRITIS 07/01/2008  . Other specified trigeminal nerve disorders 07/01/2008  . REFLEX SYMPATHETIC DYSTROPHY 07/01/2008  . Trigeminal neuralgia 05/21/2009  . Unspecified vitamin D  deficiency 03/31/2010   Past Surgical History:  Procedure Laterality Date  . CARPAL TUNNEL RELEASE  2009   RIGHT  . HEAD CRANIECTOMY  1994   MICROVASULAR DECOMPRESSION  . LAVH     BSO  . LEFT KNEE SURGERY  1989  . OCCIPITAL NERVE STIMULATOR INSERTION    . RIGHT FOOT BONE SPUR  1987  . RIGHT FOOT SURGERY  1998   BAD CUT  . RIGHT JAW SURGERY  2002  . RIGHT KNEE SURGERY  1990  . [place]  . RIGHT THUMB SUGERY  2004  . TONSILLECTOMY  1957    reports that she quit smoking about 5 years ago. Her smoking use included Cigarettes. She has a 60.00 pack-year smoking history. She uses smokeless tobacco. She reports that she does not drink alcohol or use drugs. family history includes Heart attack in her father and maternal grandmother; Hypertension in her father; Ovarian cancer (age of onset: 107) in her mother; Prostate cancer in her father; Stroke in her paternal grandmother. Allergies  Allergen Reactions  . Cefazolin     Ancef - in surgery, swelled up, turned blue, heart problems  . Carbamazepine Other (See Comments)    REACTION: confusion  . Venlafaxine Other (See Comments)  . Baclofen     REACTION: confusion  . Ceclor [Cefaclor]     hives  . Duloxetine Other (See Comments)  . Fentanyl     REACTION: nausea and vomiting  . Gabapentin     REACTION: confusion  . Milnacipran Other (See Comments)  . Oxcarbazepine     REACTION: confusion  . Pregabalin Other (See  Comments)  . Topiramate Other (See Comments)  . Zonisamide Other (See Comments)     Review of Systems  Constitutional: Positive for fatigue. Negative for appetite change and unexpected weight change.  Eyes: Negative for visual disturbance.  Respiratory: Negative for cough, chest tightness, shortness of breath and wheezing.   Cardiovascular: Negative for chest pain, palpitations and leg swelling.  Gastrointestinal: Negative for abdominal pain.  Neurological: Positive for headaches.  Negative for dizziness, seizures, syncope and light-headedness.  Psychiatric/Behavioral: Negative for confusion.       Objective:   Physical Exam  Constitutional: She is oriented to person, place, and time. She appears well-developed and well-nourished.  Eyes: Pupils are equal, round, and reactive to light.  Neck: Neck supple. No JVD present. No thyromegaly present.  Cardiovascular: Normal rate and regular rhythm.  Exam reveals no gallop.   Pulmonary/Chest: Effort normal and breath sounds normal. No respiratory distress. She has no wheezes. She has no rales.  Musculoskeletal: She exhibits no edema.  Neurological: She is alert and oriented to person, place, and time. No cranial nerve deficit.       Assessment:     #1 history of some chronic bilateral leg edema currently stable  #2 history of seasonal and perennial allergies stable  #3 urinary urgency  #4 history of chronic anxiety  #5 history of reported autonomic neuropathy disorder  #6 history of complex headaches    Plan:     -Refilled multiple medications as above. -Schedule complete physical. We'll plan follow-up labs at physical. Needs vaccinations including Prevnar 13 -Discussed benzodiazepines. We recommend minimizing use of much as possible. -try reducing Demadex to one half tablet daily and then d/c after several days if weight/edema stable.  Eulas Post MD [facility] at [facility]

## 2017-02-20 DIAGNOSIS — S93491A Sprain of other ligament of right ankle, initial encounter: Secondary | ICD-10-CM | POA: Diagnosis not present

## 2017-02-22 DIAGNOSIS — G894 Chronic pain syndrome: Secondary | ICD-10-CM | POA: Diagnosis not present

## 2017-02-28 DIAGNOSIS — S93491D Sprain of other ligament of right ankle, subsequent encounter: Secondary | ICD-10-CM | POA: Diagnosis not present

## 2017-03-01 DIAGNOSIS — M5481 Occipital neuralgia: Secondary | ICD-10-CM | POA: Diagnosis not present

## 2017-03-01 DIAGNOSIS — G894 Chronic pain syndrome: Secondary | ICD-10-CM | POA: Diagnosis not present

## 2017-03-01 DIAGNOSIS — G44021 Chronic cluster headache, intractable: Secondary | ICD-10-CM | POA: Diagnosis not present

## 2017-03-01 DIAGNOSIS — G500 Trigeminal neuralgia: Secondary | ICD-10-CM | POA: Diagnosis not present

## 2017-03-08 DIAGNOSIS — R51 Headache: Secondary | ICD-10-CM | POA: Diagnosis not present

## 2017-03-08 DIAGNOSIS — G44021 Chronic cluster headache, intractable: Secondary | ICD-10-CM | POA: Diagnosis not present

## 2017-03-08 DIAGNOSIS — G894 Chronic pain syndrome: Secondary | ICD-10-CM | POA: Diagnosis not present

## 2017-03-14 DIAGNOSIS — M5481 Occipital neuralgia: Secondary | ICD-10-CM | POA: Diagnosis not present

## 2017-03-14 DIAGNOSIS — G894 Chronic pain syndrome: Secondary | ICD-10-CM | POA: Diagnosis not present

## 2017-03-14 DIAGNOSIS — G44021 Chronic cluster headache, intractable: Secondary | ICD-10-CM | POA: Diagnosis not present

## 2017-03-14 DIAGNOSIS — M79671 Pain in right foot: Secondary | ICD-10-CM | POA: Diagnosis not present

## 2017-03-14 DIAGNOSIS — S93401A Sprain of unspecified ligament of right ankle, initial encounter: Secondary | ICD-10-CM | POA: Diagnosis not present

## 2017-03-14 DIAGNOSIS — M25571 Pain in right ankle and joints of right foot: Secondary | ICD-10-CM | POA: Diagnosis not present

## 2017-03-14 DIAGNOSIS — S93601A Unspecified sprain of right foot, initial encounter: Secondary | ICD-10-CM | POA: Diagnosis not present

## 2017-03-14 DIAGNOSIS — M25511 Pain in right shoulder: Secondary | ICD-10-CM | POA: Diagnosis not present

## 2017-04-04 DIAGNOSIS — G44021 Chronic cluster headache, intractable: Secondary | ICD-10-CM | POA: Diagnosis not present

## 2017-04-04 DIAGNOSIS — M542 Cervicalgia: Secondary | ICD-10-CM | POA: Diagnosis not present

## 2017-04-04 DIAGNOSIS — R51 Headache: Secondary | ICD-10-CM | POA: Diagnosis not present

## 2017-04-04 DIAGNOSIS — M5481 Occipital neuralgia: Secondary | ICD-10-CM | POA: Diagnosis not present

## 2017-04-06 DIAGNOSIS — M79671 Pain in right foot: Secondary | ICD-10-CM | POA: Diagnosis not present

## 2017-04-06 DIAGNOSIS — L03115 Cellulitis of right lower limb: Secondary | ICD-10-CM | POA: Diagnosis not present

## 2017-04-06 DIAGNOSIS — M79604 Pain in right leg: Secondary | ICD-10-CM | POA: Diagnosis not present

## 2017-04-06 DIAGNOSIS — M7989 Other specified soft tissue disorders: Secondary | ICD-10-CM | POA: Diagnosis not present

## 2017-04-06 DIAGNOSIS — G5761 Lesion of plantar nerve, right lower limb: Secondary | ICD-10-CM | POA: Diagnosis not present

## 2017-04-14 ENCOUNTER — Encounter: Payer: Self-pay | Admitting: Family Medicine

## 2017-04-14 DIAGNOSIS — G43719 Chronic migraine without aura, intractable, without status migrainosus: Secondary | ICD-10-CM | POA: Diagnosis not present

## 2017-04-14 DIAGNOSIS — M792 Neuralgia and neuritis, unspecified: Secondary | ICD-10-CM | POA: Diagnosis not present

## 2017-04-14 DIAGNOSIS — G529 Cranial nerve disorder, unspecified: Secondary | ICD-10-CM | POA: Diagnosis not present

## 2017-04-14 DIAGNOSIS — M5481 Occipital neuralgia: Secondary | ICD-10-CM | POA: Diagnosis not present

## 2017-05-06 DIAGNOSIS — M47816 Spondylosis without myelopathy or radiculopathy, lumbar region: Secondary | ICD-10-CM | POA: Diagnosis not present

## 2017-05-06 DIAGNOSIS — G894 Chronic pain syndrome: Secondary | ICD-10-CM | POA: Diagnosis not present

## 2017-05-20 ENCOUNTER — Encounter: Payer: Self-pay | Admitting: Family Medicine

## 2017-05-20 ENCOUNTER — Other Ambulatory Visit: Payer: Self-pay | Admitting: *Deleted

## 2017-05-20 ENCOUNTER — Ambulatory Visit (INDEPENDENT_AMBULATORY_CARE_PROVIDER_SITE_OTHER): Payer: Medicare Other | Admitting: Family Medicine

## 2017-05-20 VITALS — BP 112/72 | HR 96 | Temp 98.2°F | Ht 63.0 in

## 2017-05-20 DIAGNOSIS — R600 Localized edema: Secondary | ICD-10-CM | POA: Diagnosis not present

## 2017-05-20 DIAGNOSIS — F411 Generalized anxiety disorder: Secondary | ICD-10-CM

## 2017-05-20 DIAGNOSIS — H60543 Acute eczematoid otitis externa, bilateral: Secondary | ICD-10-CM

## 2017-05-20 LAB — BASIC METABOLIC PANEL
BUN: 24 mg/dL — ABNORMAL HIGH (ref 6–23)
CALCIUM: 9.2 mg/dL (ref 8.4–10.5)
CHLORIDE: 102 meq/L (ref 96–112)
CO2: 28 meq/L (ref 19–32)
Creatinine, Ser: 0.89 mg/dL (ref 0.40–1.20)
GFR: 67.57 mL/min (ref >60.00)
GLUCOSE: 98 mg/dL (ref 70–99)
Potassium: 4.6 mEq/L (ref 3.5–5.1)
SODIUM: 139 meq/L (ref 135–145)

## 2017-05-20 MED ORDER — TRIAMCINOLONE ACETONIDE 0.1 % EX CREA: 1.0000 "application " | TOPICAL_CREAM | Freq: Two times a day (BID) | CUTANEOUS | 3 refills | Status: DC | PRN

## 2017-05-20 MED ORDER — ALPRAZOLAM 0.5 MG PO TABS: 0.5000 mg | ORAL_TABLET | Freq: Two times a day (BID) | ORAL | 1 refills | Status: DC

## 2017-05-20 MED ORDER — MOMETASONE FUROATE 0.1 % EX OINT: TOPICAL_OINTMENT | Freq: Every day | CUTANEOUS | 3 refills | Status: DC

## 2017-05-20 NOTE — Progress Notes (Signed)
Subjective:     Patient ID: Ashley Castaneda, female   DOB: 29-Mar-1951, 66 y.o.   MRN: [id number]  HPI Patient's complex past medical history.  History of obesity, history of DVT, history of migraine headaches, COPD, GERD, trigeminal neuralgia, fibromyalgia, occipital neuralgia, chronic anxiety, hyperlipidemia. She is followed by multiple specialists. She is followed by headache specialist and recently started on new injectable-Aimovig which he states is helping her headache.  She has history of recurrent eczema ear canals and is requesting refill of Elocon topical.  Has frequent flaking and itching.  Has history of DVT. She has chronic bilateral leg edema. She has some recent right leg pain. Possibly mild edema right leg compared to left but this is somewhat chronic. She's tried resting her legs without much improvement. Frequent leg cramps which has been a similar complaint in the past. She had venous Doppler just a few months ago which was negative for acute DVT. Denies any dyspnea or chest pain or pleuritic pain.  Past Medical History:  Diagnosis Date  . Adenomatous colon polyp 09/2008  . ALLERGIC RHINITIS 07/01/2008  . ANXIETY 07/01/2008  . BACK PAIN, THORACIC REGION 03/04/2010  . BRONCHITIS, CHRONIC 07/01/2008  . CARPAL TUNNEL SYNDROME, HX OF 07/01/2008  . CHRONIC OBSTRUCTIVE PULMONARY DISEASE, ACUTE EXACERBATION 08/07/2010  . DEPRESSION 07/01/2008  . DVT, HX OF 07/01/2008  . ECZEMA 05/29/2009  . FACIAL PAIN 12/25/2009  . FIBROMYALGIA 07/01/2008  . GERD 07/01/2008  . Hiatal hernia   . Hyperlipidemia   . LEG PAIN, BILATERAL 07/01/2008  . MIGRAINES, HX OF 07/01/2008  . OCCIPITAL NEURALGIA 07/01/2008  . OSTEOARTHRITIS 07/01/2008  . Other specified trigeminal nerve disorders 07/01/2008  . REFLEX SYMPATHETIC DYSTROPHY 07/01/2008  . Trigeminal neuralgia 05/21/2009  . Unspecified vitamin D deficiency 03/31/2010   Past Surgical History:  Procedure Laterality Date  . CARPAL TUNNEL RELEASE  2009   RIGHT  .  HEAD CRANIECTOMY  1994   MICROVASULAR DECOMPRESSION  . LAVH     BSO  . LEFT KNEE SURGERY  1989  . OCCIPITAL NERVE STIMULATOR INSERTION    . RIGHT FOOT BONE SPUR  1987  . RIGHT FOOT SURGERY  1998   BAD CUT  . RIGHT JAW SURGERY  2002  . RIGHT KNEE SURGERY  1990  . [place]  . RIGHT THUMB SUGERY  2004  . TONSILLECTOMY  1957    reports that she quit smoking about 5 years ago. Her smoking use included Cigarettes. She has a 60.00 pack-year smoking history. She uses smokeless tobacco. She reports that she does not drink alcohol or use drugs. family history includes Heart attack in her father and maternal grandmother; Hypertension in her father; Ovarian cancer (age of onset: 58) in her mother; Prostate cancer in her father; Stroke in her paternal grandmother. Allergies  Allergen Reactions  . Cefazolin     Ancef - in surgery, swelled up, turned blue, heart problems  . Carbamazepine Other (See Comments)    REACTION: confusion  . Venlafaxine Other (See Comments)  . Baclofen     REACTION: confusion  . Ceclor [Cefaclor]     hives  . Duloxetine Other (See Comments)  . Fentanyl     REACTION: nausea and vomiting  . Gabapentin     REACTION: confusion  . Milnacipran Other (See Comments)  . Oxcarbazepine     REACTION: confusion  . Pregabalin Other (See Comments)  . Topiramate Other (See Comments)  . Zonisamide Other (See  Comments)     Review of Systems  Constitutional: Positive for fatigue.  Eyes: Negative for visual disturbance.  Respiratory: Negative for cough, chest tightness, shortness of breath and wheezing.   Cardiovascular: Negative for chest pain and palpitations.  Neurological: Negative for dizziness, seizures, syncope, weakness, light-headedness and headaches.       Objective:   Physical Exam  Constitutional: She is oriented to person, place, and time. She appears well-developed and well-nourished.  Cardiovascular: Normal rate and  regular rhythm.   Pulmonary/Chest: Effort normal and breath sounds normal. No respiratory distress. She has no wheezes. She has no rales.  Musculoskeletal:  She has history of chronic leg edema but actually no pitting edema day and improved from previously. No color changes in the lower extremities. Good capillary perfusion.  Neurological: She is alert and oriented to person, place, and time.       Assessment:     #1 history of bilateral leg edema. Suspect significant venous stasis history. Previously intolerant of compression stockings. Patient maintained on Demadex  #2 history of recurrent eczema ear canals  #3 chronic anxiety    Plan:     -Check basic metabolic panel -Refill alprazolam for 6 months. We have previously discussed trying to taper off this with concern for increased fall risk and potential for cognitive decline with aging -Refills of steroid lotion given -We again discussed recommendations for compression stockings but she declines. She's had difficulty getting these off and on in the past -Flu vaccine given  Eulas Post MD [facility] at [facility]

## 2017-05-23 DIAGNOSIS — M545 Low back pain: Secondary | ICD-10-CM | POA: Diagnosis not present

## 2017-05-23 DIAGNOSIS — G894 Chronic pain syndrome: Secondary | ICD-10-CM | POA: Diagnosis not present

## 2017-05-23 DIAGNOSIS — G44021 Chronic cluster headache, intractable: Secondary | ICD-10-CM | POA: Diagnosis not present

## 2017-05-23 DIAGNOSIS — M5481 Occipital neuralgia: Secondary | ICD-10-CM | POA: Diagnosis not present

## 2017-05-23 DIAGNOSIS — G8929 Other chronic pain: Secondary | ICD-10-CM | POA: Diagnosis not present

## 2017-05-31 DIAGNOSIS — M545 Low back pain: Secondary | ICD-10-CM | POA: Diagnosis not present

## 2017-05-31 DIAGNOSIS — G8929 Other chronic pain: Secondary | ICD-10-CM | POA: Diagnosis not present

## 2017-05-31 DIAGNOSIS — G894 Chronic pain syndrome: Secondary | ICD-10-CM | POA: Diagnosis not present

## 2017-05-31 DIAGNOSIS — M533 Sacrococcygeal disorders, not elsewhere classified: Secondary | ICD-10-CM | POA: Diagnosis not present

## 2017-06-06 DIAGNOSIS — M545 Low back pain: Secondary | ICD-10-CM | POA: Diagnosis not present

## 2017-06-06 DIAGNOSIS — M542 Cervicalgia: Secondary | ICD-10-CM | POA: Diagnosis not present

## 2017-06-06 DIAGNOSIS — G894 Chronic pain syndrome: Secondary | ICD-10-CM | POA: Diagnosis not present

## 2017-06-06 DIAGNOSIS — G8929 Other chronic pain: Secondary | ICD-10-CM | POA: Diagnosis not present

## 2017-06-24 DIAGNOSIS — J209 Acute bronchitis, unspecified: Secondary | ICD-10-CM | POA: Diagnosis not present

## 2017-06-28 DIAGNOSIS — G8929 Other chronic pain: Secondary | ICD-10-CM | POA: Diagnosis not present

## 2017-06-28 DIAGNOSIS — M545 Low back pain: Secondary | ICD-10-CM | POA: Diagnosis not present

## 2017-06-28 DIAGNOSIS — M5417 Radiculopathy, lumbosacral region: Secondary | ICD-10-CM | POA: Diagnosis not present

## 2017-06-28 DIAGNOSIS — G894 Chronic pain syndrome: Secondary | ICD-10-CM | POA: Diagnosis not present

## 2017-06-28 DIAGNOSIS — Z79899 Other long term (current) drug therapy: Secondary | ICD-10-CM | POA: Diagnosis not present

## 2017-06-28 DIAGNOSIS — Z5181 Encounter for therapeutic drug level monitoring: Secondary | ICD-10-CM | POA: Diagnosis not present

## 2017-07-06 DIAGNOSIS — M5417 Radiculopathy, lumbosacral region: Secondary | ICD-10-CM | POA: Diagnosis not present

## 2017-08-04 DIAGNOSIS — M7918 Myalgia, other site: Secondary | ICD-10-CM | POA: Diagnosis not present

## 2017-08-09 DIAGNOSIS — M5417 Radiculopathy, lumbosacral region: Secondary | ICD-10-CM | POA: Diagnosis not present

## 2017-08-09 DIAGNOSIS — M7918 Myalgia, other site: Secondary | ICD-10-CM | POA: Diagnosis not present

## 2017-08-09 DIAGNOSIS — G894 Chronic pain syndrome: Secondary | ICD-10-CM | POA: Diagnosis not present

## 2017-08-09 DIAGNOSIS — M542 Cervicalgia: Secondary | ICD-10-CM | POA: Diagnosis not present

## 2017-08-17 ENCOUNTER — Ambulatory Visit: Payer: Medicare Other | Admitting: Family Medicine

## 2017-08-17 ENCOUNTER — Encounter (HOSPITAL_COMMUNITY): Payer: Self-pay | Admitting: Emergency Medicine

## 2017-08-17 ENCOUNTER — Emergency Department (HOSPITAL_COMMUNITY)
Admission: EM | Admit: 2017-08-17 | Discharge: 2017-08-17 | Disposition: A | Discharge status: Home / Self Care | Payer: Medicare Other | Attending: Emergency Medicine | Admitting: Emergency Medicine

## 2017-08-17 ENCOUNTER — Other Ambulatory Visit: Payer: Self-pay

## 2017-08-17 DIAGNOSIS — J449 Chronic obstructive pulmonary disease, unspecified: Secondary | ICD-10-CM | POA: Insufficient documentation

## 2017-08-17 DIAGNOSIS — G8918 Other acute postprocedural pain: Secondary | ICD-10-CM | POA: Insufficient documentation

## 2017-08-17 DIAGNOSIS — Z79899 Other long term (current) drug therapy: Secondary | ICD-10-CM | POA: Diagnosis not present

## 2017-08-17 DIAGNOSIS — Z87891 Personal history of nicotine dependence: Secondary | ICD-10-CM | POA: Diagnosis not present

## 2017-08-17 DIAGNOSIS — M545 Low back pain: Secondary | ICD-10-CM | POA: Diagnosis not present

## 2017-08-17 LAB — CBC WITH DIFFERENTIAL/PLATELET
Basophils Absolute: 0 10*3/uL (ref 0.0–0.1)
Basophils Relative: 0 %
Eosinophils Absolute: 0.1 10*3/uL (ref 0.0–0.7)
Eosinophils Relative: 1 %
HCT: 37.8 % (ref 36.0–46.0)
Hemoglobin: 12.1 g/dL (ref 12.0–15.0)
Lymphocytes Relative: 19 %
Lymphs Abs: 1.8 10*3/uL (ref 0.7–4.0)
MCH: 30 pg (ref 26.0–34.0)
MCHC: 32 g/dL (ref 30.0–36.0)
MCV: 93.8 fL (ref 78.0–100.0)
Monocytes Absolute: 1 10*3/uL (ref 0.1–1.0)
Monocytes Relative: 10 %
Neutro Abs: 6.8 10*3/uL (ref 1.7–7.7)
Neutrophils Relative %: 70 %
Platelets: 362 10*3/uL (ref 150–400)
RBC: 4.03 MIL/uL (ref 3.87–5.11)
RDW: 13.1 % (ref 11.5–15.5)
WBC: 9.7 10*3/uL (ref 4.0–10.5)

## 2017-08-17 LAB — BASIC METABOLIC PANEL
Anion gap: 11 (ref 5–15)
BUN: 23 mg/dL — ABNORMAL HIGH (ref 6–20)
CO2: 25 mmol/L (ref 22–32)
Calcium: 8.9 mg/dL (ref 8.9–10.3)
Chloride: 102 mmol/L (ref 101–111)
Creatinine, Ser: 0.87 mg/dL (ref 0.44–1.00)
GFR calc Af Amer: >60 mL/min (ref >60)
GFR calc non Af Amer: >60 mL/min (ref >60)
Glucose, Bld: 113 mg/dL — ABNORMAL HIGH (ref 65–99)
Potassium: 4 mmol/L (ref 3.5–5.1)
Sodium: 138 mmol/L (ref 135–145)

## 2017-08-17 MED ORDER — HYDROMORPHONE HCL 1 MG/ML IJ SOLN
1.0000 mg | Freq: Once | INTRAMUSCULAR | Status: DC
  Filled 2017-08-17: qty 1

## 2017-08-17 NOTE — ED Triage Notes (Addendum)
Pt reports had spinal cord stimulator inserted on 07/06/17. Pt reports right hip pain at battery site. Pt reports was referred to ED for evaluation by surgeon. Pt denies fever. Pt reports site hot to touch. nad noted.   Minimal contusion noted to site. No redness, odor, or drainage noted at this time.

## 2017-08-17 NOTE — Discharge Instructions (Signed)
Follow-up with your surgeon to discuss the specifics of your surgery further.

## 2017-08-17 NOTE — ED Notes (Signed)
MD Kohut at bedside. 

## 2017-08-17 NOTE — ED Notes (Signed)
This RN went into room to provided IM medication, stated policy of 15 minute injection time. Pt states she does not wish to wait and wanted to leave. Pt ambulatory to nurses station asking for d/c paperwork, made pt aware that paperwork had not been finished at this time. Pt ambulatory to lobby with use of walker.

## 2017-08-19 DIAGNOSIS — R52 Pain, unspecified: Secondary | ICD-10-CM | POA: Diagnosis not present

## 2017-08-19 DIAGNOSIS — G43719 Chronic migraine without aura, intractable, without status migrainosus: Secondary | ICD-10-CM | POA: Diagnosis not present

## 2017-08-19 DIAGNOSIS — G500 Trigeminal neuralgia: Secondary | ICD-10-CM | POA: Diagnosis not present

## 2017-08-19 DIAGNOSIS — M5481 Occipital neuralgia: Secondary | ICD-10-CM | POA: Diagnosis not present

## 2017-08-19 DIAGNOSIS — M792 Neuralgia and neuritis, unspecified: Secondary | ICD-10-CM | POA: Diagnosis not present

## 2017-08-19 NOTE — ED Provider Notes (Signed)
[facility] EMERGENCY DEPARTMENT Provider Note   CSN: [id number] Arrival date & time: 08/17/17  1523     History   Chief Complaint Chief Complaint  Patient presents with  . Post-op Problem    HPI Ashley Castaneda is a 67 y.o. female.  HPI  56yF presenting for check of the battery for recently placed spinal stimulator.  It was placed at outside facility approximately 1 month ago.  She reports ongoing pain at the site of the battery.  She says she has spoken with her surgeon about this and they mention possibly hematoma or seroma.  She is requesting another opinion.  Past Medical History:  Diagnosis Date  . Adenomatous colon polyp 09/2008  . ALLERGIC RHINITIS 07/01/2008  . ANXIETY 07/01/2008  . BACK PAIN, THORACIC REGION 03/04/2010  . BRONCHITIS, CHRONIC 07/01/2008  . CARPAL TUNNEL SYNDROME, HX OF 07/01/2008  . CHRONIC OBSTRUCTIVE PULMONARY DISEASE, ACUTE EXACERBATION 08/07/2010  . DEPRESSION 07/01/2008  . DVT, HX OF 07/01/2008  . ECZEMA 05/29/2009  . FACIAL PAIN 12/25/2009  . FIBROMYALGIA 07/01/2008  . GERD 07/01/2008  . Hiatal hernia   . Hyperlipidemia   . LEG PAIN, BILATERAL 07/01/2008  . MIGRAINES, HX OF 07/01/2008  . OCCIPITAL NEURALGIA 07/01/2008  . OSTEOARTHRITIS 07/01/2008  . Other specified trigeminal nerve disorders 07/01/2008  . REFLEX SYMPATHETIC DYSTROPHY 07/01/2008  . Trigeminal neuralgia 05/21/2009  . Unspecified vitamin D deficiency 03/31/2010    Patient Active Problem List   Diagnosis Date Noted  . Obesity 10/08/2015  . Migraine headache 01/18/2014  . Dizziness and giddiness 01/18/2014  . Numbness and tingling of both legs 01/18/2014  . Severe obesity (BMI >= 40) ([place]) 06/14/2013  . Numbness and tingling in right hand 05/30/2013  . Hyperlipidemia 06/06/2012  . Fatigue 06/06/2012  . Dvt femoral (deep venous thrombosis) ([place]) 10/28/2011  . CHRONIC OBSTRUCTIVE PULMONARY DISEASE, ACUTE EXACERBATION 08/07/2010  . UNSPECIFIED VITAMIN D DEFICIENCY 03/31/2010  . BACK  PAIN, THORACIC REGION 03/04/2010  . ECZEMA 05/29/2009  . TRIGEMINAL NEURALGIA 05/21/2009  . Anxiety state 07/01/2008  . DEPRESSION 07/01/2008  . REFLEX SYMPATHETIC DYSTROPHY 07/01/2008  . OTHER SPECIFIED TRIGEMINAL NERVE DISORDERS 07/01/2008  . Allergic rhinitis 07/01/2008  . BRONCHITIS, CHRONIC 07/01/2008  . GERD 07/01/2008  . OSTEOARTHRITIS 07/01/2008  . OCCIPITAL NEURALGIA 07/01/2008  . FIBROMYALGIA 07/01/2008  . CARPAL TUNNEL SYNDROME, HX OF 07/01/2008  . MIGRAINES, HX OF 07/01/2008    Past Surgical History:  Procedure Laterality Date  . CARPAL TUNNEL RELEASE  2009   RIGHT  . HEAD CRANIECTOMY  1994   MICROVASULAR DECOMPRESSION  . LAVH     BSO  . LEFT KNEE SURGERY  1989  . OCCIPITAL NERVE STIMULATOR INSERTION    . RIGHT FOOT BONE SPUR  1987  . RIGHT FOOT SURGERY  1998   BAD CUT  . RIGHT JAW SURGERY  2002  . RIGHT KNEE SURGERY  1990  . [place]  . RIGHT THUMB SUGERY  2004  . TONSILLECTOMY  1957    OB History    Gravida Para Term Preterm AB Living   0             SAB TAB Ectopic Multiple Live Births                   Home Medications    Prior to Admission medications   Medication Sig Start Date End Date Taking? Authorizing Provider  ALPRAZolam Duanne Moron) 0.5 MG tablet Take 1  tablet (0.5 mg total) by mouth 2 (two) times daily. Take one tablet qhs prn. 05/20/17   Burchette, Alinda Sierras, MD  calcium carbonate (OS-CAL - DOSED IN MG OF ELEMENTAL CALCIUM) 1250 MG tablet Take 1 tablet by mouth daily.    [provider]  carboxymethylcellulose (REFRESH PLUS) 0.5 % SOLN Take 1 drop by mouth daily as needed. For dry eyes    [provider]  cetirizine (ZYRTEC) 10 MG tablet Take 10 mg by mouth daily.      [provider]  cycloSPORINE (RESTASIS) 0.05 % ophthalmic emulsion Place 1 drop into both eyes 2 (two) times daily.    [provider]  fluticasone (FLONASE) 50 MCG/ACT nasal spray Place 2 sprays into  both nostrils daily as needed. For nasal congestion 02/16/17 01/24/19  Burchette, Alinda Sierras, MD  frovatriptan (FROVA) 2.5 MG tablet Take 2.5 mg by mouth as needed. If recurs, may repeat after 2 hours. Max of 3 tabs in 24 hours. For migraines.    [provider]  hydrOXYzine (ATARAX) 10 MG tablet Take 10 mg by mouth every 8 (eight) hours as needed. For dizziness per Renaldo Reel, MD    [provider]  lidocaine (LIDODERM) 5 % Place 1 patch onto the skin as needed. Remove & Discard patch within 12 hours or as directed by Renaldo Reel MD     [provider]  Linaclotide Rolan Lipa) 290 MCG CAPS capsule Take 1 capsule (290 mcg total) by mouth daily. Patient taking differently: Take 290 mcg by mouth daily.  10/30/15   Burchette, Alinda Sierras, MD  meclizine (ANTIVERT) 25 MG tablet TAKE 1 TABLET BY MOUTH 4 TIMES A DAY AS NEEDED FOR DIZZINESS 04/17/14   Kathrynn Ducking, MD  meloxicam (MOBIC) 15 MG tablet Take 1 tablet (15 mg total) by mouth daily. 02/16/17   Burchette, Alinda Sierras, MD  metaxalone (SKELAXIN) 800 MG tablet Take 800 mg by mouth 3 (three) times daily.      [provider]  metoclopramide (REGLAN) 10 MG tablet Take 10 mg by mouth 3 (three) times daily as needed. For nausea    [provider]  mometasone (ELOCON) 0.1 % ointment Apply topically daily. 05/20/17   Burchette, Alinda Sierras, MD  montelukast (SINGULAIR) 10 MG tablet TAKE 1 TABLET (10 MG TOTAL) BY MOUTH DAILY AT 6 PM. 02/16/17   Burchette, Alinda Sierras, MD  Multiple Vitamin (MULTIVITAMIN) tablet Take 1 tablet by mouth every morning.     [provider]  Omega-3 Fatty Acids (FISH OIL) 1200 MG CAPS Take 1 capsule by mouth every evening.    [provider]  potassium chloride (K-DUR) 10 MEQ tablet Take two tablets once daily 02/16/17   Burchette, Alinda Sierras, MD  RABEprazole (ACIPHEX) 20 MG tablet Take 1 tablet (20 mg total) by mouth 2 (two) times daily. 02/16/17   Burchette, Alinda Sierras, MD  tiZANidine (ZANAFLEX) 2 MG  tablet Take 2 mg by mouth at bedtime.    [provider]  torsemide (DEMADEX) 10 MG tablet Take 1 tablet (10 mg total) by mouth daily. 02/16/17   Burchette, Alinda Sierras, MD  triamcinolone cream (KENALOG) 0.1 % Apply 1 application topically 2 (two) times daily as needed. 05/20/17   Burchette, Alinda Sierras, MD  UNABLE TO FIND Reported on 09/23/2015 02/22/14   [provider]  vitamin B-12 (CYANOCOBALAMIN) 1000 MCG tablet Take 1 tablet (1,000 mcg total) by mouth as needed. Reported on 10/01/2015 10/30/15   Eulas Post, MD  vitamin E 400 UNIT capsule Take 400 Units by mouth daily at 6 PM.     [provider]    Family History Family History  Problem Relation Age of Onset  . Ovarian cancer Mother 28  . Hypertension Father   . Prostate cancer Father   . Heart attack Father   . Heart attack Maternal Grandmother   . Stroke Paternal Grandmother     Social History Social History   Tobacco Use  . Smoking status: Former Smoker    Packs/day: 2.00    Years: 30.00    Pack years: 60.00    Types: Cigarettes    Last attempt to quit: 02/04/2012    Years since quitting: 5.5  . Smokeless tobacco: Current User  . Tobacco comment: nicotine in vapor and is very low nicotine   Substance Use Topics  . Alcohol use: No    Alcohol/week: 0.0 oz    Comment: quit 2000  . Drug use: No     Allergies   Cefazolin; Carbamazepine; Venlafaxine; Baclofen; Ceclor [cefaclor]; Duloxetine; Fentanyl; Gabapentin; Milnacipran; Oxcarbazepine; Pregabalin; Topiramate; and Zonisamide   Review of Systems Review of Systems   Physical Exam Updated Vital Signs BP (!) 140/58 (BP Location: Right Arm)   Pulse 88   Temp 97.8 F (36.6 C) (Oral)   Resp 18   Ht 5\' 3"  (1.6 m)   Wt 111.1 kg (245 lb)   LMP 07/26/1992   SpO2 96%   BMI 43.40 kg/m   Physical Exam  Constitutional: She appears well-developed and well-nourished. No distress.  HENT:  Head: Normocephalic and atraumatic.  Eyes: Conjunctivae  are normal. Right eye exhibits no discharge. Left eye exhibits no discharge.  Neck: Neck supple.  Cardiovascular: Normal rate, regular rhythm and normal heart sounds. Exam reveals no gallop and no friction rub.  No murmur heard. Pulmonary/Chest: Effort normal and breath sounds normal. No respiratory distress.  Abdominal: Soft. She exhibits no distension. There is no tenderness.  Musculoskeletal: She exhibits no edema or tenderness.  Midline surgical incision and one over the right buttock are intact and appear to be healing appropriately.  There are no concerning skin changes.  Battery device is palpable subcutaneously. Doesn't seem significantly tender.  I cant say I appreciate any collection.   Neurological: She is alert.  Skin: Skin is warm and dry.  Psychiatric: She has a normal mood and affect. Her behavior is normal. Thought content normal.  Nursing note and vitals reviewed.    ED Treatments / Results  Labs (all labs ordered are listed, but only abnormal results are displayed) Labs Reviewed  BASIC METABOLIC PANEL - Abnormal; Notable for the following components:      Result Value   Glucose, Bld 113 (*)    BUN 23 (*)    All other components within normal limits  CBC WITH DIFFERENTIAL/PLATELET    EKG  EKG Interpretation None       Radiology No results found.  Procedures Procedures (including critical care time)  Medications Ordered in ED Medications - No data to display   Initial Impression / Assessment and Plan / ED Course  I have reviewed the triage vital signs and the nursing notes.  Pertinent labs & imaging results that were available during my care of the patient were reviewed by me and considered in my medical decision making (see chart for details).     67 year old female with pain and recent surgical site.  On exam this appears to be healing appropriately.  I not see anything that looks concerning for possible infection.  Discussed with her that beyond  this that she needs to follow-up with her surgeon to discuss further.  Final Clinical Impressions(s) / ED Diagnoses   Final diagnoses:  Post-operative pain    ED Discharge Orders    None       Virgel Manifold, MD 08/19/17 [phone]

## 2017-08-22 DIAGNOSIS — M542 Cervicalgia: Secondary | ICD-10-CM | POA: Diagnosis not present

## 2017-08-22 DIAGNOSIS — M7918 Myalgia, other site: Secondary | ICD-10-CM | POA: Diagnosis not present

## 2017-08-22 DIAGNOSIS — G894 Chronic pain syndrome: Secondary | ICD-10-CM | POA: Diagnosis not present

## 2017-08-22 DIAGNOSIS — M5417 Radiculopathy, lumbosacral region: Secondary | ICD-10-CM | POA: Diagnosis not present

## 2017-08-29 DIAGNOSIS — M533 Sacrococcygeal disorders, not elsewhere classified: Secondary | ICD-10-CM | POA: Diagnosis not present

## 2017-08-29 DIAGNOSIS — M7918 Myalgia, other site: Secondary | ICD-10-CM | POA: Diagnosis not present

## 2017-08-29 DIAGNOSIS — M545 Low back pain: Secondary | ICD-10-CM | POA: Diagnosis not present

## 2017-08-29 DIAGNOSIS — G894 Chronic pain syndrome: Secondary | ICD-10-CM | POA: Diagnosis not present

## 2017-08-31 DIAGNOSIS — M5481 Occipital neuralgia: Secondary | ICD-10-CM | POA: Diagnosis not present

## 2017-08-31 DIAGNOSIS — Z86718 Personal history of other venous thrombosis and embolism: Secondary | ICD-10-CM | POA: Diagnosis not present

## 2017-08-31 DIAGNOSIS — Z885 Allergy status to narcotic agent status: Secondary | ICD-10-CM | POA: Diagnosis not present

## 2017-08-31 DIAGNOSIS — G9059 Complex regional pain syndrome I of other specified site: Secondary | ICD-10-CM | POA: Diagnosis not present

## 2017-08-31 DIAGNOSIS — G709 Myoneural disorder, unspecified: Secondary | ICD-10-CM | POA: Diagnosis not present

## 2017-08-31 DIAGNOSIS — M5115 Intervertebral disc disorders with radiculopathy, thoracolumbar region: Secondary | ICD-10-CM | POA: Diagnosis not present

## 2017-08-31 DIAGNOSIS — M5431 Sciatica, right side: Secondary | ICD-10-CM | POA: Diagnosis not present

## 2017-08-31 DIAGNOSIS — M25551 Pain in right hip: Secondary | ICD-10-CM | POA: Diagnosis not present

## 2017-08-31 DIAGNOSIS — M5114 Intervertebral disc disorders with radiculopathy, thoracic region: Secondary | ICD-10-CM | POA: Diagnosis not present

## 2017-08-31 DIAGNOSIS — G500 Trigeminal neuralgia: Secondary | ICD-10-CM | POA: Diagnosis not present

## 2017-08-31 DIAGNOSIS — Z7982 Long term (current) use of aspirin: Secondary | ICD-10-CM | POA: Diagnosis not present

## 2017-08-31 DIAGNOSIS — M5441 Lumbago with sciatica, right side: Secondary | ICD-10-CM | POA: Diagnosis not present

## 2017-08-31 DIAGNOSIS — M5430 Sciatica, unspecified side: Secondary | ICD-10-CM | POA: Diagnosis not present

## 2017-08-31 DIAGNOSIS — G249 Dystonia, unspecified: Secondary | ICD-10-CM | POA: Diagnosis not present

## 2017-08-31 DIAGNOSIS — M4726 Other spondylosis with radiculopathy, lumbar region: Secondary | ICD-10-CM | POA: Diagnosis not present

## 2017-08-31 DIAGNOSIS — R030 Elevated blood-pressure reading, without diagnosis of hypertension: Secondary | ICD-10-CM | POA: Diagnosis not present

## 2017-08-31 DIAGNOSIS — S79911A Unspecified injury of right hip, initial encounter: Secondary | ICD-10-CM | POA: Diagnosis not present

## 2017-08-31 DIAGNOSIS — Z888 Allergy status to other drugs, medicaments and biological substances status: Secondary | ICD-10-CM | POA: Diagnosis not present

## 2017-08-31 DIAGNOSIS — Z87891 Personal history of nicotine dependence: Secondary | ICD-10-CM | POA: Diagnosis not present

## 2017-08-31 DIAGNOSIS — Z7951 Long term (current) use of inhaled steroids: Secondary | ICD-10-CM | POA: Diagnosis not present

## 2017-08-31 DIAGNOSIS — Z881 Allergy status to other antibiotic agents status: Secondary | ICD-10-CM | POA: Diagnosis not present

## 2017-08-31 DIAGNOSIS — Z79899 Other long term (current) drug therapy: Secondary | ICD-10-CM | POA: Diagnosis not present

## 2017-08-31 DIAGNOSIS — Z9689 Presence of other specified functional implants: Secondary | ICD-10-CM | POA: Diagnosis not present

## 2017-08-31 DIAGNOSIS — Z791 Long term (current) use of non-steroidal anti-inflammatories (NSAID): Secondary | ICD-10-CM | POA: Diagnosis not present

## 2017-09-02 ENCOUNTER — Ambulatory Visit (INDEPENDENT_AMBULATORY_CARE_PROVIDER_SITE_OTHER): Payer: Medicare Other | Admitting: Physician Assistant

## 2017-09-02 ENCOUNTER — Ambulatory Visit: Payer: Self-pay

## 2017-09-02 ENCOUNTER — Ambulatory Visit (HOSPITAL_BASED_OUTPATIENT_CLINIC_OR_DEPARTMENT_OTHER)
Admission: RE | Admit: 2017-09-02 | Discharge: 2017-09-02 | Disposition: A | Discharge status: Home / Self Care | Payer: Medicare Other | Source: Ambulatory Visit | Attending: Physician Assistant | Admitting: Physician Assistant

## 2017-09-02 ENCOUNTER — Encounter: Payer: Self-pay | Admitting: Physician Assistant

## 2017-09-02 ENCOUNTER — Other Ambulatory Visit: Payer: Self-pay

## 2017-09-02 VITALS — BP 130/74 | HR 84 | Temp 98.1°F | Resp 16 | Ht 63.0 in | Wt 251.0 lb

## 2017-09-02 DIAGNOSIS — R0602 Shortness of breath: Secondary | ICD-10-CM | POA: Diagnosis not present

## 2017-09-02 DIAGNOSIS — R002 Palpitations: Secondary | ICD-10-CM

## 2017-09-02 DIAGNOSIS — M7989 Other specified soft tissue disorders: Secondary | ICD-10-CM

## 2017-09-02 DIAGNOSIS — M79662 Pain in left lower leg: Secondary | ICD-10-CM | POA: Diagnosis not present

## 2017-09-02 DIAGNOSIS — M79661 Pain in right lower leg: Secondary | ICD-10-CM | POA: Diagnosis not present

## 2017-09-02 NOTE — Progress Notes (Signed)
Patient presents to clinic today c/o episodes of racing heart with dizziness and SOB since Wednesday. Patient with history of multiple DVT (not on anticoagulation), Fibromyalgia, Chronic back pain, autonomic dysfunction -- followed by multiple specialists. Has a spinal stimulator in place that is pressing on her sciatic nerve. As such is followed closely by [facility] who currently has her on a steroid taper to help with pain which she feels is not working. Notes increase in BP over the past several days along with severe pain for which she went to a [facility] ER. States she had CT scan of hip that was unremarkable for any worrisome finding. States since then she has noted the episodes of palpitations, elevated BP and SOB. Notes dizziness with standing. Palpitations are worse with recumbent positioning. Lasting a minute or so. Denies syncope. Denies headache different from chronic migraines. Was triaged earlier and instructed she needed assessment in the ER but refused. Is wanting to know what is going on. Has noted swelling in her RLE with calf pain. States she has RSD in her RLE so is hard for her to tell when pain is from this or something else.  Past Medical History:  Diagnosis Date  . Adenomatous colon polyp 09/2008  . ALLERGIC RHINITIS 07/01/2008  . ANXIETY 07/01/2008  . BACK PAIN, THORACIC REGION 03/04/2010  . BRONCHITIS, CHRONIC 07/01/2008  . CARPAL TUNNEL SYNDROME, HX OF 07/01/2008  . CHRONIC OBSTRUCTIVE PULMONARY DISEASE, ACUTE EXACERBATION 08/07/2010  . DEPRESSION 07/01/2008  . DVT, HX OF 07/01/2008  . ECZEMA 05/29/2009  . FACIAL PAIN 12/25/2009  . FIBROMYALGIA 07/01/2008  . GERD 07/01/2008  . Hiatal hernia   . Hyperlipidemia   . LEG PAIN, BILATERAL 07/01/2008  . MIGRAINES, HX OF 07/01/2008  . OCCIPITAL NEURALGIA 07/01/2008  . OSTEOARTHRITIS 07/01/2008  . Other specified trigeminal nerve disorders 07/01/2008  . REFLEX SYMPATHETIC DYSTROPHY 07/01/2008  . Trigeminal neuralgia 05/21/2009    . Unspecified vitamin D deficiency 03/31/2010    Current Outpatient Medications on File Prior to Visit  Medication Sig Dispense Refill  . ALPRAZolam (XANAX) 0.5 MG tablet Take 1 tablet (0.5 mg total) by mouth 2 (two) times daily. Take one tablet qhs prn. 180 tablet 1  . calcium carbonate (OS-CAL - DOSED IN MG OF ELEMENTAL CALCIUM) 1250 MG tablet Take 1 tablet by mouth daily.    . carboxymethylcellulose (REFRESH PLUS) 0.5 % SOLN Take 1 drop by mouth daily as needed. For dry eyes    . cetirizine (ZYRTEC) 10 MG tablet Take 10 mg by mouth daily.      . cycloSPORINE (RESTASIS) 0.05 % ophthalmic emulsion Place 1 drop into both eyes 2 (two) times daily.    . fluticasone (FLONASE) 50 MCG/ACT nasal spray Place 2 sprays into both nostrils daily as needed. For nasal congestion 16 g 3  . frovatriptan (FROVA) 2.5 MG tablet Take 2.5 mg by mouth as needed. If recurs, may repeat after 2 hours. Max of 3 tabs in 24 hours. For migraines.    . hydrOXYzine (ATARAX) 10 MG tablet Take 10 mg by mouth every 8 (eight) hours as needed. For dizziness per Renaldo Reel, MD    . lidocaine (LIDODERM) 5 % Place 1 patch onto the skin as needed. Remove & Discard patch within 12 hours or as directed by Renaldo Reel MD     . Linaclotide (LINZESS) 290 MCG CAPS capsule Take 1 capsule (290 mcg total) by mouth daily. (Patient taking differently: Take 290 mcg by mouth daily. )  90 capsule 3  . meclizine (ANTIVERT) 25 MG tablet TAKE 1 TABLET BY MOUTH 4 TIMES A DAY AS NEEDED FOR DIZZINESS 120 tablet 1  . meloxicam (MOBIC) 15 MG tablet Take 1 tablet (15 mg total) by mouth daily. 90 tablet 3  . metaxalone (SKELAXIN) 800 MG tablet Take 800 mg by mouth 3 (three) times daily.      . metoclopramide (REGLAN) 10 MG tablet Take 10 mg by mouth 3 (three) times daily as needed. For nausea    . mometasone (ELOCON) 0.1 % ointment Apply topically daily. 45 g 3  . montelukast (SINGULAIR) 10 MG tablet TAKE 1 TABLET (10 MG TOTAL) BY MOUTH DAILY AT 6 PM. 90  tablet 3  . Multiple Vitamin (MULTIVITAMIN) tablet Take 1 tablet by mouth every morning.     . Omega-3 Fatty Acids (FISH OIL) 1200 MG CAPS Take 1 capsule by mouth every evening.    . potassium chloride (K-DUR) 10 MEQ tablet Take two tablets once daily 90 tablet 3  . predniSONE (DELTASONE) 10 MG tablet TAKE 4 TAB ONCE/DAY X5 DAYS, THEN 3 X5 DAYS, THEN 2 TAB X5 DAYS, THEN 1 TAB X5 DAYS  0  . RABEprazole (ACIPHEX) 20 MG tablet Take 1 tablet (20 mg total) by mouth 2 (two) times daily. 180 tablet 3  . tiZANidine (ZANAFLEX) 2 MG tablet Take 2 mg by mouth at bedtime.    . torsemide (DEMADEX) 10 MG tablet Take 1 tablet (10 mg total) by mouth daily. 90 tablet 3  . triamcinolone cream (KENALOG) 0.1 % Apply 1 application topically 2 (two) times daily as needed. 15 g 3  . UNABLE TO FIND Reported on 09/23/2015    . vitamin B-12 (CYANOCOBALAMIN) 1000 MCG tablet Take 1 tablet (1,000 mcg total) by mouth as needed. Reported on 10/01/2015 90 tablet 0  . vitamin E 400 UNIT capsule Take 400 Units by mouth daily at 6 PM.      Current Facility-Administered Medications on File Prior to Visit  Medication Dose Route Frequency Provider Last Rate Last Dose  . valproate (DEPACON) 1,500 mg in sodium chloride 0.9 % 100 mL IVPB  1,500 mg Intravenous Continuous Kathrynn Ducking, MD 115 mL/hr at 01/02/14 1215 1,500 mg at 01/02/14 1215    Allergies  Allergen Reactions  . Cefazolin     Ancef - in surgery, swelled up, turned blue, heart problems  . Carbamazepine Other (See Comments)    REACTION: confusion  . Venlafaxine Other (See Comments)  . Baclofen     REACTION: confusion  . Ceclor [Cefaclor]     hives  . Duloxetine Other (See Comments)  . Fentanyl     REACTION: nausea and vomiting  . Gabapentin     REACTION: confusion  . Milnacipran Other (See Comments)  . Oxcarbazepine     REACTION: confusion  . Pregabalin Other (See Comments)  . Topiramate Other (See Comments)  . Zonisamide Other (See Comments)    Family  History  Problem Relation Age of Onset  . Ovarian cancer Mother 31  . Hypertension Father   . Prostate cancer Father   . Heart attack Father   . Heart attack Maternal Grandmother   . Stroke Paternal Grandmother     Social History   Socioeconomic History  . Marital status: Married    Spouse name: [name]  . Number of children: 0  . Years of education: college-3  . Highest education level: None  Social Needs  . Financial resource strain: None  .  Food insecurity - worry: None  . Food insecurity - inability: None  . Transportation needs - medical: None  . Transportation needs - non-medical: None  Occupational History  . Occupation: disabled  Tobacco Use  . Smoking status: Former Smoker    Packs/day: 2.00    Years: 30.00    Pack years: 60.00    Types: Cigarettes    Last attempt to quit: 02/04/2012    Years since quitting: 5.5  . Smokeless tobacco: Current User  . Tobacco comment: nicotine in vapor and is very low nicotine   Substance and Sexual Activity  . Alcohol use: No    Alcohol/week: 0.0 oz    Comment: quit 2000  . Drug use: No  . Sexual activity: No  Other Topics Concern  . None  Social History Narrative   Patient lives at home with husband [name].    Patient has no children and 2 step children.    Patient is on disability since 9 for arthalgia.    Patient has 3 years of college.    Patient is right handed.     Review of Systems - See HPI.  All other ROS are negative.  BP 130/74   Pulse 84   Temp 98.1 F (36.7 C) (Oral)   Resp 16   Ht 5' 3"  (1.6 m)   Wt 251 lb (113.9 kg)   LMP 07/26/1992   SpO2 97%   BMI 44.46 kg/m   Physical Exam  Constitutional: She is oriented to person, place, and time and well-developed, well-nourished, and in no distress.  HENT:  Head: Normocephalic and atraumatic.  Eyes: Conjunctivae are normal. Pupils are equal, round, and reactive to light.  Neck: Neck supple.  Cardiovascular: Normal rate, regular rhythm, normal heart  sounds and intact distal pulses.  Pulses:      Dorsalis pedis pulses are 1+ on the right side, and 2+ on the left side.       Posterior tibial pulses are 1+ on the right side, and 2+ on the left side.  RLE edema with + Homan sign.   Pulmonary/Chest: Effort normal and breath sounds normal. No respiratory distress. She has no wheezes. She has no rales. She exhibits no tenderness.  Neurological: She is alert and oriented to person, place, and time.  Skin: Skin is warm and dry.  Vitals reviewed.  Recent Results (from the past 2160 hour(s))  CBC with Differential     Status: None   Collection Time: 08/17/17  3:58 PM  Result Value Ref Range   WBC 9.7 4.0 - 10.5 K/uL   RBC 4.03 3.87 - 5.11 MIL/uL   Hemoglobin 12.1 12.0 - 15.0 g/dL   HCT 37.8 36.0 - 46.0 %   MCV 93.8 78.0 - 100.0 fL   MCH 30.0 26.0 - 34.0 pg   MCHC 32.0 30.0 - 36.0 g/dL   RDW 13.1 11.5 - 15.5 %   Platelets 362 150 - 400 K/uL   Neutrophils Relative % 70 %   Neutro Abs 6.8 1.7 - 7.7 K/uL   Lymphocytes Relative 19 %   Lymphs Abs 1.8 0.7 - 4.0 K/uL   Monocytes Relative 10 %   Monocytes Absolute 1.0 0.1 - 1.0 K/uL   Eosinophils Relative 1 %   Eosinophils Absolute 0.1 0.0 - 0.7 K/uL   Basophils Relative 0 %   Basophils Absolute 0.0 0.0 - 0.1 K/uL  Basic metabolic panel     Status: Abnormal   Collection Time: 08/17/17  3:58  PM  Result Value Ref Range   Sodium 138 135 - 145 mmol/L   Potassium 4.0 3.5 - 5.1 mmol/L   Chloride 102 101 - 111 mmol/L   CO2 25 22 - 32 mmol/L   Glucose, Bld 113 (H) 65 - 99 mg/dL   BUN 23 (H) 6 - 20 mg/dL   Creatinine, Ser 0.87 0.44 - 1.00 mg/dL   Calcium 8.9 8.9 - 10.3 mg/dL   GFR calc non Af Amer >60 >60 mL/min   GFR calc Af Amer >60 >60 mL/min    Comment: (NOTE) The eGFR has been calculated using the CKD EPI equation. This calculation has not been validated in all clinical situations. eGFR's persistently <60 mL/min signify possible Chronic Kidney Disease.    Anion gap 11 5 - 15    Assessment/Plan: 1. Palpitations - EKG 12-Lead - US Venous Img Lower Unilateral Right; Future 2. Calf swelling - US Venous Img Lower Unilateral Right; Future 3. SOB (shortness of breath) - US Venous Img Lower Unilateral Right; Future  Concern for DVT and potential PE giving her symptoms. EKG with sinus rhythm. With ambulation heart rate significantly versus from normal rate to tachycardia to bradycardia. O2 stable but SOB noted. ER assessment recommended via EMS. Patient refuses AMA. Will not have her husband carry her to ER. States she does not want to be exposed to germs in the waiting room. Discussed if arriving by EMS with current symptoms and concerns, she will be taken straight back for emergent assessment. She still refuses. Will allow be to get a STAT US to assess for DVT. She agrees if positive, needs ER assessment. Agrees if anything is worsening promises to get ER assessment. If [place] negative will need assessment with Cardiology. There is not much I can do for her to the weekend and she is refusing to follow my recommendations for her health. Greater than 1 hour spent with patient.    Leeanne Rio, PA-C

## 2017-09-02 NOTE — Patient Instructions (Signed)
Please go directly to [facility] for [place].  They will perform test and call me immediately with results. I have recommended ER due to concern for clot in the chest but you are refusing. If anything worsens, you have to be seen in the ER! This is not a matter to take lightly.

## 2017-09-02 NOTE — Telephone Encounter (Signed)
Pt stated that starting Wednesday she began having dizziness, lightheadedness, weakness and worsened H/A. Pt stated that she has h/o of "chronic headaches but c/o it hurts more than usual." She was seen in ED Wednesday and stated her BP was 170/87. Pt states that is very high for her. She stated that she usually has a systolic BP of "884." Had pt recheck BP and it was 110/80 this am and then rechecked it standing and it went up to 161/76. Due to her neurological sx and elevated BP with standing advised pt to go to [facility]. Pt adamantly refused. Notified Leandra Kern at [facility] and she was unable to work pt in. Appt made for pt with Elyn Aquas PA today at 2:00 for a 30 minute appt. Advised pt that if she worsens to call go to [facility]/ED.  Reason for Disposition . [1] Systolic BP  >= 660 OR Diastolic >= 630 AND [1] cardiac or neurologic symptoms (e.g., chest pain, difficulty breathing, unsteady gait, blurred vision)  Answer Assessment - Initial Assessment Questions 1. BLOOD PRESSURE: "What is the blood pressure?" "Did you take at least two measurements 5 minutes apart?"     110/80 sitting 161/76  2. ONSET: "When did you take your blood pressure?"     1004 3. HOW: "How did you obtain the blood pressure?" (e.g., visiting nurse, automatic home BP monitor)     Automatic BP 4. HISTORY: "Do you have a history of high blood pressure?"     Pt states that systolic runs 601 and now is is elevated  5. MEDICATIONS: "Are you taking any medications for blood pressure?" "Have you missed any doses recently?"     no 6. OTHER SYMPTOMS: "Do you have any symptoms?" (e.g., headache, chest pain, blurred vision, difficulty breathing, weakness)     Headache, SOB, weakness, dizzy, lightheaded, fatigued, and shaky 7. PREGNANCY: "Is there any chance you are pregnant?" "When was your last menstrual period?"     n/a  Protocols used: HIGH BLOOD PRESSURE-A-AH

## 2017-09-02 NOTE — Telephone Encounter (Signed)
Spoke with patient at the request of Elyn Aquas to make sure that patient is able to come to the office today. Patient states that she had a pain stimulator placed in her back in December and the battery pack is sitting on her sciatic nerve and is causing a great deal of pain. BP is currently 155/84, she is slightly SOB.   She is on steroids (40mg ) every morning since the end of January.  She states that she just wanted to be checked out before the weekend.  She really felt like it was coming more from the stress, pain and steroids.   She was seen in the ED at [facility] on 2/6 for the stimulator pain.  They did note the SOB, but told her to follow-up with PCP.   Husband is driving patient to appointment this afternoon, and she is aware that if any symptoms worsen, or if she develops any new symptoms, she is to go to the ED immediately.   She stated verbal understanding.    This information was given to Elyn Aquas, PA-C as well since he will be seeing her this afternoon.

## 2017-09-03 ENCOUNTER — Emergency Department (HOSPITAL_COMMUNITY): Payer: Medicare Other

## 2017-09-03 ENCOUNTER — Encounter (HOSPITAL_COMMUNITY): Payer: Self-pay | Admitting: Nurse Practitioner

## 2017-09-03 ENCOUNTER — Emergency Department (HOSPITAL_COMMUNITY)
Admission: EM | Admit: 2017-09-03 | Discharge: 2017-09-04 | Discharge status: Against Medical Advice | Payer: Medicare Other | Attending: Emergency Medicine | Admitting: Emergency Medicine

## 2017-09-03 DIAGNOSIS — R0602 Shortness of breath: Secondary | ICD-10-CM | POA: Insufficient documentation

## 2017-09-03 DIAGNOSIS — R030 Elevated blood-pressure reading, without diagnosis of hypertension: Secondary | ICD-10-CM | POA: Diagnosis not present

## 2017-09-03 DIAGNOSIS — R5381 Other malaise: Secondary | ICD-10-CM | POA: Insufficient documentation

## 2017-09-03 DIAGNOSIS — Z5321 Procedure and treatment not carried out due to patient leaving prior to being seen by health care provider: Secondary | ICD-10-CM | POA: Insufficient documentation

## 2017-09-03 DIAGNOSIS — M25559 Pain in unspecified hip: Secondary | ICD-10-CM | POA: Diagnosis not present

## 2017-09-03 NOTE — ED Notes (Signed)
Patient called for blood draw no answer

## 2017-09-03 NOTE — ED Triage Notes (Signed)
Pt is c/o shortness of breath and states her PCP advised her to come in the ER for evaluation of her other multiple complaints including elevated BP and not feeling well.

## 2017-09-03 NOTE — ED Notes (Signed)
Pt stated she was not staying if she had to wait in the lobby.

## 2017-09-05 ENCOUNTER — Telehealth: Payer: Self-pay | Admitting: Family Medicine

## 2017-09-05 ENCOUNTER — Other Ambulatory Visit: Payer: Self-pay | Admitting: Physician Assistant

## 2017-09-05 DIAGNOSIS — M542 Cervicalgia: Secondary | ICD-10-CM | POA: Diagnosis not present

## 2017-09-05 DIAGNOSIS — M5417 Radiculopathy, lumbosacral region: Secondary | ICD-10-CM | POA: Diagnosis not present

## 2017-09-05 DIAGNOSIS — G894 Chronic pain syndrome: Secondary | ICD-10-CM | POA: Diagnosis not present

## 2017-09-05 DIAGNOSIS — R0602 Shortness of breath: Secondary | ICD-10-CM

## 2017-09-05 DIAGNOSIS — R002 Palpitations: Secondary | ICD-10-CM

## 2017-09-05 DIAGNOSIS — M7918 Myalgia, other site: Secondary | ICD-10-CM | POA: Diagnosis not present

## 2017-09-05 NOTE — Telephone Encounter (Signed)
FYI Placed urgent referral to Cardiology for patient.

## 2017-09-05 NOTE — Telephone Encounter (Signed)
Copied from [place]. Topic: General - Other >> Sep 05, 2017  9:08 AM Scherrie Gerlach wrote: Reason for CRM: pt wants Ashley Castaneda to know her situation has not changed since she saw him on Friday. Pt states she went by ambulance on Sat to the ED.  Pt states she could not sit in the hard chairs there, and left without being seen. Pt 's pcp is Dr Elease Hashimoto, but Ashley Castaneda was nice enough to see her on Friday, and he really listened to her. Pt states Ashley Castaneda had mentioned getting her referral to a specialist, cardiologist.  Pt would like to proceed with this referral asap  Pt has an appointment in [place] this morning, so would like a call back on her cell phone. Pt states mentioned Ashley Castaneda getting her appointment today. Hopes to hear back something as soon as possible

## 2017-09-07 ENCOUNTER — Encounter: Payer: Self-pay | Admitting: Interventional Cardiology

## 2017-09-07 ENCOUNTER — Ambulatory Visit (INDEPENDENT_AMBULATORY_CARE_PROVIDER_SITE_OTHER): Payer: Medicare Other | Admitting: Interventional Cardiology

## 2017-09-07 VITALS — BP 124/66 | HR 63 | Ht 63.0 in | Wt 252.1 lb

## 2017-09-07 DIAGNOSIS — Z01810 Encounter for preprocedural cardiovascular examination: Secondary | ICD-10-CM | POA: Diagnosis not present

## 2017-09-07 DIAGNOSIS — R002 Palpitations: Secondary | ICD-10-CM | POA: Diagnosis not present

## 2017-09-07 DIAGNOSIS — R030 Elevated blood-pressure reading, without diagnosis of hypertension: Secondary | ICD-10-CM

## 2017-09-07 NOTE — Progress Notes (Signed)
Cardiology Office Note   Date:  09/07/2017   ID:  Ashley Castaneda, DOB 11-27-1950, MRN [id number]  PCP:  Eulas Post, MD    No chief complaint on file.  palpitations  Wt Readings from Last 3 Encounters:  09/07/17 252 lb 1.9 oz (114.4 kg)  09/02/17 251 lb (113.9 kg)  08/17/17 245 lb (111.1 kg)       History of Present Illness: Ashley Castaneda is a 67 y.o. female who is being seen today for the evaluation of palpitations at the request of Eulas Post, MD.  At her PMD, it was reported: "episodes of racing heart with dizziness and SOB since Wednesday. Patient with history of multiple DVT (not on anticoagulation), Fibromyalgia, Chronic back pain, autonomic dysfunction -- followed by multiple specialists."  Stimulator placed in 12/18.  SHe has persistent pain.  She was placed on steroids with some relief.  Dose was increased.   When she went to the ER, she had a high reading.  Typically, it was well controlled. She had some SHOB.  Given h/o DVT, LE u/s was done which was negative per her report.   She feels that the palpitations are continuous. They have been present for days, all through the weekend.  HR 77 on 09/04/17.  SHe did not wait to be seen.  She has had chronic pain.  Denies : Chest pain. Nitroglycerin use. Orthopnea. Palpitations. Paroxysmal nocturnal dyspnea.  Syncope.     Past Medical History:  Diagnosis Date  . Adenomatous colon polyp 09/2008  . ALLERGIC RHINITIS 07/01/2008  . ANXIETY 07/01/2008  . BACK PAIN, THORACIC REGION 03/04/2010  . BRONCHITIS, CHRONIC 07/01/2008  . CARPAL TUNNEL SYNDROME, HX OF 07/01/2008  . CHRONIC OBSTRUCTIVE PULMONARY DISEASE, ACUTE EXACERBATION 08/07/2010  . DEPRESSION 07/01/2008  . DVT, HX OF 07/01/2008  . ECZEMA 05/29/2009  . FACIAL PAIN 12/25/2009  . FIBROMYALGIA 07/01/2008  . GERD 07/01/2008  . Hiatal hernia   . Hyperlipidemia   . LEG PAIN, BILATERAL 07/01/2008  . MIGRAINES, HX OF 07/01/2008  . OCCIPITAL NEURALGIA 07/01/2008    . OSTEOARTHRITIS 07/01/2008  . Other specified trigeminal nerve disorders 07/01/2008  . REFLEX SYMPATHETIC DYSTROPHY 07/01/2008  . Trigeminal neuralgia 05/21/2009  . Unspecified vitamin D deficiency 03/31/2010    Past Surgical History:  Procedure Laterality Date  . CARPAL TUNNEL RELEASE  2009   RIGHT  . HEAD CRANIECTOMY  1994   MICROVASULAR DECOMPRESSION  . LAVH     BSO  . LEFT KNEE SURGERY  1989  . OCCIPITAL NERVE STIMULATOR INSERTION    . RIGHT FOOT BONE SPUR  1987  . RIGHT FOOT SURGERY  1998   BAD CUT  . RIGHT JAW SURGERY  2002  . RIGHT KNEE SURGERY  1990  . [place]  . RIGHT THUMB SUGERY  2004  . TONSILLECTOMY  1957     Current Outpatient Medications  Medication Sig Dispense Refill  . ALPRAZolam (XANAX) 0.5 MG tablet Take 1 tablet (0.5 mg total) by mouth 2 (two) times daily. Take one tablet qhs prn. 180 tablet 1  . calcium carbonate (OS-CAL - DOSED IN MG OF ELEMENTAL CALCIUM) 1250 MG tablet Take 1 tablet by mouth daily.    . carboxymethylcellulose (REFRESH PLUS) 0.5 % SOLN Take 1 drop by mouth daily as needed. For dry eyes    . cetirizine (ZYRTEC) 10 MG tablet Take 10 mg by mouth daily.      . cycloSPORINE (  RESTASIS) 0.05 % ophthalmic emulsion Place 1 drop into both eyes 2 (two) times daily.    . fluticasone (FLONASE) 50 MCG/ACT nasal spray Place 2 sprays into both nostrils daily as needed. For nasal congestion 16 g 3  . frovatriptan (FROVA) 2.5 MG tablet Take 2.5 mg by mouth as needed. If recurs, may repeat after 2 hours. Max of 3 tabs in 24 hours. For migraines.    . hydrOXYzine (ATARAX) 10 MG tablet Take 10 mg by mouth every 8 (eight) hours as needed. For dizziness per Renaldo Reel, MD    . lidocaine (LIDODERM) 5 % Place 1 patch onto the skin as needed. Remove & Discard patch within 12 hours or as directed by Renaldo Reel MD     . linaclotide (LINZESS) 290 MCG CAPS capsule Take 290 mcg by mouth as needed (constipation).    . meclizine  (ANTIVERT) 25 MG tablet TAKE 1 TABLET BY MOUTH 4 TIMES A DAY AS NEEDED FOR DIZZINESS 120 tablet 1  . meloxicam (MOBIC) 15 MG tablet Take 1 tablet (15 mg total) by mouth daily. 90 tablet 3  . metaxalone (SKELAXIN) 800 MG tablet Take 800 mg by mouth 3 (three) times daily.      . metoclopramide (REGLAN) 10 MG tablet Take 10 mg by mouth 3 (three) times daily as needed. For nausea    . montelukast (SINGULAIR) 10 MG tablet TAKE 1 TABLET (10 MG TOTAL) BY MOUTH DAILY AT 6 PM. 90 tablet 3  . Multiple Vitamin (MULTIVITAMIN) tablet Take 1 tablet by mouth every morning.     . Omega-3 Fatty Acids (FISH OIL) 1200 MG CAPS Take 1 capsule by mouth every evening.    . potassium chloride (K-DUR) 10 MEQ tablet Take two tablets once daily 90 tablet 3  . RABEprazole (ACIPHEX) 20 MG tablet Take 1 tablet (20 mg total) by mouth 2 (two) times daily. 180 tablet 3  . tiZANidine (ZANAFLEX) 2 MG tablet Take 2 mg by mouth at bedtime.    . torsemide (DEMADEX) 10 MG tablet Take 1 tablet (10 mg total) by mouth daily. 90 tablet 3  . UNABLE TO FIND Reported on 09/23/2015    . vitamin E 400 UNIT capsule Take 400 Units by mouth daily at 6 PM.      Current Facility-Administered Medications  Medication Dose Route Frequency Provider Last Rate Last Dose  . valproate (DEPACON) 1,500 mg in sodium chloride 0.9 % 100 mL IVPB  1,500 mg Intravenous Continuous Kathrynn Ducking, MD 115 mL/hr at 01/02/14 1215 1,500 mg at 01/02/14 1215    Allergies:   Cefazolin; Carbamazepine; Venlafaxine; Baclofen; Ceclor [cefaclor]; Duloxetine; Fentanyl; Gabapentin; Milnacipran; Oxcarbazepine; Pregabalin; Topiramate; and Zonisamide    Social History:  The patient  reports that she quit smoking about 5 years ago. Her smoking use included cigarettes. She has a 60.00 pack-year smoking history. She uses smokeless tobacco. She reports that she does not drink alcohol or use drugs.   Family History:  The patient's family history includes Heart attack in her father  and maternal grandmother; Hypertension in her father; Ovarian cancer (age of onset: 55) in her mother; Prostate cancer in her father; Stroke in her paternal grandmother.    ROS:  Please see the history of present illness.   Otherwise, review of systems are positive for palpitations.   All other systems are reviewed and negative.    PHYSICAL EXAM: VS:  BP 124/66   Pulse 63   Ht 5\' 3"  (1.6 m)  Wt 252 lb 1.9 oz (114.4 kg)   LMP 07/26/1992   SpO2 93%   BMI 44.66 kg/m  , BMI Body mass index is 44.66 kg/m. GEN: Well nourished, well developed, in no acute distress  HEENT: normal  Neck: no JVD, carotid bruits, or masses Cardiac: RRR; no murmurs, rubs, or gallops,no edema  Respiratory:  clear to auscultation bilaterally, normal work of breathing GI: soft, nontender, nondistended, + BS MS: no deformity or atrophy  Skin: warm and dry, no rash Neuro:  Strength and sensation are intact Psych: euthymic mood, full affect   EKG:   The ekg ordered 09/04/17 demonstrates NSR, no ST changes   Recent Labs: 10/18/2016: TSH 2.33 08/17/2017: BUN 23; Creatinine, Ser 0.87; Hemoglobin 12.1; Platelets 362; Potassium 4.0; Sodium 138   Lipid Panel    Component Value Date/Time   CHOL 221 (H) 06/14/2013 1107   TRIG 156.0 (H) 06/14/2013 1107   HDL 55.00 06/14/2013 1107   CHOLHDL 4 06/14/2013 1107   VLDL 31.2 06/14/2013 1107   LDLDIRECT 149.8 06/14/2013 1107     Other studies Reviewed: Additional studies/ records that were reviewed today with results demonstrating: ER records/ECG reviewed.   ASSESSMENT AND PLAN:  1. Palpitations: She feels palpitations.  Plan 24 Holter monitor.  I doubt there will be any serious arrhythmia.  We will call with the Holter monitor result.  2. BP: She reports some increased readings.  Currently controlled. COntinue to check at home. Steroids may have increased her BP transiently.  3. Preoperative cardiovascular eval:  She is going to have an operation to move the  battery.  No further cardiac testing needed before this operation.  SHe thinks it will be a local anesthetic.    Current medicines are reviewed at length with the patient today.  The patient concerns regarding her medicines were addressed.  The following changes have been made:  No change  Labs/ tests ordered today include:  No orders of the defined types were placed in this encounter.   Recommend 150 minutes/week of aerobic exercise Low fat, low carb, high fiber diet recommended  Disposition:   FU a sneeded   Signed, Larae Grooms, MD  09/07/2017 3:59 PM    [facility] [place], [place], [place]  [address] Phone: [phone]; Fax: [phone]

## 2017-09-07 NOTE — Patient Instructions (Signed)
Medication Instructions:  Your physician recommends that you continue on your current medications as directed. Please refer to the Current Medication list given to you today.   Labwork: None ordered  Testing/Procedures: Your physician has recommended that you wear a holter monitor. Holter monitors are medical devices that record the heart's electrical activity. Doctors most often use these monitors to diagnose arrhythmias. Arrhythmias are problems with the speed or rhythm of the heartbeat. The monitor is a small, portable device. You can wear one while you do your normal daily activities. This is usually used to diagnose what is causing palpitations/syncope (passing out).  Follow-Up: Your physician wants you to follow-up AS NEEDED  Any Other Special Instructions Will Be Listed Below (If Applicable).     If you need a refill on your cardiac medications before your next appointment, please call your pharmacy.

## 2017-09-08 ENCOUNTER — Ambulatory Visit (INDEPENDENT_AMBULATORY_CARE_PROVIDER_SITE_OTHER): Payer: Medicare Other

## 2017-09-08 DIAGNOSIS — R002 Palpitations: Secondary | ICD-10-CM | POA: Diagnosis not present

## 2017-09-14 DIAGNOSIS — J42 Unspecified chronic bronchitis: Secondary | ICD-10-CM | POA: Diagnosis not present

## 2017-09-14 DIAGNOSIS — T85734D Infection and inflammatory reaction due to implanted electronic neurostimulator, generator, subsequent encounter: Secondary | ICD-10-CM | POA: Diagnosis not present

## 2017-09-14 DIAGNOSIS — E785 Hyperlipidemia, unspecified: Secondary | ICD-10-CM | POA: Diagnosis not present

## 2017-09-14 DIAGNOSIS — T85840A Pain due to nervous system prosthetic devices, implants and grafts, initial encounter: Secondary | ICD-10-CM | POA: Diagnosis not present

## 2017-09-14 DIAGNOSIS — T85733D Infection and inflammatory reaction due to implanted electronic neurostimulator of spinal cord, electrode (lead), subsequent encounter: Secondary | ICD-10-CM | POA: Diagnosis not present

## 2017-09-14 DIAGNOSIS — M5416 Radiculopathy, lumbar region: Secondary | ICD-10-CM | POA: Diagnosis not present

## 2017-09-14 DIAGNOSIS — Z6841 Body Mass Index (BMI) 40.0 and over, adult: Secondary | ICD-10-CM | POA: Diagnosis not present

## 2017-09-14 DIAGNOSIS — K219 Gastro-esophageal reflux disease without esophagitis: Secondary | ICD-10-CM | POA: Diagnosis not present

## 2017-09-14 DIAGNOSIS — Z87891 Personal history of nicotine dependence: Secondary | ICD-10-CM | POA: Diagnosis not present

## 2017-09-14 DIAGNOSIS — T8149XA Infection following a procedure, other surgical site, initial encounter: Secondary | ICD-10-CM | POA: Diagnosis not present

## 2017-09-14 DIAGNOSIS — Z86718 Personal history of other venous thrombosis and embolism: Secondary | ICD-10-CM | POA: Diagnosis not present

## 2017-09-14 DIAGNOSIS — T85190A Other mechanical complication of implanted electronic neurostimulator (electrode) of brain, initial encounter: Secondary | ICD-10-CM | POA: Diagnosis not present

## 2017-09-14 DIAGNOSIS — Z881 Allergy status to other antibiotic agents status: Secondary | ICD-10-CM | POA: Diagnosis not present

## 2017-09-14 DIAGNOSIS — Z9889 Other specified postprocedural states: Secondary | ICD-10-CM | POA: Diagnosis not present

## 2017-09-14 DIAGNOSIS — G894 Chronic pain syndrome: Secondary | ICD-10-CM | POA: Diagnosis not present

## 2017-09-14 DIAGNOSIS — T85733A Infection and inflammatory reaction due to implanted electronic neurostimulator of spinal cord, electrode (lead), initial encounter: Secondary | ICD-10-CM | POA: Diagnosis not present

## 2017-09-14 DIAGNOSIS — Z9689 Presence of other specified functional implants: Secondary | ICD-10-CM | POA: Insufficient documentation

## 2017-09-14 DIAGNOSIS — Z888 Allergy status to other drugs, medicaments and biological substances status: Secondary | ICD-10-CM | POA: Diagnosis not present

## 2017-09-26 ENCOUNTER — Ambulatory Visit: Payer: Medicare Other | Admitting: Family Medicine

## 2017-09-26 ENCOUNTER — Ambulatory Visit: Payer: Medicare Other | Admitting: Interventional Cardiology

## 2017-09-26 DIAGNOSIS — T85733D Infection and inflammatory reaction due to implanted electronic neurostimulator of spinal cord, electrode (lead), subsequent encounter: Secondary | ICD-10-CM | POA: Diagnosis not present

## 2017-09-26 DIAGNOSIS — M542 Cervicalgia: Secondary | ICD-10-CM | POA: Diagnosis not present

## 2017-09-26 DIAGNOSIS — M7918 Myalgia, other site: Secondary | ICD-10-CM | POA: Diagnosis not present

## 2017-09-26 DIAGNOSIS — M5417 Radiculopathy, lumbosacral region: Secondary | ICD-10-CM | POA: Diagnosis not present

## 2017-09-28 ENCOUNTER — Ambulatory Visit (INDEPENDENT_AMBULATORY_CARE_PROVIDER_SITE_OTHER): Payer: Medicare Other | Admitting: Family Medicine

## 2017-09-28 ENCOUNTER — Encounter: Payer: Self-pay | Admitting: Family Medicine

## 2017-09-28 VITALS — BP 110/70 | HR 93 | Temp 98.2°F | Wt 254.9 lb

## 2017-09-28 DIAGNOSIS — R600 Localized edema: Secondary | ICD-10-CM | POA: Insufficient documentation

## 2017-09-28 DIAGNOSIS — M7918 Myalgia, other site: Secondary | ICD-10-CM

## 2017-09-28 DIAGNOSIS — Z79899 Other long term (current) drug therapy: Secondary | ICD-10-CM

## 2017-09-28 DIAGNOSIS — Z5181 Encounter for therapeutic drug level monitoring: Secondary | ICD-10-CM | POA: Diagnosis not present

## 2017-09-28 LAB — CBC WITH DIFFERENTIAL/PLATELET
BASOS ABS: 0.1 10*3/uL (ref 0.0–0.1)
Basophils Relative: 1.6 % (ref 0.0–3.0)
EOS PCT: 3.1 % (ref 0.0–5.0)
Eosinophils Absolute: 0.2 10*3/uL (ref 0.0–0.7)
HEMATOCRIT: 38 % (ref 36.0–46.0)
Hemoglobin: 12.9 g/dL (ref 12.0–15.0)
LYMPHS PCT: 25.1 % (ref 12.0–46.0)
Lymphs Abs: 1.8 10*3/uL (ref 0.7–4.0)
MCHC: 34.1 g/dL (ref 30.0–36.0)
MCV: 90.8 fl (ref 78.0–100.0)
MONOS PCT: 10.7 % (ref 3.0–12.0)
Monocytes Absolute: 0.8 10*3/uL (ref 0.1–1.0)
Neutro Abs: 4.4 10*3/uL (ref 1.4–7.7)
Neutrophils Relative %: 59.5 % (ref 43.0–77.0)
Platelets: 394 10*3/uL (ref 150.0–400.0)
RBC: 4.18 Mil/uL (ref 3.87–5.11)
RDW: 13.3 % (ref 11.5–15.5)
WBC: 7.4 10*3/uL (ref 4.0–10.5)

## 2017-09-28 LAB — BASIC METABOLIC PANEL
BUN: 21 mg/dL (ref 6–23)
CALCIUM: 9.6 mg/dL (ref 8.4–10.5)
CHLORIDE: 103 meq/L (ref 96–112)
CO2: 26 meq/L (ref 19–32)
CREATININE: 0.98 mg/dL (ref 0.40–1.20)
GFR: 60.4 mL/min (ref >60.00)
GLUCOSE: 102 mg/dL — AB (ref 70–99)
Potassium: 4.2 mEq/L (ref 3.5–5.1)
Sodium: 138 mEq/L (ref 135–145)

## 2017-09-28 MED ORDER — ALPRAZOLAM 0.5 MG PO TABS: 0.5000 mg | ORAL_TABLET | Freq: Two times a day (BID) | ORAL | 1 refills | Status: DC | PRN

## 2017-09-28 MED ORDER — TRIAMCINOLONE ACETONIDE 0.1 % MT PSTE: 1.0000 "application " | PASTE | Freq: Two times a day (BID) | OROMUCOSAL | 12 refills | Status: DC

## 2017-09-28 MED ORDER — MOMETASONE FUROATE 0.1 % EX CREA: 1.0000 "application " | TOPICAL_CREAM | Freq: Every day | CUTANEOUS | 3 refills | Status: DC

## 2017-09-28 NOTE — Progress Notes (Signed)
Subjective:     Patient ID: Ashley Castaneda, female   DOB: 1951-06-29, 67 y.o.   MRN: [id number]  HPI Patient has complicated past medical history with history of migraine headaches, remote history of DVT, obesity, GERD, trigeminal neuralgia, osteoarthritis, fibromyalgia, chronic lower extremity edema, hyperlipidemia, history of depression, chronic back pain. She has been followed by multiple specialists including neurology and pain management. She seen today with complaints of right buttock pain which has persisted since surgery back in December for spinal cord stimulator. She had leads placed in her lower lumbar area and battery in her right buttocks. She was placed on clindamycin initially after the surgery.  Shortly after the surgery she started having some pain in the region of her battery. She went to emergency department at one point was placed on some prednisone. She continued have pain despite couple courses of clindamycin. She ended up having surgery [facility] to remove the leads as well as the battery pack. Wound culture was negative. Patient also had reported ultrasound of the buttock prior to that surgery and CT right hip which showed no acute findings. She was told she may have a seroma or hematoma of the buttock. Last white count was 12.5 thousand and 08/31/17.  She's not had any documented fever but does feel feverish intermittently. She continues have buttock pain and was seen by surgeon most recently on 3/4 and was apparently placed then on another course of clindamycin. She's not had any diarrhea. She's not noted any redness.  Past Medical History:  Diagnosis Date  . Adenomatous colon polyp 09/2008  . ALLERGIC RHINITIS 07/01/2008  . ANXIETY 07/01/2008  . BACK PAIN, THORACIC REGION 03/04/2010  . BRONCHITIS, CHRONIC 07/01/2008  . CARPAL TUNNEL SYNDROME, HX OF 07/01/2008  . CHRONIC OBSTRUCTIVE PULMONARY DISEASE, ACUTE EXACERBATION 08/07/2010  . DEPRESSION 07/01/2008  . DVT, HX OF 07/01/2008   . ECZEMA 05/29/2009  . FACIAL PAIN 12/25/2009  . FIBROMYALGIA 07/01/2008  . GERD 07/01/2008  . Hiatal hernia   . Hyperlipidemia   . LEG PAIN, BILATERAL 07/01/2008  . MIGRAINES, HX OF 07/01/2008  . OCCIPITAL NEURALGIA 07/01/2008  . OSTEOARTHRITIS 07/01/2008  . Other specified trigeminal nerve disorders 07/01/2008  . REFLEX SYMPATHETIC DYSTROPHY 07/01/2008  . Trigeminal neuralgia 05/21/2009  . Unspecified vitamin D deficiency 03/31/2010   Past Surgical History:  Procedure Laterality Date  . CARPAL TUNNEL RELEASE  2009   RIGHT  . HEAD CRANIECTOMY  1994   MICROVASULAR DECOMPRESSION  . LAVH     BSO  . LEFT KNEE SURGERY  1989  . OCCIPITAL NERVE STIMULATOR INSERTION    . RIGHT FOOT BONE SPUR  1987  . RIGHT FOOT SURGERY  1998   BAD CUT  . RIGHT JAW SURGERY  2002  . RIGHT KNEE SURGERY  1990  . [place]  . RIGHT THUMB SUGERY  2004  . TONSILLECTOMY  1957    reports that she quit smoking about 5 years ago. Her smoking use included cigarettes. She has a 60.00 pack-year smoking history. She uses smokeless tobacco. She reports that she does not drink alcohol or use drugs. family history includes Heart attack in her father and maternal grandmother; Hypertension in her father; Ovarian cancer (age of onset: 27) in her mother; Prostate cancer in her father; Stroke in her paternal grandmother. Allergies  Allergen Reactions  . Cefazolin     Ancef - in surgery, swelled up, turned blue, heart problems  . Carbamazepine Other (See  Comments)    REACTION: confusion  . Venlafaxine Other (See Comments)  . Baclofen     REACTION: confusion  . Ceclor [Cefaclor]     hives  . Duloxetine Other (See Comments)  . Fentanyl     REACTION: nausea and vomiting  . Gabapentin     REACTION: confusion  . Milnacipran Other (See Comments)  . Oxcarbazepine     REACTION: confusion  . Pregabalin Other (See Comments)  . Topiramate Other (See Comments)  . Zonisamide Other (See Comments)      Review of Systems  Constitutional: Positive for fatigue. Negative for chills and fever.  Cardiovascular: Negative for chest pain.  Gastrointestinal: Negative for abdominal pain, nausea and vomiting.  Musculoskeletal: Positive for back pain.  Skin: Negative for rash.  Hematological: Negative for adenopathy.       Objective:   Physical Exam  Constitutional: She appears well-developed and well-nourished.  Cardiovascular: Normal rate and regular rhythm.  Pulmonary/Chest: Effort normal and breath sounds normal. No respiratory distress. She has no wheezes. She has no rales.  Skin:  Right buttock reveals healed incision inferiorly and superior to this she has horizontal incision which is not fully closed over. There is no erythema. No warmth. Minimal tenderness. No fluctuance.       Assessment:     Persistent right buttock pain following spinal cord stimulator and battery placement and has persisted even after removal of battery and stimulator. She's been on multiple courses of clindamycin as above.  No overlying warmth, erythema, or fluctuance.  Bilateral leg edema on diuretic and stable.    Plan:     -She has been advised multiple times to follow closely with surgeon but she feels they've not been responsive and she is looking to get another opinion surgically -Recheck CBC with differential -repeat BMP. -Consider further imaging of right buttock if pain persists  Eulas Post MD [facility] at [facility]

## 2017-09-30 ENCOUNTER — Encounter: Payer: Self-pay | Admitting: Family Medicine

## 2017-10-03 ENCOUNTER — Other Ambulatory Visit: Payer: Self-pay | Admitting: Family Medicine

## 2017-10-03 NOTE — Addendum Note (Signed)
Addended by: Eulas Post on: 10/03/2017 08:37 AM   Modules accepted: Orders

## 2017-10-05 ENCOUNTER — Other Ambulatory Visit: Payer: Self-pay | Admitting: Family Medicine

## 2017-10-05 ENCOUNTER — Other Ambulatory Visit (INDEPENDENT_AMBULATORY_CARE_PROVIDER_SITE_OTHER): Payer: Medicare Other

## 2017-10-05 DIAGNOSIS — T85733A Infection and inflammatory reaction due to implanted electronic neurostimulator of spinal cord, electrode (lead), initial encounter: Secondary | ICD-10-CM

## 2017-10-05 DIAGNOSIS — M5417 Radiculopathy, lumbosacral region: Secondary | ICD-10-CM | POA: Diagnosis not present

## 2017-10-05 DIAGNOSIS — T85733D Infection and inflammatory reaction due to implanted electronic neurostimulator of spinal cord, electrode (lead), subsequent encounter: Secondary | ICD-10-CM | POA: Diagnosis not present

## 2017-10-05 DIAGNOSIS — M545 Low back pain: Secondary | ICD-10-CM | POA: Diagnosis not present

## 2017-10-05 DIAGNOSIS — G894 Chronic pain syndrome: Secondary | ICD-10-CM | POA: Diagnosis not present

## 2017-10-06 ENCOUNTER — Ambulatory Visit
Admission: RE | Admit: 2017-10-06 | Discharge: 2017-10-06 | Disposition: A | Discharge status: Home / Self Care | Payer: Medicare Other | Source: Ambulatory Visit | Attending: Family Medicine | Admitting: Family Medicine

## 2017-10-06 DIAGNOSIS — R52 Pain, unspecified: Secondary | ICD-10-CM | POA: Diagnosis not present

## 2017-10-06 DIAGNOSIS — M7918 Myalgia, other site: Secondary | ICD-10-CM

## 2017-10-06 DIAGNOSIS — M25551 Pain in right hip: Secondary | ICD-10-CM | POA: Diagnosis not present

## 2017-10-06 LAB — CBC WITH DIFFERENTIAL/PLATELET
BASOS PCT: 1.5 % (ref 0.0–3.0)
Basophils Absolute: 0.1 10*3/uL (ref 0.0–0.1)
EOS ABS: 0.2 10*3/uL (ref 0.0–0.7)
Eosinophils Relative: 2.8 % (ref 0.0–5.0)
HEMATOCRIT: 37.2 % (ref 36.0–46.0)
Hemoglobin: 12.5 g/dL (ref 12.0–15.0)
LYMPHS PCT: 23 % (ref 12.0–46.0)
Lymphs Abs: 1.7 10*3/uL (ref 0.7–4.0)
MCHC: 33.6 g/dL (ref 30.0–36.0)
MCV: 92.4 fl (ref 78.0–100.0)
MONO ABS: 0.8 10*3/uL (ref 0.1–1.0)
Monocytes Relative: 10.8 % (ref 3.0–12.0)
Neutro Abs: 4.5 10*3/uL (ref 1.4–7.7)
Neutrophils Relative %: 61.9 % (ref 43.0–77.0)
PLATELETS: 350 10*3/uL (ref 150.0–400.0)
RBC: 4.03 Mil/uL (ref 3.87–5.11)
RDW: 13.7 % (ref 11.5–15.5)
WBC: 7.3 10*3/uL (ref 4.0–10.5)

## 2017-10-06 LAB — C-REACTIVE PROTEIN: CRP: 1.2 mg/dL (ref 0.5–20.0)

## 2017-10-07 ENCOUNTER — Telehealth: Payer: Self-pay | Admitting: Family Medicine

## 2017-10-07 NOTE — Telephone Encounter (Signed)
Copied from [place]. Topic: Quick Communication - See Telephone Encounter >> Oct 07, 2017  3:12 PM Margot Ables wrote: CRM for notification. See Telephone encounter for: 10/07/17.  Pt is asking what to do for the "boil". Advised pt of notes released to mychart. Pt states that she doesn't know how to proceed. Please advise.

## 2017-10-07 NOTE — Telephone Encounter (Signed)
Spoke with patient and she is aware of results.  [place] fax number is [phone] Dr Catheryn Bacon.  She would  like just her labs results sent. She would like to know what she should do until her appointment in 2 weeks at [facility].  She would also like to know if an MRI will help?

## 2017-10-08 NOTE — Telephone Encounter (Signed)
Send her labs to [facility].  I do not recommend MRI at this time.  As long as no fever, redness, increased swelling, would not do further imaging at this time.

## 2017-10-10 NOTE — Telephone Encounter (Signed)
Patient is aware and lab results faxed and confirmed.

## 2017-10-12 DIAGNOSIS — R52 Pain, unspecified: Secondary | ICD-10-CM | POA: Diagnosis not present

## 2017-10-12 DIAGNOSIS — M792 Neuralgia and neuritis, unspecified: Secondary | ICD-10-CM | POA: Diagnosis not present

## 2017-10-12 DIAGNOSIS — M62838 Other muscle spasm: Secondary | ICD-10-CM | POA: Diagnosis not present

## 2017-10-12 DIAGNOSIS — M5481 Occipital neuralgia: Secondary | ICD-10-CM | POA: Diagnosis not present

## 2017-10-12 DIAGNOSIS — G43719 Chronic migraine without aura, intractable, without status migrainosus: Secondary | ICD-10-CM | POA: Diagnosis not present

## 2017-10-21 DIAGNOSIS — M542 Cervicalgia: Secondary | ICD-10-CM | POA: Diagnosis not present

## 2017-10-21 DIAGNOSIS — M545 Low back pain: Secondary | ICD-10-CM | POA: Diagnosis not present

## 2017-10-21 DIAGNOSIS — G894 Chronic pain syndrome: Secondary | ICD-10-CM | POA: Diagnosis not present

## 2017-10-21 DIAGNOSIS — G609 Hereditary and idiopathic neuropathy, unspecified: Secondary | ICD-10-CM | POA: Diagnosis not present

## 2017-10-21 DIAGNOSIS — M4302 Spondylolysis, cervical region: Secondary | ICD-10-CM | POA: Diagnosis not present

## 2017-10-21 DIAGNOSIS — M791 Myalgia, unspecified site: Secondary | ICD-10-CM | POA: Diagnosis not present

## 2017-10-21 DIAGNOSIS — G8929 Other chronic pain: Secondary | ICD-10-CM | POA: Diagnosis not present

## 2017-10-21 DIAGNOSIS — M549 Dorsalgia, unspecified: Secondary | ICD-10-CM | POA: Diagnosis not present

## 2017-11-14 DIAGNOSIS — M533 Sacrococcygeal disorders, not elsewhere classified: Secondary | ICD-10-CM | POA: Diagnosis not present

## 2017-11-14 DIAGNOSIS — S300XXD Contusion of lower back and pelvis, subsequent encounter: Secondary | ICD-10-CM | POA: Diagnosis not present

## 2017-11-14 DIAGNOSIS — M542 Cervicalgia: Secondary | ICD-10-CM | POA: Diagnosis not present

## 2017-11-14 DIAGNOSIS — M5417 Radiculopathy, lumbosacral region: Secondary | ICD-10-CM | POA: Diagnosis not present

## 2017-12-13 DIAGNOSIS — M545 Low back pain: Secondary | ICD-10-CM | POA: Diagnosis not present

## 2017-12-13 DIAGNOSIS — G894 Chronic pain syndrome: Secondary | ICD-10-CM | POA: Diagnosis not present

## 2017-12-13 DIAGNOSIS — G8929 Other chronic pain: Secondary | ICD-10-CM | POA: Diagnosis not present

## 2017-12-28 DIAGNOSIS — L230 Allergic contact dermatitis due to metals: Secondary | ICD-10-CM | POA: Diagnosis not present

## 2017-12-28 DIAGNOSIS — J302 Other seasonal allergic rhinitis: Secondary | ICD-10-CM | POA: Diagnosis not present

## 2018-01-01 DIAGNOSIS — J209 Acute bronchitis, unspecified: Secondary | ICD-10-CM | POA: Diagnosis not present

## 2018-01-03 DIAGNOSIS — H52203 Unspecified astigmatism, bilateral: Secondary | ICD-10-CM | POA: Diagnosis not present

## 2018-01-03 DIAGNOSIS — H1013 Acute atopic conjunctivitis, bilateral: Secondary | ICD-10-CM | POA: Diagnosis not present

## 2018-01-03 DIAGNOSIS — H04123 Dry eye syndrome of bilateral lacrimal glands: Secondary | ICD-10-CM | POA: Diagnosis not present

## 2018-01-03 DIAGNOSIS — H25813 Combined forms of age-related cataract, bilateral: Secondary | ICD-10-CM | POA: Diagnosis not present

## 2018-01-03 DIAGNOSIS — H02886 Meibomian gland dysfunction of left eye, unspecified eyelid: Secondary | ICD-10-CM | POA: Diagnosis not present

## 2018-01-03 DIAGNOSIS — H11002 Unspecified pterygium of left eye: Secondary | ICD-10-CM | POA: Diagnosis not present

## 2018-01-03 DIAGNOSIS — H5203 Hypermetropia, bilateral: Secondary | ICD-10-CM | POA: Diagnosis not present

## 2018-01-03 DIAGNOSIS — H0259 Other disorders affecting eyelid function: Secondary | ICD-10-CM | POA: Diagnosis not present

## 2018-01-03 DIAGNOSIS — H02883 Meibomian gland dysfunction of right eye, unspecified eyelid: Secondary | ICD-10-CM | POA: Diagnosis not present

## 2018-01-07 DIAGNOSIS — J019 Acute sinusitis, unspecified: Secondary | ICD-10-CM | POA: Diagnosis not present

## 2018-01-10 ENCOUNTER — Encounter: Payer: Self-pay | Admitting: Family Medicine

## 2018-01-10 ENCOUNTER — Ambulatory Visit (INDEPENDENT_AMBULATORY_CARE_PROVIDER_SITE_OTHER): Payer: Medicare Other | Admitting: Family Medicine

## 2018-01-10 VITALS — BP 134/80 | HR 82 | Temp 98.2°F | Wt 253.1 lb

## 2018-01-10 DIAGNOSIS — J209 Acute bronchitis, unspecified: Secondary | ICD-10-CM

## 2018-01-10 MED ORDER — DOXYCYCLINE HYCLATE 100 MG PO CAPS: 100.0000 mg | ORAL_CAPSULE | Freq: Two times a day (BID) | ORAL | 0 refills | Status: DC

## 2018-01-10 NOTE — Progress Notes (Signed)
Subjective:     Patient ID: Ashley Castaneda, female   DOB: [birth year], 67 y.o.   MRN: [id number]  HPI Patient seen with respiratory illness which started about 2 weeks ago. She's had some cough and nasal congestion predominantly. 9 days ago she went to urgent care and was diagnosed with "bronchitis". She thinks she had low-grade fever then. She was prescribed albuterol inhaler and Zithromax. She continued to have coughing and this past Saturday went back to urgent care and was prescribed multiple things including another course of Zithromax and oral prednisone -she got neither these filled. She's not had any recurrent fever.  Cough is sometimes productive and sometimes dry. She has had some thick yellow mucus. She states she has some subjective shortness of breath but her O2 sats of been stable. No chest pain. No facial pain.  Past Medical History:  Diagnosis Date  . Adenomatous colon polyp 09/2008  . ALLERGIC RHINITIS 07/01/2008  . ANXIETY 07/01/2008  . BACK PAIN, THORACIC REGION 03/04/2010  . BRONCHITIS, CHRONIC 07/01/2008  . CARPAL TUNNEL SYNDROME, HX OF 07/01/2008  . CHRONIC OBSTRUCTIVE PULMONARY DISEASE, ACUTE EXACERBATION 08/07/2010  . DEPRESSION 07/01/2008  . DVT, HX OF 07/01/2008  . ECZEMA 05/29/2009  . FACIAL PAIN 12/25/2009  . FIBROMYALGIA 07/01/2008  . GERD 07/01/2008  . Hiatal hernia   . Hyperlipidemia   . LEG PAIN, BILATERAL 07/01/2008  . MIGRAINES, HX OF 07/01/2008  . OCCIPITAL NEURALGIA 07/01/2008  . OSTEOARTHRITIS 07/01/2008  . Other specified trigeminal nerve disorders 07/01/2008  . REFLEX SYMPATHETIC DYSTROPHY 07/01/2008  . Trigeminal neuralgia 05/21/2009  . Unspecified vitamin D deficiency 03/31/2010   Past Surgical History:  Procedure Laterality Date  . CARPAL TUNNEL RELEASE  2009   RIGHT  . HEAD CRANIECTOMY  1994   MICROVASULAR DECOMPRESSION  . LAVH     BSO  . LEFT KNEE SURGERY  1989  . OCCIPITAL NERVE STIMULATOR INSERTION    . RIGHT FOOT BONE SPUR  1987  . RIGHT FOOT  SURGERY  1998   BAD CUT  . RIGHT JAW SURGERY  2002  . RIGHT KNEE SURGERY  1990  . [place]  . RIGHT THUMB SUGERY  2004  . TONSILLECTOMY  1957    reports that she quit smoking about 5 years ago. Her smoking use included cigarettes. She has a 60.00 pack-year smoking history. She uses smokeless tobacco. She reports that she does not drink alcohol or use drugs. family history includes Heart attack in her father and maternal grandmother; Hypertension in her father; Ovarian cancer (age of onset: 75) in her mother; Prostate cancer in her father; Stroke in her paternal grandmother. Allergies  Allergen Reactions  . Cefazolin     Ancef - in surgery, swelled up, turned blue, heart problems  . Carbamazepine Other (See Comments)    REACTION: confusion  . Venlafaxine Other (See Comments)  . Baclofen     REACTION: confusion  . Ceclor [Cefaclor]     hives  . Duloxetine Other (See Comments)  . Fentanyl     REACTION: nausea and vomiting  . Gabapentin     REACTION: confusion  . Milnacipran Other (See Comments)  . Oxcarbazepine     REACTION: confusion  . Pregabalin Other (See Comments)  . Topiramate Other (See Comments)  . Zonisamide Other (See Comments)     Review of Systems  Constitutional: Positive for fatigue. Negative for chills and fever.  HENT: Positive for congestion and sinus pressure.  Respiratory: Positive for cough and shortness of breath.   Cardiovascular: Negative for chest pain and palpitations.       Objective:   Physical Exam  Constitutional: She appears well-developed and well-nourished.  Cardiovascular: Normal rate and regular rhythm.  Pulmonary/Chest: Effort normal and breath sounds normal. No respiratory distress. She has no wheezes. She has no rales.  Patient no respiratory distress. Pulse oximetry 97%       Assessment:     Upper respiratory infection with cough. Patient in no respiratory distress. This may be viral but  patient is high risk for complications    Plan:     -We decided to go and cover with doxycycline 100 mg twice daily for 10 days given her persistent productive cough -If not improved in one week get chest x-ray to further evaluate -Follow-up immediately for any fever or increasing shortness of breath  Eulas Post MD [facility]

## 2018-01-10 NOTE — Patient Instructions (Signed)
Follow up for any fever or increased shortness of breath. 

## 2018-01-17 DIAGNOSIS — L230 Allergic contact dermatitis due to metals: Secondary | ICD-10-CM | POA: Diagnosis not present

## 2018-01-19 DIAGNOSIS — L239 Allergic contact dermatitis, unspecified cause: Secondary | ICD-10-CM | POA: Diagnosis not present

## 2018-01-23 DIAGNOSIS — L230 Allergic contact dermatitis due to metals: Secondary | ICD-10-CM | POA: Diagnosis not present

## 2018-01-23 DIAGNOSIS — J302 Other seasonal allergic rhinitis: Secondary | ICD-10-CM | POA: Diagnosis not present

## 2018-01-31 DIAGNOSIS — G894 Chronic pain syndrome: Secondary | ICD-10-CM | POA: Diagnosis not present

## 2018-01-31 DIAGNOSIS — M533 Sacrococcygeal disorders, not elsewhere classified: Secondary | ICD-10-CM | POA: Diagnosis not present

## 2018-01-31 DIAGNOSIS — M542 Cervicalgia: Secondary | ICD-10-CM | POA: Diagnosis not present

## 2018-01-31 DIAGNOSIS — M545 Low back pain: Secondary | ICD-10-CM | POA: Diagnosis not present

## 2018-02-02 DIAGNOSIS — G43719 Chronic migraine without aura, intractable, without status migrainosus: Secondary | ICD-10-CM | POA: Diagnosis not present

## 2018-02-02 DIAGNOSIS — M62838 Other muscle spasm: Secondary | ICD-10-CM | POA: Diagnosis not present

## 2018-02-02 DIAGNOSIS — M792 Neuralgia and neuritis, unspecified: Secondary | ICD-10-CM | POA: Diagnosis not present

## 2018-02-02 DIAGNOSIS — M5481 Occipital neuralgia: Secondary | ICD-10-CM | POA: Diagnosis not present

## 2018-02-03 DIAGNOSIS — Z885 Allergy status to narcotic agent status: Secondary | ICD-10-CM | POA: Diagnosis not present

## 2018-02-03 DIAGNOSIS — Z888 Allergy status to other drugs, medicaments and biological substances status: Secondary | ICD-10-CM | POA: Diagnosis not present

## 2018-02-03 DIAGNOSIS — H25813 Combined forms of age-related cataract, bilateral: Secondary | ICD-10-CM | POA: Diagnosis not present

## 2018-02-03 DIAGNOSIS — H2513 Age-related nuclear cataract, bilateral: Secondary | ICD-10-CM | POA: Diagnosis not present

## 2018-02-03 DIAGNOSIS — Z882 Allergy status to sulfonamides status: Secondary | ICD-10-CM | POA: Diagnosis not present

## 2018-02-03 DIAGNOSIS — H11002 Unspecified pterygium of left eye: Secondary | ICD-10-CM | POA: Diagnosis not present

## 2018-02-03 DIAGNOSIS — Z881 Allergy status to other antibiotic agents status: Secondary | ICD-10-CM | POA: Diagnosis not present

## 2018-02-03 DIAGNOSIS — H04123 Dry eye syndrome of bilateral lacrimal glands: Secondary | ICD-10-CM | POA: Diagnosis not present

## 2018-02-03 DIAGNOSIS — H16141 Punctate keratitis, right eye: Secondary | ICD-10-CM | POA: Diagnosis not present

## 2018-02-03 DIAGNOSIS — H5203 Hypermetropia, bilateral: Secondary | ICD-10-CM | POA: Diagnosis not present

## 2018-02-03 DIAGNOSIS — H1013 Acute atopic conjunctivitis, bilateral: Secondary | ICD-10-CM | POA: Diagnosis not present

## 2018-02-03 DIAGNOSIS — H02886 Meibomian gland dysfunction of left eye, unspecified eyelid: Secondary | ICD-10-CM | POA: Diagnosis not present

## 2018-02-03 DIAGNOSIS — H11442 Conjunctival cysts, left eye: Secondary | ICD-10-CM | POA: Diagnosis not present

## 2018-02-03 DIAGNOSIS — H11823 Conjunctivochalasis, bilateral: Secondary | ICD-10-CM | POA: Diagnosis not present

## 2018-02-03 DIAGNOSIS — G500 Trigeminal neuralgia: Secondary | ICD-10-CM | POA: Diagnosis not present

## 2018-02-03 DIAGNOSIS — Z886 Allergy status to analgesic agent status: Secondary | ICD-10-CM | POA: Diagnosis not present

## 2018-02-03 DIAGNOSIS — H02883 Meibomian gland dysfunction of right eye, unspecified eyelid: Secondary | ICD-10-CM | POA: Diagnosis not present

## 2018-02-03 DIAGNOSIS — H02834 Dermatochalasis of left upper eyelid: Secondary | ICD-10-CM | POA: Diagnosis not present

## 2018-02-03 DIAGNOSIS — H0289 Other specified disorders of eyelid: Secondary | ICD-10-CM | POA: Diagnosis not present

## 2018-02-03 DIAGNOSIS — H52203 Unspecified astigmatism, bilateral: Secondary | ICD-10-CM | POA: Diagnosis not present

## 2018-02-14 DIAGNOSIS — G894 Chronic pain syndrome: Secondary | ICD-10-CM | POA: Diagnosis not present

## 2018-02-14 DIAGNOSIS — M5417 Radiculopathy, lumbosacral region: Secondary | ICD-10-CM | POA: Diagnosis not present

## 2018-02-15 ENCOUNTER — Encounter: Payer: Self-pay | Admitting: Family Medicine

## 2018-02-15 ENCOUNTER — Ambulatory Visit (INDEPENDENT_AMBULATORY_CARE_PROVIDER_SITE_OTHER): Payer: Medicare Other | Admitting: Family Medicine

## 2018-02-15 VITALS — BP 122/84 | HR 89 | Temp 98.2°F | Ht 63.0 in | Wt 250.4 lb

## 2018-02-15 DIAGNOSIS — M7918 Myalgia, other site: Secondary | ICD-10-CM

## 2018-02-15 NOTE — Progress Notes (Signed)
Subjective:     Patient ID: Ashley Castaneda, female   DOB: April 09, 1951, 67 y.o.   MRN: [id number]  HPI Patient is seen with some ongoing right buttock pain. She has chronic low back pain. She was seen at pain management clinic in [facility] and 07/06/2017 had spinal cord stimulator placed. She developed some pain afterwards and states she felt like a ""boil" was in this region. Apparently the leads were placed in her lower lumbar area and battery was in her buttocks.   She was placed by pain clinic on clindamycin initially after surgery. She ended up having one course of prednisone and a couple courses of clindamycin without any further improvement. She eventually on February 20 went in for surgery to have the battery removed wound culture and Gram stain unremarkable. Acid-fast bacilli culture negative. Fungal culture negative. There was consideration of things like seroma or hematoma contributing to her pain.  She was seen here in March and we obtained an ultra sound which showed no evidence for a seroma, hematoma, or any other concerning abnormalities. She did feel some better eventually after having the battery until around May started having recurrent pain.. She states she has severe pain a few centimeters inferior to where her incision was. Pain feels it's fairly deep .  She has not noted any superficial overlying redness or ecchymosis or warmth. She has tried Lidoderm patch without relief.  Patient relates she even had some metal testing to make sure she did have evidence for allergic reaction and this came back unremarkable. She is also followed by another pain management specialist at [facility] but does not see them until October. She reportedly was released from her pain management clinic in [facility] yesterday. She does not take any chronic medications.  Past Medical History:  Diagnosis Date  . Adenomatous colon polyp 09/2008  . ALLERGIC RHINITIS 07/01/2008  . ANXIETY 07/01/2008  . BACK PAIN,  THORACIC REGION 03/04/2010  . BRONCHITIS, CHRONIC 07/01/2008  . CARPAL TUNNEL SYNDROME, HX OF 07/01/2008  . CHRONIC OBSTRUCTIVE PULMONARY DISEASE, ACUTE EXACERBATION 08/07/2010  . DEPRESSION 07/01/2008  . DVT, HX OF 07/01/2008  . ECZEMA 05/29/2009  . FACIAL PAIN 12/25/2009  . FIBROMYALGIA 07/01/2008  . GERD 07/01/2008  . Hiatal hernia   . Hyperlipidemia   . LEG PAIN, BILATERAL 07/01/2008  . MIGRAINES, HX OF 07/01/2008  . OCCIPITAL NEURALGIA 07/01/2008  . OSTEOARTHRITIS 07/01/2008  . Other specified trigeminal nerve disorders 07/01/2008  . REFLEX SYMPATHETIC DYSTROPHY 07/01/2008  . Trigeminal neuralgia 05/21/2009  . Unspecified vitamin D deficiency 03/31/2010   Past Surgical History:  Procedure Laterality Date  . CARPAL TUNNEL RELEASE  2009   RIGHT  . HEAD CRANIECTOMY  1994   MICROVASULAR DECOMPRESSION  . LAVH     BSO  . LEFT KNEE SURGERY  1989  . OCCIPITAL NERVE STIMULATOR INSERTION    . RIGHT FOOT BONE SPUR  1987  . RIGHT FOOT SURGERY  1998   BAD CUT  . RIGHT JAW SURGERY  2002  . RIGHT KNEE SURGERY  1990  . [place]  . RIGHT THUMB SUGERY  2004  . TONSILLECTOMY  1957    reports that she quit smoking about 6 years ago. Her smoking use included cigarettes. She has a 60.00 pack-year smoking history. She uses smokeless tobacco. She reports that she does not drink alcohol or use drugs. family history includes Heart attack in her father and maternal grandmother; Hypertension in her father; Ovarian cancer (  age of onset: 81) in her mother; Prostate cancer in her father; Stroke in her paternal grandmother. Allergies  Allergen Reactions  . Cefazolin Anaphylaxis, Swelling and Other (See Comments)    Ancef - in surgery, swelled up, turned blue, heart problems Turns Blue  . Baclofen Other (See Comments)    REACTION: confusion REACTION: confusion REACTION: confusion REACTION: confusion  . Carbamazepine Other (See Comments)    REACTION: confusion Other  reaction(s): Confusion Confusion,Bad reactions to all seizure medicine  . Duloxetine Other (See Comments)  . Fentanyl Nausea And Vomiting and Other (See Comments)    REACTION: nausea and vomiting  . Gabapentin Other (See Comments)    REACTION: confusion  . Naproxen Other (See Comments)    Elevates blood pressure  . Oxcarbazepine Other (See Comments)    REACTION: confusion Other reaction(s): Other REACTION: confusion REACTION: confusion REACTION: confusion   . Pregabalin Other (See Comments)    Other reaction(s): Other  . Serotonin Reuptake Inhibitors (Ssris) Other (See Comments)  . Topiramate Other (See Comments)    Other reaction(s): Unknown  . Venlafaxine Other (See Comments)    Other reaction(s): Unknown  . Zonisamide Other (See Comments)    Other reaction(s): Other  . Ceclor [Cefaclor]     hives  . Milnacipran Other (See Comments)     Review of Systems  Constitutional: Negative for chills and fever.  Cardiovascular: Negative for chest pain.  Genitourinary: Negative for dysuria.  Musculoskeletal: Positive for back pain.  Skin: Negative for rash.  Hematological: Negative for adenopathy.       Objective:   Physical Exam  Constitutional: She appears well-developed and well-nourished.  Cardiovascular: Normal rate and regular rhythm.  Pulmonary/Chest: Effort normal and breath sounds normal.  Skin:  Patient has scar right upper buttock region from site of previous surgery. Her pain is an area approximately 4 cm inferior to this. No induration. No fluctuance. No erythema. No warmth. No masses       Assessment:     Persistent right buttock pain following spinal cord stimulator with placement of battery pack into the right buttock last December. She had multiple cultures done when her battery pack was removed which did not show any infection. Previous ultrasound did not show any seroma or hematoma. Patient is concerned whether there is some deeper infection or other  cause for her chronic pain    Plan:     -She is encouraged to continue close follow-up with pain management specialist at [facility] -We discussed possible further imaging with CT pelvis to further evaluate but doubt any abscess or deeper infection based on exam  Eulas Post MD [facility]

## 2018-02-15 NOTE — Patient Instructions (Signed)
I will call radiology to discuss setting up CT scan to further evaluate.

## 2018-02-22 ENCOUNTER — Telehealth: Payer: Self-pay | Admitting: Family Medicine

## 2018-02-22 NOTE — Telephone Encounter (Signed)
Okay to order CT?

## 2018-02-22 NOTE — Telephone Encounter (Signed)
Copied from [place] [id number]. Topic: Quick Communication - See Telephone Encounter >> Feb 22, 2018  3:06 PM Antonieta Iba C wrote: CRM for notification. See Telephone encounter for: 02/22/18.  Pt says that she was seen and advised that someone would be calling her for a CT.  pt says that she didn't receive a call from CT, please assist further.

## 2018-02-23 ENCOUNTER — Encounter: Payer: Self-pay | Admitting: Family Medicine

## 2018-02-23 ENCOUNTER — Other Ambulatory Visit: Payer: Self-pay | Admitting: Family Medicine

## 2018-02-23 DIAGNOSIS — M7918 Myalgia, other site: Secondary | ICD-10-CM

## 2018-02-23 NOTE — Telephone Encounter (Signed)
I called [place] [facility] and Erline Levine entered the order.  Message sent to Dr Elease Hashimoto to sign off on the order.

## 2018-02-23 NOTE — Telephone Encounter (Signed)
I had tried to order CT pelvis with contrast but was having to use diagnosis of right buttock pain and could not get it to go through.  We can try to put in order again

## 2018-02-24 ENCOUNTER — Other Ambulatory Visit: Payer: Self-pay

## 2018-02-24 ENCOUNTER — Other Ambulatory Visit: Payer: Self-pay | Admitting: Family Medicine

## 2018-02-24 ENCOUNTER — Other Ambulatory Visit (INDEPENDENT_AMBULATORY_CARE_PROVIDER_SITE_OTHER): Payer: Medicare Other

## 2018-02-24 DIAGNOSIS — L03317 Cellulitis of buttock: Principal | ICD-10-CM

## 2018-02-24 DIAGNOSIS — M7918 Myalgia, other site: Secondary | ICD-10-CM

## 2018-02-24 DIAGNOSIS — Z472 Encounter for removal of internal fixation device: Secondary | ICD-10-CM

## 2018-02-24 DIAGNOSIS — M7601 Gluteal tendinitis, right hip: Secondary | ICD-10-CM

## 2018-02-24 DIAGNOSIS — L0231 Cutaneous abscess of buttock: Secondary | ICD-10-CM

## 2018-02-24 DIAGNOSIS — Z01812 Encounter for preprocedural laboratory examination: Secondary | ICD-10-CM | POA: Diagnosis not present

## 2018-02-24 DIAGNOSIS — R52 Pain, unspecified: Secondary | ICD-10-CM

## 2018-02-24 DIAGNOSIS — M791 Myalgia, unspecified site: Secondary | ICD-10-CM

## 2018-02-24 NOTE — Addendum Note (Signed)
Addended by: Rene Kocher on: 02/24/2018 03:55 PM   Modules accepted: Orders

## 2018-02-27 LAB — BASIC METABOLIC PANEL
BUN/Creatinine Ratio: 23 (calc) — ABNORMAL HIGH (ref 6–22)
BUN: 24 mg/dL (ref 7–25)
CALCIUM: 9 mg/dL (ref 8.6–10.4)
CHLORIDE: 101 mmol/L (ref 98–110)
CO2: 29 mmol/L (ref 20–32)
Creat: 1.03 mg/dL — ABNORMAL HIGH (ref 0.50–0.99)
Glucose, Bld: 94 mg/dL (ref 65–99)
POTASSIUM: 4.4 mmol/L (ref 3.5–5.3)
Sodium: 139 mmol/L (ref 135–146)

## 2018-02-28 ENCOUNTER — Encounter: Payer: Self-pay | Admitting: *Deleted

## 2018-02-28 ENCOUNTER — Ambulatory Visit (INDEPENDENT_AMBULATORY_CARE_PROVIDER_SITE_OTHER)
Admission: RE | Admit: 2018-02-28 | Discharge: 2018-02-28 | Disposition: A | Discharge status: Home / Self Care | Payer: Medicare Other | Source: Ambulatory Visit | Attending: Family Medicine | Admitting: Family Medicine

## 2018-02-28 DIAGNOSIS — R102 Pelvic and perineal pain: Secondary | ICD-10-CM | POA: Diagnosis not present

## 2018-02-28 DIAGNOSIS — R109 Unspecified abdominal pain: Secondary | ICD-10-CM

## 2018-02-28 DIAGNOSIS — L03317 Cellulitis of buttock: Principal | ICD-10-CM

## 2018-02-28 DIAGNOSIS — L0231 Cutaneous abscess of buttock: Secondary | ICD-10-CM | POA: Diagnosis not present

## 2018-02-28 MED ORDER — IOPAMIDOL (ISOVUE-300) INJECTION 61%
80.0000 mL | Freq: Once | INTRAVENOUS | Status: AC | PRN
  Administered 2018-02-28: 80 mL via INTRAVENOUS

## 2018-03-01 NOTE — Telephone Encounter (Signed)
This was a response received back from the patient.  She is adament about seeing someone else about this pain, other than the pain doctor at [facility]--she has the appt in October.  She feels there is a boil in this area.  Please advise.  Thank you.    Thank you for the fast reply of my test results.  I do not agree that this is scar tissue because it is hurting too bad. It feels like a boil.  I would like to see a surgeon or someone who can get to the bottom of this problem.  My appt. with the other pain management doctor at [facility] is not until 04-2018.  I do not know how long I can tolerate this pain. It is all I can do to sit down.  Please help me with this pain.  Thank you,  Ashley Castaneda

## 2018-03-06 NOTE — Telephone Encounter (Signed)
Hi,  My pain is below the incision.  I know when they implanted the stimulator that they made a pocket from fat and that is what the stimulator was placed in.  I can feel that pocket now but the pain is below that.    I need some help no matter what this is.  It was all I could do to sit down last night.  I would like to see a surgeon and see what they say.  I feel like something has to be done, either take the scar tissue out if that is the problem or see what is causing the problem.    I appreciate your help. If I was not in this amount of pain then I would not be begging for help.   Ashley Castaneda     The following is a message that was received back from the patient.

## 2018-03-08 DIAGNOSIS — G9050 Complex regional pain syndrome I, unspecified: Secondary | ICD-10-CM | POA: Diagnosis not present

## 2018-03-08 DIAGNOSIS — G500 Trigeminal neuralgia: Secondary | ICD-10-CM | POA: Diagnosis not present

## 2018-03-08 DIAGNOSIS — G629 Polyneuropathy, unspecified: Secondary | ICD-10-CM | POA: Diagnosis not present

## 2018-03-08 DIAGNOSIS — G4451 Hemicrania continua: Secondary | ICD-10-CM | POA: Diagnosis not present

## 2018-03-13 DIAGNOSIS — G8929 Other chronic pain: Secondary | ICD-10-CM | POA: Diagnosis not present

## 2018-03-13 DIAGNOSIS — G609 Hereditary and idiopathic neuropathy, unspecified: Secondary | ICD-10-CM | POA: Diagnosis not present

## 2018-03-13 DIAGNOSIS — M791 Myalgia, unspecified site: Secondary | ICD-10-CM | POA: Diagnosis not present

## 2018-03-13 DIAGNOSIS — G894 Chronic pain syndrome: Secondary | ICD-10-CM | POA: Diagnosis not present

## 2018-03-13 DIAGNOSIS — M545 Low back pain: Secondary | ICD-10-CM | POA: Diagnosis not present

## 2018-03-13 DIAGNOSIS — M4302 Spondylolysis, cervical region: Secondary | ICD-10-CM | POA: Diagnosis not present

## 2018-03-21 NOTE — Telephone Encounter (Signed)
Good morning,    Did you see my message to you last week about seeing the PA at [facility]?    I will see any surgeon or an infection dr as Rob Hickman said, which ever you want.  They also want me to see a pain psy.  They are not doing any of the referral because they said they don't know anyone in this area.    Thank you for your help,  Ashley Castaneda   Dr. Elease Hashimoto,  Please advise on the surgical referral.  Any specific doctor?  Thanks

## 2018-03-23 DIAGNOSIS — M65331 Trigger finger, right middle finger: Secondary | ICD-10-CM | POA: Diagnosis not present

## 2018-04-06 ENCOUNTER — Telehealth: Payer: Self-pay

## 2018-04-06 DIAGNOSIS — M7918 Myalgia, other site: Secondary | ICD-10-CM

## 2018-04-06 NOTE — Telephone Encounter (Signed)
Copied from [place] [id number]. Topic: Referral - Status >> Apr 06, 2018  4:08 PM Bea Graff, NT wrote: Reason for CRM: Sarah with [facility] calling and states that the referral stated pt was to be seen for abdominal pain, but pat states that is not her issue. She needed to be seen for buttocks pain. Judson Roch states that pt had a spinal cord stimulator placed in her back and the battery was in her buttocks and that the pt may need to see a neurologist instead of [facility] as this would not be a need for surgery but more for the nerves possibly. CB#: [phone].

## 2018-04-07 NOTE — Telephone Encounter (Signed)
We agree.  Pt had insisted on seeing surgeon.  I think at this point this is more of a chronic pain issue.  I would explain to her surgeon does not feel consult is warranted at this time.  She is still seeing a pain specialist at [facility], I believe. I don't really think there is a clear indication for neurologist at this time.  My suggestion is that she get back in with pain management specialist at this point.

## 2018-04-10 NOTE — Telephone Encounter (Signed)
I called Ashley Castaneda at the surgery center and informed her of the message below.  Left a message for the pt to return my call and CRM also created.

## 2018-04-10 NOTE — Telephone Encounter (Signed)
Patient called back and I informed her of the message below.  She stated she is in a lot of pain and I advised her a number of times to call the pain management specialist as recommended below.  She stated she has sent a number of emails to [place] advised she see an infectious disease doctor and that Dr Elease Hashimoto needed to place the referral for this as they do not know about any doctors here.  Patient stated she cannot ride back and forth there and I advised the pt if they referred her to a specialist they could place the order as this is often done by our referral coordinator here and again advised she call their office.

## 2018-04-12 ENCOUNTER — Other Ambulatory Visit: Payer: Self-pay

## 2018-04-12 MED ORDER — LIDOCAINE 5 % EX PTCH: 1.0000 | MEDICATED_PATCH | CUTANEOUS | 1 refills | Status: AC

## 2018-04-12 NOTE — Telephone Encounter (Signed)
Let's try Lidoderm patch 5%- apply for 12 hours of day and leave off for 12 hours.  #30 with one refill.

## 2018-04-12 NOTE — Telephone Encounter (Signed)
Pt called back stating that she is in a lot of pain. Pt states that she feels pain management will not help and that she feels she is not being taken seriously. Pt is requesting a call back from Dr. Elease Hashimoto or his nurse. Please advise.

## 2018-04-12 NOTE — Telephone Encounter (Signed)
Called patient and let her know that we sent the patches to her pharmacy. She stated that they did not help her.  Patient feels like she has an infection and requested labs.  CBC w/Diff and CRP. Please order.

## 2018-04-12 NOTE — Telephone Encounter (Signed)
Called patient and she stated that she is no longer a patient of the doctor that did her back stimulator. She does see a pain specialist, a PA at [facility] does not know specialists in [place] and not able to help patient.  1) Pain is worse in right buttock area, feels like a boil is getting ready to pop. Stimulator and battery are removed. 2) Patient needs help, hard to sleep or move, pain is worse than ever. Patient is not sure what she needs and who can help her. She is requesting someone here in [place]. Does she need an MRI? Gel Ring? Not getting help from her previous pain doctor. This is getting worse.  Appointment? PA at [facility] stated that she needs to use a lidoderm patch 12 hours and see infectious disease specialist and they do not know who to contact in [place]. No longer a patient of Dr. Nancy Fetter who was at [facility] in [facility] and she has in writing.  She feels like she needs to go to the ER. She does not want drugs, she wants help to stop the pain and discomfort.  Please advise. Also have you received notes from [facility]?

## 2018-04-12 NOTE — Addendum Note (Signed)
Addended by: Eulas Post on: 04/12/2018 09:52 PM   Modules accepted: Orders

## 2018-04-12 NOTE — Telephone Encounter (Signed)
Pt still concerned she has occult infection right buttock at site of prior spinal cord stimulator placement- although no overlying erythema, fever, chills, or evidence for abscess from prior imaging.  She had requested possible referral to ID.    I suggested we repeat CBC with dif and CRP.  If remains normal, doubt ID referral would  Be very useful at this time.

## 2018-04-13 ENCOUNTER — Other Ambulatory Visit (INDEPENDENT_AMBULATORY_CARE_PROVIDER_SITE_OTHER): Payer: Medicare Other

## 2018-04-13 DIAGNOSIS — M7918 Myalgia, other site: Secondary | ICD-10-CM

## 2018-04-13 LAB — CBC WITH DIFFERENTIAL/PLATELET
Basophils Absolute: 0.1 10*3/uL (ref 0.0–0.1)
Basophils Relative: 2.1 % (ref 0.0–3.0)
Eosinophils Absolute: 0.2 10*3/uL (ref 0.0–0.7)
Eosinophils Relative: 3.2 % (ref 0.0–5.0)
HEMATOCRIT: 38.3 % (ref 36.0–46.0)
HEMOGLOBIN: 12.7 g/dL (ref 12.0–15.0)
LYMPHS PCT: 26.2 % (ref 12.0–46.0)
Lymphs Abs: 1.5 10*3/uL (ref 0.7–4.0)
MCHC: 33.3 g/dL (ref 30.0–36.0)
MCV: 90 fl (ref 78.0–100.0)
MONO ABS: 0.8 10*3/uL (ref 0.1–1.0)
Monocytes Relative: 13.7 % — ABNORMAL HIGH (ref 3.0–12.0)
Neutro Abs: 3.1 10*3/uL (ref 1.4–7.7)
Neutrophils Relative %: 54.8 % (ref 43.0–77.0)
Platelets: 316 10*3/uL (ref 150.0–400.0)
RBC: 4.25 Mil/uL (ref 3.87–5.11)
RDW: 13.3 % (ref 11.5–15.5)
WBC: 5.6 10*3/uL (ref 4.0–10.5)

## 2018-04-13 LAB — C-REACTIVE PROTEIN: CRP: 1.5 mg/dL (ref 0.5–20.0)

## 2018-04-17 NOTE — Telephone Encounter (Signed)
Patient called last week and given the message from Dr. Elease Hashimoto. Patient verbalized an understanding.

## 2018-04-24 ENCOUNTER — Encounter: Payer: Self-pay | Admitting: Family Medicine

## 2018-04-25 ENCOUNTER — Other Ambulatory Visit: Payer: Self-pay

## 2018-04-25 DIAGNOSIS — G609 Hereditary and idiopathic neuropathy, unspecified: Secondary | ICD-10-CM | POA: Diagnosis not present

## 2018-04-25 MED ORDER — MELOXICAM 15 MG PO TABS: 15.0000 mg | ORAL_TABLET | Freq: Every day | ORAL | 3 refills | Status: DC

## 2018-04-25 MED ORDER — TORSEMIDE 10 MG PO TABS: 10.0000 mg | ORAL_TABLET | Freq: Every day | ORAL | 3 refills | Status: DC

## 2018-04-25 MED ORDER — RABEPRAZOLE SODIUM 20 MG PO TBEC: 20.0000 mg | DELAYED_RELEASE_TABLET | Freq: Two times a day (BID) | ORAL | 3 refills | Status: DC

## 2018-04-25 MED ORDER — ALPRAZOLAM 0.5 MG PO TABS: 0.5000 mg | ORAL_TABLET | Freq: Two times a day (BID) | ORAL | 1 refills | Status: DC | PRN

## 2018-04-25 MED ORDER — MONTELUKAST SODIUM 10 MG PO TABS: ORAL_TABLET | ORAL | 3 refills | Status: DC

## 2018-04-25 MED ORDER — POTASSIUM CHLORIDE ER 10 MEQ PO TBCR: EXTENDED_RELEASE_TABLET | ORAL | 3 refills | Status: DC

## 2018-04-25 MED ORDER — FLUTICASONE PROPIONATE 50 MCG/ACT NA SUSP: 2.0000 | Freq: Every day | NASAL | 3 refills | Status: DC | PRN

## 2018-04-28 ENCOUNTER — Other Ambulatory Visit: Payer: Self-pay

## 2018-04-28 MED ORDER — MOMETASONE FUROATE 50 MCG/ACT NA SUSP: 2.0000 | Freq: Every day | NASAL | 1 refills | Status: DC

## 2018-04-28 MED ORDER — BUTALBITAL-APAP-CAFFEINE 50-325-40 MG PO TABS: ORAL_TABLET | ORAL | 0 refills | Status: DC

## 2018-05-04 DIAGNOSIS — G43719 Chronic migraine without aura, intractable, without status migrainosus: Secondary | ICD-10-CM | POA: Diagnosis not present

## 2018-05-04 DIAGNOSIS — M5481 Occipital neuralgia: Secondary | ICD-10-CM | POA: Diagnosis not present

## 2018-05-04 DIAGNOSIS — R52 Pain, unspecified: Secondary | ICD-10-CM | POA: Diagnosis not present

## 2018-05-04 DIAGNOSIS — M792 Neuralgia and neuritis, unspecified: Secondary | ICD-10-CM | POA: Diagnosis not present

## 2018-05-15 ENCOUNTER — Other Ambulatory Visit: Payer: Self-pay | Admitting: Neurology

## 2018-05-23 ENCOUNTER — Other Ambulatory Visit: Payer: Self-pay | Admitting: Neurology

## 2018-05-25 ENCOUNTER — Other Ambulatory Visit: Payer: Self-pay | Admitting: Neurology

## 2018-05-25 DIAGNOSIS — M799 Soft tissue disorder, unspecified: Secondary | ICD-10-CM

## 2018-05-25 DIAGNOSIS — M7918 Myalgia, other site: Secondary | ICD-10-CM

## 2018-05-25 DIAGNOSIS — M7989 Other specified soft tissue disorders: Secondary | ICD-10-CM

## 2018-06-04 ENCOUNTER — Ambulatory Visit
Admission: RE | Admit: 2018-06-04 | Discharge: 2018-06-04 | Disposition: A | Discharge status: Home / Self Care | Payer: Medicare Other | Source: Ambulatory Visit | Attending: Neurology | Admitting: Neurology

## 2018-06-04 DIAGNOSIS — M7918 Myalgia, other site: Secondary | ICD-10-CM

## 2018-06-04 DIAGNOSIS — M799 Soft tissue disorder, unspecified: Secondary | ICD-10-CM

## 2018-06-04 DIAGNOSIS — M7989 Other specified soft tissue disorders: Secondary | ICD-10-CM

## 2018-06-04 DIAGNOSIS — R52 Pain, unspecified: Secondary | ICD-10-CM | POA: Diagnosis not present

## 2018-06-04 MED ORDER — GADOBENATE DIMEGLUMINE 529 MG/ML IV SOLN
20.0000 mL | Freq: Once | INTRAVENOUS | Status: AC | PRN
  Administered 2018-06-04: 20 mL via INTRAVENOUS

## 2018-06-14 ENCOUNTER — Other Ambulatory Visit: Payer: Self-pay

## 2018-06-19 DIAGNOSIS — Z23 Encounter for immunization: Secondary | ICD-10-CM | POA: Diagnosis not present

## 2018-07-10 DIAGNOSIS — H0288A Meibomian gland dysfunction right eye, upper and lower eyelids: Secondary | ICD-10-CM | POA: Diagnosis not present

## 2018-07-10 DIAGNOSIS — H04123 Dry eye syndrome of bilateral lacrimal glands: Secondary | ICD-10-CM | POA: Diagnosis not present

## 2018-07-10 DIAGNOSIS — H11002 Unspecified pterygium of left eye: Secondary | ICD-10-CM | POA: Diagnosis not present

## 2018-07-10 DIAGNOSIS — G4451 Hemicrania continua: Secondary | ICD-10-CM | POA: Diagnosis not present

## 2018-07-10 DIAGNOSIS — H5211 Myopia, right eye: Secondary | ICD-10-CM | POA: Diagnosis not present

## 2018-07-10 DIAGNOSIS — H5202 Hypermetropia, left eye: Secondary | ICD-10-CM | POA: Diagnosis not present

## 2018-07-10 DIAGNOSIS — H25813 Combined forms of age-related cataract, bilateral: Secondary | ICD-10-CM | POA: Diagnosis not present

## 2018-07-10 DIAGNOSIS — H0288B Meibomian gland dysfunction left eye, upper and lower eyelids: Secondary | ICD-10-CM | POA: Diagnosis not present

## 2018-07-10 DIAGNOSIS — H1013 Acute atopic conjunctivitis, bilateral: Secondary | ICD-10-CM | POA: Diagnosis not present

## 2018-07-10 DIAGNOSIS — H01006 Unspecified blepharitis left eye, unspecified eyelid: Secondary | ICD-10-CM | POA: Diagnosis not present

## 2018-07-10 DIAGNOSIS — H01003 Unspecified blepharitis right eye, unspecified eyelid: Secondary | ICD-10-CM | POA: Diagnosis not present

## 2018-07-10 DIAGNOSIS — H52203 Unspecified astigmatism, bilateral: Secondary | ICD-10-CM | POA: Diagnosis not present

## 2018-08-01 DIAGNOSIS — L905 Scar conditions and fibrosis of skin: Secondary | ICD-10-CM | POA: Diagnosis not present

## 2018-08-01 DIAGNOSIS — G609 Hereditary and idiopathic neuropathy, unspecified: Secondary | ICD-10-CM | POA: Diagnosis not present

## 2018-08-01 DIAGNOSIS — R52 Pain, unspecified: Secondary | ICD-10-CM | POA: Diagnosis not present

## 2018-08-01 DIAGNOSIS — R51 Headache: Secondary | ICD-10-CM | POA: Diagnosis not present

## 2018-08-01 DIAGNOSIS — M4302 Spondylolysis, cervical region: Secondary | ICD-10-CM | POA: Diagnosis not present

## 2018-08-01 DIAGNOSIS — G894 Chronic pain syndrome: Secondary | ICD-10-CM | POA: Diagnosis not present

## 2018-08-09 DIAGNOSIS — G894 Chronic pain syndrome: Secondary | ICD-10-CM | POA: Diagnosis not present

## 2018-08-09 DIAGNOSIS — L905 Scar conditions and fibrosis of skin: Secondary | ICD-10-CM | POA: Diagnosis not present

## 2018-08-09 DIAGNOSIS — L309 Dermatitis, unspecified: Secondary | ICD-10-CM | POA: Diagnosis not present

## 2018-08-09 DIAGNOSIS — R52 Pain, unspecified: Secondary | ICD-10-CM | POA: Diagnosis not present

## 2018-08-09 DIAGNOSIS — L299 Pruritus, unspecified: Secondary | ICD-10-CM | POA: Diagnosis not present

## 2018-08-09 DIAGNOSIS — L853 Xerosis cutis: Secondary | ICD-10-CM | POA: Diagnosis not present

## 2018-08-15 DIAGNOSIS — M5481 Occipital neuralgia: Secondary | ICD-10-CM | POA: Diagnosis not present

## 2018-08-15 DIAGNOSIS — G43719 Chronic migraine without aura, intractable, without status migrainosus: Secondary | ICD-10-CM | POA: Diagnosis not present

## 2018-08-15 DIAGNOSIS — M792 Neuralgia and neuritis, unspecified: Secondary | ICD-10-CM | POA: Diagnosis not present

## 2018-08-15 DIAGNOSIS — R52 Pain, unspecified: Secondary | ICD-10-CM | POA: Diagnosis not present

## 2018-08-21 DIAGNOSIS — H25812 Combined forms of age-related cataract, left eye: Secondary | ICD-10-CM | POA: Diagnosis not present

## 2018-08-25 DIAGNOSIS — L905 Scar conditions and fibrosis of skin: Secondary | ICD-10-CM | POA: Diagnosis not present

## 2018-08-25 DIAGNOSIS — R52 Pain, unspecified: Secondary | ICD-10-CM | POA: Diagnosis not present

## 2018-09-04 DIAGNOSIS — R52 Pain, unspecified: Secondary | ICD-10-CM | POA: Diagnosis not present

## 2018-09-04 DIAGNOSIS — L905 Scar conditions and fibrosis of skin: Secondary | ICD-10-CM | POA: Diagnosis not present

## 2018-09-06 ENCOUNTER — Other Ambulatory Visit: Payer: Self-pay

## 2018-09-06 ENCOUNTER — Encounter: Payer: Self-pay | Admitting: Family Medicine

## 2018-09-06 MED ORDER — ALPRAZOLAM 0.5 MG PO TABS: 0.5000 mg | ORAL_TABLET | Freq: Two times a day (BID) | ORAL | 1 refills | Status: DC | PRN

## 2018-09-06 MED ORDER — BUTALBITAL-APAP-CAFFEINE 50-325-40 MG PO TABS: ORAL_TABLET | ORAL | 0 refills | Status: DC

## 2018-09-06 MED ORDER — POTASSIUM CHLORIDE ER 10 MEQ PO TBCR: EXTENDED_RELEASE_TABLET | ORAL | 1 refills | Status: DC

## 2018-09-08 DIAGNOSIS — H1032 Unspecified acute conjunctivitis, left eye: Secondary | ICD-10-CM | POA: Diagnosis not present

## 2018-09-08 DIAGNOSIS — R6889 Other general symptoms and signs: Secondary | ICD-10-CM | POA: Diagnosis not present

## 2018-09-28 DIAGNOSIS — J069 Acute upper respiratory infection, unspecified: Secondary | ICD-10-CM | POA: Diagnosis not present

## 2018-10-18 ENCOUNTER — Ambulatory Visit: Payer: Self-pay | Admitting: Family Medicine

## 2018-10-18 NOTE — Telephone Encounter (Signed)
Noted. Needs follow up if not improving with the antibiotics.

## 2018-10-18 NOTE — Telephone Encounter (Signed)
Pt called in c/o having a tightness in her chest between her breasts and a sharp pain behind her left shoulder blade.   This has been going on a week now.   The pressure is getting worse as she is talking to me per pt.   She is having fever on and off.   Shortness of breath with exertion that is new for her.   She also breaks out in a sweat with much movement.  When I get an URI I end up with bronchitis and then pneumonia if I don't get on antibiotics.    She mentioned she called the urgent care and told them her symptoms.   She got a message (not a phone call) where they called her back and they had ordered an antibiotic for her.   It's at her pharmacy.   So she is going to go pick up the antibiotic.  I suggested she needed to go to the ED based on her symptoms of chest tightness, sweating, shortness of breath with minimal exertion that is all new for her.   She stated,   "I'm not going to the ED".   "I feel this is respiratory because I get bronchitis and then it goes into pneumonia if it's not treated".   She mentioned the left shoulder blade pain is from an accident that injured her back between her shoulder blades that is chronic.    I spoke with the flow coordinator for Dr. Elease Hashimoto and she also agreed she should go to the ED.    I let the pt know however she still replied,   "I'm not going to the ED".  "I don't want to be exposed to the coronavirus going around and I don't think this is my heart".     "I will go pick up the antibiotic that was called in by the urgent care and see if that doesn't help.  I instructed her to please go to the ED if her symptoms do not get better or become worse.   She agreed to this.  I sent these notes to Dr. Erick Blinks office.       Reason for Disposition . [1] Chest pain lasts > 5 minutes AND [2] described as crushing, pressure-like, or heavy  Answer Assessment - Initial Assessment Questions 1. LOCATION: "Where does it hurt?"       Chest pressure  between my breasts.    2. RADIATION: "Does the pain go anywhere else?" (e.g., into neck, jaw, arms, back)     Pain between my shoulder blades. 3. ONSET: "When did the chest pain begin?" (Minutes, hours or days)      A week ago.  It's getting worse the pressure in  My chest.   09/28/2018 I was diagnosed with a virus.  I wasn't having chest pain then.     It does not seem to go away. 4. PATTERN "Does the pain come and go, or has it been constant since it started?"  "Does it get worse with exertion?"      Constant pressure.    She had the flu positive A.   5. DURATION: "How long does it last" (e.g., seconds, minutes, hours)     My lungs don't want to clear up.    Dry cough.   No fever now.    A few minutes I was hot and then it goes away.   I get short of breath if I move around very much.  If I walk to the bathroom maybe 15 feet I'm short of breath.    I used an inhaler I had left over from long time ago.     I have chronic bronchitis.   COPD - I stopped smoking.   6. SEVERITY: "How bad is the pain?"  (e.g., Scale 1-10; mild, moderate, or severe)    - MILD (1-3): doesn't interfere with normal activities     - MODERATE (4-7): interferes with normal activities or awakens from sleep    - SEVERE (8-10): excruciating pain, unable to do any normal activities       I'm comfortable.   It's a constant pressure.   The pain between my shoulder blades is a sharp pain mostly behind the left shoulder blade.   The shoulder blade pain started a few days ago.    7. CARDIAC RISK FACTORS: "Do you have any history of heart problems or risk factors for heart disease?" (e.g., prior heart attack, angina; high blood pressure, diabetes, being overweight, high cholesterol, smoking, or strong family history of heart disease)     Heart is fine.   Stopped smoking 6 yrs ago.   No 8. PULMONARY RISK FACTORS: "Do you have any history of lung disease?"  (e.g., blood clots in lung, asthma, emphysema, birth control pills)     I've had a  blood clot in my right leg 2015.   No blood clots in lungs.    9. CAUSE: "What do you think is causing the chest pain?"     I think it's bronchitis or pneumonia.   I haven't been out.   I'm staying at home.   I went to the grocery store on the 13th.   No exposure to anyone that is positive for the coronavirus.    10. OTHER SYMPTOMS: "Do you have any other symptoms?" (e.g., dizziness, nausea, vomiting, sweating, fever, difficulty breathing, cough)       No vomiting or diarrhea.   I break out in a sweat if I move very much.   This is new.  I'm restricted in my movement because of my back problems.   11. PREGNANCY: "Is there any chance you are pregnant?" "When was your last menstrual period?"       Not asked due to age.  Protocols used: CHEST PAIN-A-AH

## 2018-10-18 NOTE — Telephone Encounter (Signed)
Please review

## 2018-10-18 NOTE — Telephone Encounter (Signed)
RN spoke with patient.  Offered patient appointment with PCP. Patient reports she followed back up with UC and they have prescribed her antibiotic for bronchitis so it does not turn into pneumonia. Patient reports she has picked up rx and will start immediately. Patient advised to call office if she continues to get worse or proceed to ED if she has difficulty breathing or chest pain. Patient verbalized understanding.

## 2018-12-26 ENCOUNTER — Encounter: Payer: Self-pay | Admitting: Family Medicine

## 2018-12-26 DIAGNOSIS — Z20828 Contact with and (suspected) exposure to other viral communicable diseases: Secondary | ICD-10-CM | POA: Diagnosis not present

## 2018-12-26 DIAGNOSIS — I82409 Acute embolism and thrombosis of unspecified deep veins of unspecified lower extremity: Secondary | ICD-10-CM | POA: Diagnosis not present

## 2018-12-26 DIAGNOSIS — B029 Zoster without complications: Secondary | ICD-10-CM | POA: Diagnosis not present

## 2019-01-02 DIAGNOSIS — M25561 Pain in right knee: Secondary | ICD-10-CM | POA: Diagnosis not present

## 2019-01-02 DIAGNOSIS — M65331 Trigger finger, right middle finger: Secondary | ICD-10-CM | POA: Diagnosis not present

## 2019-01-05 ENCOUNTER — Other Ambulatory Visit: Payer: Self-pay | Admitting: Physician Assistant

## 2019-01-05 DIAGNOSIS — I82409 Acute embolism and thrombosis of unspecified deep veins of unspecified lower extremity: Secondary | ICD-10-CM

## 2019-01-18 ENCOUNTER — Ambulatory Visit
Admission: RE | Admit: 2019-01-18 | Discharge: 2019-01-18 | Disposition: A | Discharge status: Home / Self Care | Payer: Medicare Other | Source: Ambulatory Visit | Attending: Physician Assistant | Admitting: Physician Assistant

## 2019-01-18 DIAGNOSIS — M79661 Pain in right lower leg: Secondary | ICD-10-CM | POA: Diagnosis not present

## 2019-01-18 DIAGNOSIS — I82409 Acute embolism and thrombosis of unspecified deep veins of unspecified lower extremity: Secondary | ICD-10-CM

## 2019-02-01 DIAGNOSIS — G8929 Other chronic pain: Secondary | ICD-10-CM | POA: Diagnosis not present

## 2019-02-01 DIAGNOSIS — G894 Chronic pain syndrome: Secondary | ICD-10-CM | POA: Diagnosis not present

## 2019-02-01 DIAGNOSIS — M545 Low back pain: Secondary | ICD-10-CM | POA: Diagnosis not present

## 2019-02-09 DIAGNOSIS — G8929 Other chronic pain: Secondary | ICD-10-CM | POA: Insufficient documentation

## 2019-03-05 ENCOUNTER — Encounter: Payer: Self-pay | Admitting: Family Medicine

## 2019-03-07 DIAGNOSIS — Z1159 Encounter for screening for other viral diseases: Secondary | ICD-10-CM | POA: Diagnosis not present

## 2019-03-07 DIAGNOSIS — L905 Scar conditions and fibrosis of skin: Secondary | ICD-10-CM | POA: Diagnosis not present

## 2019-03-07 DIAGNOSIS — R52 Pain, unspecified: Secondary | ICD-10-CM | POA: Diagnosis not present

## 2019-03-07 DIAGNOSIS — G894 Chronic pain syndrome: Secondary | ICD-10-CM | POA: Diagnosis not present

## 2019-03-23 DIAGNOSIS — L905 Scar conditions and fibrosis of skin: Secondary | ICD-10-CM | POA: Diagnosis not present

## 2019-03-23 DIAGNOSIS — R52 Pain, unspecified: Secondary | ICD-10-CM | POA: Diagnosis not present

## 2019-03-23 DIAGNOSIS — Z4889 Encounter for other specified surgical aftercare: Secondary | ICD-10-CM | POA: Diagnosis not present

## 2019-03-27 ENCOUNTER — Other Ambulatory Visit: Payer: Self-pay

## 2019-03-27 ENCOUNTER — Telehealth (INDEPENDENT_AMBULATORY_CARE_PROVIDER_SITE_OTHER): Payer: Medicare Other | Admitting: Family Medicine

## 2019-03-27 DIAGNOSIS — R600 Localized edema: Secondary | ICD-10-CM

## 2019-03-27 DIAGNOSIS — F411 Generalized anxiety disorder: Secondary | ICD-10-CM | POA: Diagnosis not present

## 2019-03-27 DIAGNOSIS — J309 Allergic rhinitis, unspecified: Secondary | ICD-10-CM

## 2019-03-27 DIAGNOSIS — K120 Recurrent oral aphthae: Secondary | ICD-10-CM | POA: Diagnosis not present

## 2019-03-27 DIAGNOSIS — K219 Gastro-esophageal reflux disease without esophagitis: Secondary | ICD-10-CM

## 2019-03-27 MED ORDER — COLCHICINE 0.6 MG PO TABS: 0.6000 mg | ORAL_TABLET | Freq: Every day | ORAL | 3 refills | Status: DC

## 2019-03-27 MED ORDER — TORSEMIDE 10 MG PO TABS: 10.0000 mg | ORAL_TABLET | Freq: Every day | ORAL | 3 refills | Status: DC

## 2019-03-27 MED ORDER — ALPRAZOLAM 0.5 MG PO TABS: 0.5000 mg | ORAL_TABLET | Freq: Two times a day (BID) | ORAL | 1 refills | Status: DC | PRN

## 2019-03-27 MED ORDER — DEXAMETHASONE 0.5 MG/5ML PO ELIX: ORAL_SOLUTION | ORAL | 1 refills | Status: DC

## 2019-03-27 MED ORDER — ALPRAZOLAM 0.5 MG PO TABS: 0.5000 mg | ORAL_TABLET | Freq: Two times a day (BID) | ORAL | 1 refills | Status: DC

## 2019-03-27 MED ORDER — RABEPRAZOLE SODIUM 20 MG PO TBEC: 20.0000 mg | DELAYED_RELEASE_TABLET | Freq: Every day | ORAL | 3 refills | Status: DC

## 2019-03-27 MED ORDER — POTASSIUM CHLORIDE ER 10 MEQ PO TBCR: EXTENDED_RELEASE_TABLET | ORAL | 3 refills | Status: DC

## 2019-03-27 MED ORDER — MONTELUKAST SODIUM 10 MG PO TABS: ORAL_TABLET | ORAL | 3 refills | Status: DC

## 2019-03-27 NOTE — Addendum Note (Signed)
Addended by: Eulas Post on: 03/27/2019 03:54 PM   Modules accepted: Orders

## 2019-03-27 NOTE — Progress Notes (Signed)
This visit type was conducted due to national recommendations for restrictions regarding the COVID-19 pandemic in an effort to limit this patient's exposure and mitigate transmission in our community.   Virtual Visit via Video Note  I connected with Ashley Castaneda on 03/27/19 at 10:45 AM EDT by a video enabled telemedicine application and verified that I am speaking with the correct person using two identifiers.  Location patient: home Location provider:work or home office Persons participating in the virtual visit: patient, provider  I discussed the limitations of evaluation and management by telemedicine and the availability of in person appointments. The patient expressed understanding and agreed to proceed.   HPI: Patient has multiple chronic problems including obesity, history of trigeminal neuralgia, osteoarthritis, occipital neuralgia, history of migraine headaches, hyperlipidemia, fibromyalgia, GERD, history of DVT, history of recurrent depression, chronic anxiety, perennial and seasonal allergies.  Her main ongoing problem recently has been chronic right buttock pain.  She has had shingles in that region before.  She had recent surgery at [facility] on August 12 for some scar tissue excision.  She is recovering fairly well at this time.  Staples recently removed.  She has Steri-Strips in place.  She has had some chronic bilateral leg edema and suspected venous stasis.  She takes torsemide 10 mg daily and needs refills.  She does take potassium supplement as well.  She states she had recent electrolyte panel through urgent care but we have no record.  She thinks this was done just couple months ago.  Her allergies are fairly well controlled Singulair.  GERD symptoms controlled with once daily AcipHex.  She has had recurrent problems with canker sores in her mouth.  We had used Kenalog and Orabase in the past which helps but she has difficulty applying.  She states she has almost a new sore about every  week.  She did change her toothpaste from sodium lauryl sulfate base to Biotene-type toothpaste recently and hopes that will help   ROS: See pertinent positives and negatives per HPI.  Past Medical History:  Diagnosis Date  . Adenomatous colon polyp 09/2008  . ALLERGIC RHINITIS 07/01/2008  . ANXIETY 07/01/2008  . BACK PAIN, THORACIC REGION 03/04/2010  . BRONCHITIS, CHRONIC 07/01/2008  . CARPAL TUNNEL SYNDROME, HX OF 07/01/2008  . CHRONIC OBSTRUCTIVE PULMONARY DISEASE, ACUTE EXACERBATION 08/07/2010  . DEPRESSION 07/01/2008  . DVT, HX OF 07/01/2008  . ECZEMA 05/29/2009  . FACIAL PAIN 12/25/2009  . FIBROMYALGIA 07/01/2008  . GERD 07/01/2008  . Hiatal hernia   . Hyperlipidemia   . LEG PAIN, BILATERAL 07/01/2008  . MIGRAINES, HX OF 07/01/2008  . OCCIPITAL NEURALGIA 07/01/2008  . OSTEOARTHRITIS 07/01/2008  . Other specified trigeminal nerve disorders 07/01/2008  . REFLEX SYMPATHETIC DYSTROPHY 07/01/2008  . Trigeminal neuralgia 05/21/2009  . Unspecified vitamin D deficiency 03/31/2010    Past Surgical History:  Procedure Laterality Date  . CARPAL TUNNEL RELEASE  2009   RIGHT  . HEAD CRANIECTOMY  1994   MICROVASULAR DECOMPRESSION  . LAVH     BSO  . LEFT KNEE SURGERY  1989  . OCCIPITAL NERVE STIMULATOR INSERTION    . RIGHT FOOT BONE SPUR  1987  . RIGHT FOOT SURGERY  1998   BAD CUT  . RIGHT JAW SURGERY  2002  . RIGHT KNEE SURGERY  1990  . [place]  . RIGHT THUMB SUGERY  2004  . TONSILLECTOMY  1957    Family History  Problem Relation Age of Onset  .  Ovarian cancer Mother 24  . Hypertension Father   . Prostate cancer Father   . Heart attack Father   . Heart attack Maternal Grandmother   . Stroke Paternal Grandmother     SOCIAL HX: Former smoker.  Quit 2013.  She lives at home with her husband.  They have been very isolated.   Current Outpatient Medications:  .  ALPRAZolam (XANAX) 0.5 MG tablet, Take 1 tablet (0.5 mg total) by mouth 2 (two) times  daily as needed for anxiety. Take one tablet qhs prn., Disp: 180 tablet, Rfl: 1 .  aspirin 325 MG tablet, daily., Disp: , Rfl:  .  butalbital-acetaminophen-caffeine (FIORICET, ESGIC) 50-325-40 MG tablet, butalbital-acetaminophen-caffeine 50 mg-325 mg-40 mg tablet  TAKE 1-2 TAB EVERY 8 HRS FOR HEADACHE,PAIN SCALE 0-10,1-3 TAKE TYLENOL ONLY 4-6 1 TAB,7-10 2 TABS, Disp: 30 tablet, Rfl: 0 .  calcium carbonate (OS-CAL - DOSED IN MG OF ELEMENTAL CALCIUM) 1250 MG tablet, Take 1 tablet by mouth daily., Disp: , Rfl:  .  carboxymethylcellulose (REFRESH PLUS) 0.5 % SOLN, Take 1 drop by mouth daily as needed. For dry eyes, Disp: , Rfl:  .  cetirizine (ZYRTEC) 10 MG tablet, Take 10 mg by mouth daily.  , Disp: , Rfl:  .  cycloSPORINE (RESTASIS) 0.05 % ophthalmic emulsion, Place 1 drop into both eyes 2 (two) times daily., Disp: , Rfl:  .  dronabinol (MARINOL) 5 MG capsule, dronabinol 5 mg capsule  TAKE ONE CAPSULE BY MOUTH TWICE A DAY, Disp: , Rfl:  .  fluticasone (FLONASE) 50 MCG/ACT nasal spray, Place 2 sprays into both nostrils daily as needed. For nasal congestion, Disp: 16 g, Rfl: 3 .  frovatriptan (FROVA) 2.5 MG tablet, Take 2.5 mg by mouth as needed. If recurs, may repeat after 2 hours. Max of 3 tabs in 24 hours. For migraines., Disp: , Rfl:  .  hydrOXYzine (ATARAX) 10 MG tablet, Take 10 mg by mouth every 8 (eight) hours as needed. For dizziness per Renaldo Reel, MD, Disp: , Rfl:  .  Influenza Vac Subunit Quad (FLUCELVAX QUADRIVALENT) 0.5 ML SUSY, Flucelvax Quad 2017-2018 (PF) 60 mcg (15 mcg x 4)/0.5 mL IM syringe  TO BE ADMINISTERED BY PHARMACIST FOR IMMUNIZATION, Disp: , Rfl:  .  lidocaine (LIDODERM) 5 %, Place 1 patch onto the skin as needed. Remove & Discard patch within 12 hours or as directed by Renaldo Reel MD , Disp: , Rfl:  .  lidocaine (LIDODERM) 5 %, Place 1 patch onto the skin daily. Remove & Discard patch within 12 hours or as directed by MD, Disp: 30 patch, Rfl: 1 .  meclizine (ANTIVERT) 25 MG  tablet, TAKE 1 TABLET BY MOUTH 4 TIMES A DAY AS NEEDED FOR DIZZINESS, Disp: 120 tablet, Rfl: 1 .  meloxicam (MOBIC) 15 MG tablet, Take 1 tablet (15 mg total) by mouth daily., Disp: 90 tablet, Rfl: 3 .  metaxalone (SKELAXIN) 800 MG tablet, Take 800 mg by mouth 3 (three) times daily.  , Disp: , Rfl:  .  metoclopramide (REGLAN) 10 MG tablet, Take 10 mg by mouth 3 (three) times daily as needed. For nausea, Disp: , Rfl:  .  mometasone (NASONEX) 50 MCG/ACT nasal spray, Place 2 sprays into the nose daily., Disp: 17 g, Rfl: 1 .  montelukast (SINGULAIR) 10 MG tablet, TAKE 1 TABLET (10 MG TOTAL) BY MOUTH DAILY AT 6 PM., Disp: 90 tablet, Rfl: 3 .  Multiple Vitamin (MULTIVITAMIN) tablet, Take 1 tablet by mouth every morning. , Disp: , Rfl:  .  Omega-3 Fatty Acids (FISH OIL) 1200 MG CAPS, Take 1 capsule by mouth every evening., Disp: , Rfl:  .  potassium chloride (K-DUR) 10 MEQ tablet, Take two tablets once daily, Disp: 90 tablet, Rfl: 1 .  RABEprazole (ACIPHEX) 20 MG tablet, Take 1 tablet (20 mg total) by mouth 2 (two) times daily., Disp: 180 tablet, Rfl: 3 .  tiZANidine (ZANAFLEX) 2 MG tablet, Take 2 mg by mouth at bedtime., Disp: , Rfl:  .  torsemide (DEMADEX) 10 MG tablet, Take 1 tablet (10 mg total) by mouth daily., Disp: 90 tablet, Rfl: 3 .  UNABLE TO FIND, Reported on 09/23/2015, Disp: , Rfl:  .  vitamin E 400 UNIT capsule, Take 400 Units by mouth daily at 6 PM. , Disp: , Rfl:   Current Facility-Administered Medications:  .  valproate (DEPACON) 1,500 mg in sodium chloride 0.9 % 100 mL IVPB, 1,500 mg, Intravenous, Continuous, Kathrynn Ducking, MD, Last Rate: 115 mL/hr at 01/02/14 1215, 1,500 mg at 01/02/14 1215  EXAM:  VITALS per patient if applicable:  GENERAL: alert, oriented, appears well and in no acute distress  HEENT: atraumatic, conjunttiva clear, no obvious abnormalities on inspection of external nose and ears  NECK: normal movements of the head and neck  LUNGS: on inspection no signs of  respiratory distress, breathing rate appears normal, no obvious gross SOB, gasping or wheezing  CV: no obvious cyanosis  MS: moves all visible extremities without noticeable abnormality  PSYCH/NEURO: pleasant and cooperative, no obvious depression or anxiety, speech and thought processing grossly intact  ASSESSMENT AND PLAN:  Discussed the following assessment and plan:  #1 chronic bilateral leg edema stable per patient -Refill torsemide for 1 year -Continue potassium supplement -We have asked that she get copy of labs recently from urgent care sent here for our records  #2 seasonal and perennial allergic rhinitis stable on Singulair -Refill Singulair for 1 year  #3 GERD stable on AcipHex -Refill AcipHex for 1 year  #4 history of chronic anxiety -Patient has been on low-dose benzodiazepines for years.  We discussed tapering issue in the past.  She has been very reluctant.  We will go and refill for 6 months  #5 recurrent canker sores -Consider liquid dexamethasone elixir to use as needed -Since she is having these weekly and these are very bothersome we did discuss possible prophylactic options.  We discussed trial of colchicine 0.6 mg daily.  If that does not work we could look at other options such as liquid sucralfate     I discussed the assessment and treatment plan with the patient. The patient was provided an opportunity to ask questions and all were answered. The patient agreed with the plan and demonstrated an understanding of the instructions.   The patient was advised to call back or seek an in-person evaluation if the symptoms worsen or if the condition fails to improve as anticipated.     Carolann Littler, MD

## 2019-03-28 ENCOUNTER — Telehealth: Payer: Self-pay

## 2019-03-28 NOTE — Telephone Encounter (Signed)
[facility] and gave them the Xanax refill information and gave to [place]. Butch Penny verbalized an understanding.

## 2019-03-30 ENCOUNTER — Telehealth: Payer: Self-pay | Admitting: Family Medicine

## 2019-03-30 DIAGNOSIS — D849 Immunodeficiency, unspecified: Secondary | ICD-10-CM | POA: Diagnosis not present

## 2019-03-30 DIAGNOSIS — B029 Zoster without complications: Secondary | ICD-10-CM | POA: Diagnosis not present

## 2019-03-30 NOTE — Telephone Encounter (Signed)
Please see message. °

## 2019-03-30 NOTE — Telephone Encounter (Signed)
Pt has come in the office to drop off some lab work that was from another facility.  Pt stated after she had given the paperwork and it was copied that she had went to UC and was diagnosed with the Shingles for the 5th time.

## 2019-04-09 ENCOUNTER — Ambulatory Visit (INDEPENDENT_AMBULATORY_CARE_PROVIDER_SITE_OTHER): Payer: Medicare Other | Admitting: Internal Medicine

## 2019-04-09 ENCOUNTER — Other Ambulatory Visit: Payer: Self-pay

## 2019-04-09 VITALS — HR 99

## 2019-04-09 DIAGNOSIS — Z23 Encounter for immunization: Secondary | ICD-10-CM | POA: Diagnosis not present

## 2019-04-09 DIAGNOSIS — B0229 Other postherpetic nervous system involvement: Secondary | ICD-10-CM | POA: Diagnosis not present

## 2019-04-09 NOTE — Progress Notes (Signed)
RFV: recurrent shingles Patient ID: Ashley Castaneda, female   DOB: 01-21-1951, 68 y.o.   MRN: [id number]  HPI Right facial nerve shingles years ago - located to right frontal - trigeminal neuralgia  Spinal cord stimulator 06/2017 but rash develped in feb 2019, moved stimulator battery then in June 2020 and reconstructive tissue to remove scar tissue on 8/12 but in early sept 2020 had recurrence in rash- vesicular.  Cannot tolerate gapapentin  Outpatient Encounter Medications as of 04/09/2019  Medication Sig  . ALPRAZolam (XANAX) 0.5 MG tablet Take 1 tablet (0.5 mg total) by mouth 2 (two) times daily.  Marland Kitchen aspirin 325 MG tablet daily.  . baclofen (LIORESAL) 10 MG tablet baclofen 10 mg tablet  Take 1 tablet 4 times a day by oral route as needed.  . butalbital-acetaminophen-caffeine (FIORICET, ESGIC) 50-325-40 MG tablet butalbital-acetaminophen-caffeine 50 mg-325 mg-40 mg tablet  TAKE 1-2 TAB EVERY 8 HRS FOR HEADACHE,PAIN SCALE 0-10,1-3 TAKE TYLENOL ONLY 4-6 1 TAB,7-10 2 TABS  . calcium carbonate (OS-CAL - DOSED IN MG OF ELEMENTAL CALCIUM) 1250 MG tablet Take 1 tablet by mouth daily.  . carboxymethylcellulose (REFRESH PLUS) 0.5 % SOLN Take 1 drop by mouth daily as needed. For dry eyes  . cetirizine (ZYRTEC) 10 MG tablet Take 10 mg by mouth daily.    . cycloSPORINE (RESTASIS) 0.05 % ophthalmic emulsion Place 1 drop into both eyes 2 (two) times daily.  Marland Kitchen dronabinol (MARINOL) 5 MG capsule dronabinol 5 mg capsule  TAKE ONE CAPSULE BY MOUTH TWICE A DAY  . fluticasone (FLONASE) 50 MCG/ACT nasal spray Place 2 sprays into both nostrils daily as needed. For nasal congestion  . frovatriptan (FROVA) 2.5 MG tablet Take 2.5 mg by mouth as needed. If recurs, may repeat after 2 hours. Max of 3 tabs in 24 hours. For migraines.  . Galcanezumab-gnlm 120 MG/ML SOSY Inject into the skin.  . hydrOXYzine (ATARAX) 10 MG tablet Take 10 mg by mouth every 8 (eight) hours as needed. For dizziness per Renaldo Reel, MD  .  lidocaine (LIDODERM) 5 % Place 1 patch onto the skin as needed. Remove & Discard patch within 12 hours or as directed by Renaldo Reel MD   . lidocaine (LIDODERM) 5 % Place 1 patch onto the skin daily. Remove & Discard patch within 12 hours or as directed by MD  . meclizine (ANTIVERT) 25 MG tablet TAKE 1 TABLET BY MOUTH 4 TIMES A DAY AS NEEDED FOR DIZZINESS  . meloxicam (MOBIC) 15 MG tablet Take 1 tablet (15 mg total) by mouth daily.  . metaxalone (SKELAXIN) 800 MG tablet Take 800 mg by mouth 3 (three) times daily.    . mometasone (NASONEX) 50 MCG/ACT nasal spray Place 2 sprays into the nose daily.  . montelukast (SINGULAIR) 10 MG tablet TAKE 1 TABLET (10 MG TOTAL) BY MOUTH DAILY AT 6 PM.  . Multiple Vitamin (MULTIVITAMIN) tablet Take 1 tablet by mouth every morning.   . Omega-3 Fatty Acids (FISH OIL) 1200 MG CAPS Take 1 capsule by mouth every evening.  . potassium chloride (K-DUR) 10 MEQ tablet Take two tablets once daily  . RABEprazole (ACIPHEX) 20 MG tablet Take 1 tablet (20 mg total) by mouth daily.  Marland Kitchen tiZANidine (ZANAFLEX) 2 MG tablet Take 2 mg by mouth at bedtime.  . torsemide (DEMADEX) 10 MG tablet Take 1 tablet (10 mg total) by mouth daily.  . valACYclovir (VALTREX) 1000 MG tablet TK 1 T PO TID FOR 14 DAYS  . vitamin E 400  UNIT capsule Take 400 Units by mouth daily at 6 PM.   . colchicine 0.6 MG tablet Take 1 tablet (0.6 mg total) by mouth daily. (Patient not taking: Reported on 04/09/2019)  . dexamethasone 0.5 MG/5ML elixir Take two teaspoons and swish, gargle, and spit qid prn canker sores. (Patient not taking: Reported on 04/09/2019)  . metoclopramide (REGLAN) 10 MG tablet Take 10 mg by mouth 3 (three) times daily as needed. For nausea  . [DISCONTINUED] Influenza Vac Subunit Quad (FLUCELVAX QUADRIVALENT) 0.5 ML SUSY Flucelvax Quad 2017-2018 (PF) 60 mcg (15 mcg x 4)/0.5 mL IM syringe  TO BE ADMINISTERED BY PHARMACIST FOR IMMUNIZATION  . [DISCONTINUED] UNABLE TO FIND Reported on 09/23/2015  .  [DISCONTINUED] valproate (DEPACON) 1,500 mg in sodium chloride 0.9 % 100 mL IVPB    No facility-administered encounter medications on file as of 04/09/2019.      Patient Active Problem List   Diagnosis Date Noted  . Recurrent aphthous ulcer 03/27/2019  . Bilateral leg edema 09/28/2017  . Obesity 10/08/2015  . Migraine headache 01/18/2014  . Dizziness and giddiness 01/18/2014  . Numbness and tingling of both legs 01/18/2014  . Severe obesity (BMI >= 40) ([place]) 06/14/2013  . Numbness and tingling in right hand 05/30/2013  . Hyperlipidemia 06/06/2012  . Fatigue 06/06/2012  . Dvt femoral (deep venous thrombosis) ([place]) 10/28/2011  . CHRONIC OBSTRUCTIVE PULMONARY DISEASE, ACUTE EXACERBATION 08/07/2010  . UNSPECIFIED VITAMIN D DEFICIENCY 03/31/2010  . BACK PAIN, THORACIC REGION 03/04/2010  . ECZEMA 05/29/2009  . TRIGEMINAL NEURALGIA 05/21/2009  . Anxiety state 07/01/2008  . DEPRESSION 07/01/2008  . REFLEX SYMPATHETIC DYSTROPHY 07/01/2008  . OTHER SPECIFIED TRIGEMINAL NERVE DISORDERS 07/01/2008  . Allergic rhinitis 07/01/2008  . BRONCHITIS, CHRONIC 07/01/2008  . GERD 07/01/2008  . OSTEOARTHRITIS 07/01/2008  . OCCIPITAL NEURALGIA 07/01/2008  . FIBROMYALGIA 07/01/2008  . CARPAL TUNNEL SYNDROME, HX OF 07/01/2008  . MIGRAINES, HX OF 07/01/2008     Health Maintenance Due  Topic Date Due  . Hepatitis C Screening  02-Mar-1951  . MAMMOGRAM  04/19/2018  . INFLUENZA VACCINE  02/24/2019    Social History   Tobacco Use  . Smoking status: Former Smoker    Packs/day: 2.00    Years: 30.00    Pack years: 60.00    Types: Cigarettes    Quit date: 02/04/2012    Years since quitting: 7.1  . Smokeless tobacco: Current User  . Tobacco comment: nicotine in vapor and is very low nicotine   Substance Use Topics  . Alcohol use: No    Alcohol/week: 0.0 standard drinks    Comment: quit 2000  . Drug use: No  family history includes Heart attack in her father and maternal grandmother; Hypertension  in her father; Ovarian cancer (age of onset: 43) in her mother; Prostate cancer in her father; Stroke in her paternal grandmother. Review of Systems 12 point ros is negative except what is mentioned above Physical Exam   Pulse 99   LMP 07/26/1992   SpO2 100%    Physical Exam  Constitutional:  oriented to person, place, and time. appears well-developed and well-nourished. No distress.  HENT: [place]/AT, PERRLA, no scleral icterus Mouth/Throat: Oropharynx is clear and moist. No oropharyngeal exudate.  Neurological: alert and oriented to person, place, and time.  Skin: Skin is warm and dry. No rash noted. No erythema.  Psychiatric: a normal mood and affect.  behavior is normal.   .CBC Lab Results  Component Value Date   WBC 5.6 04/13/2018  RBC 4.25 04/13/2018   HGB 12.7 04/13/2018   HCT 38.3 04/13/2018   PLT 316.0 04/13/2018   MCV 90.0 04/13/2018   MCH 30.0 08/17/2017   MCHC 33.3 04/13/2018   RDW 13.3 04/13/2018   LYMPHSABS 1.5 04/13/2018   MONOABS 0.8 04/13/2018   EOSABS 0.2 04/13/2018    BMET Lab Results  Component Value Date   NA 139 02/24/2018   K 4.4 02/24/2018   CL 101 02/24/2018   CO2 29 02/24/2018   GLUCOSE 94 02/24/2018   BUN 24 02/24/2018   CREATININE 1.03 (H) 02/24/2018   CALCIUM 9.0 02/24/2018   GFRNONAA >60 08/17/2017   GFRAA >60 08/17/2017      Assessment and Plan  Shingles with sequelae of neuropathy=  Consider to use capsacin cream for the next 4 wk alternatively can also use lidoderm patch  Finish up Valtrex x 14d.  She does not need to use chronic antiviral at this stage, but appears needs more post herpetic neuralgia management   Health maintenance = will give flu shot

## 2019-04-23 ENCOUNTER — Encounter: Payer: Medicare Other | Admitting: Family Medicine

## 2019-05-02 ENCOUNTER — Encounter: Payer: Self-pay | Admitting: Family Medicine

## 2019-05-06 ENCOUNTER — Other Ambulatory Visit: Payer: Self-pay

## 2019-05-06 ENCOUNTER — Emergency Department (HOSPITAL_COMMUNITY): Payer: Medicare Other

## 2019-05-06 ENCOUNTER — Emergency Department (HOSPITAL_COMMUNITY)
Admission: EM | Admit: 2019-05-06 | Discharge: 2019-05-06 | Disposition: A | Discharge status: Home / Self Care | Payer: Medicare Other | Attending: Emergency Medicine | Admitting: Emergency Medicine

## 2019-05-06 ENCOUNTER — Encounter (HOSPITAL_COMMUNITY): Payer: Self-pay | Admitting: *Deleted

## 2019-05-06 DIAGNOSIS — R0789 Other chest pain: Secondary | ICD-10-CM | POA: Diagnosis not present

## 2019-05-06 DIAGNOSIS — R0602 Shortness of breath: Secondary | ICD-10-CM | POA: Diagnosis not present

## 2019-05-06 DIAGNOSIS — J449 Chronic obstructive pulmonary disease, unspecified: Secondary | ICD-10-CM | POA: Insufficient documentation

## 2019-05-06 DIAGNOSIS — Z7982 Long term (current) use of aspirin: Secondary | ICD-10-CM | POA: Insufficient documentation

## 2019-05-06 DIAGNOSIS — Z20828 Contact with and (suspected) exposure to other viral communicable diseases: Secondary | ICD-10-CM | POA: Diagnosis not present

## 2019-05-06 DIAGNOSIS — R079 Chest pain, unspecified: Secondary | ICD-10-CM

## 2019-05-06 DIAGNOSIS — Z79899 Other long term (current) drug therapy: Secondary | ICD-10-CM | POA: Insufficient documentation

## 2019-05-06 DIAGNOSIS — R202 Paresthesia of skin: Secondary | ICD-10-CM | POA: Insufficient documentation

## 2019-05-06 DIAGNOSIS — N289 Disorder of kidney and ureter, unspecified: Secondary | ICD-10-CM | POA: Insufficient documentation

## 2019-05-06 DIAGNOSIS — Z87891 Personal history of nicotine dependence: Secondary | ICD-10-CM | POA: Insufficient documentation

## 2019-05-06 LAB — BASIC METABOLIC PANEL
Anion gap: 12 (ref 5–15)
BUN: 25 mg/dL — ABNORMAL HIGH (ref 8–23)
CO2: 19 mmol/L — ABNORMAL LOW (ref 22–32)
Calcium: 9 mg/dL (ref 8.9–10.3)
Chloride: 106 mmol/L (ref 98–111)
Creatinine, Ser: 1.27 mg/dL — ABNORMAL HIGH (ref 0.44–1.00)
GFR calc Af Amer: 51 mL/min — ABNORMAL LOW (ref >60)
GFR calc non Af Amer: 44 mL/min — ABNORMAL LOW (ref >60)
Glucose, Bld: 122 mg/dL — ABNORMAL HIGH (ref 70–99)
Potassium: 3.9 mmol/L (ref 3.5–5.1)
Sodium: 137 mmol/L (ref 135–145)

## 2019-05-06 LAB — CBC
HCT: 41.9 % (ref 36.0–46.0)
Hemoglobin: 13.4 g/dL (ref 12.0–15.0)
MCH: 31 pg (ref 26.0–34.0)
MCHC: 32 g/dL (ref 30.0–36.0)
MCV: 97 fL (ref 80.0–100.0)
Platelets: 320 10*3/uL (ref 150–400)
RBC: 4.32 MIL/uL (ref 3.87–5.11)
RDW: 13.9 % (ref 11.5–15.5)
WBC: 7.7 10*3/uL (ref 4.0–10.5)
nRBC: 0 % (ref 0.0–0.2)

## 2019-05-06 LAB — TROPONIN I (HIGH SENSITIVITY)
Troponin I (High Sensitivity): 4 ng/L (ref <18)
Troponin I (High Sensitivity): 3 ng/L (ref <18)

## 2019-05-06 LAB — D-DIMER, QUANTITATIVE: D-Dimer, Quant: 0.48 ug/mL-FEU (ref 0.00–0.50)

## 2019-05-06 MED ORDER — PROMETHAZINE HCL 25 MG PO TABS: 25.0000 mg | ORAL_TABLET | Freq: Three times a day (TID) | ORAL | 0 refills | Status: DC | PRN

## 2019-05-06 MED ORDER — PROMETHAZINE HCL 25 MG/ML IJ SOLN
12.5000 mg | Freq: Once | INTRAMUSCULAR | Status: AC
  Administered 2019-05-06: 12.5 mg via INTRAMUSCULAR

## 2019-05-06 MED ORDER — SODIUM CHLORIDE 0.9% FLUSH: 3.0000 mL | Freq: Once | INTRAVENOUS | Status: DC

## 2019-05-06 MED ORDER — PROMETHAZINE HCL 25 MG/ML IJ SOLN
12.5000 mg | Freq: Once | INTRAMUSCULAR | Status: DC
  Filled 2019-05-06: qty 1

## 2019-05-06 NOTE — Discharge Instructions (Signed)
Stop your new medicines.  Follow-up with your headache doctor and your primary care doctor.  Your creatinine was mildly elevated today

## 2019-05-06 NOTE — ED Triage Notes (Signed)
Pt reporting onset of Chest Pain on Friday, "burning" "comes and goes". Tinglings sensation all over. Shortness of breath that started today. Pt reporting that she has chronic headaches and was started on new medications last week.

## 2019-05-06 NOTE — ED Provider Notes (Signed)
[facility] EMERGENCY DEPARTMENT Provider Note   CSN: [id number] Arrival date & time: 05/06/19  1520     History   Chief Complaint Chief Complaint  Patient presents with  . Chest Pain    HPI Ashley Castaneda is a 68 y.o. female.     HPI Patient presents with chest pain and shortness of breath.  Began around 2 days ago.  Burning comes and goes.  States she has been more short of breath and more fatigued.  Has had flareup of her chronic headaches, occipital neuralgia, trigeminal neuralgia.  States that she was started on new medicines by her doctor.  Among these is naratriptan, acetazolamide, nabumetone..  Chest pain is somewhat dull.  Comes on somewhat with exertion.  States her heart is the only part of her the does not have any problems.  No cough.  States that she has had stress test previously.  States she has had a clot in her right leg in the past.  Is on aspirin at this time.  Legs are now more swollen.  She has had nausea.  Continues to have a headache.  She has been on Frova which is also a triptan in the past and states that does not normally give her chest pain. Past Medical History:  Diagnosis Date  . Adenomatous colon polyp 09/2008  . ALLERGIC RHINITIS 07/01/2008  . ANXIETY 07/01/2008  . BACK PAIN, THORACIC REGION 03/04/2010  . BRONCHITIS, CHRONIC 07/01/2008  . CARPAL TUNNEL SYNDROME, HX OF 07/01/2008  . CHRONIC OBSTRUCTIVE PULMONARY DISEASE, ACUTE EXACERBATION 08/07/2010  . DEPRESSION 07/01/2008  . DVT, HX OF 07/01/2008  . ECZEMA 05/29/2009  . FACIAL PAIN 12/25/2009  . FIBROMYALGIA 07/01/2008  . GERD 07/01/2008  . Hiatal hernia   . Hyperlipidemia   . LEG PAIN, BILATERAL 07/01/2008  . MIGRAINES, HX OF 07/01/2008  . OCCIPITAL NEURALGIA 07/01/2008  . OSTEOARTHRITIS 07/01/2008  . Other specified trigeminal nerve disorders 07/01/2008  . REFLEX SYMPATHETIC DYSTROPHY 07/01/2008  . Trigeminal neuralgia 05/21/2009  . Unspecified vitamin D deficiency 03/31/2010     Patient Active Problem List   Diagnosis Date Noted  . Recurrent aphthous ulcer 03/27/2019  . Bilateral leg edema 09/28/2017  . Obesity 10/08/2015  . Migraine headache 01/18/2014  . Dizziness and giddiness 01/18/2014  . Numbness and tingling of both legs 01/18/2014  . Severe obesity (BMI >= 40) ([place]) 06/14/2013  . Numbness and tingling in right hand 05/30/2013  . Hyperlipidemia 06/06/2012  . Fatigue 06/06/2012  . Dvt femoral (deep venous thrombosis) ([place]) 10/28/2011  . CHRONIC OBSTRUCTIVE PULMONARY DISEASE, ACUTE EXACERBATION 08/07/2010  . UNSPECIFIED VITAMIN D DEFICIENCY 03/31/2010  . BACK PAIN, THORACIC REGION 03/04/2010  . ECZEMA 05/29/2009  . TRIGEMINAL NEURALGIA 05/21/2009  . Anxiety state 07/01/2008  . DEPRESSION 07/01/2008  . REFLEX SYMPATHETIC DYSTROPHY 07/01/2008  . OTHER SPECIFIED TRIGEMINAL NERVE DISORDERS 07/01/2008  . Allergic rhinitis 07/01/2008  . BRONCHITIS, CHRONIC 07/01/2008  . GERD 07/01/2008  . OSTEOARTHRITIS 07/01/2008  . OCCIPITAL NEURALGIA 07/01/2008  . FIBROMYALGIA 07/01/2008  . CARPAL TUNNEL SYNDROME, HX OF 07/01/2008  . MIGRAINES, HX OF 07/01/2008    Past Surgical History:  Procedure Laterality Date  . CARPAL TUNNEL RELEASE  2009   RIGHT  . HEAD CRANIECTOMY  1994   MICROVASULAR DECOMPRESSION  . LAVH     BSO  . LEFT KNEE SURGERY  1989  . OCCIPITAL NERVE STIMULATOR INSERTION    . RIGHT FOOT BONE SPUR  1987  . RIGHT FOOT SURGERY  1998   BAD CUT  . RIGHT JAW SURGERY  2002  . RIGHT KNEE SURGERY  1990  . [place]  . RIGHT THUMB SUGERY  2004  . TONSILLECTOMY  1957     OB History    Gravida  0   Para      Term      Preterm      AB      Living        SAB      TAB      Ectopic      Multiple      Live Births               Home Medications    Prior to Admission medications   Medication Sig Start Date End Date Taking? Authorizing Provider  ALPRAZolam Duanne Moron) 0.5 MG tablet Take 1  tablet (0.5 mg total) by mouth 2 (two) times daily. 03/27/19   Burchette, Alinda Sierras, MD  aspirin 325 MG tablet daily.    [provider]  baclofen (LIORESAL) 10 MG tablet baclofen 10 mg tablet  Take 1 tablet 4 times a day by oral route as needed.    [provider]  butalbital-acetaminophen-caffeine (FIORICET, ESGIC) 50-325-40 MG tablet butalbital-acetaminophen-caffeine 50 mg-325 mg-40 mg tablet  TAKE 1-2 TAB EVERY 8 HRS FOR HEADACHE,PAIN SCALE 0-10,1-3 TAKE TYLENOL ONLY 4-6 1 TAB,7-10 2 TABS 09/06/18   Burchette, Alinda Sierras, MD  calcium carbonate (OS-CAL - DOSED IN MG OF ELEMENTAL CALCIUM) 1250 MG tablet Take 1 tablet by mouth daily.    [provider]  carboxymethylcellulose (REFRESH PLUS) 0.5 % SOLN Take 1 drop by mouth daily as needed. For dry eyes    [provider]  cetirizine (ZYRTEC) 10 MG tablet Take 10 mg by mouth daily.      [provider]  colchicine 0.6 MG tablet Take 1 tablet (0.6 mg total) by mouth daily. Patient not taking: Reported on 04/09/2019 03/27/19   Eulas Post, MD  cycloSPORINE (RESTASIS) 0.05 % ophthalmic emulsion Place 1 drop into both eyes 2 (two) times daily.    [provider]  dexamethasone 0.5 MG/5ML elixir Take two teaspoons and swish, gargle, and spit qid prn canker sores. Patient not taking: Reported on 04/09/2019 03/27/19   Eulas Post, MD  dronabinol (MARINOL) 5 MG capsule dronabinol 5 mg capsule  TAKE ONE CAPSULE BY MOUTH TWICE A DAY    [provider]  fluticasone (FLONASE) 50 MCG/ACT nasal spray Place 2 sprays into both nostrils daily as needed. For nasal congestion 04/25/18 04/01/20  Burchette, Alinda Sierras, MD  frovatriptan (FROVA) 2.5 MG tablet Take 2.5 mg by mouth as needed. If recurs, may repeat after 2 hours. Max of 3 tabs in 24 hours. For migraines.    [provider]  Galcanezumab-gnlm 120 MG/ML SOSY Inject into the skin.    [provider]  hydrOXYzine (ATARAX) 10 MG tablet  Take 10 mg by mouth every 8 (eight) hours as needed. For dizziness per Renaldo Reel, MD    [provider]  lidocaine (LIDODERM) 5 % Place 1 patch onto the skin as needed. Remove & Discard patch within 12 hours or as directed by Renaldo Reel MD     [provider]  lidocaine (LIDODERM) 5 % Place 1 patch onto the skin daily. Remove & Discard patch within 12 hours or as directed by MD 04/12/18   Eulas Post, MD  meclizine (ANTIVERT) 25 MG tablet TAKE 1 TABLET BY MOUTH 4 TIMES A DAY AS NEEDED FOR DIZZINESS 04/17/14   Kathrynn Ducking, MD  meloxicam (MOBIC) 15 MG tablet Take 1 tablet (15 mg total) by mouth daily. 04/25/18   Burchette, Alinda Sierras, MD  metaxalone (SKELAXIN) 800 MG tablet Take 800 mg by mouth 3 (three) times daily.      [provider]  metoclopramide (REGLAN) 10 MG tablet Take 10 mg by mouth 3 (three) times daily as needed. For nausea    [provider]  mometasone (NASONEX) 50 MCG/ACT nasal spray Place 2 sprays into the nose daily. 04/28/18   Burchette, Alinda Sierras, MD  montelukast (SINGULAIR) 10 MG tablet TAKE 1 TABLET (10 MG TOTAL) BY MOUTH DAILY AT 6 PM. 03/27/19   Burchette, Alinda Sierras, MD  Multiple Vitamin (MULTIVITAMIN) tablet Take 1 tablet by mouth every morning.     [provider]  Omega-3 Fatty Acids (FISH OIL) 1200 MG CAPS Take 1 capsule by mouth every evening.    [provider]  potassium chloride (K-DUR) 10 MEQ tablet Take two tablets once daily 03/27/19   Burchette, Alinda Sierras, MD  promethazine (PHENERGAN) 25 MG tablet Take 1 tablet (25 mg total) by mouth every 8 (eight) hours as needed for nausea. 05/06/19   Davonna Belling, MD  RABEprazole (ACIPHEX) 20 MG tablet Take 1 tablet (20 mg total) by mouth daily. 03/27/19   Burchette, Alinda Sierras, MD  tiZANidine (ZANAFLEX) 2 MG tablet Take 2 mg by mouth at bedtime.    [provider]  torsemide (DEMADEX) 10 MG tablet Take 1 tablet (10 mg total) by mouth daily. 03/27/19   Burchette, Alinda Sierras,  MD  valACYclovir (VALTREX) 1000 MG tablet TK 1 T PO TID FOR 14 DAYS 03/30/19   [provider]  vitamin E 400 UNIT capsule Take 400 Units by mouth daily at 6 PM.     [provider]    Family History Family History  Problem Relation Age of Onset  . Ovarian cancer Mother 65  . Hypertension Father   . Prostate cancer Father   . Heart attack Father   . Heart attack Maternal Grandmother   . Stroke Paternal Grandmother     Social History Social History   Tobacco Use  . Smoking status: Former Smoker    Packs/day: 2.00    Years: 30.00    Pack years: 60.00    Types: Cigarettes    Quit date: 02/04/2012    Years since quitting: 7.2  . Smokeless tobacco: Current User  . Tobacco comment: nicotine in vapor and is very low nicotine   Substance Use Topics  . Alcohol use: No    Alcohol/week: 0.0 standard drinks    Comment: quit 2000  . Drug use: No     Allergies   Cefazolin, Baclofen, Carbamazepine, Duloxetine, Fentanyl, Gabapentin, Naproxen, Oxcarbazepine, Pregabalin, Serotonin reuptake inhibitors (ssris), Topiramate, Venlafaxine, Zonisamide, Ceclor [cefaclor], Milnacipran, and Ondansetron   Review of Systems Review of Systems  Constitutional: Negative for fatigue and fever.  HENT: Negative for congestion.   Eyes: Negative for visual disturbance.  Respiratory: Positive for shortness of breath.   Cardiovascular: Positive for chest pain and leg swelling.  Gastrointestinal: Negative for abdominal pain.  Genitourinary: Negative for flank pain.  Musculoskeletal: Negative for back pain.  Skin: Negative for rash.  Neurological: Positive for headaches.  Psychiatric/Behavioral: Negative for confusion.     Physical Exam Updated Vital Signs BP (!) 130/103 (BP Location:  Left Arm)   Pulse 88   Temp 97.8 F (36.6 C) (Oral)   Resp 20   LMP 07/26/1992   SpO2 98%   Physical Exam   ED Treatments / Results  Labs (all labs ordered are listed, but only abnormal  results are displayed) Labs Reviewed  BASIC METABOLIC PANEL - Abnormal; Notable for the following components:      Result Value   CO2 19 (*)    Glucose, Bld 122 (*)    BUN 25 (*)    Creatinine, Ser 1.27 (*)    GFR calc non Af Amer 44 (*)    GFR calc Af Amer 51 (*)    All other components within normal limits  NOVEL CORONAVIRUS, NAA (HOSP ORDER, SEND-OUT TO REF LAB; TAT 18-24 HRS)  CBC  D-DIMER, QUANTITATIVE (NOT AT [facility])  TROPONIN I (HIGH SENSITIVITY)  TROPONIN I (HIGH SENSITIVITY)    EKG EKG Interpretation  Date/Time:  Sunday May 06 2019 15:27:30 EDT Ventricular Rate:  80 PR Interval:  138 QRS Duration: 88 QT Interval:  350 QTC Calculation: 403 R Axis:   63 Text Interpretation:  Normal sinus rhythm Low voltage QRS Borderline ECG No significant change since last tracing Confirmed by Davonna Belling [phone]) on 05/06/2019 4:11:57 PM   Radiology Dg Chest 2 View  Result Date: 05/06/2019 CLINICAL DATA:  Pt began having a bad headache on Friday. She began taking a new medication that day. Chest pain, sob, and tingling in extremities started Friday. Ex-smoker. No heart or lung surgeries. No hx of heart or lung problems. EXAM: CHEST - 2 VIEW COMPARISON:  Chest radiograph 07/15/2009 FINDINGS: The heart size and mediastinal contours are within normal limits. Minimal linear opacities at the left lung base likely reflect atelectasis or scarring. No focal infiltrate to suggest infection. No evidence of pulmonary edema. No pneumothorax or pleural effusion. Multilevel degenerative changes in the thoracic spine. IMPRESSION: Minimal left basilar atelectasis or scarring. No other acute finding. Electronically Signed   By: Audie Pinto M.D.   On: 05/06/2019 17:14    Procedures Procedures (including critical care time)  Medications Ordered in ED Medications  sodium chloride flush (NS) 0.9 % injection 3 mL (has no administration in time range)  promethazine (PHENERGAN) injection  12.5 mg (12.5 mg Intramuscular Given 05/06/19 1759)     Initial Impression / Assessment and Plan / ED Course  I have reviewed the triage vital signs and the nursing notes.  Pertinent labs & imaging results that were available during my care of the patient were reviewed by me and considered in my medical decision making (see chart for details).        Patient with chest pain.  Shortness of breath.  Comes and goes.  Not exertional.  Began after starting new medications for her headache.  1 of them is a triptan which can sometimes have cardiac effects.  EKG reassuring.  Troponin negative x2.  D-dimer done due to previous DVT and also negative.  Has a mild renal insufficiency.  Also has paresthesias in hands and some dizziness.  All the symptoms started after starting the medicines.  Will stop those medicines and have her follow-up with her PCP.  Final Clinical Impressions(s) / ED Diagnoses   Final diagnoses:  Nonspecific chest pain  Paresthesias  Renal insufficiency    ED Discharge Orders         Ordered    promethazine (PHENERGAN) 25 MG tablet  Every 8 hours PRN  05/06/19 Magdalene Molly, MD 05/06/19 [phone]

## 2019-05-06 NOTE — ED Notes (Signed)
Patient verbalizes understanding of discharge instructions. Opportunity for questioning and answers were provided. pt discharged from ED with husband.   

## 2019-05-07 ENCOUNTER — Encounter: Payer: Self-pay | Admitting: Family Medicine

## 2019-05-07 MED ORDER — MELOXICAM 15 MG PO TABS: 15.0000 mg | ORAL_TABLET | Freq: Every day | ORAL | 3 refills | Status: DC

## 2019-05-07 NOTE — Telephone Encounter (Signed)
I sent in the Meloxicam.  I did not see the Rapaflo on her med list.   We do not know the dose.  She should contact her urologist for that - or at least would have to get dose for [place] to refill.

## 2019-05-08 ENCOUNTER — Telehealth: Payer: Self-pay

## 2019-05-08 LAB — NOVEL CORONAVIRUS, NAA (HOSP ORDER, SEND-OUT TO REF LAB; TAT 18-24 HRS): SARS-CoV-2, NAA: NOT DETECTED

## 2019-05-08 MED ORDER — SILODOSIN 8 MG PO CAPS: 8.0000 mg | ORAL_CAPSULE | Freq: Every day | ORAL | 3 refills | Status: DC

## 2019-05-08 NOTE — Telephone Encounter (Signed)
Created in error

## 2019-05-08 NOTE — Addendum Note (Signed)
Addended by: Eulas Post on: 05/08/2019 04:56 PM   Modules accepted: Orders

## 2019-05-09 ENCOUNTER — Telehealth (INDEPENDENT_AMBULATORY_CARE_PROVIDER_SITE_OTHER): Payer: Medicare Other | Admitting: Family Medicine

## 2019-05-09 ENCOUNTER — Other Ambulatory Visit: Payer: Self-pay

## 2019-05-09 DIAGNOSIS — R0789 Other chest pain: Secondary | ICD-10-CM | POA: Diagnosis not present

## 2019-05-09 NOTE — Progress Notes (Signed)
This visit type was conducted due to national recommendations for restrictions regarding the COVID-19 pandemic in an effort to limit this patient's exposure and mitigate transmission in our community.   Virtual Visit via Video Note  I connected with Ashley Castaneda on 05/09/19 at  4:00 PM EDT by a video enabled telemedicine application and verified that I am speaking with the correct person using two identifiers.  Location patient: home Location provider:work or home office Persons participating in the virtual visit: patient, provider  I discussed the limitations of evaluation and management by telemedicine and the availability of in person appointments. The patient expressed understanding and agreed to proceed.   HPI:  Recent ER visit for chest pain.  This occurred on Sunday.  Symptoms occurred at rest.  She had somewhat atypical symptoms of burning in the chest but did have some dyspnea and increased fatigue.  She has long history of complicated headaches and was started on a regimen of Pepcid, acetazolamide, nabumetone, and naratriptan per neurologist.  She thinks this may have been related to medications as this started right after starting that combination.  She is also on Emgality injections monthly which were started in July.  She has not had any recent exertional chest pain.  She has had no chest pain since Sunday.  EKG no acute changes.  Troponins were negative.  D-dimer negative.  Covid testing negative.  Labs revealed mild bump in creatinine but she feels she was slightly dehydrated.  She has had no pain whatsoever since ER evaluation.  She had nuclear medicine stress test evaluation back in 2014 which was unremarkable.   ROS: See pertinent positives and negatives per HPI.  Past Medical History:  Diagnosis Date  . Adenomatous colon polyp 09/2008  . ALLERGIC RHINITIS 07/01/2008  . ANXIETY 07/01/2008  . BACK PAIN, THORACIC REGION 03/04/2010  . BRONCHITIS, CHRONIC 07/01/2008  . CARPAL  TUNNEL SYNDROME, HX OF 07/01/2008  . CHRONIC OBSTRUCTIVE PULMONARY DISEASE, ACUTE EXACERBATION 08/07/2010  . DEPRESSION 07/01/2008  . DVT, HX OF 07/01/2008  . ECZEMA 05/29/2009  . FACIAL PAIN 12/25/2009  . FIBROMYALGIA 07/01/2008  . GERD 07/01/2008  . Hiatal hernia   . Hyperlipidemia   . LEG PAIN, BILATERAL 07/01/2008  . MIGRAINES, HX OF 07/01/2008  . OCCIPITAL NEURALGIA 07/01/2008  . OSTEOARTHRITIS 07/01/2008  . Other specified trigeminal nerve disorders 07/01/2008  . REFLEX SYMPATHETIC DYSTROPHY 07/01/2008  . Trigeminal neuralgia 05/21/2009  . Unspecified vitamin D deficiency 03/31/2010    Past Surgical History:  Procedure Laterality Date  . CARPAL TUNNEL RELEASE  2009   RIGHT  . HEAD CRANIECTOMY  1994   MICROVASULAR DECOMPRESSION  . LAVH     BSO  . LEFT KNEE SURGERY  1989  . OCCIPITAL NERVE STIMULATOR INSERTION    . RIGHT FOOT BONE SPUR  1987  . RIGHT FOOT SURGERY  1998   BAD CUT  . RIGHT JAW SURGERY  2002  . RIGHT KNEE SURGERY  1990  . [place]  . RIGHT THUMB SUGERY  2004  . TONSILLECTOMY  1957    Family History  Problem Relation Age of Onset  . Ovarian cancer Mother 33  . Hypertension Father   . Prostate cancer Father   . Heart attack Father   . Heart attack Maternal Grandmother   . Stroke Paternal Grandmother     SOCIAL HX: quit smoking 2013.   Current Outpatient Medications:  .  ALPRAZolam (XANAX) 0.5 MG tablet, Take 1 tablet (  0.5 mg total) by mouth 2 (two) times daily., Disp: 180 tablet, Rfl: 1 .  aspirin 325 MG tablet, daily., Disp: , Rfl:  .  baclofen (LIORESAL) 10 MG tablet, baclofen 10 mg tablet  Take 1 tablet 4 times a day by oral route as needed., Disp: , Rfl:  .  butalbital-acetaminophen-caffeine (FIORICET, ESGIC) 50-325-40 MG tablet, butalbital-acetaminophen-caffeine 50 mg-325 mg-40 mg tablet  TAKE 1-2 TAB EVERY 8 HRS FOR HEADACHE,PAIN SCALE 0-10,1-3 TAKE TYLENOL ONLY 4-6 1 TAB,7-10 2 TABS, Disp: 30 tablet, Rfl: 0 .   calcium carbonate (OS-CAL - DOSED IN MG OF ELEMENTAL CALCIUM) 1250 MG tablet, Take 1 tablet by mouth daily., Disp: , Rfl:  .  carboxymethylcellulose (REFRESH PLUS) 0.5 % SOLN, Take 1 drop by mouth daily as needed. For dry eyes, Disp: , Rfl:  .  cetirizine (ZYRTEC) 10 MG tablet, Take 10 mg by mouth daily.  , Disp: , Rfl:  .  colchicine 0.6 MG tablet, Take 1 tablet (0.6 mg total) by mouth daily. (Patient not taking: Reported on 04/09/2019), Disp: 90 tablet, Rfl: 3 .  cycloSPORINE (RESTASIS) 0.05 % ophthalmic emulsion, Place 1 drop into both eyes 2 (two) times daily., Disp: , Rfl:  .  dexamethasone 0.5 MG/5ML elixir, Take two teaspoons and swish, gargle, and spit qid prn canker sores. (Patient not taking: Reported on 04/09/2019), Disp: 237 mL, Rfl: 1 .  dronabinol (MARINOL) 5 MG capsule, dronabinol 5 mg capsule  TAKE ONE CAPSULE BY MOUTH TWICE A DAY, Disp: , Rfl:  .  fluticasone (FLONASE) 50 MCG/ACT nasal spray, Place 2 sprays into both nostrils daily as needed. For nasal congestion, Disp: 16 g, Rfl: 3 .  frovatriptan (FROVA) 2.5 MG tablet, Take 2.5 mg by mouth as needed. If recurs, may repeat after 2 hours. Max of 3 tabs in 24 hours. For migraines., Disp: , Rfl:  .  Galcanezumab-gnlm 120 MG/ML SOSY, Inject into the skin., Disp: , Rfl:  .  hydrOXYzine (ATARAX) 10 MG tablet, Take 10 mg by mouth every 8 (eight) hours as needed. For dizziness per Renaldo Reel, MD, Disp: , Rfl:  .  lidocaine (LIDODERM) 5 %, Place 1 patch onto the skin as needed. Remove & Discard patch within 12 hours or as directed by Renaldo Reel MD , Disp: , Rfl:  .  lidocaine (LIDODERM) 5 %, Place 1 patch onto the skin daily. Remove & Discard patch within 12 hours or as directed by MD, Disp: 30 patch, Rfl: 1 .  meclizine (ANTIVERT) 25 MG tablet, TAKE 1 TABLET BY MOUTH 4 TIMES A DAY AS NEEDED FOR DIZZINESS, Disp: 120 tablet, Rfl: 1 .  meloxicam (MOBIC) 15 MG tablet, Take 1 tablet (15 mg total) by mouth daily., Disp: 90 tablet, Rfl: 3 .   metaxalone (SKELAXIN) 800 MG tablet, Take 800 mg by mouth 3 (three) times daily.  , Disp: , Rfl:  .  metoclopramide (REGLAN) 10 MG tablet, Take 10 mg by mouth 3 (three) times daily as needed. For nausea, Disp: , Rfl:  .  mometasone (NASONEX) 50 MCG/ACT nasal spray, Place 2 sprays into the nose daily., Disp: 17 g, Rfl: 1 .  montelukast (SINGULAIR) 10 MG tablet, TAKE 1 TABLET (10 MG TOTAL) BY MOUTH DAILY AT 6 PM., Disp: 90 tablet, Rfl: 3 .  Multiple Vitamin (MULTIVITAMIN) tablet, Take 1 tablet by mouth every morning. , Disp: , Rfl:  .  Omega-3 Fatty Acids (FISH OIL) 1200 MG CAPS, Take 1 capsule by mouth every evening., Disp: , Rfl:  .  potassium chloride (K-DUR) 10 MEQ tablet, Take two tablets once daily, Disp: 90 tablet, Rfl: 3 .  promethazine (PHENERGAN) 25 MG tablet, Take 1 tablet (25 mg total) by mouth every 8 (eight) hours as needed for nausea., Disp: 8 tablet, Rfl: 0 .  RABEprazole (ACIPHEX) 20 MG tablet, Take 1 tablet (20 mg total) by mouth daily., Disp: 90 tablet, Rfl: 3 .  silodosin (RAPAFLO) 8 MG CAPS capsule, Take 1 capsule (8 mg total) by mouth daily with breakfast., Disp: 90 capsule, Rfl: 3 .  tiZANidine (ZANAFLEX) 2 MG tablet, Take 2 mg by mouth at bedtime., Disp: , Rfl:  .  torsemide (DEMADEX) 10 MG tablet, Take 1 tablet (10 mg total) by mouth daily., Disp: 90 tablet, Rfl: 3 .  valACYclovir (VALTREX) 1000 MG tablet, TK 1 T PO TID FOR 14 DAYS, Disp: , Rfl:  .  vitamin E 400 UNIT capsule, Take 400 Units by mouth daily at 6 PM. , Disp: , Rfl:   EXAM:  VITALS per patient if applicable:  GENERAL: alert, oriented, appears well and in no acute distress  HEENT: atraumatic, conjunttiva clear, no obvious abnormalities on inspection of external nose and ears  NECK: normal movements of the head and neck  LUNGS: on inspection no signs of respiratory distress, breathing rate appears normal, no obvious gross SOB, gasping or wheezing  CV: no obvious cyanosis  MS: moves all visible  extremities without noticeable abnormality  PSYCH/NEURO: pleasant and cooperative, no obvious depression or anxiety, speech and thought processing grossly intact  ASSESSMENT AND PLAN:  Discussed the following assessment and plan:  Recent somewhat atypical chest pain.  Possibly medication related.  None since Sunday  -Follow-up promptly for any recurrent chest pain or other concerns -She is encouraged to contact her neurologist in [place] regarding her recent medication.side effects -she had recent mild bump in creatinine (suspect mild dehydration) and recommend one month follow up to reassess.     I discussed the assessment and treatment plan with the patient. The patient was provided an opportunity to ask questions and all were answered. The patient agreed with the plan and demonstrated an understanding of the instructions.   The patient was advised to call back or seek an in-person evaluation if the symptoms worsen or if the condition fails to improve as anticipated.    Carolann Littler, MD

## 2019-05-14 DIAGNOSIS — M47816 Spondylosis without myelopathy or radiculopathy, lumbar region: Secondary | ICD-10-CM | POA: Diagnosis not present

## 2019-05-16 ENCOUNTER — Telehealth (INDEPENDENT_AMBULATORY_CARE_PROVIDER_SITE_OTHER): Payer: Medicare Other | Admitting: Family Medicine

## 2019-05-16 ENCOUNTER — Other Ambulatory Visit: Payer: Self-pay

## 2019-05-16 ENCOUNTER — Encounter: Payer: Self-pay | Admitting: Family Medicine

## 2019-05-16 DIAGNOSIS — R0981 Nasal congestion: Secondary | ICD-10-CM

## 2019-05-16 NOTE — Progress Notes (Signed)
This visit type was conducted due to national recommendations for restrictions regarding the COVID-19 pandemic in an effort to limit this patient's exposure and mitigate transmission in our community.   Virtual Visit via Video Note  I connected with@ on 05/16/19 at  3:00 PM EDT by a video enabled telemedicine application and verified that I am speaking with the correct person using two identifiers.  Location patient: home Location provider:work or home office Persons participating in the virtual visit: patient, provider  I discussed the limitations of evaluation and management by telemedicine and the availability of in person appointments. The patient expressed understanding and agreed to proceed.   HPI: Ashley Castaneda has had some increased nasal congestion past couple days.  She has chronic allergies and has difficulty differentiating.  She has chronic aches related to fibromyalgia.  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  She is especially concerned because her husband has ALS and COPD.  Patient did have negative Covid test back October 11 when she went to the ER.  She denies any diarrhea.  No fever.  Some mild dyspnea but not much over her baseline.  Mild cough but she has some chronic mild intermittent cough.  She is taking Zyrtec regularly.   ROS: See pertinent positives and negatives per HPI.  Past Medical History:  Diagnosis Date  . Adenomatous colon polyp 09/2008  . ALLERGIC RHINITIS 07/01/2008  . ANXIETY 07/01/2008  . BACK PAIN, THORACIC REGION 03/04/2010  . BRONCHITIS, CHRONIC 07/01/2008  . CARPAL TUNNEL SYNDROME, HX OF 07/01/2008  . CHRONIC OBSTRUCTIVE PULMONARY DISEASE, ACUTE EXACERBATION 08/07/2010  . DEPRESSION 07/01/2008  . DVT, HX OF 07/01/2008  . ECZEMA 05/29/2009  . FACIAL PAIN 12/25/2009  . FIBROMYALGIA 07/01/2008  . GERD 07/01/2008  . Hiatal hernia   . Hyperlipidemia   . LEG PAIN, BILATERAL 07/01/2008  .  MIGRAINES, HX OF 07/01/2008  . OCCIPITAL NEURALGIA 07/01/2008  . OSTEOARTHRITIS 07/01/2008  . Other specified trigeminal nerve disorders 07/01/2008  . REFLEX SYMPATHETIC DYSTROPHY 07/01/2008  . Trigeminal neuralgia 05/21/2009  . Unspecified vitamin D deficiency 03/31/2010    Past Surgical History:  Procedure Laterality Date  . CARPAL TUNNEL RELEASE  2009   RIGHT  . HEAD CRANIECTOMY  1994   MICROVASULAR DECOMPRESSION  . LAVH     BSO  . LEFT KNEE SURGERY  1989  . OCCIPITAL NERVE STIMULATOR INSERTION    . RIGHT FOOT BONE SPUR  1987  . RIGHT FOOT SURGERY  1998   BAD CUT  . RIGHT JAW SURGERY  2002  . RIGHT KNEE SURGERY  1990  . [place]  . RIGHT THUMB SUGERY  2004  . TONSILLECTOMY  1957    Family History  Problem Relation Age of Onset  . Ovarian cancer Mother 10  . Hypertension Father   . Prostate cancer Father   . Heart attack Father   . Heart attack Maternal Grandmother   . Stroke Paternal Grandmother     SOCIAL HX: Quit smoking about 7 years ago   Current Outpatient Medications:  .  ALPRAZolam (XANAX) 0.5 MG tablet, Take 1 tablet (0.5 mg total) by mouth 2 (two) times daily., Disp: 180 tablet, Rfl: 1 .  aspirin 325 MG tablet, daily., Disp: , Rfl:  .  baclofen (LIORESAL) 10 MG tablet, baclofen 10 mg tablet  Take 1 tablet 4 times a day by oral route as needed.,  Disp: , Rfl:  .  butalbital-acetaminophen-caffeine (FIORICET, ESGIC) 50-325-40 MG tablet, butalbital-acetaminophen-caffeine 50 mg-325 mg-40 mg tablet  TAKE 1-2 TAB EVERY 8 HRS FOR HEADACHE,PAIN SCALE 0-10,1-3 TAKE TYLENOL ONLY 4-6 1 TAB,7-10 2 TABS, Disp: 30 tablet, Rfl: 0 .  calcium carbonate (OS-CAL - DOSED IN MG OF ELEMENTAL CALCIUM) 1250 MG tablet, Take 1 tablet by mouth daily., Disp: , Rfl:  .  carboxymethylcellulose (REFRESH PLUS) 0.5 % SOLN, Take 1 drop by mouth daily as needed. For dry eyes, Disp: , Rfl:  .  cetirizine (ZYRTEC) 10 MG tablet, Take 10 mg by mouth daily.  , Disp: ,  Rfl:  .  colchicine 0.6 MG tablet, Take 1 tablet (0.6 mg total) by mouth daily. (Patient not taking: Reported on 04/09/2019), Disp: 90 tablet, Rfl: 3 .  cycloSPORINE (RESTASIS) 0.05 % ophthalmic emulsion, Place 1 drop into both eyes 2 (two) times daily., Disp: , Rfl:  .  dexamethasone 0.5 MG/5ML elixir, Take two teaspoons and swish, gargle, and spit qid prn canker sores. (Patient not taking: Reported on 04/09/2019), Disp: 237 mL, Rfl: 1 .  dronabinol (MARINOL) 5 MG capsule, dronabinol 5 mg capsule  TAKE ONE CAPSULE BY MOUTH TWICE A DAY, Disp: , Rfl:  .  fluticasone (FLONASE) 50 MCG/ACT nasal spray, Place 2 sprays into both nostrils daily as needed. For nasal congestion, Disp: 16 g, Rfl: 3 .  frovatriptan (FROVA) 2.5 MG tablet, Take 2.5 mg by mouth as needed. If recurs, may repeat after 2 hours. Max of 3 tabs in 24 hours. For migraines., Disp: , Rfl:  .  Galcanezumab-gnlm 120 MG/ML SOSY, Inject into the skin., Disp: , Rfl:  .  hydrOXYzine (ATARAX) 10 MG tablet, Take 10 mg by mouth every 8 (eight) hours as needed. For dizziness per Renaldo Reel, MD, Disp: , Rfl:  .  lidocaine (LIDODERM) 5 %, Place 1 patch onto the skin as needed. Remove & Discard patch within 12 hours or as directed by Renaldo Reel MD , Disp: , Rfl:  .  lidocaine (LIDODERM) 5 %, Place 1 patch onto the skin daily. Remove & Discard patch within 12 hours or as directed by MD, Disp: 30 patch, Rfl: 1 .  meclizine (ANTIVERT) 25 MG tablet, TAKE 1 TABLET BY MOUTH 4 TIMES A DAY AS NEEDED FOR DIZZINESS, Disp: 120 tablet, Rfl: 1 .  meloxicam (MOBIC) 15 MG tablet, Take 1 tablet (15 mg total) by mouth daily., Disp: 90 tablet, Rfl: 3 .  metaxalone (SKELAXIN) 800 MG tablet, Take 800 mg by mouth 3 (three) times daily.  , Disp: , Rfl:  .  metoclopramide (REGLAN) 10 MG tablet, Take 10 mg by mouth 3 (three) times daily as needed. For nausea, Disp: , Rfl:  .  mometasone (NASONEX) 50 MCG/ACT nasal spray, Place 2 sprays into the nose daily., Disp: 17 g, Rfl: 1 .   montelukast (SINGULAIR) 10 MG tablet, TAKE 1 TABLET (10 MG TOTAL) BY MOUTH DAILY AT 6 PM., Disp: 90 tablet, Rfl: 3 .  Multiple Vitamin (MULTIVITAMIN) tablet, Take 1 tablet by mouth every morning. , Disp: , Rfl:  .  Omega-3 Fatty Acids (FISH OIL) 1200 MG CAPS, Take 1 capsule by mouth every evening., Disp: , Rfl:  .  potassium chloride (K-DUR) 10 MEQ tablet, Take two tablets once daily, Disp: 90 tablet, Rfl: 3 .  promethazine (PHENERGAN) 25 MG tablet, Take 1 tablet (25 mg total) by mouth every 8 (eight) hours as needed for nausea., Disp: 8 tablet, Rfl: 0 .  RABEprazole (  ACIPHEX) 20 MG tablet, Take 1 tablet (20 mg total) by mouth daily., Disp: 90 tablet, Rfl: 3 .  silodosin (RAPAFLO) 8 MG CAPS capsule, Take 1 capsule (8 mg total) by mouth daily with breakfast., Disp: 90 capsule, Rfl: 3 .  tiZANidine (ZANAFLEX) 2 MG tablet, Take 2 mg by mouth at bedtime., Disp: , Rfl:  .  torsemide (DEMADEX) 10 MG tablet, Take 1 tablet (10 mg total) by mouth daily., Disp: 90 tablet, Rfl: 3 .  valACYclovir (VALTREX) 1000 MG tablet, TK 1 T PO TID FOR 14 DAYS, Disp: , Rfl:  .  vitamin E 400 UNIT capsule, Take 400 Units by mouth daily at 6 PM. , Disp: , Rfl:   EXAM:  VITALS per patient if applicable:  GENERAL: alert, oriented, appears well and in no acute distress  HEENT: atraumatic, conjunttiva clear, no obvious abnormalities on inspection of external nose and ears  NECK: normal movements of the head and neck  LUNGS: on inspection no signs of respiratory distress, breathing rate appears normal, no obvious gross SOB, gasping or wheezing  CV: no obvious cyanosis  MS: moves all visible extremities without noticeable abnormality  PSYCH/NEURO: pleasant and cooperative, no obvious depression or anxiety, speech and thought processing grossly intact  ASSESSMENT AND PLAN:  Discussed the following assessment and plan:  Nasal congestive symptoms.  May all be just allergic.  She has chronic myalgias related to her  fibromyalgia which makes this symptom difficult to assess  -We recommend she go to drive up site tomorrow morning for Covid testing -Strongly advised that she stay quarantined until further clarified and especially away from her husband who has very high risk    I discussed the assessment and treatment plan with the patient. The patient was provided an opportunity to ask questions and all were answered. The patient agreed with the plan and demonstrated an understanding of the instructions.   The patient was advised to call back or seek an in-person evaluation if the symptoms worsen or if the condition fails to improve as anticipated.    Carolann Littler, MD ]

## 2019-05-21 ENCOUNTER — Encounter: Payer: Self-pay | Admitting: Family Medicine

## 2019-05-22 MED ORDER — SILODOSIN 8 MG PO CAPS: 8.0000 mg | ORAL_CAPSULE | Freq: Every day | ORAL | 3 refills | Status: DC

## 2019-06-12 DIAGNOSIS — M47816 Spondylosis without myelopathy or radiculopathy, lumbar region: Secondary | ICD-10-CM | POA: Diagnosis not present

## 2019-06-12 DIAGNOSIS — M5412 Radiculopathy, cervical region: Secondary | ICD-10-CM | POA: Diagnosis not present

## 2019-06-14 DIAGNOSIS — M47816 Spondylosis without myelopathy or radiculopathy, lumbar region: Secondary | ICD-10-CM | POA: Diagnosis not present

## 2019-06-15 ENCOUNTER — Other Ambulatory Visit: Payer: Self-pay

## 2019-08-28 ENCOUNTER — Encounter: Payer: Self-pay | Admitting: Family Medicine

## 2019-08-29 ENCOUNTER — Telehealth (INDEPENDENT_AMBULATORY_CARE_PROVIDER_SITE_OTHER): Payer: Medicare Other | Admitting: Family Medicine

## 2019-08-29 ENCOUNTER — Other Ambulatory Visit: Payer: Self-pay

## 2019-08-29 VITALS — Temp 97.4°F | Wt 235.0 lb

## 2019-08-29 DIAGNOSIS — R0789 Other chest pain: Secondary | ICD-10-CM | POA: Diagnosis not present

## 2019-08-29 DIAGNOSIS — R0600 Dyspnea, unspecified: Secondary | ICD-10-CM

## 2019-08-29 DIAGNOSIS — R0609 Other forms of dyspnea: Secondary | ICD-10-CM

## 2019-08-29 MED ORDER — ALPRAZOLAM 0.5 MG PO TABS: 0.5000 mg | ORAL_TABLET | Freq: Two times a day (BID) | ORAL | 1 refills | Status: DC

## 2019-08-29 NOTE — Progress Notes (Signed)
This visit type was conducted due to national recommendations for restrictions regarding the COVID-19 pandemic in an effort to limit this patient's exposure and mitigate transmission in our community.   Virtual Visit via Video Note  I connected with [name] on 08/29/19 at  1:30 PM EST by a video enabled telemedicine application and verified that I am speaking with the correct person using two identifiers.  Location patient: home Location provider:work or home office Persons participating in the virtual visit: patient, provider  I discussed the limitations of evaluation and management by telemedicine and the availability of in person appointments. The patient expressed understanding and agreed to proceed.   HPI:  Ashley Castaneda has multiple chronic problems including history of trigeminal neuralgia, osteoarthritis, occipital neuralgia, migraine headaches, fibromyalgia, GERD, hyperlipidemia, history of DVT, history of depression.  She has had some chronic cervical and back pains as well as very challenging to manage headaches and she is followed by neurology at tertiary center.  She called today with some increased shortness of breath with exertion such as stairs over the past year.  They are getting some renovations in their house and some increased dust.  She takes Singulair and Zyrtec at baseline.  She has not had any true chest pain but she does describe occasional tightness substernally.  No radiation to the neck or arm.  No diaphoresis.  No nausea or vomiting.  She had nuclear stress test 2014 with no ischemia.  She has had increased peripheral edema in her lower extremities in past and has taken Demadex for that.  No recent peripheral edema.  No orthopnea.  She is dealing with increased family stress issues.  Her husband has ALS.   She does have history of smoking.  She used an old albuterol inhaler but not sure this has helped much.  Past Medical History:  Diagnosis Date  . Adenomatous colon  polyp 09/2008  . ALLERGIC RHINITIS 07/01/2008  . ANXIETY 07/01/2008  . BACK PAIN, THORACIC REGION 03/04/2010  . BRONCHITIS, CHRONIC 07/01/2008  . CARPAL TUNNEL SYNDROME, HX OF 07/01/2008  . CHRONIC OBSTRUCTIVE PULMONARY DISEASE, ACUTE EXACERBATION 08/07/2010  . DEPRESSION 07/01/2008  . DVT, HX OF 07/01/2008  . ECZEMA 05/29/2009  . FACIAL PAIN 12/25/2009  . FIBROMYALGIA 07/01/2008  . GERD 07/01/2008  . Hiatal hernia   . Hyperlipidemia   . LEG PAIN, BILATERAL 07/01/2008  . MIGRAINES, HX OF 07/01/2008  . OCCIPITAL NEURALGIA 07/01/2008  . OSTEOARTHRITIS 07/01/2008  . Other specified trigeminal nerve disorders 07/01/2008  . REFLEX SYMPATHETIC DYSTROPHY 07/01/2008  . Trigeminal neuralgia 05/21/2009  . Unspecified vitamin D deficiency 03/31/2010   Past Surgical History:  Procedure Laterality Date  . CARPAL TUNNEL RELEASE  2009   RIGHT  . HEAD CRANIECTOMY  1994   MICROVASULAR DECOMPRESSION  . LAVH     BSO  . LEFT KNEE SURGERY  1989  . OCCIPITAL NERVE STIMULATOR INSERTION    . RIGHT FOOT BONE SPUR  1987  . RIGHT FOOT SURGERY  1998   BAD CUT  . RIGHT JAW SURGERY  2002  . RIGHT KNEE SURGERY  1990  . [place]  . RIGHT THUMB SUGERY  2004  . TONSILLECTOMY  1957    reports that she quit smoking about 7 years ago. Her smoking use included cigarettes. She has a 60.00 pack-year smoking history. She uses smokeless tobacco. She reports that she does not drink alcohol or use drugs. family history includes Heart attack in her father  and maternal grandmother; Hypertension in her father; Ovarian cancer (age of onset: 47) in her mother; Prostate cancer in her father; Stroke in her paternal grandmother. Allergies  Allergen Reactions  . Cefazolin Anaphylaxis, Swelling and Other (See Comments)    Ancef - in surgery, swelled up, turned blue, heart problems Turns Blue  . Baclofen Other (See Comments)    REACTION: confusion REACTION: confusion REACTION: confusion REACTION:  confusion  . Carbamazepine Other (See Comments)    REACTION: confusion Other reaction(s): Confusion Confusion,Bad reactions to all seizure medicine  . Duloxetine Other (See Comments)  . Fentanyl Nausea And Vomiting and Other (See Comments)    REACTION: nausea and vomiting  . Gabapentin Other (See Comments)    REACTION: confusion  . Naproxen Other (See Comments)    Elevates blood pressure  . Oxcarbazepine Other (See Comments)    REACTION: confusion Other reaction(s): Other REACTION: confusion REACTION: confusion REACTION: confusion   . Pregabalin Other (See Comments)    Other reaction(s): Other  . Serotonin Reuptake Inhibitors (Ssris) Other (See Comments)  . Topiramate Other (See Comments)    Other reaction(s): Unknown  . Venlafaxine Other (See Comments)    Other reaction(s): Unknown  . Zonisamide Other (See Comments)    Other reaction(s): Other  . Ceclor [Cefaclor]     hives  . Milnacipran Other (See Comments)  . Ondansetron Nausea And Vomiting    Nausea and vomiting after IV administration      ROS: See pertinent positives and negatives per HPI.  Past Medical History:  Diagnosis Date  . Adenomatous colon polyp 09/2008  . ALLERGIC RHINITIS 07/01/2008  . ANXIETY 07/01/2008  . BACK PAIN, THORACIC REGION 03/04/2010  . BRONCHITIS, CHRONIC 07/01/2008  . CARPAL TUNNEL SYNDROME, HX OF 07/01/2008  . CHRONIC OBSTRUCTIVE PULMONARY DISEASE, ACUTE EXACERBATION 08/07/2010  . DEPRESSION 07/01/2008  . DVT, HX OF 07/01/2008  . ECZEMA 05/29/2009  . FACIAL PAIN 12/25/2009  . FIBROMYALGIA 07/01/2008  . GERD 07/01/2008  . Hiatal hernia   . Hyperlipidemia   . LEG PAIN, BILATERAL 07/01/2008  . MIGRAINES, HX OF 07/01/2008  . OCCIPITAL NEURALGIA 07/01/2008  . OSTEOARTHRITIS 07/01/2008  . Other specified trigeminal nerve disorders 07/01/2008  . REFLEX SYMPATHETIC DYSTROPHY 07/01/2008  . Trigeminal neuralgia 05/21/2009  . Unspecified vitamin D deficiency 03/31/2010    Past Surgical History:   Procedure Laterality Date  . CARPAL TUNNEL RELEASE  2009   RIGHT  . HEAD CRANIECTOMY  1994   MICROVASULAR DECOMPRESSION  . LAVH     BSO  . LEFT KNEE SURGERY  1989  . OCCIPITAL NERVE STIMULATOR INSERTION    . RIGHT FOOT BONE SPUR  1987  . RIGHT FOOT SURGERY  1998   BAD CUT  . RIGHT JAW SURGERY  2002  . RIGHT KNEE SURGERY  1990  . [place]  . RIGHT THUMB SUGERY  2004  . TONSILLECTOMY  1957    Family History  Problem Relation Age of Onset  . Ovarian cancer Mother 72  . Hypertension Father   . Prostate cancer Father   . Heart attack Father   . Heart attack Maternal Grandmother   . Stroke Paternal Grandmother     SOCIAL HX: Ex-smoker.  Lives at home with her husband who has ALS and other medical problems  Current Outpatient Medications:  .  ALPRAZolam (XANAX) 0.5 MG tablet, Take 1 tablet (0.5 mg total) by mouth 2 (two) times daily., Disp: 180 tablet, Rfl: 1 .  aspirin 325 MG tablet, daily., Disp: , Rfl:  .  baclofen (LIORESAL) 10 MG tablet, baclofen 10 mg tablet  Take 1 tablet 4 times a day by oral route as needed., Disp: , Rfl:  .  calcium carbonate (OS-CAL - DOSED IN MG OF ELEMENTAL CALCIUM) 1250 MG tablet, Take 1 tablet by mouth daily., Disp: , Rfl:  .  carboxymethylcellulose (REFRESH PLUS) 0.5 % SOLN, Take 1 drop by mouth daily as needed. For dry eyes, Disp: , Rfl:  .  cetirizine (ZYRTEC) 10 MG tablet, Take 10 mg by mouth daily.  , Disp: , Rfl:  .  colchicine 0.6 MG tablet, Take 1 tablet (0.6 mg total) by mouth daily., Disp: 90 tablet, Rfl: 3 .  cycloSPORINE (RESTASIS) 0.05 % ophthalmic emulsion, Place 1 drop into both eyes 2 (two) times daily., Disp: , Rfl:  .  dexamethasone 0.5 MG/5ML elixir, Take two teaspoons and swish, gargle, and spit qid prn canker sores., Disp: 237 mL, Rfl: 1 .  dronabinol (MARINOL) 5 MG capsule, dronabinol 5 mg capsule  TAKE ONE CAPSULE BY MOUTH TWICE A DAY, Disp: , Rfl:  .  fluticasone (FLONASE) 50 MCG/ACT  nasal spray, Place 2 sprays into both nostrils daily as needed. For nasal congestion, Disp: 16 g, Rfl: 3 .  frovatriptan (FROVA) 2.5 MG tablet, Take 2.5 mg by mouth as needed. If recurs, may repeat after 2 hours. Max of 3 tabs in 24 hours. For migraines., Disp: , Rfl:  .  Galcanezumab-gnlm 120 MG/ML SOSY, Inject into the skin., Disp: , Rfl:  .  hydrOXYzine (ATARAX) 10 MG tablet, Take 10 mg by mouth every 8 (eight) hours as needed. For dizziness per Renaldo Reel, MD, Disp: , Rfl:  .  lidocaine (LIDODERM) 5 %, Place 1 patch onto the skin daily. Remove & Discard patch within 12 hours or as directed by MD, Disp: 30 patch, Rfl: 1 .  meclizine (ANTIVERT) 25 MG tablet, TAKE 1 TABLET BY MOUTH 4 TIMES A DAY AS NEEDED FOR DIZZINESS, Disp: 120 tablet, Rfl: 1 .  meloxicam (MOBIC) 15 MG tablet, Take 1 tablet (15 mg total) by mouth daily., Disp: 90 tablet, Rfl: 3 .  metaxalone (SKELAXIN) 800 MG tablet, Take 800 mg by mouth 3 (three) times daily.  , Disp: , Rfl:  .  metoclopramide (REGLAN) 10 MG tablet, Take 10 mg by mouth 3 (three) times daily as needed. For nausea, Disp: , Rfl:  .  mometasone (NASONEX) 50 MCG/ACT nasal spray, Place 2 sprays into the nose daily., Disp: 17 g, Rfl: 1 .  montelukast (SINGULAIR) 10 MG tablet, TAKE 1 TABLET (10 MG TOTAL) BY MOUTH DAILY AT 6 PM., Disp: 90 tablet, Rfl: 3 .  Multiple Vitamin (MULTIVITAMIN) tablet, Take 1 tablet by mouth every morning. , Disp: , Rfl:  .  Omega-3 Fatty Acids (FISH OIL) 1200 MG CAPS, Take 1 capsule by mouth every evening., Disp: , Rfl:  .  potassium chloride (K-DUR) 10 MEQ tablet, Take two tablets once daily, Disp: 90 tablet, Rfl: 3 .  RABEprazole (ACIPHEX) 20 MG tablet, Take 1 tablet (20 mg total) by mouth daily., Disp: 90 tablet, Rfl: 3 .  silodosin (RAPAFLO) 8 MG CAPS capsule, Take 1 capsule (8 mg total) by mouth daily with breakfast., Disp: 90 capsule, Rfl: 3 .  tiZANidine (ZANAFLEX) 2 MG tablet, Take 2 mg by mouth at bedtime., Disp: , Rfl:  .  torsemide  (DEMADEX) 10 MG tablet, Take 1 tablet (10 mg total) by mouth daily., Disp: 90  tablet, Rfl: 3 .  vitamin E 400 UNIT capsule, Take 400 Units by mouth daily at 6 PM. , Disp: , Rfl:  .  butalbital-acetaminophen-caffeine (FIORICET, ESGIC) 50-325-40 MG tablet, butalbital-acetaminophen-caffeine 50 mg-325 mg-40 mg tablet  TAKE 1-2 TAB EVERY 8 HRS FOR HEADACHE,PAIN SCALE 0-10,1-3 TAKE TYLENOL ONLY 4-6 1 TAB,7-10 2 TABS, Disp: 30 tablet, Rfl: 0 .  lidocaine (LIDODERM) 5 %, Place 1 patch onto the skin as needed. Remove & Discard patch within 12 hours or as directed by Renaldo Reel MD , Disp: , Rfl:  .  promethazine (PHENERGAN) 25 MG tablet, Take 1 tablet (25 mg total) by mouth every 8 (eight) hours as needed for nausea., Disp: 8 tablet, Rfl: 0 .  valACYclovir (VALTREX) 1000 MG tablet, TK 1 T PO TID FOR 14 DAYS, Disp: , Rfl:   EXAM:  VITALS per patient if applicable:  GENERAL: alert, oriented, appears well and in no acute distress  HEENT: atraumatic, conjunttiva clear, no obvious abnormalities on inspection of external nose and ears  NECK: normal movements of the head and neck  LUNGS: on inspection no signs of respiratory distress, breathing rate appears normal, no obvious gross SOB, gasping or wheezing  CV: no obvious cyanosis  MS: moves all visible extremities without noticeable abnormality  PSYCH/NEURO: pleasant and cooperative, no obvious depression or anxiety, speech and thought processing grossly intact  ASSESSMENT AND PLAN:  Discussed the following assessment and plan:  Patient relates progressive dyspnea with activity and intermittent chest tightness over the past year.  She has deconditioning and also question of some prior COPD which makes this difficult to sort out.  -We recommended cardiology referral for further evaluation.  May need echocardiogram and other cardiac testing to help sort out.  -She was requesting refills of her alprazolam which she has been on many years.  Her  neurologist thinks this may be helping some with her chronic headaches Refilled for 6 months.    I discussed the assessment and treatment plan with the patient. The patient was provided an opportunity to ask questions and all were answered. The patient agreed with the plan and demonstrated an understanding of the instructions.   The patient was advised to call back or seek an in-person evaluation if the symptoms worsen or if the condition fails to improve as anticipated.     Carolann Littler, MD

## 2019-09-03 NOTE — Progress Notes (Signed)
Cardiology Office Note    Date:  09/04/2019   ID:  Ashley Castaneda, DOB 1950/09/13, MRN [id number]  PCP:  Ashley Post, MD  Cardiologist: Ashley Grooms, MD EPS: None  Chief Complaint  Patient presents with  . Shortness of Breath    History of Present Illness:  Ashley Castaneda is a 69 y.o. female with history of multiple DVT not on anticoagulation, fibromyalgia, chronic back pain, autonomic dysfunction, stimulator placed 06/2017 but persistent pain.  She saw Dr. Irish Castaneda 08/2017 for palpitations.  Holter monitor 08/2017 normal sinus rhythm with PACs and PVCs symptoms did not correlate with ectopic beats.  Patient saw Dr. Elease Castaneda 08/29/2019 complaining of increased shortness of breath with exertion such as stairs over the past year.  Occasional chest tightness.  Was felt some of her symptoms could be secondary to deconditioning and COPD but cardiac work-up recommended.  Patient says she last spring she became short of breath while walking in the yard and had to sit down. Has a central burning in her chest that can last for hours. She has a Hiatal hernia with reflux. Short of breath walking in here today. Says the masks seem to make it worse. Exercising less with [phone]. Remodeled her home and a lot of dust in the house that could have made asthma worse. Husband smokes in the house. He has ALS and dementia. She's under a tremendous amount of stress.Grandparents had strokes. Mother died of ovarian cancer. Her MGM died 27 MI   Past Medical History:  Diagnosis Date  . Adenomatous colon polyp 09/2008  . ALLERGIC RHINITIS 07/01/2008  . ANXIETY 07/01/2008  . BACK PAIN, THORACIC REGION 03/04/2010  . BRONCHITIS, CHRONIC 07/01/2008  . CARPAL TUNNEL SYNDROME, HX OF 07/01/2008  . CHRONIC OBSTRUCTIVE PULMONARY DISEASE, ACUTE EXACERBATION 08/07/2010  . DEPRESSION 07/01/2008  . DVT, HX OF 07/01/2008  . ECZEMA 05/29/2009  . FACIAL PAIN 12/25/2009  . FIBROMYALGIA 07/01/2008  . GERD 07/01/2008  . Hiatal  hernia   . Hyperlipidemia   . LEG PAIN, BILATERAL 07/01/2008  . MIGRAINES, HX OF 07/01/2008  . OCCIPITAL NEURALGIA 07/01/2008  . OSTEOARTHRITIS 07/01/2008  . Other specified trigeminal nerve disorders 07/01/2008  . REFLEX SYMPATHETIC DYSTROPHY 07/01/2008  . Trigeminal neuralgia 05/21/2009  . Unspecified vitamin D deficiency 03/31/2010    Past Surgical History:  Procedure Laterality Date  . CARPAL TUNNEL RELEASE  2009   RIGHT  . HEAD CRANIECTOMY  1994   MICROVASULAR DECOMPRESSION  . LAVH     BSO  . LEFT KNEE SURGERY  1989  . OCCIPITAL NERVE STIMULATOR INSERTION    . RIGHT FOOT BONE SPUR  1987  . RIGHT FOOT SURGERY  1998   BAD CUT  . RIGHT JAW SURGERY  2002  . RIGHT KNEE SURGERY  1990  . [place]  . RIGHT THUMB SUGERY  2004  . TONSILLECTOMY  1957    Current Medications: Current Meds  Medication Sig  . ALPRAZolam (XANAX) 0.5 MG tablet Take 1 tablet (0.5 mg total) by mouth 2 (two) times daily.  Marland Kitchen aspirin 325 MG tablet daily.  . baclofen (LIORESAL) 10 MG tablet baclofen 10 mg tablet  Take 1 tablet 4 times a day by oral route as needed.  . calcium carbonate (OS-CAL - DOSED IN MG OF ELEMENTAL CALCIUM) 1250 MG tablet Take 1 tablet by mouth daily.  . carboxymethylcellulose (REFRESH PLUS) 0.5 % SOLN Take 1 drop by mouth daily as needed. For dry  eyes  . cetirizine (ZYRTEC) 10 MG tablet Take 10 mg by mouth daily.    . colchicine 0.6 MG tablet Take 1 tablet (0.6 mg total) by mouth daily.  . cycloSPORINE (RESTASIS) 0.05 % ophthalmic emulsion Place 1 drop into both eyes 2 (two) times daily.  Marland Kitchen dexamethasone 0.5 MG/5ML elixir Take two teaspoons and swish, gargle, and spit qid prn canker sores.  Marland Kitchen dronabinol (MARINOL) 5 MG capsule dronabinol 5 mg capsule  TAKE ONE CAPSULE BY MOUTH TWICE A DAY  . fluticasone (FLONASE) 50 MCG/ACT nasal spray Place 2 sprays into both nostrils daily as needed. For nasal congestion  . frovatriptan (FROVA) 2.5 MG tablet Take 2.5  mg by mouth as needed. If recurs, may repeat after 2 hours. Max of 3 tabs in 24 hours. For migraines.  . Galcanezumab-gnlm 120 MG/ML SOSY Inject into the skin.  . hydrOXYzine (ATARAX) 10 MG tablet Take 10 mg by mouth every 8 (eight) hours as needed. For dizziness per Ashley Reel, MD  . lidocaine (LIDODERM) 5 % Place 1 patch onto the skin daily. Remove & Discard patch within 12 hours or as directed by MD  . meclizine (ANTIVERT) 25 MG tablet TAKE 1 TABLET BY MOUTH 4 TIMES A DAY AS NEEDED FOR DIZZINESS  . meloxicam (MOBIC) 15 MG tablet Take 1 tablet (15 mg total) by mouth daily.  . metaxalone (SKELAXIN) 800 MG tablet Take 800 mg by mouth 3 (three) times daily.    . metoclopramide (REGLAN) 10 MG tablet Take 10 mg by mouth 3 (three) times daily as needed. For nausea  . montelukast (SINGULAIR) 10 MG tablet TAKE 1 TABLET (10 MG TOTAL) BY MOUTH DAILY AT 6 PM.  . Multiple Vitamin (MULTIVITAMIN) tablet Take 1 tablet by mouth every morning.   . Omega-3 Fatty Acids (FISH OIL) 1200 MG CAPS Take 1 capsule by mouth every evening.  . potassium chloride (K-DUR) 10 MEQ tablet Take two tablets once daily  . RABEprazole (ACIPHEX) 20 MG tablet Take 1 tablet (20 mg total) by mouth daily.  . silodosin (RAPAFLO) 8 MG CAPS capsule Take 1 capsule (8 mg total) by mouth daily with breakfast.  . tiZANidine (ZANAFLEX) 2 MG tablet Take 2 mg by mouth at bedtime.  . torsemide (DEMADEX) 10 MG tablet Take 1 tablet (10 mg total) by mouth daily.  . vitamin E 400 UNIT capsule Take 400 Units by mouth daily at 6 PM.      Allergies:   Cefazolin, Baclofen, Carbamazepine, Duloxetine, Fentanyl, Gabapentin, Naproxen, Oxcarbazepine, Pregabalin, Serotonin reuptake inhibitors (ssris), Topiramate, Venlafaxine, Zonisamide, Ceclor [cefaclor], Milnacipran, and Ondansetron   Social History   Socioeconomic History  . Marital status: Married    Spouse name: Ashley Castaneda  . Number of children: 0  . Years of education: college-3  . Highest education  level: Not on file  Occupational History  . Occupation: disabled  Tobacco Use  . Smoking status: Former Smoker    Packs/day: 2.00    Years: 30.00    Pack years: 60.00    Types: Cigarettes    Quit date: 02/04/2012    Years since quitting: 7.5  . Smokeless tobacco: Current User  . Tobacco comment: nicotine in vapor and is very low nicotine   Substance and Sexual Activity  . Alcohol use: No    Alcohol/week: 0.0 standard drinks    Comment: quit 2000  . Drug use: No  . Sexual activity: Never  Other Topics Concern  . Not on file  Social History Narrative  Patient lives at home with husband Ashley Castaneda.    Patient has no children and 2 step children.    Patient is on disability since 45 for arthalgia.    Patient has 3 years of college.    Patient is right handed.    Social Determinants of Health   Financial Resource Strain:   . Difficulty of Paying Living Expenses: Not on file  Food Insecurity:   . Worried About Charity fundraiser in the Last Year: Not on file  . Ran Out of Food in the Last Year: Not on file  Transportation Needs:   . Castaneda of Transportation (Medical): Not on file  . Castaneda of Transportation (Non-Medical): Not on file  Physical Activity:   . Days of Exercise per Week: Not on file  . Minutes of Exercise per Session: Not on file  Stress:   . Feeling of Stress : Not on file  Social Connections:   . Frequency of Communication with Friends and Family: Not on file  . Frequency of Social Gatherings with Friends and Family: Not on file  . Attends Religious Services: Not on file  . Active Member of Clubs or Organizations: Not on file  . Attends Archivist Meetings: Not on file  . Marital Status: Not on file     Family History:  The patient's   family history includes Heart attack in her father and maternal grandmother; Hypertension in her father; Ovarian cancer (age of onset: 49) in her mother; Prostate cancer in her father; Stroke in her paternal grandmother.    ROS:   Please see the history of present illness.    ROS All other systems reviewed and are negative.   PHYSICAL EXAM:   VS:  BP 128/68   Pulse 77   Ht 5\' 3"  (1.6 m)   Wt 245 lb 12.8 oz (111.5 kg)   LMP 07/26/1992   SpO2 98%   BMI 43.54 kg/m   Physical Exam  GEN: Obese, in no acute distress  Neck: no JVD, carotid bruits, or masses Cardiac:RRR; no murmurs, rubs, or gallops  Respiratory:  clear to auscultation bilaterally, normal work of breathing GI: soft, nontender, nondistended, + BS Ext: without cyanosis, clubbing, or edema, Good distal pulses bilaterally Neuro:  Alert and Oriented x 3 Psych: euthymic mood, full affect  Wt Readings from Last 3 Encounters:  09/04/19 245 lb 12.8 oz (111.5 kg)  08/29/19 235 lb (106.6 kg)  02/15/18 250 lb 7 oz (113.6 kg)      Studies/Labs Reviewed:   EKG:  EKG is not ordered today.  The ekg reviewed from 05/06/19 NSR.  Recent Labs: 05/06/2019: BUN 25; Creatinine, Ser 1.27; Hemoglobin 13.4; Platelets 320; Potassium 3.9; Sodium 137   Lipid Panel    Component Value Date/Time   CHOL 221 (H) 06/14/2013 1107   TRIG 156.0 (H) 06/14/2013 1107   HDL 55.00 06/14/2013 1107   CHOLHDL 4 06/14/2013 1107   VLDL 31.2 06/14/2013 1107   LDLDIRECT 149.8 06/14/2013 1107    Additional studies/ records that were reviewed today include:     Holter monitor 2/2019Normal sinus rhythm with occasional PACs and PVCs.  No pathologic arrhtyhmias.  Symptoms did not typically correlate with the ectopic beats.   NST 2014 Impression Exercise Capacity:  Lexiscan with no exercise. BP Response:  Normal blood pressure response. Clinical Symptoms:  No significant symptoms noted. ECG Impression:  No significant ST segment change suggestive of ischemia. Comparison with Prior Nuclear Study: No previous nuclear  study performed   Overall Impression:  Normal stress nuclear study.   LV Wall Motion:  NL LV Function; NL Wall Motion    ASSESSMENT:    1.  Dyspnea on exertion   2. Palpitations   3. Hyperlipidemia, unspecified hyperlipidemia type   4. History of DVT (deep vein thrombosis)   5. Severe obesity (BMI >= 40) (HCC)      PLAN:  In order of problems listed above:  Exertional dyspnea and chest pain could be secondary to deconditioning and COPD but must rule out cardiac source.  Normal Lexiscan in 2014-will repeat lexiscan myoview.  History of palpitations Holter monitor 08/2017 normal sinus rhythm with PACs and PVCs-no recurrence.  History of recurrent DVTs-on ASA 325 mg   Hyperlipidemia no recent lipid profile-will check.  Obesity-weight loss recommended. She'd like to try slim fast again.offered refferal to weight loss center but she declined.    Medication Adjustments/Labs and Tests Ordered: Current medicines are reviewed at length with the patient today.  Concerns regarding medicines are outlined above.  Medication changes, Labs and Tests ordered today are listed in the Patient Instructions below. Patient Instructions  Medication Instructions:  Your physician recommends that you continue on your current medications as directed. Please refer to the Current Medication list given to you today.  *If you need a refill on your cardiac medications before your next appointment, please call your pharmacy*  Lab Work: Your physician recommends that you return for a FASTING lipid profile  If you have labs (blood work) drawn today and your tests are completely normal, you will receive your results only by: Marland Kitchen MyChart Message (if you have MyChart) OR . A paper copy in the mail If you have any lab test that is abnormal or we need to change your treatment, we will call you to review the results.  Testing/Procedures: Your physician has requested that you have a lexiscan myoview. For further information please visit [email or web address]. Please follow instruction sheet, as given.   Follow-Up: At [facility], you and your health  needs are our priority.  As part of our continuing mission to provide you with exceptional heart care, we have created designated Provider Care Teams.  These Care Teams include your primary Cardiologist (physician) and Advanced Practice Providers (APPs -  Physician Assistants and Nurse Practitioners) who all work together to provide you with the care you need, when you need it.  Your next appointment:   After Stress Test -  The format for your next appointment:   Either In Person or Virtual  Provider:   Ermalinda Barrios, PA-C  Other Instructions      Signed, Ermalinda Barrios, PA-C  09/04/2019 1:29 PM    [facility] [place], [place], [place]  [address] Phone: [phone]; Fax: [phone]

## 2019-09-04 ENCOUNTER — Other Ambulatory Visit: Payer: Self-pay

## 2019-09-04 ENCOUNTER — Ambulatory Visit (INDEPENDENT_AMBULATORY_CARE_PROVIDER_SITE_OTHER): Payer: Medicare Other | Admitting: Physician Assistant

## 2019-09-04 ENCOUNTER — Encounter: Payer: Self-pay | Admitting: Physician Assistant

## 2019-09-04 VITALS — BP 128/68 | HR 77 | Ht 63.0 in | Wt 245.8 lb

## 2019-09-04 DIAGNOSIS — Z86718 Personal history of other venous thrombosis and embolism: Secondary | ICD-10-CM

## 2019-09-04 DIAGNOSIS — R0600 Dyspnea, unspecified: Secondary | ICD-10-CM | POA: Diagnosis not present

## 2019-09-04 DIAGNOSIS — E785 Hyperlipidemia, unspecified: Secondary | ICD-10-CM

## 2019-09-04 DIAGNOSIS — R002 Palpitations: Secondary | ICD-10-CM

## 2019-09-04 DIAGNOSIS — R0609 Other forms of dyspnea: Secondary | ICD-10-CM

## 2019-09-04 NOTE — Patient Instructions (Addendum)
Medication Instructions:  Your physician recommends that you continue on your current medications as directed. Please refer to the Current Medication list given to you today.  *If you need a refill on your cardiac medications before your next appointment, please call your pharmacy*  Lab Work: Your physician recommends that you return for a FASTING lipid profile  If you have labs (blood work) drawn today and your tests are completely normal, you will receive your results only by: Marland Kitchen MyChart Message (if you have MyChart) OR . A paper copy in the mail If you have any lab test that is abnormal or we need to change your treatment, we will call you to review the results.  Testing/Procedures: Your physician has requested that you have a lexiscan myoview. For further information please visit [email or web address]. Please follow instruction sheet, as given.   Follow-Up: At [facility], you and your health needs are our priority.  As part of our continuing mission to provide you with exceptional heart care, we have created designated Provider Care Teams.  These Care Teams include your primary Cardiologist (physician) and Advanced Practice Providers (APPs -  Physician Assistants and Nurse Practitioners) who all work together to provide you with the care you need, when you need it.  Your next appointment:   After Stress Test -  The format for your next appointment:   Either In Person or Virtual  Provider:   Ermalinda Barrios, PA-C  Other Instructions  We are recommending the COVID-19 vaccine to all of our patients. Cardiac medications (including blood thinners) should not deter anyone from being vaccinated and there is no need to hold any of those medications prior to vaccine administration.   Currently, there is a hotline to call (active 08/03/19) to schedule vaccination appointments as no walk-ins will be accepted.    Vaccines through the health department can be arranged by calling [phone]     Vaccines through [facility] can be arranged by calling [phone] or visiting [email or web address]   If you have further questions or concerns about the vaccine process, please visit [email or web address], [email or web address], or contact your primary care physician.

## 2019-09-19 ENCOUNTER — Encounter: Payer: Self-pay | Admitting: Family Medicine

## 2019-09-19 ENCOUNTER — Telehealth (HOSPITAL_COMMUNITY): Payer: Self-pay | Admitting: *Deleted

## 2019-09-19 DIAGNOSIS — T502X5D Adverse effect of carbonic-anhydrase inhibitors, benzothiadiazides and other diuretics, subsequent encounter: Secondary | ICD-10-CM

## 2019-09-19 NOTE — Telephone Encounter (Signed)
Patient given detailed instructions per Myocardial Perfusion Study Information Sheet for the test on 09/24/19 at 8:15. Patient notified to arrive 15 minutes early and that it is imperative to arrive on time for appointment to keep from having the test rescheduled.  If you need to cancel or reschedule your appointment, please call the office within 24 hours of your appointment. . Patient verbalized understanding.Ashley Castaneda

## 2019-09-20 NOTE — Telephone Encounter (Signed)
I added BMP to her labs for Monday at cardiology.   Her Xanax was sent in for 6 months to [facility] by [facility].  Because this is controlled cannot send in early unless get verification by Lurena Joiner that this was not sent.

## 2019-09-24 ENCOUNTER — Other Ambulatory Visit: Payer: Medicare Other | Admitting: *Deleted

## 2019-09-24 ENCOUNTER — Other Ambulatory Visit: Payer: Self-pay

## 2019-09-24 ENCOUNTER — Ambulatory Visit (HOSPITAL_COMMUNITY): Payer: Medicare Other | Attending: Cardiology

## 2019-09-24 DIAGNOSIS — Z86718 Personal history of other venous thrombosis and embolism: Secondary | ICD-10-CM | POA: Diagnosis not present

## 2019-09-24 DIAGNOSIS — R0609 Other forms of dyspnea: Secondary | ICD-10-CM

## 2019-09-24 DIAGNOSIS — E785 Hyperlipidemia, unspecified: Secondary | ICD-10-CM

## 2019-09-24 DIAGNOSIS — R0600 Dyspnea, unspecified: Secondary | ICD-10-CM | POA: Insufficient documentation

## 2019-09-24 DIAGNOSIS — R002 Palpitations: Secondary | ICD-10-CM | POA: Diagnosis not present

## 2019-09-24 LAB — LIPID PANEL
Chol/HDL Ratio: 3.4 ratio (ref 0.0–4.4)
Cholesterol, Total: 208 mg/dL — ABNORMAL HIGH (ref 100–199)
HDL: 62 mg/dL (ref >39)
LDL Chol Calc (NIH): 124 mg/dL — ABNORMAL HIGH (ref 0–99)
Triglycerides: 127 mg/dL (ref 0–149)
VLDL Cholesterol Cal: 22 mg/dL (ref 5–40)

## 2019-09-24 MED ORDER — TECHNETIUM TC 99M TETROFOSMIN IV KIT
32.5000 | PACK | Freq: Once | INTRAVENOUS | Status: AC | PRN
  Administered 2019-09-24: 32.5 via INTRAVENOUS
  Filled 2019-09-24: qty 33

## 2019-09-24 MED ORDER — REGADENOSON 0.4 MG/5ML IV SOLN
0.4000 mg | Freq: Once | INTRAVENOUS | Status: AC
  Administered 2019-09-24: 0.4 mg via INTRAVENOUS

## 2019-09-25 ENCOUNTER — Ambulatory Visit (HOSPITAL_COMMUNITY): Payer: Medicare Other | Attending: Cardiology

## 2019-09-25 ENCOUNTER — Telehealth: Payer: Self-pay

## 2019-09-25 ENCOUNTER — Other Ambulatory Visit: Payer: Self-pay

## 2019-09-25 DIAGNOSIS — E785 Hyperlipidemia, unspecified: Secondary | ICD-10-CM

## 2019-09-25 LAB — MYOCARDIAL PERFUSION IMAGING
LV dias vol: 59 mL (ref 46–106)
LV sys vol: 12 mL
Peak HR: 118 {beats}/min
Rest HR: 76 {beats}/min
SDS: 0
SRS: 0
SSS: 0
TID: 0.96

## 2019-09-25 MED ORDER — TECHNETIUM TC 99M TETROFOSMIN IV KIT
31.3000 | PACK | Freq: Once | INTRAVENOUS | Status: AC | PRN
  Administered 2019-09-25: 31.3 via INTRAVENOUS
  Filled 2019-09-25: qty 32

## 2019-09-25 MED ORDER — ATORVASTATIN CALCIUM 10 MG PO TABS: 10.0000 mg | ORAL_TABLET | Freq: Every day | ORAL | 3 refills | Status: DC

## 2019-09-25 NOTE — Telephone Encounter (Signed)
-----   Message from Imogene Burn, PA-C sent at 09/25/2019  7:45 AM EST ----- LDL is high at 124, goal less than 90. Can try exercise and weight loss if she's committed, otherwise start lipitor 10 mg daily with repeat lipids and LFT's in 3 months. thanks

## 2019-09-26 ENCOUNTER — Telehealth: Payer: Self-pay | Admitting: Family Medicine

## 2019-09-26 MED ORDER — ALPRAZOLAM 0.5 MG PO TABS: 0.5000 mg | ORAL_TABLET | Freq: Two times a day (BID) | ORAL | 0 refills | Status: DC
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
## 2019-09-26 NOTE — Telephone Encounter (Signed)
Please advise 

## 2019-09-27 DIAGNOSIS — Z23 Encounter for immunization: Secondary | ICD-10-CM | POA: Diagnosis not present

## 2019-10-01 ENCOUNTER — Telehealth: Payer: Self-pay

## 2019-10-01 MED ORDER — ALPRAZOLAM 0.5 MG PO TABS: 0.5000 mg | ORAL_TABLET | Freq: Two times a day (BID) | ORAL | 0 refills | Status: DC
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
## 2019-10-01 NOTE — Addendum Note (Signed)
Addended by: Eulas Post on: 10/01/2019 08:56 AM   Modules accepted: Orders

## 2019-10-01 NOTE — Telephone Encounter (Signed)
Faxed refill to [facility] for Xanax since not able to send by escribe. Confirmation received.

## 2019-10-01 NOTE — Progress Notes (Signed)
Virtual Visit via Telephone Note   This visit type was conducted due to national recommendations for restrictions regarding the COVID-19 Pandemic (e.g. social distancing) in an effort to limit this patient's exposure and mitigate transmission in our community.  Due to her co-morbid illnesses, this patient is at least at moderate risk for complications without adequate follow up.  This format is felt to be most appropriate for this patient at this time.  The patient did not have access to video technology/had technical difficulties with video requiring transitioning to audio format only (telephone).  All issues noted in this document were discussed and addressed.  No physical exam could be performed with this format.  Please refer to the patient's chart for her  consent to telehealth for [facility].   The patient was identified using 2 identifiers.  Date:  10/02/2019   ID:  Dionicia Abler, DOB [birth year], MRN [id number]  Patient Location: Home Provider Location: Home  PCP:  Eulas Post, MD  Cardiologist:  Larae Grooms, MD  Electrophysiologist:  None   Evaluation Performed:  Follow-Up Visit  Chief Complaint:  F/u  History of Present Illness:    ARBUTUS OXENREIDER is a 69 y.o. female with history of multiple DVT not on anticoagulation, fibromyalgia, chronic back pain, autonomic dysfunction, stimulator placed 06/2017 but persistent pain.  She saw Dr. Irish Lack 08/2017 for palpitations.  Holter monitor 08/2017 normal sinus rhythm with PACs and PVCs symptoms did not correlate with ectopic beats.   Patient saw Dr. Elease Hashimoto 08/29/2019 complaining of increased shortness of breath with exertion such as stairs over the past year.  Occasional chest tightness.  Was felt some of her symptoms could be secondary to deconditioning and COPD but cardiac work-up recommended.  I saw the patient 09/04/2019 which time I felt her chest pain and exertional dyspnea could be secondary to deconditioning and  COPD but did order Lexiscan to rule out cardiac source.  Lexiscan Myoview 09/25/2019 normal study no ischemia LVEF 80%.  Weight loss and exercise recommended.  Patient still having DOE and thinks it may be due to her COPD. Trying to change her diet. Saw a pulmonary doctor 15 yrs ago but doesn't remember. doesn't want to take lipitor but try diet first. Got her first covid19 vaccine.   The patient does not have symptoms concerning for COVID-19 infection (fever, chills, cough, or new shortness of breath).    Past Medical History:  Diagnosis Date  . Adenomatous colon polyp 09/2008  . ALLERGIC RHINITIS 07/01/2008  . ANXIETY 07/01/2008  . BACK PAIN, THORACIC REGION 03/04/2010  . BRONCHITIS, CHRONIC 07/01/2008  . CARPAL TUNNEL SYNDROME, HX OF 07/01/2008  . CHRONIC OBSTRUCTIVE PULMONARY DISEASE, ACUTE EXACERBATION 08/07/2010  . DEPRESSION 07/01/2008  . DVT, HX OF 07/01/2008  . ECZEMA 05/29/2009  . FACIAL PAIN 12/25/2009  . FIBROMYALGIA 07/01/2008  . GERD 07/01/2008  . Hiatal hernia   . Hyperlipidemia   . LEG PAIN, BILATERAL 07/01/2008  . MIGRAINES, HX OF 07/01/2008  . OCCIPITAL NEURALGIA 07/01/2008  . OSTEOARTHRITIS 07/01/2008  . Other specified trigeminal nerve disorders 07/01/2008  . REFLEX SYMPATHETIC DYSTROPHY 07/01/2008  . Trigeminal neuralgia 05/21/2009  . Unspecified vitamin D deficiency 03/31/2010   Past Surgical History:  Procedure Laterality Date  . CARPAL TUNNEL RELEASE  2009   RIGHT  . HEAD CRANIECTOMY  1994   MICROVASULAR DECOMPRESSION  . LAVH     BSO  . LEFT KNEE SURGERY  1989  . OCCIPITAL NERVE STIMULATOR INSERTION    .  RIGHT FOOT BONE SPUR  1987  . RIGHT FOOT SURGERY  1998   BAD CUT  . RIGHT JAW SURGERY  2002  . RIGHT KNEE SURGERY  1990  . [place]  . RIGHT THUMB SUGERY  2004  . TONSILLECTOMY  1957     Current Meds  Medication Sig  . ALPRAZolam (XANAX) 0.5 MG tablet Take 1 tablet (0.5 mg total) by mouth 2 (two) times daily.  Marland Kitchen ascorbic  acid (VITAMIN C) 1000 MG tablet Take 1,000 mg by mouth daily.  Marland Kitchen aspirin 325 MG tablet daily.  . calcium carbonate (OS-CAL - DOSED IN MG OF ELEMENTAL CALCIUM) 1250 MG tablet Take 1 tablet by mouth daily.  . carboxymethylcellulose (REFRESH PLUS) 0.5 % SOLN Take 1 drop by mouth daily as needed. For dry eyes  . cetirizine (ZYRTEC) 10 MG tablet Take 10 mg by mouth daily.    . colchicine 0.6 MG tablet Take 1 tablet (0.6 mg total) by mouth daily.  . cycloSPORINE (RESTASIS) 0.05 % ophthalmic emulsion Place 1 drop into both eyes 2 (two) times daily.  Marland Kitchen dexamethasone 0.5 MG/5ML elixir Take two teaspoons and swish, gargle, and spit qid prn canker sores.  Marland Kitchen dronabinol (MARINOL) 5 MG capsule dronabinol 5 mg capsule  TAKE ONE CAPSULE BY MOUTH TWICE A DAY  . fluticasone (FLONASE) 50 MCG/ACT nasal spray Place 2 sprays into both nostrils daily as needed. For nasal congestion  . frovatriptan (FROVA) 2.5 MG tablet Take 2.5 mg by mouth as needed. If recurs, may repeat after 2 hours. Max of 3 tabs in 24 hours. For migraines.  . Galcanezumab-gnlm 120 MG/ML SOSY Inject into the skin.  . hydrOXYzine (ATARAX) 10 MG tablet Take 10 mg by mouth every 8 (eight) hours as needed. For dizziness per Renaldo Reel, MD  . lidocaine (LIDODERM) 5 % Place 1 patch onto the skin daily. Remove & Discard patch within 12 hours or as directed by MD  . meclizine (ANTIVERT) 25 MG tablet TAKE 1 TABLET BY MOUTH 4 TIMES A DAY AS NEEDED FOR DIZZINESS  . meloxicam (MOBIC) 15 MG tablet Take 1 tablet (15 mg total) by mouth daily.  . metaxalone (SKELAXIN) 800 MG tablet Take 800 mg by mouth 3 (three) times daily.    . metoclopramide (REGLAN) 10 MG tablet Take 10 mg by mouth 3 (three) times daily as needed. For nausea  . montelukast (SINGULAIR) 10 MG tablet TAKE 1 TABLET (10 MG TOTAL) BY MOUTH DAILY AT 6 PM.  . Multiple Vitamin (MULTIVITAMIN) tablet Take 1 tablet by mouth every morning.   . Omega-3 Fatty Acids (FISH OIL) 1200 MG CAPS Take 1 capsule by  mouth every evening.  . potassium chloride (K-DUR) 10 MEQ tablet Take two tablets once daily  . RABEprazole (ACIPHEX) 20 MG tablet Take 1 tablet (20 mg total) by mouth daily.  . silodosin (RAPAFLO) 8 MG CAPS capsule Take 1 capsule (8 mg total) by mouth daily with breakfast.  . tiZANidine (ZANAFLEX) 2 MG tablet Take 2 mg by mouth at bedtime.  . torsemide (DEMADEX) 10 MG tablet Take 1 tablet (10 mg total) by mouth daily.  . vitamin E 400 UNIT capsule Take 400 Units by mouth daily at 6 PM.   . [DISCONTINUED] atorvastatin (LIPITOR) 10 MG tablet Take 1 tablet (10 mg total) by mouth daily.     Allergies:   Cefazolin, Baclofen, Carbamazepine, Duloxetine, Fentanyl, Gabapentin, Naproxen, Oxcarbazepine, Pregabalin, Serotonin reuptake inhibitors (ssris), Topiramate, Venlafaxine, Zonisamide, Ceclor [  cefaclor], Milnacipran, and Ondansetron   Social History   Tobacco Use  . Smoking status: Former Smoker    Packs/day: 2.00    Years: 30.00    Pack years: 60.00    Types: Cigarettes    Quit date: 02/04/2012    Years since quitting: 7.6  . Smokeless tobacco: Current User  . Tobacco comment: nicotine in vapor and is very low nicotine   Substance Use Topics  . Alcohol use: No    Alcohol/week: 0.0 standard drinks    Comment: quit 2000  . Drug use: No     Family Hx: The patient's family history includes Heart attack in her father and maternal grandmother; Hypertension in her father; Ovarian cancer (age of onset: 17) in her mother; Prostate cancer in her father; Stroke in her paternal grandmother.  ROS:   Please see the history of present illness.      All other systems reviewed and are negative.   Prior CV studies:   The following studies were reviewed today:  Lexiscan 3/2/2021Study Highlights   Nuclear stress EF: 80%.  There was no ST segment deviation noted during stress.  The study is normal.  This is a low risk study.  The left ventricular ejection fraction is hyperdynamic (>65%).       Holter monitor 2/2019Normal sinus rhythm with occasional PACs and PVCs.  No pathologic arrhtyhmias.  Symptoms did not typically correlate with the ectopic beats.   NST 2014 Impression Exercise Capacity:  Lexiscan with no exercise. BP Response:  Normal blood pressure response. Clinical Symptoms:  No significant symptoms noted. ECG Impression:  No significant ST segment change suggestive of ischemia. Comparison with Prior Nuclear Study: No previous nuclear study performed   Overall Impression:  Normal stress nuclear study.   LV Wall Motion:  NL LV Function; NL Wall Motion       Labs/Other Tests and Data Reviewed:    EKG:  No ECG reviewed.  Recent Labs: 05/06/2019: BUN 25; Creatinine, Ser 1.27; Hemoglobin 13.4; Platelets 320; Potassium 3.9; Sodium 137   Recent Lipid Panel Lab Results  Component Value Date/Time   CHOL 208 (H) 09/24/2019 08:59 AM   TRIG 127 09/24/2019 08:59 AM   HDL 62 09/24/2019 08:59 AM   CHOLHDL 3.4 09/24/2019 08:59 AM   CHOLHDL 4 06/14/2013 11:07 AM   LDLCALC 124 (H) 09/24/2019 08:59 AM   LDLDIRECT 149.8 06/14/2013 11:07 AM    Wt Readings from Last 3 Encounters:  09/24/19 245 lb (111.1 kg)  09/04/19 245 lb 12.8 oz (111.5 kg)  08/29/19 235 lb (106.6 kg)     Objective:    Vital Signs:  Ht 5\' 3"  (1.6 m)   LMP 07/26/1992   BMI 43.40 kg/m    VITAL SIGNS:  reviewed  ASSESSMENT & PLAN:    Exertional dyspnea and chest pain most likely secondary to deconditioning and COPD Lexiscan Myoview 09/25/2018 normal LVEF 80%.  Recommend diet and exercise. With COPD will refer back to pulmonary   History of palpitations Holter monitor 08/2017 normal sinus rhythm with PACs and PVCs-no recurrence.   History of recurrent DVTs-on ASA 325 mg    Hyperlipidemia LDL 124 she wants to try diet before she takes a statin. Will recheck FLP in 6 months   Obesity-weight loss recommended. She'd like to try slim fast again.offered refferal to weight loss center but she  declined.   COVID-19 Education: The signs and symptoms of COVID-19 were discussed with the patient and how to seek care for  testing (follow up with PCP or arrange E-visit).   The importance of social distancing was discussed today.  Time:   Today, I have spent 15:20 minutes with the patient with telehealth technology discussing the above problems.     Medication Adjustments/Labs and Tests Ordered: Current medicines are reviewed at length with the patient today.  Concerns regarding medicines are outlined above.   Tests Ordered: Orders Placed This Encounter  Procedures  . Lipid Profile  . Ambulatory referral to Pulmonology    Medication Changes: No orders of the defined types were placed in this encounter.   Follow Up:  Either In Person or Virtual in 1 year(s) Dr. Irish Lack  Signed, Ermalinda Barrios, PA-C  10/02/2019 1:15 PM    [place]

## 2019-10-02 ENCOUNTER — Telehealth (INDEPENDENT_AMBULATORY_CARE_PROVIDER_SITE_OTHER): Payer: Medicare Other | Admitting: Physician Assistant

## 2019-10-02 ENCOUNTER — Other Ambulatory Visit: Payer: Self-pay

## 2019-10-02 ENCOUNTER — Encounter: Payer: Self-pay | Admitting: Physician Assistant

## 2019-10-02 VITALS — Ht 63.0 in

## 2019-10-02 DIAGNOSIS — Z86718 Personal history of other venous thrombosis and embolism: Secondary | ICD-10-CM | POA: Diagnosis not present

## 2019-10-02 DIAGNOSIS — R079 Chest pain, unspecified: Secondary | ICD-10-CM | POA: Diagnosis not present

## 2019-10-02 DIAGNOSIS — Z6841 Body Mass Index (BMI) 40.0 and over, adult: Secondary | ICD-10-CM | POA: Diagnosis not present

## 2019-10-02 DIAGNOSIS — E785 Hyperlipidemia, unspecified: Secondary | ICD-10-CM

## 2019-10-02 DIAGNOSIS — R002 Palpitations: Secondary | ICD-10-CM

## 2019-10-02 DIAGNOSIS — R0600 Dyspnea, unspecified: Secondary | ICD-10-CM

## 2019-10-02 DIAGNOSIS — R0609 Other forms of dyspnea: Secondary | ICD-10-CM

## 2019-10-02 DIAGNOSIS — Z87891 Personal history of nicotine dependence: Secondary | ICD-10-CM | POA: Diagnosis not present

## 2019-10-02 NOTE — Patient Instructions (Addendum)
Medication Instructions:  Your physician recommends that you continue on your current medications as directed. Please refer to the Current Medication list given to you today.  *If you need a refill on your cardiac medications before your next appointment, please call your pharmacy*   Lab Work: Your physician recommends that you return for lab work in: 6 months--lipid profile. This will be fasting--Scheduled for September 8,2021  If you have labs (blood work) drawn today and your tests are completely normal, you will receive your results only by: Marland Kitchen MyChart Message (if you have MyChart) OR . A paper copy in the mail If you have any lab test that is abnormal or we need to change your treatment, we will call you to review the results.   Testing/Procedures: none   Follow-Up: At [facility], you and your health needs are our priority.  As part of our continuing mission to provide you with exceptional heart care, we have created designated Provider Care Teams.  These Care Teams include your primary Cardiologist (physician) and Advanced Practice Providers (APPs -  Physician Assistants and Nurse Practitioners) who all work together to provide you with the care you need, when you need it.  We recommend signing up for the patient portal called "MyChart".  Sign up information is provided on this After Visit Summary.  MyChart is used to connect with patients for Virtual Visits (Telemedicine).  Patients are able to view lab/test results, encounter notes, upcoming appointments, etc.  Non-urgent messages can be sent to your provider as well.   To learn more about what you can do with MyChart, go to [email or web address].    Your next appointment:   12 month(s)  The format for your next appointment:   In Person or virtual  Provider:   You may see Larae Grooms, MD or one of the following Advanced Practice Providers on your designated Care Team:    Melina Copa, PA-C  Ermalinda Barrios,  PA-C    Other Instructions You have been referred to [facility] pulmonary

## 2019-10-04 ENCOUNTER — Telehealth: Payer: Self-pay | Admitting: Family Medicine

## 2019-10-04 ENCOUNTER — Encounter: Payer: Self-pay | Admitting: Family Medicine

## 2019-10-04 ENCOUNTER — Telehealth (INDEPENDENT_AMBULATORY_CARE_PROVIDER_SITE_OTHER): Payer: Medicare Other | Admitting: Family Medicine

## 2019-10-04 VITALS — Temp 97.4°F | Wt 243.0 lb

## 2019-10-04 DIAGNOSIS — R21 Rash and other nonspecific skin eruption: Secondary | ICD-10-CM

## 2019-10-04 MED ORDER — VALACYCLOVIR HCL 1 G PO TABS: 1000.0000 mg | ORAL_TABLET | Freq: Three times a day (TID) | ORAL | 0 refills | Status: DC

## 2019-10-04 NOTE — Telephone Encounter (Signed)
Routed to Dr Maudie Mercury as the pt has an appt today at 3:40pm.

## 2019-10-04 NOTE — Telephone Encounter (Signed)
Pt stated she has shingles again based on the rash she has on her right bottom check is similar to the one she had on her face when she was treated for shingles previously. Pt is wondering if VALTRAX can be called in today cause Urgent Care called that prescription in last time for her?   Informed pt that [facility] does not work Thursdays. Pt asked if another provider can call in it.   Pharmacy: [facility] [place]  [phone]   Pt can be reached at [phone] she would like a call within an hour if possible regarding if it can be done today or not.

## 2019-10-04 NOTE — Telephone Encounter (Signed)
Spoke with patient. Patient reports she is having another shingles outbreak this is her 5th-7th time having an shingles outbreak in the same area. Patient reports last shingles outbreak was in December. Patient scheduled for virtual visit with Dr. Maudie Mercury at 3:40 PM.

## 2019-10-04 NOTE — Progress Notes (Signed)
Virtual Visit via Video Note  I connected with Ashley Castaneda  on 10/04/19 at  3:40 PM EST by a video enabled telemedicine application and verified that I am speaking with the correct person using two identifiers.  Location patient: home, [place] Location provider:work or home office Persons participating in the virtual visit: patient, provider  I discussed the limitations of evaluation and management by telemedicine and the availability of in person appointments. The patient expressed understanding and agreed to proceed.   HPI:  Acute visit for a rash: -started last night -painful rash only on the R buttock, had pain before the rash started -vesicular rash, she has had shingles in this area several times in the past -feels the same as shingles she has had in the past -reports she actually saw ID about this in the past because of of recurrent issues, confirmed on viral culture  ROS: See pertinent positives and negatives per HPI.  Past Medical History:  Diagnosis Date  . Adenomatous colon polyp 09/2008  . ALLERGIC RHINITIS 07/01/2008  . ANXIETY 07/01/2008  . BACK PAIN, THORACIC REGION 03/04/2010  . BRONCHITIS, CHRONIC 07/01/2008  . CARPAL TUNNEL SYNDROME, HX OF 07/01/2008  . CHRONIC OBSTRUCTIVE PULMONARY DISEASE, ACUTE EXACERBATION 08/07/2010  . DEPRESSION 07/01/2008  . DVT, HX OF 07/01/2008  . ECZEMA 05/29/2009  . FACIAL PAIN 12/25/2009  . FIBROMYALGIA 07/01/2008  . GERD 07/01/2008  . Hiatal hernia   . Hyperlipidemia   . LEG PAIN, BILATERAL 07/01/2008  . MIGRAINES, HX OF 07/01/2008  . OCCIPITAL NEURALGIA 07/01/2008  . OSTEOARTHRITIS 07/01/2008  . Other specified trigeminal nerve disorders 07/01/2008  . REFLEX SYMPATHETIC DYSTROPHY 07/01/2008  . Trigeminal neuralgia 05/21/2009  . Unspecified vitamin D deficiency 03/31/2010    Past Surgical History:  Procedure Laterality Date  . CARPAL TUNNEL RELEASE  2009   RIGHT  . HEAD CRANIECTOMY  1994   MICROVASULAR DECOMPRESSION  . LAVH     BSO  . LEFT KNEE  SURGERY  1989  . OCCIPITAL NERVE STIMULATOR INSERTION    . RIGHT FOOT BONE SPUR  1987  . RIGHT FOOT SURGERY  1998   BAD CUT  . RIGHT JAW SURGERY  2002  . RIGHT KNEE SURGERY  1990  . [place]  . RIGHT THUMB SUGERY  2004  . TONSILLECTOMY  1957    Family History  Problem Relation Age of Onset  . Ovarian cancer Mother 54  . Hypertension Father   . Prostate cancer Father   . Heart attack Father   . Heart attack Maternal Grandmother   . Stroke Paternal Grandmother     SOCIAL HX: see hpi  Current Outpatient Medications:  .  ALPRAZolam (XANAX) 0.5 MG tablet, Take 1 tablet (0.5 mg total) by mouth 2 (two) times daily., Disp: 180 tablet, Rfl: 0 .  ascorbic acid (VITAMIN C) 1000 MG tablet, Take 1,000 mg by mouth daily., Disp: , Rfl:  .  aspirin 325 MG tablet, daily., Disp: , Rfl:  .  calcium carbonate (OS-CAL - DOSED IN MG OF ELEMENTAL CALCIUM) 1250 MG tablet, Take 1 tablet by mouth daily., Disp: , Rfl:  .  carboxymethylcellulose (REFRESH PLUS) 0.5 % SOLN, Take 1 drop by mouth daily as needed. For dry eyes, Disp: , Rfl:  .  cetirizine (ZYRTEC) 10 MG tablet, Take 10 mg by mouth daily.  , Disp: , Rfl:  .  colchicine 0.6 MG tablet, Take 1 tablet (0.6 mg total) by mouth daily., Disp: 90 tablet,  Rfl: 3 .  cycloSPORINE (RESTASIS) 0.05 % ophthalmic emulsion, Place 1 drop into both eyes 2 (two) times daily., Disp: , Rfl:  .  dexamethasone 0.5 MG/5ML elixir, Take two teaspoons and swish, gargle, and spit qid prn canker sores., Disp: 237 mL, Rfl: 1 .  dronabinol (MARINOL) 5 MG capsule, dronabinol 5 mg capsule  TAKE ONE CAPSULE BY MOUTH TWICE A DAY, Disp: , Rfl:  .  fluticasone (FLONASE) 50 MCG/ACT nasal spray, Place 2 sprays into both nostrils daily as needed. For nasal congestion, Disp: 16 g, Rfl: 3 .  frovatriptan (FROVA) 2.5 MG tablet, Take 2.5 mg by mouth as needed. If recurs, may repeat after 2 hours. Max of 3 tabs in 24 hours. For migraines., Disp: , Rfl:  .   Galcanezumab-gnlm 120 MG/ML SOSY, Inject into the skin., Disp: , Rfl:  .  hydrOXYzine (ATARAX) 10 MG tablet, Take 10 mg by mouth every 8 (eight) hours as needed. For dizziness per Renaldo Reel, MD, Disp: , Rfl:  .  lidocaine (LIDODERM) 5 %, Place 1 patch onto the skin daily. Remove & Discard patch within 12 hours or as directed by MD, Disp: 30 patch, Rfl: 1 .  meclizine (ANTIVERT) 25 MG tablet, TAKE 1 TABLET BY MOUTH 4 TIMES A DAY AS NEEDED FOR DIZZINESS, Disp: 120 tablet, Rfl: 1 .  meloxicam (MOBIC) 15 MG tablet, Take 1 tablet (15 mg total) by mouth daily., Disp: 90 tablet, Rfl: 3 .  metaxalone (SKELAXIN) 800 MG tablet, Take 800 mg by mouth 3 (three) times daily.  , Disp: , Rfl:  .  metoclopramide (REGLAN) 10 MG tablet, Take 10 mg by mouth 3 (three) times daily as needed. For nausea, Disp: , Rfl:  .  montelukast (SINGULAIR) 10 MG tablet, TAKE 1 TABLET (10 MG TOTAL) BY MOUTH DAILY AT 6 PM., Disp: 90 tablet, Rfl: 3 .  Multiple Vitamin (MULTIVITAMIN) tablet, Take 1 tablet by mouth every morning. , Disp: , Rfl:  .  Omega-3 Fatty Acids (FISH OIL) 1200 MG CAPS, Take 1 capsule by mouth every evening., Disp: , Rfl:  .  potassium chloride (K-DUR) 10 MEQ tablet, Take two tablets once daily, Disp: 90 tablet, Rfl: 3 .  RABEprazole (ACIPHEX) 20 MG tablet, Take 1 tablet (20 mg total) by mouth daily., Disp: 90 tablet, Rfl: 3 .  silodosin (RAPAFLO) 8 MG CAPS capsule, Take 1 capsule (8 mg total) by mouth daily with breakfast., Disp: 90 capsule, Rfl: 3 .  tiZANidine (ZANAFLEX) 2 MG tablet, Take 2 mg by mouth at bedtime., Disp: , Rfl:  .  torsemide (DEMADEX) 10 MG tablet, Take 1 tablet (10 mg total) by mouth daily., Disp: 90 tablet, Rfl: 3 .  vitamin E 400 UNIT capsule, Take 400 Units by mouth daily at 6 PM. , Disp: , Rfl:  .  valACYclovir (VALTREX) 1000 MG tablet, Take 1 tablet (1,000 mg total) by mouth 3 (three) times daily., Disp: 21 tablet, Rfl: 0  EXAM:  VITALS per patient if applicable:  GENERAL: alert,  oriented, appears well and in no acute distress  HEENT: atraumatic, conjunttiva clear, no obvious abnormalities on inspection of external nose and ears  NECK: normal movements of the head and neck  LUNGS: on inspection no signs of respiratory distress, breathing rate appears normal, no obvious gross SOB, gasping or wheezing  CV: no obvious cyanosis  SKIN: sensitive area, not examined, pt tried to send picture - somewhat blurred. She reports is vesicular and does appear to possibly be so in  the image.  MS: moves all visible extremities without noticeable abnormality  PSYCH/NEURO: pleasant and cooperative, no obvious depression or anxiety, speech and thought processing grossly intact  ASSESSMENT AND PLAN:  Discussed the following assessment and plan:  Rash  -we discussed possible serious and likely etiologies, options for evaluation and workup, limitations of telemedicine visit vs in person visit, treatment, treatment risks and precautions. Pt prefers to treat via telemedicine empirically with Valtrex  rather then risking or undertaking an in person visit at this moment. She is anxious to get this started as soon as possible as has had issues in the past. Patient agrees to seek prompt in person care if worsening, new symptoms arise, or if is not improving with treatment.   I discussed the assessment and treatment plan with the patient. The patient was provided an opportunity to ask questions and all were answered. The patient agreed with the plan and demonstrated an understanding of the instructions.   The patient was advised to call back or seek an in-person evaluation if the symptoms worsen or if the condition fails to improve as anticipated.   Lucretia Kern, DO

## 2019-10-17 ENCOUNTER — Encounter: Payer: Self-pay | Admitting: Pulmonary Disease

## 2019-10-17 ENCOUNTER — Ambulatory Visit (INDEPENDENT_AMBULATORY_CARE_PROVIDER_SITE_OTHER): Payer: Medicare Other | Admitting: Pulmonary Disease

## 2019-10-17 ENCOUNTER — Other Ambulatory Visit: Payer: Self-pay

## 2019-10-17 DIAGNOSIS — R0609 Other forms of dyspnea: Secondary | ICD-10-CM | POA: Insufficient documentation

## 2019-10-17 DIAGNOSIS — R0600 Dyspnea, unspecified: Secondary | ICD-10-CM

## 2019-10-17 MED ORDER — ALBUTEROL SULFATE HFA 108 (90 BASE) MCG/ACT IN AERS: 2.0000 | INHALATION_SPRAY | Freq: Four times a day (QID) | RESPIRATORY_TRACT | 3 refills | Status: DC | PRN

## 2019-10-17 MED ORDER — BREO ELLIPTA 100-25 MCG/INH IN AEPB: 1.0000 | INHALATION_SPRAY | Freq: Every day | RESPIRATORY_TRACT | 0 refills | Status: DC

## 2019-10-17 NOTE — Assessment & Plan Note (Signed)
She does not want to undertake PFTs due to worsening of her trigeminal neuralgia Hence we will undertake empiric treatment trial  sample of Breo 100-1 puff daily, call me for prescription if this works.  Rinse mouth after use Prescription for albuterol MDI 2 puffs every 6 hours as needed for shortness of breath  If these medicines do not work, then we will proceed with PFts and formal allergy testing

## 2019-10-17 NOTE — Assessment & Plan Note (Signed)
Alternative explanations for severe dyspnea includes weight gain and deconditioning Very unlikely that this is VTE since her symptoms have been going on for a year. Pink cessation was also emphasized

## 2019-10-17 NOTE — Progress Notes (Signed)
Subjective:    Patient ID: Ashley Castaneda, female    DOB: 26-Aug-1950, 69 y.o.   MRN: [id number]  HPI  Chief Complaint  Patient presents with  . Consult    Patient is here for shortness of breath with exertion walking, going upstairs, etc Patient has tightness in chest when she gets short of breath. Patient had stress test and came back good.   69 year old ex-smoker presents for evaluation of shortness of breath which has been ongoing for a year. She reports dyspnea on working in the yard or climbing the stairs in her house or even wearing a mask.  She denies frequent chest colds .  There is no history of pedal edema or orthopnea or paroxysmal nocturnal dyspnea Cardiac evaluation for chest pain and exertional dyspnea : Lexiscan Myoview 09/2019 was normal with LVEF 80% .  Holter 08/2017 showed normal sinus rhythm with PACs and PVCs, has a history of palpitations  She does admit to significant stress since her husband , a veteran who has ALS for 20 years is now starting to deteriorate.  He also has COPD and continues to smoke so she is exposed to passive smoking.  She has used her husband's Symbicort and ProAir on occasion and this seems to help. She reports doing a PFT more than 10 years ago but this made her neuralgia significantly worse.  I am unable to find any records of old PFTs  She smoked starting as a teenager and buttock time she quit in 2013 she was up to 3 packs a day, more than 40 pack years.  She continues to vape and is transitioning to 0 nicotine vapor She reports significant allergies and uses Zyrtec and Singulair, she has seen an allergist in the remote past.  She has gained 40 pounds over the last 2 years   PMH-RLE DVT not on anticoagulation (while on premarin ), fibromyalgia, chronic back pain, autonomic dysfunction, stimulator placed 06/2017 but persistent pain, trigeminal and occipital neuralgia, canker sores , recurrent shingles  Chest x-ray from 04/2019 during ED visit  for chest pain was relieved which shows clear lungs mild hyperinflation no effusions  Past Medical History:  Diagnosis Date  . Adenomatous colon polyp 09/2008  . ALLERGIC RHINITIS 07/01/2008  . ANXIETY 07/01/2008  . BACK PAIN, THORACIC REGION 03/04/2010  . BRONCHITIS, CHRONIC 07/01/2008  . CARPAL TUNNEL SYNDROME, HX OF 07/01/2008  . CHRONIC OBSTRUCTIVE PULMONARY DISEASE, ACUTE EXACERBATION 08/07/2010  . DEPRESSION 07/01/2008  . DVT, HX OF 07/01/2008  . ECZEMA 05/29/2009  . FACIAL PAIN 12/25/2009  . FIBROMYALGIA 07/01/2008  . GERD 07/01/2008  . Hiatal hernia   . Hyperlipidemia   . LEG PAIN, BILATERAL 07/01/2008  . MIGRAINES, HX OF 07/01/2008  . OCCIPITAL NEURALGIA 07/01/2008  . OSTEOARTHRITIS 07/01/2008  . Other specified trigeminal nerve disorders 07/01/2008  . REFLEX SYMPATHETIC DYSTROPHY 07/01/2008  . Trigeminal neuralgia 05/21/2009  . Unspecified vitamin D deficiency 03/31/2010    Past Surgical History:  Procedure Laterality Date  . CARPAL TUNNEL RELEASE  2009   RIGHT  . HEAD CRANIECTOMY  1994   MICROVASULAR DECOMPRESSION  . LAVH     BSO  . LEFT KNEE SURGERY  1989  . OCCIPITAL NERVE STIMULATOR INSERTION    . RIGHT FOOT BONE SPUR  1987  . RIGHT FOOT SURGERY  1998   BAD CUT  . RIGHT JAW SURGERY  2002  . RIGHT KNEE SURGERY  1990  . [place]  .  RIGHT THUMB SUGERY  2004  . TONSILLECTOMY  1957    Allergies  Allergen Reactions  . Cefazolin Anaphylaxis, Swelling and Other (See Comments)    Ancef - in surgery, swelled up, turned blue, heart problems Turns Blue  . Baclofen Other (See Comments)    REACTION: confusion REACTION: confusion REACTION: confusion REACTION: confusion  . Carbamazepine Other (See Comments)    REACTION: confusion Other reaction(s): Confusion Confusion,Bad reactions to all seizure medicine  . Duloxetine Other (See Comments)  . Fentanyl Nausea And Vomiting and Other (See Comments)    REACTION: nausea and vomiting  .  Gabapentin Other (See Comments)    REACTION: confusion  . Naproxen Other (See Comments)    Elevates blood pressure  . Oxcarbazepine Other (See Comments)    REACTION: confusion Other reaction(s): Other REACTION: confusion REACTION: confusion REACTION: confusion   . Pregabalin Other (See Comments)    Other reaction(s): Other  . Serotonin Reuptake Inhibitors (Ssris) Other (See Comments)  . Topiramate Other (See Comments)    Other reaction(s): Unknown  . Venlafaxine Other (See Comments)    Other reaction(s): Unknown  . Zonisamide Other (See Comments)    Other reaction(s): Other  . Ceclor [Cefaclor]     hives  . Milnacipran Other (See Comments)  . Ondansetron Nausea And Vomiting    Nausea and vomiting after IV administration    Social History   Socioeconomic History  . Marital status: Married    Spouse name: [name]  . Number of children: 0  . Years of education: college-3  . Highest education level: Not on file  Occupational History  . Occupation: disabled  Tobacco Use  . Smoking status: Former Smoker    Packs/day: 2.00    Years: 30.00    Pack years: 60.00    Types: Cigarettes    Quit date: 02/04/2012    Years since quitting: 7.7  . Smokeless tobacco: Never Used  . Tobacco comment: nicotine in vapor and is very low nicotine   Substance and Sexual Activity  . Alcohol use: No    Alcohol/week: 0.0 standard drinks    Comment: quit 2000  . Drug use: No  . Sexual activity: Never  Other Topics Concern  . Not on file  Social History Narrative   Patient lives at home with husband [name].    Patient has no children and 2 step children.    Patient is on disability since 89 for arthalgia.    Patient has 3 years of college.    Patient is right handed.    Social Determinants of Health   Financial Resource Strain:   . Difficulty of Paying Living Expenses:   Food Insecurity:   . Worried About Charity fundraiser in the Last Year:   . Arboriculturist in the Last Year:     Transportation Needs:   . Film/video editor (Medical):   Marland Kitchen Lack of Transportation (Non-Medical):   Physical Activity:   . Days of Exercise per Week:   . Minutes of Exercise per Session:   Stress:   . Feeling of Stress :   Social Connections:   . Frequency of Communication with Friends and Family:   . Frequency of Social Gatherings with Friends and Family:   . Attends Religious Services:   . Active Member of Clubs or Organizations:   . Attends Archivist Meetings:   Marland Kitchen Marital Status:   Intimate Partner Violence:   . Fear of Current or Ex-Partner:   .  Emotionally Abused:   Marland Kitchen Physically Abused:   . Sexually Abused:      Family History  Problem Relation Age of Onset  . Ovarian cancer Mother 68  . Hypertension Father   . Prostate cancer Father   . Heart attack Father   . Heart attack Maternal Grandmother   . Stroke Paternal Grandmother     Review of Systems Constitutional: negative for anorexia, fevers and sweats  Eyes: negative for irritation, redness and visual disturbance  Ears, nose, mouth, throat, and face: negative for earaches, epistaxis, nasal congestion and sore throat  Respiratory: negative for cough, sputum and wheezing  Cardiovascular: negative for lower extremity edema, orthopnea, palpitations and syncope  Gastrointestinal: negative for abdominal pain, constipation, diarrhea, melena, nausea and vomiting  Genitourinary:negative for dysuria, frequency and hematuria  Hematologic/lymphatic: negative for bleeding, easy bruising and lymphadenopathy  Musculoskeletal:negative for arthralgias, muscle weakness and stiff joints  Neurological: negative for coordination problems, gait problems, headaches and weakness  Endocrine: negative for diabetic symptoms including polydipsia, polyuria and weight loss     Objective:   Physical Exam  Gen. Pleasant, obese, in no distress, normal affect ENT - no pallor,icterus, no post nasal drip, class 2-3  airway Neck: No JVD, no thyromegaly, no carotid bruits Lungs: no use of accessory muscles, no dullness to percussion, decreased without rales or rhonchi  Cardiovascular: Rhythm regular, heart sounds  normal, no murmurs or gallops, no peripheral edema Abdomen: soft and non-tender, no hepatosplenomegaly, BS normal. Musculoskeletal: No deformities, no cyanosis or clubbing Neuro:  alert, non focal, no tremors       Assessment & Plan:

## 2019-10-17 NOTE — Patient Instructions (Signed)
We will undertake treatment trial sample of Breo 100-1 puff daily, call me for prescription if this works.  Rinse mouth after use Prescription for albuterol MDI 2 puffs every 6 hours as needed for shortness of breath  If these medicines do not work, then we will proceed with breathing test

## 2019-10-22 ENCOUNTER — Telehealth: Payer: Self-pay | Admitting: Pulmonary Disease

## 2019-10-22 MED ORDER — BREO ELLIPTA 100-25 MCG/INH IN AEPB: 1.0000 | INHALATION_SPRAY | Freq: Every day | RESPIRATORY_TRACT | 3 refills | Status: DC

## 2019-10-22 NOTE — Telephone Encounter (Signed)
Called and spoke to pt. Pt states she would like a script for Breo 100, she has noticed an improvement in her breathing (see last OV). Rx sent to preferred pharmacy. Pt verbalized understanding and denied any further questions or concerns at this time.

## 2019-10-29 DIAGNOSIS — G44009 Cluster headache syndrome, unspecified, not intractable: Secondary | ICD-10-CM | POA: Diagnosis not present

## 2019-10-29 DIAGNOSIS — M5481 Occipital neuralgia: Secondary | ICD-10-CM | POA: Diagnosis not present

## 2019-10-29 DIAGNOSIS — G43719 Chronic migraine without aura, intractable, without status migrainosus: Secondary | ICD-10-CM | POA: Diagnosis not present

## 2019-10-29 DIAGNOSIS — R52 Pain, unspecified: Secondary | ICD-10-CM | POA: Diagnosis not present

## 2019-11-02 DIAGNOSIS — Z23 Encounter for immunization: Secondary | ICD-10-CM | POA: Diagnosis not present

## 2019-12-10 DIAGNOSIS — H2513 Age-related nuclear cataract, bilateral: Secondary | ICD-10-CM | POA: Diagnosis not present

## 2019-12-10 DIAGNOSIS — H532 Diplopia: Secondary | ICD-10-CM | POA: Diagnosis not present

## 2019-12-10 DIAGNOSIS — H04123 Dry eye syndrome of bilateral lacrimal glands: Secondary | ICD-10-CM | POA: Diagnosis not present

## 2019-12-17 ENCOUNTER — Ambulatory Visit (INDEPENDENT_AMBULATORY_CARE_PROVIDER_SITE_OTHER): Payer: Medicare Other | Admitting: Adult Health

## 2019-12-17 ENCOUNTER — Encounter: Payer: Self-pay | Admitting: Adult Health

## 2019-12-17 ENCOUNTER — Other Ambulatory Visit: Payer: Self-pay

## 2019-12-17 DIAGNOSIS — J449 Chronic obstructive pulmonary disease, unspecified: Secondary | ICD-10-CM | POA: Diagnosis not present

## 2019-12-17 NOTE — Progress Notes (Signed)
Virtual Visit via Telephone Note  I connected with Ashley Castaneda on 12/17/19 at 11:30 AM EDT by telephone and verified that I am speaking with the correct person using two identifiers.  Location: Patient: Home Provider: Home   I discussed the limitations, risks, security and privacy concerns of performing an evaluation and management service by telephone and the availability of in person appointments. I also discussed with the patient that there may be a patient responsible charge related to this service. The patient expressed understanding and agreed to proceed.   History of Present Illness: 69 year old female former smoker seen for pulmonary consult October 17, 2019 for evaluation of dyspnea, recurrent bronchitis Medical history significant for provoked DVT (on Premarin), fibromyalgia, chronic back pain, trigeminal and occipital neuralgia  Today's televisit is a 65-month follow-up for presumed COPD.  Patient has a strong smoking history.  Has cough, shortness of breath and recurrent bronchitis.  She has presumed COPD.  Was unable to do PFTs due to severe trigeminal neuralgia.  Patient was started on Breo last visit.  Patient says that she is feeling much better with decreased cough and shortness of breath.  Chest x-ray in October 2020 showed minimal left basilar atelectasis versus scarring. Significant stress at home with husband.  Exposed to excessive amount of secondhand smoke.  Patient Active Problem List   Diagnosis Date Noted  . DOE (dyspnea on exertion) 10/17/2019  . Recurrent aphthous ulcer 03/27/2019  . Bilateral leg edema 09/28/2017  . Obesity 10/08/2015  . Migraine headache 01/18/2014  . Dizziness and giddiness 01/18/2014  . Numbness and tingling of both legs 01/18/2014  . Severe obesity (BMI >= 40) ([place]) 06/14/2013  . Numbness and tingling in right hand 05/30/2013  . Hyperlipidemia 06/06/2012  . Fatigue 06/06/2012  . Dvt femoral (deep venous thrombosis) ([place]) 10/28/2011  .  CHRONIC OBSTRUCTIVE PULMONARY DISEASE, ACUTE EXACERBATION 08/07/2010  . UNSPECIFIED VITAMIN D DEFICIENCY 03/31/2010  . BACK PAIN, THORACIC REGION 03/04/2010  . ECZEMA 05/29/2009  . TRIGEMINAL NEURALGIA 05/21/2009  . Anxiety state 07/01/2008  . DEPRESSION 07/01/2008  . REFLEX SYMPATHETIC DYSTROPHY 07/01/2008  . OTHER SPECIFIED TRIGEMINAL NERVE DISORDERS 07/01/2008  . Allergic rhinitis 07/01/2008  . BRONCHITIS, CHRONIC 07/01/2008  . GERD 07/01/2008  . OSTEOARTHRITIS 07/01/2008  . OCCIPITAL NEURALGIA 07/01/2008  . FIBROMYALGIA 07/01/2008  . CARPAL TUNNEL SYNDROME, HX OF 07/01/2008  . MIGRAINES, HX OF 07/01/2008    Current Outpatient Medications on File Prior to Visit  Medication Sig Dispense Refill  . albuterol (VENTOLIN HFA) 108 (90 Base) MCG/ACT inhaler Inhale 2 puffs into the lungs every 6 (six) hours as needed for wheezing or shortness of breath. 18 g 3  . ALPRAZolam (XANAX) 0.5 MG tablet Take 1 tablet (0.5 mg total) by mouth 2 (two) times daily. 180 tablet 0  . ascorbic acid (VITAMIN C) 1000 MG tablet Take 1,000 mg by mouth daily.    Marland Kitchen aspirin 325 MG tablet daily.    . calcium carbonate (OS-CAL - DOSED IN MG OF ELEMENTAL CALCIUM) 1250 MG tablet Take 1 tablet by mouth daily.    . carboxymethylcellulose (REFRESH PLUS) 0.5 % SOLN Take 1 drop by mouth daily as needed. For dry eyes    . cetirizine (ZYRTEC) 10 MG tablet Take 10 mg by mouth daily.      . colchicine 0.6 MG tablet Take 1 tablet (0.6 mg total) by mouth daily. 90 tablet 3  . cycloSPORINE (RESTASIS) 0.05 % ophthalmic emulsion Place 1 drop into both eyes 2 (two) times  daily.    . dexamethasone 0.5 MG/5ML elixir Take two teaspoons and swish, gargle, and spit qid prn canker sores. 237 mL 1  . dronabinol (MARINOL) 5 MG capsule dronabinol 5 mg capsule  TAKE ONE CAPSULE BY MOUTH TWICE A DAY    . fluticasone (FLONASE) 50 MCG/ACT nasal spray Place 2 sprays into both nostrils daily as needed. For nasal congestion 16 g 3  . fluticasone  furoate-vilanterol (BREO ELLIPTA) 100-25 MCG/INH AEPB Inhale 1 puff into the lungs daily. 3 each 3  . frovatriptan (FROVA) 2.5 MG tablet Take 2.5 mg by mouth as needed. If recurs, may repeat after 2 hours. Max of 3 tabs in 24 hours. For migraines.    . Galcanezumab-gnlm 120 MG/ML SOSY Inject into the skin.    . hydrOXYzine (ATARAX) 10 MG tablet Take 10 mg by mouth every 8 (eight) hours as needed. For dizziness per Renaldo Reel, MD    . ketorolac (ACULAR) 0.4 % SOLN Place 1 drop into the left eye 4 (four) times daily.    Marland Kitchen lidocaine (LIDODERM) 5 % Place 1 patch onto the skin daily. Remove & Discard patch within 12 hours or as directed by MD 30 patch 1  . meclizine (ANTIVERT) 25 MG tablet TAKE 1 TABLET BY MOUTH 4 TIMES A DAY AS NEEDED FOR DIZZINESS 120 tablet 1  . meloxicam (MOBIC) 15 MG tablet Take 1 tablet (15 mg total) by mouth daily. 90 tablet 3  . metaxalone (SKELAXIN) 800 MG tablet Take 800 mg by mouth 3 (three) times daily.      . metoclopramide (REGLAN) 10 MG tablet Take 10 mg by mouth 3 (three) times daily as needed. For nausea    . montelukast (SINGULAIR) 10 MG tablet TAKE 1 TABLET (10 MG TOTAL) BY MOUTH DAILY AT 6 PM. 90 tablet 3  . Multiple Vitamin (MULTIVITAMIN) tablet Take 1 tablet by mouth every morning.     Marland Kitchen ofloxacin (OCUFLOX) 0.3 % ophthalmic solution Place 1 drop into the left eye 4 (four) times daily.    . Omega-3 Fatty Acids (FISH OIL) 1200 MG CAPS Take 1 capsule by mouth every evening.    . OPTIVE 0.5-0.9 % ophthalmic solution SMARTSIG:1 Drop(s) In Eye(s) PRN    . potassium chloride (K-DUR) 10 MEQ tablet Take two tablets once daily 90 tablet 3  . promethazine (PHENERGAN) 25 MG tablet SMARTSIG:0.5-2 Tablet(s) By Mouth 3 Times Daily PRN    . RABEprazole (ACIPHEX) 20 MG tablet Take 1 tablet (20 mg total) by mouth daily. 90 tablet 3  . silodosin (RAPAFLO) 8 MG CAPS capsule Take 1 capsule (8 mg total) by mouth daily with breakfast. 90 capsule 3  . tiZANidine (ZANAFLEX) 2 MG tablet  Take 2 mg by mouth at bedtime.    . torsemide (DEMADEX) 10 MG tablet Take 1 tablet (10 mg total) by mouth daily. 90 tablet 3  . valACYclovir (VALTREX) 1000 MG tablet Take 1 tablet (1,000 mg total) by mouth 3 (three) times daily. 21 tablet 0  . vitamin E 400 UNIT capsule Take 400 Units by mouth daily at 6 PM.      No current facility-administered medications on file prior to visit.      Observations/Objective: Speaks in full sentences with no audible distress or wheezing   Assessment and Plan: Presumed COPD-unable to complete PFTs due to severe trigeminal neuralgia. Does have perceived clinical benefit with Breo.  She is continue on her current regimen. Activity as tolerated.  Plan  Patient Instructions  Continue on Breo 1 puff daily, rinse after use Refills will be sent to your pharmacy Albuterol inhaler as needed for wheezing shortness of breath Continue on Zyrtec and Flonase Follow-up with Dr. Elsworth Soho in 4 months and As needed   Please contact office for sooner follow up if symptoms do not improve or worsen or seek emergency care         Follow Up Instructions: Follow-up in 4 months and as needed   I discussed the assessment and treatment plan with the patient. The patient was provided an opportunity to ask questions and all were answered. The patient agreed with the plan and demonstrated an understanding of the instructions.   The patient was advised to call back or seek an in-person evaluation if the symptoms worsen or if the condition fails to improve as anticipated.  I provided 22 minutes of non-face-to-face time during this encounter.   Rexene Edison, NP

## 2019-12-17 NOTE — Patient Instructions (Addendum)
Continue on Breo 1 puff daily, rinse after use Refills will be sent to your pharmacy Albuterol inhaler as needed for wheezing shortness of breath Continue on Zyrtec and Flonase Follow-up with Dr. Elsworth Soho in 4 months and As needed   Please contact office for sooner follow up if symptoms do not improve or worsen or seek emergency care

## 2019-12-18 ENCOUNTER — Ambulatory Visit (INDEPENDENT_AMBULATORY_CARE_PROVIDER_SITE_OTHER): Payer: Medicare Other | Admitting: Otolaryngology

## 2019-12-18 ENCOUNTER — Encounter (INDEPENDENT_AMBULATORY_CARE_PROVIDER_SITE_OTHER): Payer: Self-pay | Admitting: Otolaryngology

## 2019-12-18 ENCOUNTER — Other Ambulatory Visit: Payer: Self-pay

## 2019-12-18 VITALS — Temp 97.3°F

## 2019-12-18 DIAGNOSIS — H9042 Sensorineural hearing loss, unilateral, left ear, with unrestricted hearing on the contralateral side: Secondary | ICD-10-CM | POA: Diagnosis not present

## 2019-12-18 DIAGNOSIS — R42 Dizziness and giddiness: Secondary | ICD-10-CM

## 2019-12-18 NOTE — Progress Notes (Signed)
HPI: Ashley Castaneda is a 69 y.o. female who presents for evaluation of chronic dizziness.  She apparently has had a long history of chronic headaches.  She has had surgery for trigeminal neuralgia.  She has subsequently developed occipital neuralgia.  She sees [facility] Y[facility]y [facility] [place].  Initially the dizziness was felt to be secondary to her headaches and she was treated with meclizine but she is taking meclizine 3-4 times a day on a constant basis.  She does not really describe vertigo or spinning sensation. She presents here to see if the dizziness may be related to her ears. She apparently has lost hearing in her left ear over 40 years ago.  She tried hearing aids but this did not seem to help.   Past Medical History:  Diagnosis Date  . Adenomatous colon polyp 09/2008  . ALLERGIC RHINITIS 07/01/2008  . ANXIETY 07/01/2008  . BACK PAIN, THORACIC REGION 03/04/2010  . BRONCHITIS, CHRONIC 07/01/2008  . CARPAL TUNNEL SYNDROME, HX OF 07/01/2008  . CHRONIC OBSTRUCTIVE PULMONARY DISEASE, ACUTE EXACERBATION 08/07/2010  . DEPRESSION 07/01/2008  . DVT, HX OF 07/01/2008  . ECZEMA 05/29/2009  . FACIAL PAIN 12/25/2009  . FIBROMYALGIA 07/01/2008  . GERD 07/01/2008  . Hiatal hernia   . Hyperlipidemia   . LEG PAIN, BILATERAL 07/01/2008  . MIGRAINES, HX OF 07/01/2008  . OCCIPITAL NEURALGIA 07/01/2008  . OSTEOARTHRITIS 07/01/2008  . Other specified trigeminal nerve disorders 07/01/2008  . REFLEX SYMPATHETIC DYSTROPHY 07/01/2008  . Trigeminal neuralgia 05/21/2009  . Unspecified vitamin D deficiency 03/31/2010   Past Surgical History:  Procedure Laterality Date  . CARPAL TUNNEL RELEASE  2009   RIGHT  . HEAD CRANIECTOMY  1994   MICROVASULAR DECOMPRESSION  . LAVH     BSO  . LEFT KNEE SURGERY  1989  . OCCIPITAL NERVE STIMULATOR INSERTION    . RIGHT FOOT BONE SPUR  1987  . RIGHT FOOT SURGERY  1998   BAD CUT  . RIGHT JAW SURGERY  2002  . RIGHT KNEE SURGERY  1990  . [place]  . RIGHT THUMB SUGERY  2004  . TONSILLECTOMY  1957   Social History   Socioeconomic History  . Marital status: Married    Spouse name: [name]  . Number of children: 0  . Years of education: college-3  . Highest education level: Not on file  Occupational History  . Occupation: disabled  Tobacco Use  . Smoking status: Former Smoker    Packs/day: 2.00    Years: 30.00    Pack years: 60.00    Types: Cigarettes    Start date: 63    Quit date: 02/04/2012    Years since quitting: 7.8  . Smokeless tobacco: Never Used  . Tobacco comment: nicotine in vapor and is very low nicotine   Substance and Sexual Activity  . Alcohol use: No    Alcohol/week: 0.0 standard drinks    Comment: quit 2000  . Drug use: No  . Sexual activity: Never  Other Topics Concern  . Not on file  Social History Narrative   Patient lives at home with husband [name].    Patient has no children and 2 step children.    Patient is on disability since 89 for arthalgia.    Patient has 3 years of college.    Patient is right handed.    Social Determinants of Health   Financial Resource Strain:   . Difficulty of  Paying Living Expenses:   Food Insecurity:   . Worried About Charity fundraiser in the Last Year:   . Arboriculturist in the Last Year:   Transportation Needs:   . Film/video editor (Medical):   Marland Kitchen Lack of Transportation (Non-Medical):   Physical Activity:   . Days of Exercise per Week:   . Minutes of Exercise per Session:   Stress:   . Feeling of Stress :   Social Connections:   . Frequency of Communication with Friends and Family:   . Frequency of Social Gatherings with Friends and Family:   . Attends Religious Services:   . Active Member of Clubs or Organizations:   . Attends Archivist Meetings:   Marland Kitchen Marital Status:    Family History  Problem Relation Age of Onset  . Ovarian cancer Mother 79  . Hypertension Father   . Prostate cancer Father   . Heart attack  Father   . Heart attack Maternal Grandmother   . Stroke Paternal Grandmother    Allergies  Allergen Reactions  . Cefazolin Anaphylaxis, Swelling and Other (See Comments)    Ancef - in surgery, swelled up, turned blue, heart problems Turns Blue  . Baclofen Other (See Comments)    REACTION: confusion REACTION: confusion REACTION: confusion REACTION: confusion  . Carbamazepine Other (See Comments)    REACTION: confusion Other reaction(s): Confusion Confusion,Bad reactions to all seizure medicine  . Duloxetine Other (See Comments)  . Fentanyl Nausea And Vomiting and Other (See Comments)    REACTION: nausea and vomiting  . Gabapentin Other (See Comments)    REACTION: confusion  . Naproxen Other (See Comments)    Elevates blood pressure  . Oxcarbazepine Other (See Comments)    REACTION: confusion Other reaction(s): Other REACTION: confusion REACTION: confusion REACTION: confusion   . Pregabalin Other (See Comments)    Other reaction(s): Other  . Serotonin Reuptake Inhibitors (Ssris) Other (See Comments)  . Topiramate Other (See Comments)    Other reaction(s): Unknown  . Venlafaxine Other (See Comments)    Other reaction(s): Unknown  . Zonisamide Other (See Comments)    Other reaction(s): Other  . Ceclor [Cefaclor]     hives  . Milnacipran Other (See Comments)  . Ondansetron Nausea And Vomiting    Nausea and vomiting after IV administration   Prior to Admission medications   Medication Sig Start Date End Date Taking? Authorizing Provider  albuterol (VENTOLIN HFA) 108 (90 Base) MCG/ACT inhaler Inhale 2 puffs into the lungs every 6 (six) hours as needed for wheezing or shortness of breath. 10/17/19  Yes Rigoberto Noel, MD  ALPRAZolam Duanne Moron) 0.5 MG tablet Take 1 tablet (0.5 mg total) by mouth 2 (two) times daily. 10/01/19  Yes Burchette, Alinda Sierras, MD  ascorbic acid (VITAMIN C) 1000 MG tablet Take 1,000 mg by mouth daily.   Yes [provider]  aspirin 325 MG tablet  daily.   Yes [provider]  calcium carbonate (OS-CAL - DOSED IN MG OF ELEMENTAL CALCIUM) 1250 MG tablet Take 1 tablet by mouth daily.   Yes [provider]  carboxymethylcellulose (REFRESH PLUS) 0.5 % SOLN Take 1 drop by mouth daily as needed. For dry eyes   Yes [provider]  cetirizine (ZYRTEC) 10 MG tablet Take 10 mg by mouth daily.     Yes [provider]  colchicine 0.6 MG tablet Take 1 tablet (0.6 mg total) by mouth daily. 03/27/19  Yes Burchette, Alinda Sierras,  MD  cycloSPORINE (RESTASIS) 0.05 % ophthalmic emulsion Place 1 drop into both eyes 2 (two) times daily.   Yes [provider]  dexamethasone 0.5 MG/5ML elixir Take two teaspoons and swish, gargle, and spit qid prn canker sores. 03/27/19  Yes Burchette, Alinda Sierras, MD  dronabinol (MARINOL) 5 MG capsule dronabinol 5 mg capsule  TAKE ONE CAPSULE BY MOUTH TWICE A DAY   Yes [provider]  fluticasone (FLONASE) 50 MCG/ACT nasal spray Place 2 sprays into both nostrils daily as needed. For nasal congestion 04/25/18 04/01/20 Yes Burchette, Alinda Sierras, MD  fluticasone furoate-vilanterol (BREO ELLIPTA) 100-25 MCG/INH AEPB Inhale 1 puff into the lungs daily. 10/22/19  Yes Rigoberto Noel, MD  frovatriptan (FROVA) 2.5 MG tablet Take 2.5 mg by mouth as needed. If recurs, may repeat after 2 hours. Max of 3 tabs in 24 hours. For migraines.   Yes [provider]  Galcanezumab-gnlm 120 MG/ML SOSY Inject into the skin.   Yes [provider]  hydrOXYzine (ATARAX) 10 MG tablet Take 10 mg by mouth every 8 (eight) hours as needed. For dizziness per Renaldo Reel, MD   Yes [provider]  ketorolac (ACULAR) 0.4 % SOLN Place 1 drop into the left eye 4 (four) times daily. 12/11/19  Yes [provider]  lidocaine (LIDODERM) 5 % Place 1 patch onto the skin daily. Remove & Discard patch within 12 hours or as directed by MD 04/12/18  Yes Burchette, Alinda Sierras, MD  meclizine (ANTIVERT) 25 MG tablet  TAKE 1 TABLET BY MOUTH 4 TIMES A DAY AS NEEDED FOR DIZZINESS 04/17/14  Yes Kathrynn Ducking, MD  meloxicam (MOBIC) 15 MG tablet Take 1 tablet (15 mg total) by mouth daily. 05/07/19  Yes Burchette, Alinda Sierras, MD  metaxalone (SKELAXIN) 800 MG tablet Take 800 mg by mouth 3 (three) times daily.     Yes [provider]  metoclopramide (REGLAN) 10 MG tablet Take 10 mg by mouth 3 (three) times daily as needed. For nausea   Yes [provider]  montelukast (SINGULAIR) 10 MG tablet TAKE 1 TABLET (10 MG TOTAL) BY MOUTH DAILY AT 6 PM. 03/27/19  Yes Burchette, Alinda Sierras, MD  Multiple Vitamin (MULTIVITAMIN) tablet Take 1 tablet by mouth every morning.    Yes [provider]  ofloxacin (OCUFLOX) 0.3 % ophthalmic solution Place 1 drop into the left eye 4 (four) times daily. 12/11/19  Yes [provider]  Omega-3 Fatty Acids (FISH OIL) 1200 MG CAPS Take 1 capsule by mouth every evening.   Yes [provider]  OPTIVE 0.5-0.9 % ophthalmic solution SMARTSIG:1 Drop(s) In Eye(s) PRN 12/10/19  Yes [provider]  potassium chloride (K-DUR) 10 MEQ tablet Take two tablets once daily 03/27/19  Yes Burchette, Alinda Sierras, MD  promethazine (PHENERGAN) 25 MG tablet SMARTSIG:0.5-2 Tablet(s) By Mouth 3 Times Daily PRN 12/10/19  Yes [provider]  RABEprazole (ACIPHEX) 20 MG tablet Take 1 tablet (20 mg total) by mouth daily. 03/27/19  Yes Burchette, Alinda Sierras, MD  silodosin (RAPAFLO) 8 MG CAPS capsule Take 1 capsule (8 mg total) by mouth daily with breakfast. 05/22/19  Yes Burchette, Alinda Sierras, MD  tiZANidine (ZANAFLEX) 2 MG tablet Take 2 mg by mouth at bedtime.   Yes [provider]  torsemide (DEMADEX) 10 MG tablet Take 1 tablet (10 mg total) by mouth daily. 03/27/19  Yes Burchette, Alinda Sierras, MD  valACYclovir (VALTREX) 1000 MG tablet Take 1 tablet (1,000 mg total) by mouth 3 (  three) times daily. 10/04/19  Yes Colin Benton R, DO  vitamin E 400 UNIT capsule Take 400 Units by mouth  daily at 6 PM.    Yes [provider]     Positive ROS: Otherwise negative  All other systems have been reviewed and were otherwise negative with the exception of those mentioned in the HPI and as above.  Physical Exam: Constitutional: Alert, well-appearing, no acute distress Ears: External ears without lesions or tenderness. Ear canals are clear bilaterally.  Both TMs are clear bilaterally.  On hearing screening with the 1024 tuning fork she has normal hearing in the left ear essentially normal hearing in the right ear.  Dix-Hallpike testing revealed no evidence of BPPV. Nasal: External nose without lesions.. Clear nasal passages bilaterally. Oral: Lips and gums without lesions. Tongue and palate mucosa without lesions. Posterior oropharynx clear. Neck: No palpable adenopathy or masses Respiratory: Breathing comfortably  Skin: No facial/neck lesions or rash noted.  Procedures  Assessment: Chronic dizziness questionable etiology.  Questionable vestibular in origin.  Patient using chronic meclizine. Left ear hearing loss longstanding.  Plan: We will plan on scheduling patient for VNG testing and audiologic testing to evaluate inner ear function.  She will follow-up following the above test.  Radene Journey, MD

## 2019-12-26 ENCOUNTER — Other Ambulatory Visit: Payer: Medicare Other

## 2020-01-07 DIAGNOSIS — H9072 Mixed conductive and sensorineural hearing loss, unilateral, left ear, with unrestricted hearing on the contralateral side: Secondary | ICD-10-CM | POA: Diagnosis not present

## 2020-01-07 DIAGNOSIS — R42 Dizziness and giddiness: Secondary | ICD-10-CM | POA: Diagnosis not present

## 2020-01-14 ENCOUNTER — Ambulatory Visit (INDEPENDENT_AMBULATORY_CARE_PROVIDER_SITE_OTHER): Payer: Medicare Other | Admitting: Otolaryngology

## 2020-01-14 ENCOUNTER — Encounter: Payer: Self-pay | Admitting: Family Medicine

## 2020-01-14 ENCOUNTER — Other Ambulatory Visit: Payer: Self-pay

## 2020-01-14 ENCOUNTER — Encounter (INDEPENDENT_AMBULATORY_CARE_PROVIDER_SITE_OTHER): Payer: Self-pay | Admitting: Otolaryngology

## 2020-01-14 VITALS — Temp 96.6°F

## 2020-01-14 DIAGNOSIS — E2839 Other primary ovarian failure: Secondary | ICD-10-CM

## 2020-01-14 DIAGNOSIS — R42 Dizziness and giddiness: Secondary | ICD-10-CM | POA: Diagnosis not present

## 2020-01-14 DIAGNOSIS — H9042 Sensorineural hearing loss, unilateral, left ear, with unrestricted hearing on the contralateral side: Secondary | ICD-10-CM | POA: Diagnosis not present

## 2020-01-14 NOTE — Progress Notes (Signed)
HPI: Ashley Castaneda is a 69 y.o. female who returns today for evaluation of dizziness and left ear hearing loss.  Since last visit several weeks ago everything is about the same.  She has occasional dizziness as well as nausea.  She has had occasional left ear pain.  She has a history of chronic headaches and is followed by Dr Chancy Milroy at the [facility].  She has had longstanding hearing loss in the left ear that is gradually gotten worse.  She returns today following audiologic testing and ENG testing. The hearing test demonstrated severe left ear hearing loss down to 100 dB with essentially normal hearing in the right ear with SRT of 15 dB.  She had type A tympanograms bilaterally.  On review of her ENG testing this demonstrated a primarily peripheral pathology with a very weak left vestibular function.  Dix-Hallpike testing was negative with no indication of BPPV.Marland Kitchen  Past Medical History:  Diagnosis Date  . Adenomatous colon polyp 09/2008  . ALLERGIC RHINITIS 07/01/2008  . ANXIETY 07/01/2008  . BACK PAIN, THORACIC REGION 03/04/2010  . BRONCHITIS, CHRONIC 07/01/2008  . CARPAL TUNNEL SYNDROME, HX OF 07/01/2008  . CHRONIC OBSTRUCTIVE PULMONARY DISEASE, ACUTE EXACERBATION 08/07/2010  . DEPRESSION 07/01/2008  . DVT, HX OF 07/01/2008  . ECZEMA 05/29/2009  . FACIAL PAIN 12/25/2009  . FIBROMYALGIA 07/01/2008  . GERD 07/01/2008  . Hiatal hernia   . Hyperlipidemia   . LEG PAIN, BILATERAL 07/01/2008  . MIGRAINES, HX OF 07/01/2008  . OCCIPITAL NEURALGIA 07/01/2008  . OSTEOARTHRITIS 07/01/2008  . Other specified trigeminal nerve disorders 07/01/2008  . REFLEX SYMPATHETIC DYSTROPHY 07/01/2008  . Trigeminal neuralgia 05/21/2009  . Unspecified vitamin D deficiency 03/31/2010   Past Surgical History:  Procedure Laterality Date  . CARPAL TUNNEL RELEASE  2009   RIGHT  . HEAD CRANIECTOMY  1994   MICROVASULAR DECOMPRESSION  . LAVH     BSO  . LEFT KNEE SURGERY  1989  . OCCIPITAL NERVE STIMULATOR INSERTION     . RIGHT FOOT BONE SPUR  1987  . RIGHT FOOT SURGERY  1998   BAD CUT  . RIGHT JAW SURGERY  2002  . RIGHT KNEE SURGERY  1990  . [place]  . RIGHT THUMB SUGERY  2004  . TONSILLECTOMY  1957   Social History   Socioeconomic History  . Marital status: Married    Spouse name: [name]  . Number of children: 0  . Years of education: college-3  . Highest education level: Not on file  Occupational History  . Occupation: disabled  Tobacco Use  . Smoking status: Former Smoker    Packs/day: 2.00    Years: 30.00    Pack years: 60.00    Types: Cigarettes    Start date: 58    Quit date: 02/04/2012    Years since quitting: 7.9  . Smokeless tobacco: Never Used  . Tobacco comment: nicotine in vapor and is very low nicotine   Vaping Use  . Vaping Use: Every day  Substance and Sexual Activity  . Alcohol use: No    Alcohol/week: 0.0 standard drinks    Comment: quit 2000  . Drug use: No  . Sexual activity: Never  Other Topics Concern  . Not on file  Social History Narrative   Patient lives at home with husband [name].    Patient has no children and 2 step children.    Patient is on disability since 32 for arthalgia.  Patient has 3 years of college.    Patient is right handed.    Social Determinants of Health   Financial Resource Strain:   . Difficulty of Paying Living Expenses:   Food Insecurity:   . Worried About Charity fundraiser in the Last Year:   . Arboriculturist in the Last Year:   Transportation Needs:   . Film/video editor (Medical):   Marland Kitchen Lack of Transportation (Non-Medical):   Physical Activity:   . Days of Exercise per Week:   . Minutes of Exercise per Session:   Stress:   . Feeling of Stress :   Social Connections:   . Frequency of Communication with Friends and Family:   . Frequency of Social Gatherings with Friends and Family:   . Attends Religious Services:   . Active Member of Clubs or Organizations:   . Attends  Archivist Meetings:   Marland Kitchen Marital Status:    Family History  Problem Relation Age of Onset  . Ovarian cancer Mother 65  . Hypertension Father   . Prostate cancer Father   . Heart attack Father   . Heart attack Maternal Grandmother   . Stroke Paternal Grandmother    Allergies  Allergen Reactions  . Cefazolin Anaphylaxis, Swelling and Other (See Comments)    Ancef - in surgery, swelled up, turned blue, heart problems Turns Blue  . Baclofen Other (See Comments)    REACTION: confusion REACTION: confusion REACTION: confusion REACTION: confusion  . Carbamazepine Other (See Comments)    REACTION: confusion Other reaction(s): Confusion Confusion,Bad reactions to all seizure medicine  . Duloxetine Other (See Comments)  . Fentanyl Nausea And Vomiting and Other (See Comments)    REACTION: nausea and vomiting  . Gabapentin Other (See Comments)    REACTION: confusion  . Naproxen Other (See Comments)    Elevates blood pressure  . Oxcarbazepine Other (See Comments)    REACTION: confusion Other reaction(s): Other REACTION: confusion REACTION: confusion REACTION: confusion   . Pregabalin Other (See Comments)    Other reaction(s): Other  . Serotonin Reuptake Inhibitors (Ssris) Other (See Comments)  . Topiramate Other (See Comments)    Other reaction(s): Unknown  . Venlafaxine Other (See Comments)    Other reaction(s): Unknown  . Zonisamide Other (See Comments)    Other reaction(s): Other  . Ceclor [Cefaclor]     hives  . Milnacipran Other (See Comments)  . Ondansetron Nausea And Vomiting    Nausea and vomiting after IV administration   Prior to Admission medications   Medication Sig Start Date End Date Taking? Authorizing Provider  albuterol (VENTOLIN HFA) 108 (90 Base) MCG/ACT inhaler Inhale 2 puffs into the lungs every 6 (six) hours as needed for wheezing or shortness of breath. 10/17/19  Yes Rigoberto Noel, MD  ALPRAZolam Duanne Moron) 0.5 MG tablet Take 1 tablet (0.5 mg  total) by mouth 2 (two) times daily. 10/01/19  Yes Burchette, Alinda Sierras, MD  ascorbic acid (VITAMIN C) 1000 MG tablet Take 1,000 mg by mouth daily.   Yes [provider]  aspirin 325 MG tablet daily.   Yes [provider]  calcium carbonate (OS-CAL - DOSED IN MG OF ELEMENTAL CALCIUM) 1250 MG tablet Take 1 tablet by mouth daily.   Yes [provider]  carboxymethylcellulose (REFRESH PLUS) 0.5 % SOLN Take 1 drop by mouth daily as needed. For dry eyes   Yes [provider]  cetirizine (ZYRTEC) 10 MG tablet Take 10 mg by  mouth daily.     Yes [provider]  colchicine 0.6 MG tablet Take 1 tablet (0.6 mg total) by mouth daily. 03/27/19  Yes Burchette, Alinda Sierras, MD  cycloSPORINE (RESTASIS) 0.05 % ophthalmic emulsion Place 1 drop into both eyes 2 (two) times daily.   Yes [provider]  dexamethasone 0.5 MG/5ML elixir Take two teaspoons and swish, gargle, and spit qid prn canker sores. 03/27/19  Yes Burchette, Alinda Sierras, MD  dronabinol (MARINOL) 5 MG capsule dronabinol 5 mg capsule  TAKE ONE CAPSULE BY MOUTH TWICE A DAY   Yes [provider]  fluticasone (FLONASE) 50 MCG/ACT nasal spray Place 2 sprays into both nostrils daily as needed. For nasal congestion 04/25/18 04/01/20 Yes Burchette, Alinda Sierras, MD  fluticasone furoate-vilanterol (BREO ELLIPTA) 100-25 MCG/INH AEPB Inhale 1 puff into the lungs daily. 10/22/19  Yes Rigoberto Noel, MD  frovatriptan (FROVA) 2.5 MG tablet Take 2.5 mg by mouth as needed. If recurs, may repeat after 2 hours. Max of 3 tabs in 24 hours. For migraines.   Yes [provider]  Galcanezumab-gnlm 120 MG/ML SOSY Inject into the skin.   Yes [provider]  hydrOXYzine (ATARAX) 10 MG tablet Take 10 mg by mouth every 8 (eight) hours as needed. For dizziness per Renaldo Reel, MD   Yes [provider]  ketorolac (ACULAR) 0.4 % SOLN Place 1 drop into the left eye 4 (four) times daily. 12/11/19  Yes [provider]  lidocaine (LIDODERM) 5 % Place 1 patch onto the skin daily. Remove & Discard patch within 12 hours or as directed by MD 04/12/18  Yes Burchette, Alinda Sierras, MD  meclizine (ANTIVERT) 25 MG tablet TAKE 1 TABLET BY MOUTH 4 TIMES A DAY AS NEEDED FOR DIZZINESS 04/17/14  Yes Kathrynn Ducking, MD  meloxicam (MOBIC) 15 MG tablet Take 1 tablet (15 mg total) by mouth daily. 05/07/19  Yes Burchette, Alinda Sierras, MD  metaxalone (SKELAXIN) 800 MG tablet Take 800 mg by mouth 3 (three) times daily.     Yes [provider]  metoclopramide (REGLAN) 10 MG tablet Take 10 mg by mouth 3 (three) times daily as needed. For nausea   Yes [provider]  montelukast (SINGULAIR) 10 MG tablet TAKE 1 TABLET (10 MG TOTAL) BY MOUTH DAILY AT 6 PM. 03/27/19  Yes Burchette, Alinda Sierras, MD  Multiple Vitamin (MULTIVITAMIN) tablet Take 1 tablet by mouth every morning.    Yes [provider]  ofloxacin (OCUFLOX) 0.3 % ophthalmic solution Place 1 drop into the left eye 4 (four) times daily. 12/11/19  Yes [provider]  Omega-3 Fatty Acids (FISH OIL) 1200 MG CAPS Take 1 capsule by mouth every evening.   Yes [provider]  OPTIVE 0.5-0.9 % ophthalmic solution SMARTSIG:1 Drop(s) In Eye(s) PRN 12/10/19  Yes [provider]  potassium chloride (K-DUR) 10 MEQ tablet Take two tablets once daily 03/27/19  Yes Burchette, Alinda Sierras, MD  promethazine (PHENERGAN) 25 MG tablet SMARTSIG:0.5-2 Tablet(s) By Mouth 3 Times Daily PRN 12/10/19  Yes [provider]  RABEprazole (ACIPHEX) 20 MG tablet Take 1 tablet (20 mg total) by mouth daily. 03/27/19  Yes Burchette, Alinda Sierras, MD  silodosin (RAPAFLO) 8 MG CAPS capsule Take 1 capsule (8 mg total) by mouth daily with breakfast. 05/22/19  Yes Burchette, Alinda Sierras, MD  tiZANidine (ZANAFLEX) 2 MG tablet Take 2 mg by mouth at bedtime.   Yes [provider]  torsemide (DEMADEX) 10 MG tablet Take  1 tablet (10 mg total) by mouth daily. 03/27/19  Yes  Burchette, Alinda Sierras, MD  valACYclovir (VALTREX) 1000 MG tablet Take 1 tablet (1,000 mg total) by mouth 3 (three) times daily. 10/04/19  Yes Colin Benton R, DO  vitamin E 400 UNIT capsule Take 400 Units by mouth daily at 6 PM.    Yes [provider]     Positive ROS: Otherwise negative  All other systems have been reviewed and were otherwise negative with the exception of those mentioned in the HPI and as above.  Physical Exam: Constitutional: Alert, well-appearing, no acute distress Ears: External ears without lesions or tenderness. Ear canals are clear bilaterally with intact, clear TMs bilaterally. Nasal: External nose without lesions. Clear nasal passages Oral: Lips and gums without lesions. Tongue and palate mucosa without lesions. Posterior oropharynx clear. Neck: No palpable adenopathy or masses Respiratory: Breathing comfortably  Skin: No facial/neck lesions or rash noted.  Procedures  Assessment: Profound left ear SNHL. Severe left vestibular weakness.  Plan: Treatment options are limited.  Briefly discussed with her that she would be a candidate for a cochlear implant in the left ear if she desired this.  She does not have enough hearing that would benefit from hearing aid in the left ear. Concerning her dizziness she could consider having vestibular rehab or other exercise program that will help with her balance. If she has nausea recommended use of meclizine and/or Phenergan which she already has.   Radene Journey, MD  Cc: Renaldo Reel, MD

## 2020-01-22 LAB — HM DEXA SCAN

## 2020-01-22 LAB — HM MAMMOGRAPHY

## 2020-01-25 ENCOUNTER — Encounter: Payer: Self-pay | Admitting: Family Medicine

## 2020-01-30 ENCOUNTER — Other Ambulatory Visit: Payer: Self-pay | Admitting: Oral Surgery

## 2020-01-30 DIAGNOSIS — D490 Neoplasm of unspecified behavior of digestive system: Secondary | ICD-10-CM | POA: Diagnosis not present

## 2020-01-30 DIAGNOSIS — K029 Dental caries, unspecified: Secondary | ICD-10-CM | POA: Diagnosis not present

## 2020-01-31 ENCOUNTER — Encounter (INDEPENDENT_AMBULATORY_CARE_PROVIDER_SITE_OTHER): Payer: Self-pay

## 2020-01-31 DIAGNOSIS — G43719 Chronic migraine without aura, intractable, without status migrainosus: Secondary | ICD-10-CM | POA: Diagnosis not present

## 2020-01-31 DIAGNOSIS — R52 Pain, unspecified: Secondary | ICD-10-CM | POA: Diagnosis not present

## 2020-01-31 DIAGNOSIS — G9050 Complex regional pain syndrome I, unspecified: Secondary | ICD-10-CM | POA: Diagnosis not present

## 2020-01-31 DIAGNOSIS — B0229 Other postherpetic nervous system involvement: Secondary | ICD-10-CM | POA: Diagnosis not present

## 2020-01-31 DIAGNOSIS — G43119 Migraine with aura, intractable, without status migrainosus: Secondary | ICD-10-CM | POA: Diagnosis not present

## 2020-01-31 DIAGNOSIS — G509 Disorder of trigeminal nerve, unspecified: Secondary | ICD-10-CM | POA: Diagnosis not present

## 2020-02-07 ENCOUNTER — Telehealth: Payer: Self-pay | Admitting: Family Medicine

## 2020-02-07 DIAGNOSIS — B001 Herpesviral vesicular dermatitis: Secondary | ICD-10-CM | POA: Diagnosis not present

## 2020-02-07 DIAGNOSIS — K120 Recurrent oral aphthae: Secondary | ICD-10-CM | POA: Diagnosis not present

## 2020-02-07 NOTE — Telephone Encounter (Signed)
Left message for patient to schedule Annual Wellness Visit.  Please schedule with Nurse Health Advisor Shannon Crews, RN at [facility]  

## 2020-02-11 DIAGNOSIS — M47816 Spondylosis without myelopathy or radiculopathy, lumbar region: Secondary | ICD-10-CM | POA: Diagnosis not present

## 2020-02-15 ENCOUNTER — Encounter: Payer: Self-pay | Admitting: Family Medicine

## 2020-02-15 MED ORDER — POTASSIUM CHLORIDE ER 10 MEQ PO TBCR: EXTENDED_RELEASE_TABLET | ORAL | 3 refills | Status: DC

## 2020-02-15 MED ORDER — MONTELUKAST SODIUM 10 MG PO TABS: ORAL_TABLET | ORAL | 3 refills | Status: DC

## 2020-02-15 MED ORDER — RABEPRAZOLE SODIUM 20 MG PO TBEC: 20.0000 mg | DELAYED_RELEASE_TABLET | Freq: Every day | ORAL | 3 refills | Status: DC

## 2020-02-15 MED ORDER — MELOXICAM 15 MG PO TABS: 15.0000 mg | ORAL_TABLET | Freq: Every day | ORAL | 3 refills | Status: DC

## 2020-02-15 MED ORDER — COLCHICINE 0.6 MG PO TABS: 0.6000 mg | ORAL_TABLET | Freq: Every day | ORAL | 3 refills | Status: DC

## 2020-02-15 MED ORDER — TORSEMIDE 10 MG PO TABS: 10.0000 mg | ORAL_TABLET | Freq: Every day | ORAL | 3 refills | Status: DC

## 2020-02-15 MED ORDER — ALPRAZOLAM 0.5 MG PO TABS: 0.5000 mg | ORAL_TABLET | Freq: Two times a day (BID) | ORAL | 0 refills | Status: DC

## 2020-02-15 NOTE — Telephone Encounter (Signed)
OK to refill other medications as requested.  Do recommend in office follow up within next 6 months.

## 2020-02-18 MED ORDER — DEXAMETHASONE 0.5 MG/5ML PO ELIX: ORAL_SOLUTION | ORAL | 2 refills | Status: DC

## 2020-02-18 MED ORDER — TRIAMCINOLONE ACETONIDE 0.1 % MT PSTE: 1.0000 "application " | PASTE | Freq: Two times a day (BID) | OROMUCOSAL | 3 refills | Status: DC

## 2020-02-21 DIAGNOSIS — D490 Neoplasm of unspecified behavior of digestive system: Secondary | ICD-10-CM | POA: Diagnosis not present

## 2020-02-21 DIAGNOSIS — K029 Dental caries, unspecified: Secondary | ICD-10-CM | POA: Diagnosis not present

## 2020-02-29 DIAGNOSIS — H2181 Floppy iris syndrome: Secondary | ICD-10-CM | POA: Diagnosis not present

## 2020-02-29 DIAGNOSIS — H25813 Combined forms of age-related cataract, bilateral: Secondary | ICD-10-CM | POA: Diagnosis not present

## 2020-02-29 DIAGNOSIS — H0100A Unspecified blepharitis right eye, upper and lower eyelids: Secondary | ICD-10-CM | POA: Diagnosis not present

## 2020-02-29 DIAGNOSIS — H11002 Unspecified pterygium of left eye: Secondary | ICD-10-CM | POA: Diagnosis not present

## 2020-02-29 DIAGNOSIS — H04123 Dry eye syndrome of bilateral lacrimal glands: Secondary | ICD-10-CM | POA: Diagnosis not present

## 2020-02-29 DIAGNOSIS — H0100B Unspecified blepharitis left eye, upper and lower eyelids: Secondary | ICD-10-CM | POA: Diagnosis not present

## 2020-02-29 DIAGNOSIS — G500 Trigeminal neuralgia: Secondary | ICD-10-CM | POA: Diagnosis not present

## 2020-02-29 DIAGNOSIS — H11442 Conjunctival cysts, left eye: Secondary | ICD-10-CM | POA: Diagnosis not present

## 2020-02-29 DIAGNOSIS — D23121 Other benign neoplasm of skin of left upper eyelid, including canthus: Secondary | ICD-10-CM | POA: Diagnosis not present

## 2020-02-29 DIAGNOSIS — G4451 Hemicrania continua: Secondary | ICD-10-CM | POA: Diagnosis not present

## 2020-03-07 ENCOUNTER — Encounter: Payer: Self-pay | Admitting: Family Medicine

## 2020-03-07 DIAGNOSIS — E785 Hyperlipidemia, unspecified: Secondary | ICD-10-CM

## 2020-03-07 DIAGNOSIS — I10 Essential (primary) hypertension: Secondary | ICD-10-CM

## 2020-03-11 NOTE — Telephone Encounter (Signed)
I will place lab order as per her request.  Our record shows that we refilled the Xanax on 02/15/20 for 3 months - so should not refill yet.

## 2020-03-12 DIAGNOSIS — M79642 Pain in left hand: Secondary | ICD-10-CM | POA: Diagnosis not present

## 2020-03-12 DIAGNOSIS — M79641 Pain in right hand: Secondary | ICD-10-CM | POA: Diagnosis not present

## 2020-03-12 DIAGNOSIS — M25521 Pain in right elbow: Secondary | ICD-10-CM | POA: Diagnosis not present

## 2020-03-13 ENCOUNTER — Telehealth: Payer: Self-pay | Admitting: Adult Health

## 2020-03-13 ENCOUNTER — Telehealth: Payer: Self-pay | Admitting: Physician Assistant

## 2020-03-13 NOTE — Telephone Encounter (Signed)
New Message   Patient is calling because she was billed as a smoker on her 10/02/19 statement. Patient states that she no longer smokes so this needs to be changed.

## 2020-03-26 DIAGNOSIS — S66911A Strain of unspecified muscle, fascia and tendon at wrist and hand level, right hand, initial encounter: Secondary | ICD-10-CM | POA: Diagnosis not present

## 2020-03-26 DIAGNOSIS — M25531 Pain in right wrist: Secondary | ICD-10-CM | POA: Diagnosis not present

## 2020-03-26 NOTE — Telephone Encounter (Signed)
Pt should still have xanax until the end of Sept.

## 2020-04-01 DIAGNOSIS — H25813 Combined forms of age-related cataract, bilateral: Secondary | ICD-10-CM | POA: Diagnosis not present

## 2020-04-01 DIAGNOSIS — H25811 Combined forms of age-related cataract, right eye: Secondary | ICD-10-CM | POA: Diagnosis not present

## 2020-04-01 DIAGNOSIS — Z01818 Encounter for other preprocedural examination: Secondary | ICD-10-CM | POA: Diagnosis not present

## 2020-04-02 ENCOUNTER — Other Ambulatory Visit: Payer: Medicare Other

## 2020-04-02 DIAGNOSIS — M47816 Spondylosis without myelopathy or radiculopathy, lumbar region: Secondary | ICD-10-CM | POA: Diagnosis not present

## 2020-04-03 NOTE — Telephone Encounter (Signed)
Can you please send in Xanax for pt please if appropiate

## 2020-04-04 MED ORDER — ALPRAZOLAM 0.5 MG PO TABS: 0.5000 mg | ORAL_TABLET | Freq: Two times a day (BID) | ORAL | 0 refills | Status: DC

## 2020-04-04 NOTE — Addendum Note (Signed)
Addended by: Eulas Post on: 04/04/2020 07:08 AM   Modules accepted: Orders

## 2020-04-04 NOTE — Telephone Encounter (Signed)
I printed this out

## 2020-04-05 NOTE — Telephone Encounter (Signed)
Patrice and Kathlee Nations, please advise if this was ever taken care of.

## 2020-04-07 NOTE — Telephone Encounter (Signed)
I have faxed the printed Rx for Alprazolam to [facility] fax number [phone]  Confirmation successful

## 2020-04-07 NOTE — Telephone Encounter (Signed)
This seems to be correct - pt currently has no outstanding balance with [place] -pr

## 2020-04-10 DIAGNOSIS — I10 Essential (primary) hypertension: Secondary | ICD-10-CM | POA: Diagnosis not present

## 2020-04-10 DIAGNOSIS — K219 Gastro-esophageal reflux disease without esophagitis: Secondary | ICD-10-CM | POA: Diagnosis not present

## 2020-04-10 DIAGNOSIS — H2511 Age-related nuclear cataract, right eye: Secondary | ICD-10-CM | POA: Diagnosis not present

## 2020-04-10 DIAGNOSIS — Z8249 Family history of ischemic heart disease and other diseases of the circulatory system: Secondary | ICD-10-CM | POA: Diagnosis not present

## 2020-04-10 DIAGNOSIS — E669 Obesity, unspecified: Secondary | ICD-10-CM | POA: Diagnosis not present

## 2020-04-10 DIAGNOSIS — H25811 Combined forms of age-related cataract, right eye: Secondary | ICD-10-CM | POA: Diagnosis not present

## 2020-04-10 DIAGNOSIS — H52201 Unspecified astigmatism, right eye: Secondary | ICD-10-CM | POA: Diagnosis not present

## 2020-04-10 DIAGNOSIS — Z7951 Long term (current) use of inhaled steroids: Secondary | ICD-10-CM | POA: Diagnosis not present

## 2020-04-10 DIAGNOSIS — M797 Fibromyalgia: Secondary | ICD-10-CM | POA: Diagnosis not present

## 2020-04-10 DIAGNOSIS — J449 Chronic obstructive pulmonary disease, unspecified: Secondary | ICD-10-CM | POA: Diagnosis not present

## 2020-04-10 DIAGNOSIS — Z9841 Cataract extraction status, right eye: Secondary | ICD-10-CM | POA: Insufficient documentation

## 2020-04-17 ENCOUNTER — Other Ambulatory Visit: Payer: Medicare Other

## 2020-04-17 DIAGNOSIS — Z20822 Contact with and (suspected) exposure to covid-19: Secondary | ICD-10-CM | POA: Diagnosis not present

## 2020-04-19 LAB — SARS-COV-2, NAA 2 DAY TAT

## 2020-04-19 LAB — NOVEL CORONAVIRUS, NAA: SARS-CoV-2, NAA: NOT DETECTED

## 2020-04-30 ENCOUNTER — Other Ambulatory Visit: Payer: Self-pay | Admitting: Family Medicine

## 2020-05-26 ENCOUNTER — Telehealth: Payer: Self-pay | Admitting: Family Medicine

## 2020-05-26 NOTE — Telephone Encounter (Signed)
Left message for patient to call back and schedule Medicare Annual Wellness Visit (AWV) either virtually or in office.  Last AWV 10/15/16; please schedule at anytime with [facility] Nurse Health Advisor 1  This should be a 45 minute visit.

## 2020-06-06 DIAGNOSIS — Z23 Encounter for immunization: Secondary | ICD-10-CM | POA: Diagnosis not present

## 2020-06-12 DIAGNOSIS — J449 Chronic obstructive pulmonary disease, unspecified: Secondary | ICD-10-CM | POA: Diagnosis not present

## 2020-06-12 DIAGNOSIS — H2512 Age-related nuclear cataract, left eye: Secondary | ICD-10-CM | POA: Diagnosis not present

## 2020-06-13 DIAGNOSIS — Z961 Presence of intraocular lens: Secondary | ICD-10-CM | POA: Diagnosis not present

## 2020-06-13 DIAGNOSIS — Z48810 Encounter for surgical aftercare following surgery on the sense organs: Secondary | ICD-10-CM | POA: Diagnosis not present

## 2020-06-20 DIAGNOSIS — Z961 Presence of intraocular lens: Secondary | ICD-10-CM | POA: Diagnosis not present

## 2020-06-20 DIAGNOSIS — Z48810 Encounter for surgical aftercare following surgery on the sense organs: Secondary | ICD-10-CM | POA: Diagnosis not present

## 2020-06-25 DIAGNOSIS — Z23 Encounter for immunization: Secondary | ICD-10-CM | POA: Diagnosis not present

## 2020-06-27 ENCOUNTER — Other Ambulatory Visit: Payer: Self-pay | Admitting: Family Medicine

## 2020-06-27 MED ORDER — SILODOSIN 8 MG PO CAPS: 8.0000 mg | ORAL_CAPSULE | Freq: Every day | ORAL | 0 refills | Status: DC

## 2020-07-03 ENCOUNTER — Telehealth: Payer: Self-pay | Admitting: Family Medicine

## 2020-07-03 NOTE — Telephone Encounter (Addendum)
Patient is calling and requesting a refill for ALPRAZolam Ashley Castaneda) 0.5 MG tablet to be sent to [facility]  [place], [place] [address]  Phone:  [phone] Fax:  [phone]  CB is [phone]

## 2020-07-04 MED ORDER — ALPRAZOLAM 0.5 MG PO TABS
0.5000 mg | ORAL_TABLET | Freq: Two times a day (BID) | ORAL | 0 refills | Status: DC
Start: 2020-07-04 — End: 2020-09-11

## 2020-07-04 NOTE — Telephone Encounter (Signed)
RX faxed

## 2020-07-04 NOTE — Telephone Encounter (Signed)
Please advise about refill

## 2020-07-04 NOTE — Progress Notes (Deleted)
Subjective:   Ashley Castaneda is a 69 y.o. female who presents for Medicare Annual (Subsequent) preventive examination.    Review of Systems    N/A        Objective:    There were no vitals filed for this visit. There is no height or weight on file to calculate BMI.  Advanced Directives 05/06/2019 09/03/2017 08/17/2017 10/15/2016  Does Patient Have a Medical Advance Directive? No No No No  Would patient like information on creating a medical advance directive? No - Patient declined No - Patient declined - -    Current Medications (verified) Outpatient Encounter Medications as of 07/07/2020  Medication Sig  . albuterol (VENTOLIN HFA) 108 (90 Base) MCG/ACT inhaler Inhale 2 puffs into the lungs every 6 (six) hours as needed for wheezing or shortness of breath.  . ALPRAZolam (XANAX) 0.5 MG tablet Take 1 tablet (0.5 mg total) by mouth 2 (two) times daily.  Marland Kitchen ascorbic acid (VITAMIN C) 1000 MG tablet Take 1,000 mg by mouth daily.  Marland Kitchen aspirin 325 MG tablet daily.  . calcium carbonate (OS-CAL - DOSED IN MG OF ELEMENTAL CALCIUM) 1250 MG tablet Take 1 tablet by mouth daily.  . carboxymethylcellulose (REFRESH PLUS) 0.5 % SOLN Take 1 drop by mouth daily as needed. For dry eyes  . cetirizine (ZYRTEC) 10 MG tablet Take 10 mg by mouth daily.    . colchicine 0.6 MG tablet Take 1 tablet (0.6 mg total) by mouth daily.  . cycloSPORINE (RESTASIS) 0.05 % ophthalmic emulsion Place 1 drop into both eyes 2 (two) times daily.  Marland Kitchen dexamethasone 0.5 MG/5ML elixir Take two teaspoons and swish, gargle, and spit qid prn canker sores.  Marland Kitchen dronabinol (MARINOL) 5 MG capsule dronabinol 5 mg capsule  TAKE ONE CAPSULE BY MOUTH TWICE A DAY  . fluticasone (FLONASE) 50 MCG/ACT nasal spray Place 2 sprays into both nostrils daily as needed. For nasal congestion  . fluticasone furoate-vilanterol (BREO ELLIPTA) 100-25 MCG/INH AEPB Inhale 1 puff into the lungs daily.  . frovatriptan (FROVA) 2.5 MG tablet Take 2.5 mg by mouth as  needed. If recurs, may repeat after 2 hours. Max of 3 tabs in 24 hours. For migraines.  . Galcanezumab-gnlm 120 MG/ML SOSY Inject into the skin.  . hydrOXYzine (ATARAX) 10 MG tablet Take 10 mg by mouth every 8 (eight) hours as needed. For dizziness per Renaldo Reel, MD  . ketorolac (ACULAR) 0.4 % SOLN Place 1 drop into the left eye 4 (four) times daily.  Marland Kitchen lidocaine (LIDODERM) 5 % Place 1 patch onto the skin daily. Remove & Discard patch within 12 hours or as directed by MD  . meclizine (ANTIVERT) 25 MG tablet TAKE 1 TABLET BY MOUTH 4 TIMES A DAY AS NEEDED FOR DIZZINESS  . meloxicam (MOBIC) 15 MG tablet Take 1 tablet (15 mg total) by mouth daily.  . metaxalone (SKELAXIN) 800 MG tablet Take 800 mg by mouth 3 (three) times daily.    . metoclopramide (REGLAN) 10 MG tablet Take 10 mg by mouth 3 (three) times daily as needed. For nausea  . montelukast (SINGULAIR) 10 MG tablet TAKE 1 TABLET (10 MG TOTAL) BY MOUTH DAILY AT 6 PM.  . Multiple Vitamin (MULTIVITAMIN) tablet Take 1 tablet by mouth every morning.   Marland Kitchen ofloxacin (OCUFLOX) 0.3 % ophthalmic solution Place 1 drop into the left eye 4 (four) times daily.  . Omega-3 Fatty Acids (FISH OIL) 1200 MG CAPS Take 1 capsule by mouth every evening.  Meribeth Mattes  0.5-0.9 % ophthalmic solution SMARTSIG:1 Drop(s) In Eye(s) PRN  . potassium chloride (KLOR-CON) 10 MEQ tablet Take two tablets once daily  . promethazine (PHENERGAN) 25 MG tablet SMARTSIG:0.5-2 Tablet(s) By Mouth 3 Times Daily PRN  . RABEprazole (ACIPHEX) 20 MG tablet Take 1 tablet (20 mg total) by mouth daily.  . silodosin (RAPAFLO) 8 MG CAPS capsule Take 1 capsule (8 mg total) by mouth daily with breakfast.  . tiZANidine (ZANAFLEX) 2 MG tablet Take 2 mg by mouth at bedtime.  . torsemide (DEMADEX) 10 MG tablet Take 1 tablet (10 mg total) by mouth daily.  Marland Kitchen triamcinolone (KENALOG) 0.1 % paste Use as directed 1 application in the mouth or throat 2 (two) times daily.  . valACYclovir (VALTREX) 1000 MG tablet  Take 1 tablet (1,000 mg total) by mouth 3 (three) times daily.  . vitamin E 400 UNIT capsule Take 400 Units by mouth daily at 6 PM.    No facility-administered encounter medications on file as of 07/07/2020.    Allergies (verified) Cefazolin, Baclofen, Carbamazepine, Duloxetine, Fentanyl, Gabapentin, Naproxen, Oxcarbazepine, Pregabalin, Serotonin reuptake inhibitors (ssris), Topiramate, Venlafaxine, Zonisamide, Ceclor [cefaclor], Milnacipran, and Ondansetron   History: Past Medical History:  Diagnosis Date  . Adenomatous colon polyp 09/2008  . ALLERGIC RHINITIS 07/01/2008  . ANXIETY 07/01/2008  . BACK PAIN, THORACIC REGION 03/04/2010  . BRONCHITIS, CHRONIC 07/01/2008  . CARPAL TUNNEL SYNDROME, HX OF 07/01/2008  . CHRONIC OBSTRUCTIVE PULMONARY DISEASE, ACUTE EXACERBATION 08/07/2010  . DEPRESSION 07/01/2008  . DVT, HX OF 07/01/2008  . ECZEMA 05/29/2009  . FACIAL PAIN 12/25/2009  . FIBROMYALGIA 07/01/2008  . GERD 07/01/2008  . Hiatal hernia   . Hyperlipidemia   . LEG PAIN, BILATERAL 07/01/2008  . MIGRAINES, HX OF 07/01/2008  . OCCIPITAL NEURALGIA 07/01/2008  . OSTEOARTHRITIS 07/01/2008  . Other specified trigeminal nerve disorders 07/01/2008  . REFLEX SYMPATHETIC DYSTROPHY 07/01/2008  . Trigeminal neuralgia 05/21/2009  . Unspecified vitamin D deficiency 03/31/2010   Past Surgical History:  Procedure Laterality Date  . CARPAL TUNNEL RELEASE  2009   RIGHT  . HEAD CRANIECTOMY  1994   MICROVASULAR DECOMPRESSION  . LAVH     BSO  . LEFT KNEE SURGERY  1989  . OCCIPITAL NERVE STIMULATOR INSERTION    . RIGHT FOOT BONE SPUR  1987  . RIGHT FOOT SURGERY  1998   BAD CUT  . RIGHT JAW SURGERY  2002  . RIGHT KNEE SURGERY  1990  . [place]  . RIGHT THUMB SUGERY  2004  . TONSILLECTOMY  1957   Family History  Problem Relation Age of Onset  . Ovarian cancer Mother 62  . Hypertension Father   . Prostate cancer Father   . Heart attack Father   . Heart attack  Maternal Grandmother   . Stroke Paternal Grandmother    Social History   Socioeconomic History  . Marital status: Married    Spouse name: [name]  . Number of children: 0  . Years of education: college-3  . Highest education level: Not on file  Occupational History  . Occupation: disabled  Tobacco Use  . Smoking status: Former Smoker    Packs/day: 2.00    Years: 30.00    Pack years: 60.00    Types: Cigarettes    Start date: 53    Quit date: 02/04/2012    Years since quitting: 8.4  . Smokeless tobacco: Never Used  . Tobacco comment: nicotine in vapor and is very low nicotine  Vaping Use  . Vaping Use: Every day  Substance and Sexual Activity  . Alcohol use: No    Alcohol/week: 0.0 standard drinks    Comment: quit 2000  . Drug use: No  . Sexual activity: Never  Other Topics Concern  . Not on file  Social History Narrative   Patient lives at home with husband [name].    Patient has no children and 2 step children.    Patient is on disability since 87 for arthalgia.    Patient has 3 years of college.    Patient is right handed.    Social Determinants of Health   Financial Resource Strain: Not on file  Food Insecurity: Not on file  Transportation Needs: Not on file  Physical Activity: Not on file  Stress: Not on file  Social Connections: Not on file    Tobacco Counseling Counseling given: Not Answered Comment: nicotine in vapor and is very low nicotine    Clinical Intake:                 Diabetic?No          Activities of Daily Living No flowsheet data found.  Patient Care Team: Eulas Post, MD as PCP - General Jettie Booze, MD as PCP - Cardiology (Cardiology) Renaldo Reel, MD as Consulting Physician (Neurology)  Indicate any recent Medical Services you may have received from other than [facility] providers in the past year (date may be approximate).     Assessment:   This is a routine wellness examination for  Laurann.  Hearing/Vision screen No exam data present  Dietary issues and exercise activities discussed:    Goals    . Weight (lb) < 200 lb (90.7 kg)     Will continue your weight loss program  Good luck!      Depression Screen PHQ 2/9 Scores 10/15/2016 06/14/2013  PHQ - 2 Score 0 0    Fall Risk Fall Risk  06/15/2019 06/14/2018 10/15/2016 09/25/2015 06/14/2013  Falls in the past year? 0 0 No No No  Comment Emmi Telephone Survey: data to providers prior to load [employer] Telephone Survey: data to providers prior to load - - -    FALL RISK PREVENTION PERTAINING TO THE HOME:  Any stairs in or around the home? {YES/NO:21197} If so, are there any without handrails? No  Home free of loose throw rugs in walkways, pet beds, electrical cords, etc? Yes  Adequate lighting in your home to reduce risk of falls? Yes   ASSISTIVE DEVICES UTILIZED TO PREVENT FALLS:  Life alert? {YES/NO:21197} Use of a cane, walker or w/c? {YES/NO:21197} Grab bars in the bathroom? {YES/NO:21197} Shower chair or bench in shower? {YES/NO:21197} Elevated toilet seat or a handicapped toilet? {YES/NO:21197}  TIMED UP AND GO:  Was the test performed? {YES/NO:21197}.  Length of time to ambulate 10 feet: *** sec.   {Appearance of [id number]  Cognitive Function: MMSE - Mini Mental State Exam 10/15/2016  Not completed: (No Data)        Immunizations Immunization History  Administered Date(s) Administered  . Fluad Quad(high Dose 65+) 04/09/2019  . Influenza Split 04/30/2011, 05/04/2012  . Influenza Whole 06/10/2008, 03/31/2010  . Influenza, High Dose Seasonal PF 06/19/2018  . Influenza, Seasonal, Injecte, Preservative Fre 09/13/2015  . Influenza,inj,Quad PF,6+ Mos 06/14/2013, 03/28/2014  . Influenza,inj,quad, With Preservative 06/19/2018  . Influenza-Unspecified 07/26/2016, 05/04/2017  . Moderna SARS-COVID-2 Vaccination 09/27/2019, 11/02/2019  . Pneumococcal Conjugate-13 06/19/2018  . Pneumococcal  Polysaccharide-23 06/10/2008  .  Td 03/31/2010    TDAP status: Due, Education has been provided regarding the importance of this vaccine. Advised may receive this vaccine at local pharmacy or Health Dept. Aware to provide a copy of the vaccination record if obtained from local pharmacy or Health Dept. Verbalized acceptance and understanding.  {Flu Vaccine status:2101806}  Pneumococcal vaccine status: Due, Education has been provided regarding the importance of this vaccine. Advised may receive this vaccine at local pharmacy or Health Dept. Aware to provide a copy of the vaccination record if obtained from local pharmacy or Health Dept. Verbalized acceptance and understanding.  Covid-19 vaccine status: Completed vaccines  Qualifies for Shingles Vaccine? Yes   Zostavax completed No   Shingrix Completed?: No.    Education has been provided regarding the importance of this vaccine. Patient has been advised to call insurance company to determine out of pocket expense if they have not yet received this vaccine. Advised may also receive vaccine at local pharmacy or Health Dept. Verbalized acceptance and understanding.  Screening Tests Health Maintenance  Topic Date Due  . Hepatitis C Screening  Never done  . PNA vac Low Risk Adult (2 of 2 - PPSV23) 06/20/2019  . INFLUENZA VACCINE  02/24/2020  . TETANUS/TDAP  03/31/2020  . COVID-19 Vaccine (3 - Booster for Moderna series) 05/03/2020  . MAMMOGRAM  01/21/2021  . COLONOSCOPY  02/05/2025  . DEXA SCAN  Completed    Health Maintenance  Health Maintenance Due  Topic Date Due  . Hepatitis C Screening  Never done  . PNA vac Low Risk Adult (2 of 2 - PPSV23) 06/20/2019  . INFLUENZA VACCINE  02/24/2020  . TETANUS/TDAP  03/31/2020  . COVID-19 Vaccine (3 - Booster for Moderna series) 05/03/2020    Colorectal cancer screening: Type of screening: Colonoscopy. Completed 02/06/2015. Repeat every 10 years  Mammogram status: Completed 01/22/2020. Repeat  every year  Bone Density status: Completed 01/22/2020. Results reflect: Bone density results: OSTEOPENIA. Repeat every 2 years.  Lung Cancer Screening: (Low Dose CT Chest recommended if Age 93-80 years, 30 pack-year currently smoking OR have quit w/in 15years.) does qualify.   Lung Cancer Screening Referral: ***  Additional Screening:  Hepatitis C Screening: does qualify; Completed  Vision Screening: Recommended annual ophthalmology exams for early detection of glaucoma and other disorders of the eye. Is the patient up to date with their annual eye exam?  {YES/NO:21197} Who is the provider or what is the name of the office in which the patient attends annual eye exams? *** If pt is not established with a provider, would they like to be referred to a provider to establish care? {YES/NO:21197}.   Dental Screening: Recommended annual dental exams for proper oral hygiene  Community Resource Referral / Chronic Care Management: CRR required this visit?  No   CCM required this visit?  No      Plan:     I have personally reviewed and noted the following in the patient's chart:   . Medical and social history . Use of alcohol, tobacco or illicit drugs  . Current medications and supplements . Functional ability and status . Nutritional status . Physical activity . Advanced directives . List of other physicians . Hospitalizations, surgeries, and ER visits in previous 12 months . Vitals . Screenings to include cognitive, depression, and falls . Referrals and appointments  In addition, I have reviewed and discussed with patient certain preventive protocols, quality metrics, and best practice recommendations. A written personalized care plan for preventive services as  well as general preventive health recommendations were provided to patient.     Ofilia Neas, LPN   [id number]   Nurse Notes: None

## 2020-07-04 NOTE — Telephone Encounter (Signed)
I tried to sent the Rx electronically but it keeps printing out.   She gets her meds by [facility] and for some reason they have to be faxed (or at least have in the past).

## 2020-07-07 ENCOUNTER — Other Ambulatory Visit: Payer: Self-pay

## 2020-07-07 ENCOUNTER — Ambulatory Visit: Payer: Medicare Other

## 2020-07-07 DIAGNOSIS — Z0000 Encounter for general adult medical examination without abnormal findings: Secondary | ICD-10-CM | POA: Insufficient documentation

## 2020-07-07 NOTE — Progress Notes (Unsigned)
Erroneous encounter

## 2020-07-08 DIAGNOSIS — Z961 Presence of intraocular lens: Secondary | ICD-10-CM | POA: Diagnosis not present

## 2020-07-08 DIAGNOSIS — Z48810 Encounter for surgical aftercare following surgery on the sense organs: Secondary | ICD-10-CM | POA: Diagnosis not present

## 2020-07-14 ENCOUNTER — Ambulatory Visit (INDEPENDENT_AMBULATORY_CARE_PROVIDER_SITE_OTHER): Payer: Medicare Other

## 2020-07-14 ENCOUNTER — Other Ambulatory Visit: Payer: Self-pay

## 2020-07-14 DIAGNOSIS — Z0000 Encounter for general adult medical examination without abnormal findings: Secondary | ICD-10-CM | POA: Diagnosis not present

## 2020-07-14 NOTE — Patient Instructions (Signed)
Ms. Ashley Castaneda , Thank you for taking time to come for your Medicare Wellness Visit. I appreciate your ongoing commitment to your health goals. Please review the following plan we discussed and let me know if I can assist you in the future.   Screening recommendations/referrals: Colonoscopy: Up to date, next due 02/05/2025 Mammogram: Up to date, next due 01/21/2021 Bone Density: Up to date, next due 01/21/2022 Recommended yearly ophthalmology/optometry visit for glaucoma screening and checkup Recommended yearly dental visit for hygiene and checkup  Vaccinations: Influenza vaccine: Up to date, next due fall 2022  Pneumococcal vaccine: Currently due for Pneumovac 23 Tdap vaccine: Currently due, you may receive at our office or at your local pharmacy . Shingles vaccine: Currently due for your second Shingrix, please get when you are able to     Advanced directives: Advance directive discussed with you today. Even though you declined this today please call our office should you change your mind and we can give you the proper paperwork for you to fill out.   Conditions/risks identified: None   Next appointment: 07/21/2020 @ 11:00 am with Dr. Elease Hashimoto    Preventive Care 68 Years and Older, Female Preventive care refers to lifestyle choices and visits with your health care provider that can promote health and wellness. What does preventive care include?  A yearly physical exam. This is also called an annual well check.  Dental exams once or twice a year.  Routine eye exams. Ask your health care provider how often you should have your eyes checked.  Personal lifestyle choices, including:  Daily care of your teeth and gums.  Regular physical activity.  Eating a healthy diet.  Avoiding tobacco and drug use.  Limiting alcohol use.  Practicing safe sex.  Taking low-dose aspirin every day.  Taking vitamin and mineral supplements as recommended by your health care provider. What  happens during an annual well check? The services and screenings done by your health care provider during your annual well check will depend on your age, overall health, lifestyle risk factors, and family history of disease. Counseling  Your health care provider may ask you questions about your:  Alcohol use.  Tobacco use.  Drug use.  Emotional well-being.  Home and relationship well-being.  Sexual activity.  Eating habits.  History of falls.  Memory and ability to understand (cognition).  Work and work Statistician.  Reproductive health. Screening  You may have the following tests or measurements:  Height, weight, and BMI.  Blood pressure.  Lipid and cholesterol levels. These may be checked every 5 years, or more frequently if you are over 44 years old.  Skin check.  Lung cancer screening. You may have this screening every year starting at age 51 if you have a 30-pack-year history of smoking and currently smoke or have quit within the past 15 years.  Fecal occult blood test (FOBT) of the stool. You may have this test every year starting at age 42.  Flexible sigmoidoscopy or colonoscopy. You may have a sigmoidoscopy every 5 years or a colonoscopy every 10 years starting at age 68.  Hepatitis C blood test.  Hepatitis B blood test.  Sexually transmitted disease (STD) testing.  Diabetes screening. This is done by checking your blood sugar (glucose) after you have not eaten for a while (fasting). You may have this done every 1-3 years.  Bone density scan. This is done to screen for osteoporosis. You may have this done starting at age 68.  Mammogram. This may  be done every 1-2 years. Talk to your health care provider about how often you should have regular mammograms. Talk with your health care provider about your test results, treatment options, and if necessary, the need for more tests. Vaccines  Your health care provider may recommend certain vaccines, such  as:  Influenza vaccine. This is recommended every year.  Tetanus, diphtheria, and acellular pertussis (Tdap, Td) vaccine. You may need a Td booster every 10 years.  Zoster vaccine. You may need this after age 68.  Pneumococcal 13-valent conjugate (PCV13) vaccine. One dose is recommended after age 21.  Pneumococcal polysaccharide (PPSV23) vaccine. One dose is recommended after age 39. Talk to your health care provider about which screenings and vaccines you need and how often you need them. This information is not intended to replace advice given to you by your health care provider. Make sure you discuss any questions you have with your health care provider. Document Released: 08/08/2015 Document Revised: 03/31/2016 Document Reviewed: 05/13/2015 Elsevier Interactive Patient Education  2017 [place] Prevention in the Home Falls can cause injuries. They can happen to people of all ages. There are many things you can do to make your home safe and to help prevent falls. What can I do on the outside of my home?  Regularly fix the edges of walkways and driveways and fix any cracks.  Remove anything that might make you trip as you walk through a door, such as a raised step or threshold.  Trim any bushes or trees on the path to your home.  Use bright outdoor lighting.  Clear any walking paths of anything that might make someone trip, such as rocks or tools.  Regularly check to see if handrails are loose or broken. Make sure that both sides of any steps have handrails.  Any raised decks and porches should have guardrails on the edges.  Have any leaves, snow, or ice cleared regularly.  Use sand or salt on walking paths during winter.  Clean up any spills in your garage right away. This includes oil or grease spills. What can I do in the bathroom?  Use night lights.  Install grab bars by the toilet and in the tub and shower. Do not use towel bars as grab bars.  Use  non-skid mats or decals in the tub or shower.  If you need to sit down in the shower, use a plastic, non-slip stool.  Keep the floor dry. Clean up any water that spills on the floor as soon as it happens.  Remove soap buildup in the tub or shower regularly.  Attach bath mats securely with double-sided non-slip rug tape.  Do not have throw rugs and other things on the floor that can make you trip. What can I do in the bedroom?  Use night lights.  Make sure that you have a light by your bed that is easy to reach.  Do not use any sheets or blankets that are too big for your bed. They should not hang down onto the floor.  Have a firm chair that has side arms. You can use this for support while you get dressed.  Do not have throw rugs and other things on the floor that can make you trip. What can I do in the kitchen?  Clean up any spills right away.  Avoid walking on wet floors.  Keep items that you use a lot in easy-to-reach places.  If you need to reach something above you,  use a strong step stool that has a grab bar.  Keep electrical cords out of the way.  Do not use floor polish or wax that makes floors slippery. If you must use wax, use non-skid floor wax.  Do not have throw rugs and other things on the floor that can make you trip. What can I do with my stairs?  Do not leave any items on the stairs.  Make sure that there are handrails on both sides of the stairs and use them. Fix handrails that are broken or loose. Make sure that handrails are as long as the stairways.  Check any carpeting to make sure that it is firmly attached to the stairs. Fix any carpet that is loose or worn.  Avoid having throw rugs at the top or bottom of the stairs. If you do have throw rugs, attach them to the floor with carpet tape.  Make sure that you have a light switch at the top of the stairs and the bottom of the stairs. If you do not have them, ask someone to add them for you. What  else can I do to help prevent falls?  Wear shoes that:  Do not have high heels.  Have rubber bottoms.  Are comfortable and fit you well.  Are closed at the toe. Do not wear sandals.  If you use a stepladder:  Make sure that it is fully opened. Do not climb a closed stepladder.  Make sure that both sides of the stepladder are locked into place.  Ask someone to hold it for you, if possible.  Clearly mark and make sure that you can see:  Any grab bars or handrails.  First and last steps.  Where the edge of each step is.  Use tools that help you move around (mobility aids) if they are needed. These include:  Canes.  Walkers.  Scooters.  Crutches.  Turn on the lights when you go into a dark area. Replace any light bulbs as soon as they burn out.  Set up your furniture so you have a clear path. Avoid moving your furniture around.  If any of your floors are uneven, fix them.  If there are any pets around you, be aware of where they are.  Review your medicines with your doctor. Some medicines can make you feel dizzy. This can increase your chance of falling. Ask your doctor what other things that you can do to help prevent falls. This information is not intended to replace advice given to you by your health care provider. Make sure you discuss any questions you have with your health care provider. Document Released: 05/08/2009 Document Revised: 12/18/2015 Document Reviewed: 08/16/2014 Elsevier Interactive Patient Education  2017 [employer].

## 2020-07-14 NOTE — Progress Notes (Signed)
Subjective:   Ashley Castaneda is a 69 y.o. female who presents for Medicare Annual (Subsequent) preventive examination.  Virtual Visit via Video Note  I connected with IYONNAH FERRANTE on 07/14/20 at  1:15 PM EST by a video enabled telemedicine application and verified that I am speaking with the correct person using two identifiers.  Location: Patient: home  Provider: Office    I discussed the limitations of evaluation and management by telemedicine and the availability of in person appointments. The patient expressed understanding and agreed to proceed.  Ofilia Neas, LPN   Review of Systems    N/A  Cardiac Risk Factors include: advanced age (>71men, >69 women);dyslipidemia     Objective:    Today's Vitals   07/14/20 1321  PainSc: 8    There is no height or weight on file to calculate BMI.  Advanced Directives 07/14/2020 05/06/2019 09/03/2017 08/17/2017 10/15/2016  Does Patient Have a Medical Advance Directive? No No No No No  Would patient like information on creating a medical advance directive? No - Patient declined No - Patient declined No - Patient declined - -    Current Medications (verified) Outpatient Encounter Medications as of 07/14/2020  Medication Sig  . albuterol (VENTOLIN HFA) 108 (90 Base) MCG/ACT inhaler Inhale 2 puffs into the lungs every 6 (six) hours as needed for wheezing or shortness of breath.  . ALPRAZolam (XANAX) 0.5 MG tablet Take 1 tablet (0.5 mg total) by mouth 2 (two) times daily.  Marland Kitchen ascorbic acid (VITAMIN C) 1000 MG tablet Take 1,000 mg by mouth daily.  Marland Kitchen aspirin 325 MG tablet daily.  . calcium carbonate (OS-CAL - DOSED IN MG OF ELEMENTAL CALCIUM) 1250 MG tablet Take 1 tablet by mouth daily.  . carboxymethylcellulose (REFRESH PLUS) 0.5 % SOLN Take 1 drop by mouth daily as needed. For dry eyes  . cetirizine (ZYRTEC) 10 MG tablet Take 10 mg by mouth daily.  . cycloSPORINE (RESTASIS) 0.05 % ophthalmic emulsion Place 1 drop into both eyes 2 (two)  times daily.  Marland Kitchen dronabinol (MARINOL) 5 MG capsule dronabinol 5 mg capsule  TAKE ONE CAPSULE BY MOUTH TWICE A DAY  . fluticasone (FLONASE) 50 MCG/ACT nasal spray Place 2 sprays into both nostrils daily as needed. For nasal congestion  . fluticasone furoate-vilanterol (BREO ELLIPTA) 100-25 MCG/INH AEPB Inhale 1 puff into the lungs daily.  . frovatriptan (FROVA) 2.5 MG tablet Take 2.5 mg by mouth as needed. If recurs, may repeat after 2 hours. Max of 3 tabs in 24 hours. For migraines.  Marland Kitchen Galcanezumab-gnlm (EMGALITY) 120 MG/ML SOAJ Inject into the skin.  . Galcanezumab-gnlm 120 MG/ML SOSY Inject into the skin.  Marland Kitchen ketorolac (ACULAR) 0.4 % SOLN Place 1 drop into the left eye 4 (four) times daily.  Marland Kitchen lidocaine (LIDODERM) 5 % Place 1 patch onto the skin daily. Remove & Discard patch within 12 hours or as directed by MD  . meclizine (ANTIVERT) 25 MG tablet TAKE 1 TABLET BY MOUTH 4 TIMES A DAY AS NEEDED FOR DIZZINESS  . meloxicam (MOBIC) 15 MG tablet Take 1 tablet (15 mg total) by mouth daily.  . metaxalone (SKELAXIN) 800 MG tablet Take 800 mg by mouth 3 (three) times daily.  . montelukast (SINGULAIR) 10 MG tablet TAKE 1 TABLET (10 MG TOTAL) BY MOUTH DAILY AT 6 PM.  . Multiple Vitamin (MULTIVITAMIN) tablet Take 1 tablet by mouth every morning.  . Omega-3 Fatty Acids (FISH OIL) 1200 MG CAPS Take 1 capsule by mouth every  evening.  . OPTIVE 0.5-0.9 % ophthalmic solution SMARTSIG:1 Drop(s) In Eye(s) PRN  . potassium chloride (KLOR-CON) 10 MEQ tablet Take two tablets once daily  . RABEprazole (ACIPHEX) 20 MG tablet Take 1 tablet (20 mg total) by mouth daily.  . Rimegepant Sulfate (NURTEC) 75 MG TBDP Take by mouth.  . silodosin (RAPAFLO) 8 MG CAPS capsule Take 1 capsule (8 mg total) by mouth daily with breakfast.  . tiZANidine (ZANAFLEX) 2 MG tablet Take 2 mg by mouth at bedtime.  . torsemide (DEMADEX) 10 MG tablet Take 1 tablet (10 mg total) by mouth daily.  . vitamin E 400 UNIT capsule Take 400 Units by  mouth daily at 6 PM.  . colchicine 0.6 MG tablet Take 1 tablet (0.6 mg total) by mouth daily. (Patient not taking: Reported on 07/14/2020)  . dexamethasone 0.5 MG/5ML elixir Take two teaspoons and swish, gargle, and spit qid prn canker sores. (Patient not taking: Reported on 07/14/2020)  . hydrOXYzine (ATARAX) 10 MG tablet Take 10 mg by mouth every 8 (eight) hours as needed. For dizziness per Renaldo Reel, MD (Patient not taking: Reported on 07/14/2020)  . metoclopramide (REGLAN) 10 MG tablet Take 10 mg by mouth 3 (three) times daily as needed. For nausea (Patient not taking: Reported on 07/14/2020)  . promethazine (PHENERGAN) 25 MG tablet SMARTSIG:0.5-2 Tablet(s) By Mouth 3 Times Daily PRN (Patient not taking: Reported on 07/14/2020)  . triamcinolone (KENALOG) 0.1 % paste Use as directed 1 application in the mouth or throat 2 (two) times daily. (Patient not taking: Reported on 07/14/2020)  . valACYclovir (VALTREX) 1000 MG tablet Take 1 tablet (1,000 mg total) by mouth 3 (three) times daily. (Patient not taking: Reported on 07/14/2020)  . [DISCONTINUED] ofloxacin (OCUFLOX) 0.3 % ophthalmic solution Place 1 drop into the left eye 4 (four) times daily.   No facility-administered encounter medications on file as of 07/14/2020.    Allergies (verified) Cefazolin, Baclofen, Carbamazepine, Duloxetine, Fentanyl, Gabapentin, Naproxen, Oxcarbazepine, Pregabalin, Serotonin reuptake inhibitors (ssris), Topiramate, Venlafaxine, Zonisamide, Ceclor [cefaclor], Milnacipran, and Ondansetron   History: Past Medical History:  Diagnosis Date  . Adenomatous colon polyp 09/2008  . ALLERGIC RHINITIS 07/01/2008  . ANXIETY 07/01/2008  . BACK PAIN, THORACIC REGION 03/04/2010  . BRONCHITIS, CHRONIC 07/01/2008  . CARPAL TUNNEL SYNDROME, HX OF 07/01/2008  . CHRONIC OBSTRUCTIVE PULMONARY DISEASE, ACUTE EXACERBATION 08/07/2010  . DEPRESSION 07/01/2008  . DVT, HX OF 07/01/2008  . ECZEMA 05/29/2009  . FACIAL PAIN 12/25/2009  .  FIBROMYALGIA 07/01/2008  . GERD 07/01/2008  . Hiatal hernia   . Hyperlipidemia   . LEG PAIN, BILATERAL 07/01/2008  . MIGRAINES, HX OF 07/01/2008  . OCCIPITAL NEURALGIA 07/01/2008  . OSTEOARTHRITIS 07/01/2008  . Other specified trigeminal nerve disorders 07/01/2008  . REFLEX SYMPATHETIC DYSTROPHY 07/01/2008  . Trigeminal neuralgia 05/21/2009  . Unspecified vitamin D deficiency 03/31/2010   Past Surgical History:  Procedure Laterality Date  . CARPAL TUNNEL RELEASE  2009   RIGHT  . HEAD CRANIECTOMY  1994   MICROVASULAR DECOMPRESSION  . LAVH     BSO  . LEFT KNEE SURGERY  1989  . OCCIPITAL NERVE STIMULATOR INSERTION    . RIGHT FOOT BONE SPUR  1987  . RIGHT FOOT SURGERY  1998   BAD CUT  . RIGHT JAW SURGERY  2002  . RIGHT KNEE SURGERY  1990  . [facility]  . RIGHT THUMB SUGERY  2004  . TONSILLECTOMY  1957   Family History  Problem  Relation Age of Onset  . Ovarian cancer Mother 64  . Hypertension Father   . Prostate cancer Father   . Heart attack Father   . Heart attack Maternal Grandmother   . Stroke Paternal Grandmother    Social History   Socioeconomic History  . Marital status: Married    Spouse name: [name]  . Number of children: 0  . Years of education: college-3  . Highest education level: Not on file  Occupational History  . Occupation: disabled  Tobacco Use  . Smoking status: Former Smoker    Packs/day: 2.00    Years: 30.00    Pack years: 60.00    Types: Cigarettes    Start date: 33    Quit date: 02/04/2012    Years since quitting: 8.4  . Smokeless tobacco: Never Used  . Tobacco comment: nicotine in vapor and is very low nicotine   Vaping Use  . Vaping Use: Every day  Substance and Sexual Activity  . Alcohol use: No    Alcohol/week: 0.0 standard drinks    Comment: quit 2000  . Drug use: No  . Sexual activity: Never  Other Topics Concern  . Not on file  Social History Narrative   Patient lives at home with husband [name].     Patient has no children and 2 step children.    Patient is on disability since 38 for arthalgia.    Patient has 3 years of college.    Patient is right handed.    Social Determinants of Health   Financial Resource Strain: Low Risk   . Difficulty of Paying Living Expenses: Not hard at all  Food Insecurity: No Food Insecurity  . Worried About Charity fundraiser in the Last Year: Never true  . Ran Out of Food in the Last Year: Never true  Transportation Needs: No Transportation Needs  . Lack of Transportation (Medical): No  . Lack of Transportation (Non-Medical): No  Physical Activity: Inactive  . Days of Exercise per Week: 0 days  . Minutes of Exercise per Session: 0 min  Stress: No Stress Concern Present  . Feeling of Stress : Not at all  Social Connections: Moderately Isolated  . Frequency of Communication with Friends and Family: More than three times a week  . Frequency of Social Gatherings with Friends and Family: Once a week  . Attends Religious Services: Never  . Active Member of Clubs or Organizations: No  . Attends Archivist Meetings: Never  . Marital Status: Married    Tobacco Counseling Counseling given: Not Answered Comment: nicotine in vapor and is very low nicotine    Clinical Intake:  Pre-visit preparation completed: Yes  Pain : 0-10 Pain Score: 8  Pain Type: Chronic pain Pain Location: Head Pain Descriptors / Indicators: Aching Pain Onset: More than a month ago Pain Frequency: Intermittent     Nutritional Risks: Nausea/ vomitting/ diarrhea (Nausea) Diabetes: No  How often do you need to have someone help you when you read instructions, pamphlets, or other written materials from your doctor or pharmacy?: 1 - Never What is the last grade level you completed in school?: 3 years college  Diabetic?No  Interpreter Needed?: No  Information entered by :: [place] of Daily Living In your present state of health, do you  have any difficulty performing the following activities: 07/14/2020  Hearing? Y  Comment has complete hearing loss in left ear  Vision? N  Difficulty concentrating or making decisions?  N  Walking or climbing stairs? N  Dressing or bathing? N  Doing errands, shopping? N  Preparing Food and eating ? N  Using the Toilet? N  In the past six months, have you accidently leaked urine? Y  Do you have problems with loss of bowel control? N  Managing your Medications? N  Managing your Finances? N  Housekeeping or managing your Housekeeping? N  Some recent data might be hidden    Patient Care Team: Eulas Post, MD as PCP - General Jettie Booze, MD as PCP - Cardiology (Cardiology) Renaldo Reel, MD as Consulting Physician (Neurology)  Indicate any recent Medical Services you may have received from other than [facility] providers in the past year (date may be approximate).     Assessment:   This is a routine wellness examination for [place].  Hearing/Vision screen  Hearing Screening   125Hz  250Hz  500Hz  1000Hz  2000Hz  3000Hz  4000Hz  6000Hz  8000Hz   Right ear:           Left ear:           Vision Screening Comments: Patient states gets eyes checked once per year. Has had recent eye exam and cataracts   Dietary issues and exercise activities discussed: Current Exercise Habits: The patient does not participate in regular exercise at present  Goals    . Exercise 3x per week (30 min per time)    . Weight (lb) < 200 lb (90.7 kg)     Will continue your weight loss program  Good luck!      Depression Screen PHQ 2/9 Scores 07/14/2020 10/15/2016 06/14/2013  PHQ - 2 Score 0 0 0  PHQ- 9 Score 0 - -    Fall Risk Fall Risk  07/14/2020 06/15/2019 06/14/2018 10/15/2016 09/25/2015  Falls in the past year? 1 0 0 No No  Comment - Emmi Telephone Survey: data to providers prior to load Emmi Telephone Survey: data to providers prior to load - -  Number falls in past yr: 0 - - - -  Injury with  Fall? 1 - - - -  Risk for fall due to : History of fall(s);Impaired balance/gait - - - -  Follow up Falls evaluation completed;Falls prevention discussed - - - -    FALL RISK PREVENTION PERTAINING TO THE HOME:  Any stairs in or around the home? Yes  If so, are there any without handrails? No  Home free of loose throw rugs in walkways, pet beds, electrical cords, etc? Yes  Adequate lighting in your home to reduce risk of falls? Yes   ASSISTIVE DEVICES UTILIZED TO PREVENT FALLS:  Life alert? No  Use of a cane, walker or w/c? Yes  Grab bars in the bathroom? Yes  Shower chair or bench in shower? No  Elevated toilet seat or a handicapped toilet? Yes     Cognitive Function: Normal cognitive status assessed by direct observation by this Nurse Health Advisor. No abnormalities found.   MMSE - Mini Mental State Exam 10/15/2016  Not completed: (No Data)        Immunizations Immunization History  Administered Date(s) Administered  . Fluad Quad(high Dose 65+) 04/09/2019  . Influenza Split 04/30/2011, 05/04/2012  . Influenza Whole 06/10/2008, 03/31/2010  . Influenza, High Dose Seasonal PF 06/19/2018  . Influenza, Seasonal, Injecte, Preservative Fre 09/13/2015  . Influenza,inj,Quad PF,6+ Mos 06/14/2013, 03/28/2014  . Influenza,inj,quad, With Preservative 06/19/2018  . Influenza-Unspecified 07/26/2016, 05/04/2017, 05/26/2020  . Moderna Sars-Covid-2 Vaccination 09/27/2019, 11/02/2019, 06/27/2020  . Pneumococcal  Conjugate-13 06/19/2018  . Pneumococcal Polysaccharide-23 06/10/2008  . Td 03/31/2010  . Zoster Recombinat (Shingrix) 12/18/2019    TDAP status: Due, Education has been provided regarding the importance of this vaccine. Advised may receive this vaccine at local pharmacy or Health Dept. Aware to provide a copy of the vaccination record if obtained from local pharmacy or Health Dept. Verbalized acceptance and understanding.  Flu Vaccine status: Up to date  Pneumococcal vaccine  status: Due, Education has been provided regarding the importance of this vaccine. Advised may receive this vaccine at local pharmacy or Health Dept. Aware to provide a copy of the vaccination record if obtained from local pharmacy or Health Dept. Verbalized acceptance and understanding.  Covid-19 vaccine status: Completed vaccines  Qualifies for Shingles Vaccine? Yes   Zostavax completed No   Shingrix Completed?: Yes  Screening Tests Health Maintenance  Topic Date Due  . Hepatitis C Screening  Never done  . PNA vac Low Risk Adult (2 of 2 - PPSV23) 06/20/2019  . TETANUS/TDAP  03/31/2020  . MAMMOGRAM  01/21/2021  . COLONOSCOPY  02/05/2025  . INFLUENZA VACCINE  Completed  . DEXA SCAN  Completed  . COVID-19 Vaccine  Completed    Health Maintenance  Health Maintenance Due  Topic Date Due  . Hepatitis C Screening  Never done  . PNA vac Low Risk Adult (2 of 2 - PPSV23) 06/20/2019  . TETANUS/TDAP  03/31/2020    Colorectal cancer screening: Type of screening: Colonoscopy. Completed 02/06/2015. Repeat every 10 years  Mammogram status: Completed 01/22/2020. Repeat every year  Bone Density status: Completed 01/22/2020. Results reflect: Bone density results: OSTEOPENIA. Repeat every 2 years.  Lung Cancer Screening: (Low Dose CT Chest recommended if Age 63-80 years, 30 pack-year currently smoking OR have quit w/in 15years.) does not qualify.   Lung Cancer Screening Referral: N/A   Additional Screening:  Hepatitis C Screening: does qualify;   Vision Screening: Recommended annual ophthalmology exams for early detection of glaucoma and other disorders of the eye. Is the patient up to date with their annual eye exam?  Yes  Who is the provider or what is the name of the office in which the patient attends annual eye exams? Dr. Melina Fiddler  If pt is not established with a provider, would they like to be referred to a provider to establish care? No .   Dental Screening: Recommended annual  dental exams for proper oral hygiene  Community Resource Referral / Chronic Care Management: CRR required this visit?  No   CCM required this visit?  No      Plan:     I have personally reviewed and noted the following in the patient's chart:   . Medical and social history . Use of alcohol, tobacco or illicit drugs  . Current medications and supplements . Functional ability and status . Nutritional status . Physical activity . Advanced directives . List of other physicians . Hospitalizations, surgeries, and ER visits in previous 12 months . Vitals . Screenings to include cognitive, depression, and falls . Referrals and appointments  In addition, I have reviewed and discussed with patient certain preventive protocols, quality metrics, and best practice recommendations. A written personalized care plan for preventive services as well as general preventive health recommendations were provided to patient.     Ofilia Neas, LPN   [id number]/[id number]   Nurse Notes: None

## 2020-07-17 DIAGNOSIS — G43719 Chronic migraine without aura, intractable, without status migrainosus: Secondary | ICD-10-CM | POA: Diagnosis not present

## 2020-07-17 DIAGNOSIS — G43119 Migraine with aura, intractable, without status migrainosus: Secondary | ICD-10-CM | POA: Diagnosis not present

## 2020-07-17 DIAGNOSIS — G509 Disorder of trigeminal nerve, unspecified: Secondary | ICD-10-CM | POA: Diagnosis not present

## 2020-07-17 DIAGNOSIS — G9050 Complex regional pain syndrome I, unspecified: Secondary | ICD-10-CM | POA: Diagnosis not present

## 2020-07-17 DIAGNOSIS — R52 Pain, unspecified: Secondary | ICD-10-CM | POA: Diagnosis not present

## 2020-07-17 DIAGNOSIS — B0229 Other postherpetic nervous system involvement: Secondary | ICD-10-CM | POA: Diagnosis not present

## 2020-07-20 ENCOUNTER — Other Ambulatory Visit: Payer: Self-pay | Admitting: Family Medicine

## 2020-07-21 ENCOUNTER — Encounter: Payer: Self-pay | Admitting: Family Medicine

## 2020-07-21 ENCOUNTER — Ambulatory Visit (INDEPENDENT_AMBULATORY_CARE_PROVIDER_SITE_OTHER): Payer: Medicare Other | Admitting: Family Medicine

## 2020-07-21 ENCOUNTER — Other Ambulatory Visit: Payer: Self-pay

## 2020-07-21 VITALS — BP 130/84 | HR 75 | Ht 63.0 in | Wt 234.0 lb

## 2020-07-21 DIAGNOSIS — K120 Recurrent oral aphthae: Secondary | ICD-10-CM

## 2020-07-21 DIAGNOSIS — Z23 Encounter for immunization: Secondary | ICD-10-CM

## 2020-07-21 DIAGNOSIS — R600 Localized edema: Secondary | ICD-10-CM | POA: Diagnosis not present

## 2020-07-21 DIAGNOSIS — E785 Hyperlipidemia, unspecified: Secondary | ICD-10-CM

## 2020-07-21 DIAGNOSIS — K219 Gastro-esophageal reflux disease without esophagitis: Secondary | ICD-10-CM

## 2020-07-21 LAB — BASIC METABOLIC PANEL
BUN: 18 mg/dL (ref 6–23)
CO2: 29 mEq/L (ref 19–32)
Calcium: 9.3 mg/dL (ref 8.4–10.5)
Chloride: 100 mEq/L (ref 96–112)
Creatinine, Ser: 1.01 mg/dL (ref 0.40–1.20)
GFR: 57.18 mL/min — ABNORMAL LOW (ref >60.00)
Glucose, Bld: 94 mg/dL (ref 70–99)
Potassium: 4.5 mEq/L (ref 3.5–5.1)
Sodium: 137 mEq/L (ref 135–145)

## 2020-07-21 LAB — HEPATIC FUNCTION PANEL
ALT: 22 U/L (ref 0–35)
AST: 18 U/L (ref 0–37)
Albumin: 4.5 g/dL (ref 3.5–5.2)
Alkaline Phosphatase: 69 U/L (ref 39–117)
Bilirubin, Direct: 0.1 mg/dL (ref 0.0–0.3)
Total Bilirubin: 0.4 mg/dL (ref 0.2–1.2)
Total Protein: 6.8 g/dL (ref 6.0–8.3)

## 2020-07-21 LAB — LIPID PANEL
Cholesterol: 235 mg/dL — ABNORMAL HIGH (ref 0–200)
HDL: 59.5 mg/dL (ref >39.00)
LDL Cholesterol: 144 mg/dL — ABNORMAL HIGH (ref 0–99)
NonHDL: 175.86
Total CHOL/HDL Ratio: 4
Triglycerides: 159 mg/dL — ABNORMAL HIGH (ref 0.0–149.0)
VLDL: 31.8 mg/dL (ref 0.0–40.0)

## 2020-07-21 MED ORDER — SILODOSIN 8 MG PO CAPS: 8.0000 mg | ORAL_CAPSULE | Freq: Every day | ORAL | 0 refills | Status: DC

## 2020-07-21 MED ORDER — RABEPRAZOLE SODIUM 20 MG PO TBEC: 20.0000 mg | DELAYED_RELEASE_TABLET | Freq: Every day | ORAL | 3 refills | Status: DC

## 2020-07-21 MED ORDER — FLUTICASONE PROPIONATE 50 MCG/ACT NA SUSP: 2.0000 | Freq: Every day | NASAL | 3 refills | Status: DC | PRN

## 2020-07-21 MED ORDER — MONTELUKAST SODIUM 10 MG PO TABS
ORAL_TABLET | ORAL | 3 refills | Status: DC
Start: 2020-07-21 — End: 2021-08-28

## 2020-07-21 MED ORDER — BREO ELLIPTA 100-25 MCG/INH IN AEPB: 1.0000 | INHALATION_SPRAY | Freq: Every day | RESPIRATORY_TRACT | 3 refills | Status: DC

## 2020-07-21 MED ORDER — MELOXICAM 15 MG PO TABS
15.0000 mg | ORAL_TABLET | Freq: Every day | ORAL | 3 refills | Status: DC
Start: 2020-07-21 — End: 2021-08-28

## 2020-07-21 MED ORDER — ALBUTEROL SULFATE HFA 108 (90 BASE) MCG/ACT IN AERS: 2.0000 | INHALATION_SPRAY | Freq: Four times a day (QID) | RESPIRATORY_TRACT | 3 refills | Status: DC | PRN

## 2020-07-21 MED ORDER — POTASSIUM CHLORIDE ER 10 MEQ PO TBCR
EXTENDED_RELEASE_TABLET | ORAL | 3 refills | Status: DC
Start: 2020-07-21 — End: 2021-01-19

## 2020-07-21 MED ORDER — TORSEMIDE 10 MG PO TABS: 10.0000 mg | ORAL_TABLET | Freq: Every day | ORAL | 3 refills | Status: DC

## 2020-07-21 NOTE — Patient Instructions (Signed)
Canker Sores  Canker sores are small, painful sores that develop inside your mouth. You can get one or more canker sores on the inside of your lips or cheeks, on your tongue, or anywhere inside your mouth. Canker sores cannot be passed from person to person (are not contagious). These sores are different from the sores that you may get on the outside of your lips (cold sores or fever blisters). What are the causes? The cause of this condition is not known. The condition may be passed down from a parent (genetic). What increases the risk? This condition is more likely to develop in:  Women.  People in their teens or 20s.  Women who are having their menstrual period.  People who are under a lot of emotional stress.  People who do not get enough iron or B vitamins.  People who do not take care of their mouth and teeth (have poor oral hygiene).  People who have an injury inside the mouth, such as after having dental work or from chewing something hard. What are the signs or symptoms? Canker sores usually start as painful red bumps. Then they turn into small white, yellow, or gray sores that have red borders. The sores may be painful, and the pain may get worse when you eat or drink. Along with the canker sore, symptoms may also include:  Fever.  Fatigue.  Swollen lymph nodes in your neck. How is this diagnosed? This condition may be diagnosed based on your symptoms and an exam of the inside of your mouth. If you get canker sores often or if they are very bad, you may have tests, such as:  Blood tests to rule out possible causes.  Swabbing a fluid sample from the sore to be tested for infection.  Removing a small tissue sample from the sore (biopsy) to test it for cancer. How is this treated? Most canker sores go away without treatment in about 68 week. Home care is usually the only treatment that you will need. Over-the-counter medicines can relieve discomfort. If you have severe  canker sores, your health care provider may prescribe:  Numbing ointment to relieve pain. ? Do not use numbing gels or products containing benzocaine in children who are 68 years of age or younger.  Vitamins.  Steroid medicines. These may be given as pills, mouth rinses, or gels.  Antibiotic mouth rinse. Follow these instructions at home:   Apply, take, or use over-the-counter and prescription medicines only as told by your health care provider. These include vitamins and ointments.  If you were prescribed an antibiotic mouth rinse, use it as told by your health care provider. Do not stop using the antibiotic even if your condition improves.  Until the sores are healed: ? Do not drink coffee or citrus juices. ? Do not eat spicy or salty foods.  Use a mild, over-the-counter mouth rinse as recommended by your health care provider.  Practice good oral hygiene by: ? Flossing your teeth every day. ? Brushing your teeth with a soft toothbrush twice each day. Contact a health care provider if:  Your symptoms do not get better after 2 weeks.  You also have a fever or swollen glands in your neck.  You get canker sores often.  You have a canker sore that is getting larger.  You cannot eat or drink due to your canker sores. Summary  Canker sores are small, painful sores that develop inside your mouth.  Canker sores usually start as painful   red bumps that turn into small white, yellow, or gray sores that have red borders.  The sores may be quite painful, and the pain may get worse when you eat or drink.  Most canker sores clear up without treatment in about 68 week. Home care is usually the only treatment that you will need. Over-the-counter medicines can relieve discomfort. This information is not intended to replace advice given to you by your health care provider. Make sure you discuss any questions you have with your health care provider. Document Revised: 06/24/2017 Document  Reviewed: 04/18/2017 Elsevier Patient Education  2020 [employer]  

## 2020-07-21 NOTE — Progress Notes (Signed)
Established Patient Office Visit  Subjective:  Patient ID: Ashley Castaneda, female    DOB: 1951-06-16  Age: 69 y.o. MRN: [id number]  CC:  Chief Complaint  Patient presents with  . Follow-up    HPI [name] presents for medical follow-up and requesting several medications be refilled today.  She had recent cataract surgery.  Her right cataract surgery went well.  She had left cataract surgery but has had some eye irritation since then.  She had recent eye exam.  They did not see any complications from her surgery.  Her chronic problems include history of DVT remotely, migraine headaches, GERD, COPD, recurrent aphthous ulcers, trigeminal neuralgia, fibromyalgia, osteoarthritis, chronic bilateral leg edema, chronic back pain, hyperlipidemia.  She has been followed by multiple specialist.  She sees headache specialists with [facility] regularly.  Requesting refills of several medications including Demadex, AcipHex, K-Lor, Singulair, Mobic.  Her edema has been controlled with edema Dex.  She did not respond very well to Lasix previously.  She is due for follow-up electrolytes.  Also requesting repeat lipids.  She has had recurrent aphthous ulcers.  We tried her briefly for a while on prophylaxis with colchicine which she thinks has made some improvement.  She has used dexamethasone elixir in the past as needed for flareups. GERD symptoms are stable currently on AcipHex.  Past Medical History:  Diagnosis Date  . Adenomatous colon polyp 09/2008  . ALLERGIC RHINITIS 07/01/2008  . ANXIETY 07/01/2008  . BACK PAIN, THORACIC REGION 03/04/2010  . BRONCHITIS, CHRONIC 07/01/2008  . CARPAL TUNNEL SYNDROME, HX OF 07/01/2008  . CHRONIC OBSTRUCTIVE PULMONARY DISEASE, ACUTE EXACERBATION 08/07/2010  . DEPRESSION 07/01/2008  . DVT, HX OF 07/01/2008  . ECZEMA 05/29/2009  . FACIAL PAIN 12/25/2009  . FIBROMYALGIA 07/01/2008  . GERD 07/01/2008  . Hiatal hernia   . Hyperlipidemia   . LEG PAIN,  BILATERAL 07/01/2008  . MIGRAINES, HX OF 07/01/2008  . OCCIPITAL NEURALGIA 07/01/2008  . OSTEOARTHRITIS 07/01/2008  . Other specified trigeminal nerve disorders 07/01/2008  . REFLEX SYMPATHETIC DYSTROPHY 07/01/2008  . Trigeminal neuralgia 05/21/2009  . Unspecified vitamin D deficiency 03/31/2010    Past Surgical History:  Procedure Laterality Date  . CARPAL TUNNEL RELEASE  2009   RIGHT  . HEAD CRANIECTOMY  1994   MICROVASULAR DECOMPRESSION  . LAVH     BSO  . LEFT KNEE SURGERY  1989  . OCCIPITAL NERVE STIMULATOR INSERTION    . RIGHT FOOT BONE SPUR  1987  . RIGHT FOOT SURGERY  1998   BAD CUT  . RIGHT JAW SURGERY  2002  . RIGHT KNEE SURGERY  1990  . [place]  . RIGHT THUMB SUGERY  2004  . TONSILLECTOMY  1957    Family History  Problem Relation Age of Onset  . Ovarian cancer Mother 65  . Hypertension Father   . Prostate cancer Father   . Heart attack Father   . Heart attack Maternal Grandmother   . Stroke Paternal Grandmother     Social History   Socioeconomic History  . Marital status: Married    Spouse name: [name]  . Number of children: 0  . Years of education: college-3  . Highest education level: Not on file  Occupational History  . Occupation: disabled  Tobacco Use  . Smoking status: Former Smoker    Packs/day: 2.00    Years: 30.00    Pack years: 60.00    Types: Cigarettes  Start date: 9    Quit date: 02/04/2012    Years since quitting: 8.4  . Smokeless tobacco: Never Used  . Tobacco comment: nicotine in vapor and is very low nicotine   Vaping Use  . Vaping Use: Every day  Substance and Sexual Activity  . Alcohol use: No    Alcohol/week: 0.0 standard drinks    Comment: quit 2000  . Drug use: No  . Sexual activity: Never  Other Topics Concern  . Not on file  Social History Narrative   Patient lives at home with husband Loraine Leriche.    Patient has no children and 2 step children.    Patient is on disability since 39  for arthalgia.    Patient has 3 years of college.    Patient is right handed.    Social Determinants of Health   Financial Resource Strain: Low Risk   . Difficulty of Paying Living Expenses: Not hard at all  Food Insecurity: No Food Insecurity  . Worried About Programme researcher, broadcasting/film/video in the Last Year: Never true  . Ran Out of Food in the Last Year: Never true  Transportation Needs: No Transportation Needs  . Lack of Transportation (Medical): No  . Lack of Transportation (Non-Medical): No  Physical Activity: Inactive  . Days of Exercise per Week: 0 days  . Minutes of Exercise per Session: 0 min  Stress: No Stress Concern Present  . Feeling of Stress : Not at all  Social Connections: Moderately Isolated  . Frequency of Communication with Friends and Family: More than three times a week  . Frequency of Social Gatherings with Friends and Family: Once a week  . Attends Religious Services: Never  . Active Member of Clubs or Organizations: No  . Attends Banker Meetings: Never  . Marital Status: Married  Catering manager Violence: Not At Risk  . Fear of Current or Ex-Partner: No  . Emotionally Abused: No  . Physically Abused: No  . Sexually Abused: No    Outpatient Medications Prior to Visit  Medication Sig Dispense Refill  . ALPRAZolam (XANAX) 0.5 MG tablet Take 1 tablet (0.5 mg total) by mouth 2 (two) times daily. 180 tablet 0  . ascorbic acid (VITAMIN C) 1000 MG tablet Take 1,000 mg by mouth daily.    Marland Kitchen aspirin 325 MG tablet daily.    . calcium carbonate (OS-CAL - DOSED IN MG OF ELEMENTAL CALCIUM) 1250 MG tablet Take 1 tablet by mouth daily.    . carboxymethylcellulose (REFRESH PLUS) 0.5 % SOLN Take 1 drop by mouth daily as needed. For dry eyes    . cetirizine (ZYRTEC) 10 MG tablet Take 10 mg by mouth daily.    . cycloSPORINE (RESTASIS) 0.05 % ophthalmic emulsion Place 1 drop into both eyes 2 (two) times daily.    Marland Kitchen dronabinol (MARINOL) 5 MG capsule dronabinol 5 mg  capsule  TAKE ONE CAPSULE BY MOUTH TWICE A DAY    . fluticasone (FLONASE) 50 MCG/ACT nasal spray Place 2 sprays into both nostrils daily as needed. For nasal congestion 16 g 3  . frovatriptan (FROVA) 2.5 MG tablet Take 2.5 mg by mouth as needed. If recurs, may repeat after 2 hours. Max of 3 tabs in 24 hours. For migraines.    Marland Kitchen Galcanezumab-gnlm (EMGALITY) 120 MG/ML SOAJ Inject into the skin.    . Galcanezumab-gnlm 120 MG/ML SOSY Inject into the skin.    Marland Kitchen ketorolac (ACULAR) 0.4 % SOLN Place 1 drop into the left  eye 4 (four) times daily.    Marland Kitchen lidocaine (LIDODERM) 5 % Place 1 patch onto the skin daily. Remove & Discard patch within 12 hours or as directed by MD 30 patch 1  . meclizine (ANTIVERT) 25 MG tablet TAKE 1 TABLET BY MOUTH 4 TIMES A DAY AS NEEDED FOR DIZZINESS 120 tablet 1  . metaxalone (SKELAXIN) 800 MG tablet Take 800 mg by mouth 3 (three) times daily.    . Multiple Vitamin (MULTIVITAMIN) tablet Take 1 tablet by mouth every morning.    . Omega-3 Fatty Acids (FISH OIL) 1200 MG CAPS Take 1 capsule by mouth every evening.    . OPTIVE 0.5-0.9 % ophthalmic solution SMARTSIG:1 Drop(s) In Eye(s) PRN    . Rimegepant Sulfate (NURTEC) 75 MG TBDP Take by mouth.    . silodosin (RAPAFLO) 8 MG CAPS capsule Take 1 capsule (8 mg total) by mouth daily with breakfast. 90 capsule 0  . tiZANidine (ZANAFLEX) 2 MG tablet Take 2 mg by mouth at bedtime.    . vitamin E 400 UNIT capsule Take 400 Units by mouth daily at 6 PM.    . albuterol (VENTOLIN HFA) 108 (90 Base) MCG/ACT inhaler Inhale 2 puffs into the lungs every 6 (six) hours as needed for wheezing or shortness of breath. 18 g 3  . colchicine 0.6 MG tablet Take 1 tablet (0.6 mg total) by mouth daily. (Patient not taking: Reported on 07/14/2020) 90 tablet 3  . dexamethasone 0.5 MG/5ML elixir Take two teaspoons and swish, gargle, and spit qid prn canker sores. (Patient not taking: Reported on 07/14/2020) 237 mL 2  . fluticasone furoate-vilanterol (BREO  ELLIPTA) 100-25 MCG/INH AEPB Inhale 1 puff into the lungs daily. 3 each 3  . hydrOXYzine (ATARAX) 10 MG tablet Take 10 mg by mouth every 8 (eight) hours as needed. For dizziness per Renaldo Reel, MD (Patient not taking: Reported on 07/14/2020)    . meloxicam (MOBIC) 15 MG tablet Take 1 tablet (15 mg total) by mouth daily. 90 tablet 3  . metoclopramide (REGLAN) 10 MG tablet Take 10 mg by mouth 3 (three) times daily as needed. For nausea (Patient not taking: Reported on 07/14/2020)    . montelukast (SINGULAIR) 10 MG tablet TAKE 1 TABLET (10 MG TOTAL) BY MOUTH DAILY AT 6 PM. 90 tablet 3  . potassium chloride (KLOR-CON) 10 MEQ tablet Take two tablets once daily 90 tablet 3  . promethazine (PHENERGAN) 25 MG tablet SMARTSIG:0.5-2 Tablet(s) By Mouth 3 Times Daily PRN (Patient not taking: Reported on 07/14/2020)    . RABEprazole (ACIPHEX) 20 MG tablet Take 1 tablet (20 mg total) by mouth daily. 90 tablet 3  . torsemide (DEMADEX) 10 MG tablet Take 1 tablet (10 mg total) by mouth daily. 90 tablet 3  . triamcinolone (KENALOG) 0.1 % paste Use as directed 1 application in the mouth or throat 2 (two) times daily. (Patient not taking: Reported on 07/14/2020) 180 g 3  . valACYclovir (VALTREX) 1000 MG tablet Take 1 tablet (1,000 mg total) by mouth 3 (three) times daily. (Patient not taking: Reported on 07/14/2020) 21 tablet 0   No facility-administered medications prior to visit.    Allergies  Allergen Reactions  . Cefazolin Anaphylaxis, Swelling and Other (See Comments)    Ancef - in surgery, swelled up, turned blue, heart problems Turns Blue  . Baclofen Other (See Comments)    REACTION: confusion REACTION: confusion REACTION: confusion REACTION: confusion  . Carbamazepine Other (See Comments)    REACTION: confusion Other reaction(s):  Confusion Confusion,Bad reactions to all seizure medicine  . Duloxetine Other (See Comments)  . Fentanyl Nausea And Vomiting and Other (See Comments)    REACTION: nausea  and vomiting  . Gabapentin Other (See Comments)    REACTION: confusion  . Naproxen Other (See Comments)    Elevates blood pressure  . Oxcarbazepine Other (See Comments)    REACTION: confusion Other reaction(s): Other REACTION: confusion REACTION: confusion REACTION: confusion   . Pregabalin Other (See Comments)    Other reaction(s): Other  . Serotonin Reuptake Inhibitors (Ssris) Other (See Comments)  . Topiramate Other (See Comments)    Other reaction(s): Unknown  . Venlafaxine Other (See Comments)    Other reaction(s): Unknown  . Zonisamide Other (See Comments)    Other reaction(s): Other  . Ceclor [Cefaclor]     hives  . Milnacipran Other (See Comments)  . Ondansetron Nausea And Vomiting    Nausea and vomiting after IV administration    ROS Review of Systems  Constitutional: Negative for chills and fever.  Respiratory: Negative for cough.   Cardiovascular: Negative for chest pain, palpitations and leg swelling.  Gastrointestinal: Negative for abdominal pain.      Objective:    Physical Exam Vitals reviewed.  Constitutional:      Appearance: Normal appearance.  HENT:     Mouth/Throat:     Comments: She does have a couple of small approximately 3 mm superficial aphthous ulcers including on the inner surface of the lower lip and left side of tongue.  These have no atypical features.  Oropharynx otherwise clear Cardiovascular:     Rate and Rhythm: Normal rate and regular rhythm.  Pulmonary:     Effort: Pulmonary effort is normal.     Breath sounds: Normal breath sounds.  Musculoskeletal:     Cervical back: Neck supple.  Lymphadenopathy:     Cervical: No cervical adenopathy.  Neurological:     Mental Status: She is alert.     BP 130/84   Pulse 75   Ht 5\' 3"  (1.6 m)   Wt 234 lb (106.1 kg)   LMP 07/26/1992   SpO2 98%   BMI 41.45 kg/m  Wt Readings from Last 3 Encounters:  07/21/20 234 lb (106.1 kg)  10/17/19 244 lb 9.6 oz (110.9 kg)  10/04/19 243 lb  (110.2 kg)     Health Maintenance Due  Topic Date Due  . Hepatitis C Screening  Never done  . TETANUS/TDAP  03/31/2020    There are no preventive care reminders to display for this patient.  Lab Results  Component Value Date   TSH 2.33 10/18/2016   Lab Results  Component Value Date   WBC 7.7 05/06/2019   HGB 13.4 05/06/2019   HCT 41.9 05/06/2019   MCV 97.0 05/06/2019   PLT 320 05/06/2019   Lab Results  Component Value Date   NA 137 05/06/2019   K 3.9 05/06/2019   CO2 19 (L) 05/06/2019   GLUCOSE 122 (H) 05/06/2019   BUN 25 (H) 05/06/2019   CREATININE 1.27 (H) 05/06/2019   BILITOT 0.3 10/01/2015   ALKPHOS 91 10/01/2015   AST 33 10/01/2015   ALT 38 (H) 10/01/2015   PROT 6.9 10/01/2015   ALBUMIN 4.3 10/01/2015   CALCIUM 9.0 05/06/2019   ANIONGAP 12 05/06/2019   GFR 60.40 09/28/2017   Lab Results  Component Value Date   CHOL 208 (H) 09/24/2019   Lab Results  Component Value Date   HDL 62 09/24/2019   Lab  Results  Component Value Date   LDLCALC 124 (H) 09/24/2019   Lab Results  Component Value Date   TRIG 127 09/24/2019   Lab Results  Component Value Date   CHOLHDL 3.4 09/24/2019   No results found for: HGBA1C    Assessment & Plan:   #1 history of GERD stable on AcipHex -Refill AcipHex for 1 year  #2 history of bilateral leg edema currently stable  -Refill Demadex and K-Lor -Check basic metabolic panel  #3 history of dyslipidemia. -Recheck fasting lipid and hepatic panel  #4 history of chronic aphthous ulcers -Continue dexamethasone elixir as needed for flareups.  She has taken Colcrys in the past for prevention  #5 COPD stable  -Patient requesting refills of her Breo inhaler as well as albuterol which she uses as needed   Meds ordered this encounter  Medications  . meloxicam (MOBIC) 15 MG tablet    Sig: Take 1 tablet (15 mg total) by mouth daily.    Dispense:  90 tablet    Refill:  3  . montelukast (SINGULAIR) 10 MG tablet     Sig: TAKE 1 TABLET (10 MG TOTAL) BY MOUTH DAILY AT 6 PM.    Dispense:  90 tablet    Refill:  3  . potassium chloride (KLOR-CON) 10 MEQ tablet    Sig: Take two tablets once daily    Dispense:  90 tablet    Refill:  3  . RABEprazole (ACIPHEX) 20 MG tablet    Sig: Take 1 tablet (20 mg total) by mouth daily.    Dispense:  90 tablet    Refill:  3  . torsemide (DEMADEX) 10 MG tablet    Sig: Take 1 tablet (10 mg total) by mouth daily.    Dispense:  90 tablet    Refill:  3  . fluticasone furoate-vilanterol (BREO ELLIPTA) 100-25 MCG/INH AEPB    Sig: Inhale 1 puff into the lungs daily.    Dispense:  3 each    Refill:  3    Please provide 90 day supply at a time.    Order Specific Question:   Lot Number?    Answer:   [id number]    Order Specific Question:   Expiration Date?    Answer:   04/24/2020    Order Specific Question:   Manufacturer?    Answer:   [employer] [[id number]]    Order Specific Question:   Quantity    Answer:   1  . albuterol (VENTOLIN HFA) 108 (90 Base) MCG/ACT inhaler    Sig: Inhale 2 puffs into the lungs every 6 (six) hours as needed for wheezing or shortness of breath.    Dispense:  18 g    Refill:  3    Follow-up: Return in about 6 months (around 01/19/2021).    Carolann Littler, MD

## 2020-07-22 ENCOUNTER — Telehealth: Payer: Self-pay

## 2020-07-22 MED ORDER — SILODOSIN 8 MG PO CAPS: 8.0000 mg | ORAL_CAPSULE | Freq: Every day | ORAL | 0 refills | Status: DC

## 2020-07-22 NOTE — Telephone Encounter (Signed)
Patient is worried about taking statins due to the side effects.  Her cardiologist had originally wanted her to take lipitor so she has a script for that.  Patient would like to know which has the least amount of side effects or is there other options.

## 2020-07-22 NOTE — Telephone Encounter (Signed)
-----   Message from Kristian Covey, MD sent at 07/22/2020  1:03 PM EST ----- Liver enzymes normal.  Kidney function and electrolytes stable.  Cholesterol is very high.  Confirm if she has had any problems with statins previously.  If not, start Crestor 10 mg once daily and recheck lipids and hepatic panel in 2 months

## 2020-07-22 NOTE — Telephone Encounter (Signed)
Any of them obviously can cause side effects, but probably fewer myalgias with Crestor.

## 2020-07-23 MED ORDER — ROSUVASTATIN CALCIUM 10 MG PO TABS
10.0000 mg | ORAL_TABLET | Freq: Every day | ORAL | 3 refills | Status: DC
Start: 2020-07-23 — End: 2021-08-28

## 2020-07-23 NOTE — Telephone Encounter (Signed)
Left message informing patient of Dr Mar Daring message.  Crestor sent to local pharmacy.

## 2020-08-04 DIAGNOSIS — Z961 Presence of intraocular lens: Secondary | ICD-10-CM | POA: Diagnosis not present

## 2020-08-04 DIAGNOSIS — H0100B Unspecified blepharitis left eye, upper and lower eyelids: Secondary | ICD-10-CM | POA: Diagnosis not present

## 2020-08-04 DIAGNOSIS — H11442 Conjunctival cysts, left eye: Secondary | ICD-10-CM | POA: Diagnosis not present

## 2020-08-04 DIAGNOSIS — G500 Trigeminal neuralgia: Secondary | ICD-10-CM | POA: Diagnosis not present

## 2020-08-04 DIAGNOSIS — H04123 Dry eye syndrome of bilateral lacrimal glands: Secondary | ICD-10-CM | POA: Diagnosis not present

## 2020-08-04 DIAGNOSIS — G4451 Hemicrania continua: Secondary | ICD-10-CM | POA: Diagnosis not present

## 2020-08-04 DIAGNOSIS — H11002 Unspecified pterygium of left eye: Secondary | ICD-10-CM | POA: Diagnosis not present

## 2020-08-04 DIAGNOSIS — Z48810 Encounter for surgical aftercare following surgery on the sense organs: Secondary | ICD-10-CM | POA: Diagnosis not present

## 2020-08-04 DIAGNOSIS — H0100A Unspecified blepharitis right eye, upper and lower eyelids: Secondary | ICD-10-CM | POA: Diagnosis not present

## 2020-08-19 DIAGNOSIS — M47816 Spondylosis without myelopathy or radiculopathy, lumbar region: Secondary | ICD-10-CM | POA: Diagnosis not present

## 2020-08-19 DIAGNOSIS — M5124 Other intervertebral disc displacement, thoracic region: Secondary | ICD-10-CM | POA: Diagnosis not present

## 2020-09-02 DIAGNOSIS — M4804 Spinal stenosis, thoracic region: Secondary | ICD-10-CM | POA: Diagnosis not present

## 2020-09-02 DIAGNOSIS — M5134 Other intervertebral disc degeneration, thoracic region: Secondary | ICD-10-CM | POA: Diagnosis not present

## 2020-09-11 ENCOUNTER — Other Ambulatory Visit: Payer: Self-pay | Admitting: Family Medicine

## 2020-09-12 NOTE — Telephone Encounter (Signed)
Last refill 07/04/20 Last office visit 07/21/20 Okay to fill?

## 2020-09-15 MED ORDER — ALPRAZOLAM 0.5 MG PO TABS: 0.5000 mg | ORAL_TABLET | Freq: Two times a day (BID) | ORAL | 1 refills | Status: DC

## 2020-09-16 NOTE — Telephone Encounter (Signed)
Printed Rx faxed to [facility] at [phone].

## 2020-09-17 DIAGNOSIS — G43719 Chronic migraine without aura, intractable, without status migrainosus: Secondary | ICD-10-CM | POA: Diagnosis not present

## 2020-10-16 DIAGNOSIS — G4486 Cervicogenic headache: Secondary | ICD-10-CM | POA: Diagnosis not present

## 2020-10-16 DIAGNOSIS — G4701 Insomnia due to medical condition: Secondary | ICD-10-CM | POA: Diagnosis not present

## 2020-10-16 DIAGNOSIS — R52 Pain, unspecified: Secondary | ICD-10-CM | POA: Diagnosis not present

## 2020-10-16 DIAGNOSIS — G43119 Migraine with aura, intractable, without status migrainosus: Secondary | ICD-10-CM | POA: Diagnosis not present

## 2020-10-16 DIAGNOSIS — B0229 Other postherpetic nervous system involvement: Secondary | ICD-10-CM | POA: Diagnosis not present

## 2020-10-16 DIAGNOSIS — G8929 Other chronic pain: Secondary | ICD-10-CM | POA: Diagnosis not present

## 2020-10-16 DIAGNOSIS — G43719 Chronic migraine without aura, intractable, without status migrainosus: Secondary | ICD-10-CM | POA: Diagnosis not present

## 2020-10-16 DIAGNOSIS — G9050 Complex regional pain syndrome I, unspecified: Secondary | ICD-10-CM | POA: Diagnosis not present

## 2020-10-16 DIAGNOSIS — G509 Disorder of trigeminal nerve, unspecified: Secondary | ICD-10-CM | POA: Diagnosis not present

## 2020-10-30 DIAGNOSIS — H11442 Conjunctival cysts, left eye: Secondary | ICD-10-CM | POA: Diagnosis not present

## 2020-10-30 DIAGNOSIS — Z9842 Cataract extraction status, left eye: Secondary | ICD-10-CM | POA: Diagnosis not present

## 2020-10-30 DIAGNOSIS — H2181 Floppy iris syndrome: Secondary | ICD-10-CM | POA: Diagnosis not present

## 2020-10-30 DIAGNOSIS — Z882 Allergy status to sulfonamides status: Secondary | ICD-10-CM | POA: Diagnosis not present

## 2020-10-30 DIAGNOSIS — H01006 Unspecified blepharitis left eye, unspecified eyelid: Secondary | ICD-10-CM | POA: Diagnosis not present

## 2020-10-30 DIAGNOSIS — H0100A Unspecified blepharitis right eye, upper and lower eyelids: Secondary | ICD-10-CM | POA: Diagnosis not present

## 2020-10-30 DIAGNOSIS — H04123 Dry eye syndrome of bilateral lacrimal glands: Secondary | ICD-10-CM | POA: Diagnosis not present

## 2020-10-30 DIAGNOSIS — Z886 Allergy status to analgesic agent status: Secondary | ICD-10-CM | POA: Diagnosis not present

## 2020-10-30 DIAGNOSIS — G514 Facial myokymia: Secondary | ICD-10-CM | POA: Diagnosis not present

## 2020-10-30 DIAGNOSIS — Z885 Allergy status to narcotic agent status: Secondary | ICD-10-CM | POA: Diagnosis not present

## 2020-10-30 DIAGNOSIS — Z961 Presence of intraocular lens: Secondary | ICD-10-CM | POA: Diagnosis not present

## 2020-10-30 DIAGNOSIS — H11002 Unspecified pterygium of left eye: Secondary | ICD-10-CM | POA: Diagnosis not present

## 2020-10-30 DIAGNOSIS — Z9841 Cataract extraction status, right eye: Secondary | ICD-10-CM | POA: Diagnosis not present

## 2020-10-30 DIAGNOSIS — G500 Trigeminal neuralgia: Secondary | ICD-10-CM | POA: Diagnosis not present

## 2020-10-30 DIAGNOSIS — H01003 Unspecified blepharitis right eye, unspecified eyelid: Secondary | ICD-10-CM | POA: Diagnosis not present

## 2020-10-30 DIAGNOSIS — H0100B Unspecified blepharitis left eye, upper and lower eyelids: Secondary | ICD-10-CM | POA: Diagnosis not present

## 2020-10-30 DIAGNOSIS — G4451 Hemicrania continua: Secondary | ICD-10-CM | POA: Diagnosis not present

## 2020-10-30 DIAGNOSIS — Z48810 Encounter for surgical aftercare following surgery on the sense organs: Secondary | ICD-10-CM | POA: Diagnosis not present

## 2020-10-30 DIAGNOSIS — Z888 Allergy status to other drugs, medicaments and biological substances status: Secondary | ICD-10-CM | POA: Diagnosis not present

## 2020-10-30 DIAGNOSIS — Z881 Allergy status to other antibiotic agents status: Secondary | ICD-10-CM | POA: Diagnosis not present

## 2020-10-31 DIAGNOSIS — M47812 Spondylosis without myelopathy or radiculopathy, cervical region: Secondary | ICD-10-CM | POA: Diagnosis not present

## 2020-10-31 DIAGNOSIS — M47816 Spondylosis without myelopathy or radiculopathy, lumbar region: Secondary | ICD-10-CM | POA: Diagnosis not present

## 2020-11-15 ENCOUNTER — Encounter: Payer: Self-pay | Admitting: Family Medicine

## 2020-11-18 MED ORDER — COLCHICINE 0.6 MG PO TABS: 0.6000 mg | ORAL_TABLET | Freq: Every day | ORAL | 3 refills | Status: DC

## 2020-11-18 MED ORDER — DEXAMETHASONE 0.5 MG/5ML PO ELIX: ORAL_SOLUTION | ORAL | 2 refills | Status: DC

## 2020-12-03 DIAGNOSIS — M47812 Spondylosis without myelopathy or radiculopathy, cervical region: Secondary | ICD-10-CM | POA: Diagnosis not present

## 2020-12-16 DIAGNOSIS — G43719 Chronic migraine without aura, intractable, without status migrainosus: Secondary | ICD-10-CM | POA: Diagnosis not present

## 2020-12-18 DIAGNOSIS — Z961 Presence of intraocular lens: Secondary | ICD-10-CM | POA: Diagnosis not present

## 2020-12-18 DIAGNOSIS — H11442 Conjunctival cysts, left eye: Secondary | ICD-10-CM | POA: Diagnosis not present

## 2020-12-18 DIAGNOSIS — H11441 Conjunctival cysts, right eye: Secondary | ICD-10-CM | POA: Diagnosis not present

## 2020-12-18 DIAGNOSIS — H0289 Other specified disorders of eyelid: Secondary | ICD-10-CM | POA: Diagnosis not present

## 2020-12-18 DIAGNOSIS — Z9842 Cataract extraction status, left eye: Secondary | ICD-10-CM | POA: Diagnosis not present

## 2020-12-18 DIAGNOSIS — H11823 Conjunctivochalasis, bilateral: Secondary | ICD-10-CM | POA: Diagnosis not present

## 2020-12-18 DIAGNOSIS — H11002 Unspecified pterygium of left eye: Secondary | ICD-10-CM | POA: Diagnosis not present

## 2020-12-18 DIAGNOSIS — G500 Trigeminal neuralgia: Secondary | ICD-10-CM | POA: Diagnosis not present

## 2020-12-18 DIAGNOSIS — G514 Facial myokymia: Secondary | ICD-10-CM | POA: Diagnosis not present

## 2020-12-18 DIAGNOSIS — H04123 Dry eye syndrome of bilateral lacrimal glands: Secondary | ICD-10-CM | POA: Diagnosis not present

## 2020-12-18 DIAGNOSIS — G4451 Hemicrania continua: Secondary | ICD-10-CM | POA: Diagnosis not present

## 2020-12-18 DIAGNOSIS — Z9841 Cataract extraction status, right eye: Secondary | ICD-10-CM | POA: Diagnosis not present

## 2020-12-18 DIAGNOSIS — H0100A Unspecified blepharitis right eye, upper and lower eyelids: Secondary | ICD-10-CM | POA: Diagnosis not present

## 2020-12-18 DIAGNOSIS — H0100B Unspecified blepharitis left eye, upper and lower eyelids: Secondary | ICD-10-CM | POA: Diagnosis not present

## 2021-01-05 DIAGNOSIS — G509 Disorder of trigeminal nerve, unspecified: Secondary | ICD-10-CM | POA: Diagnosis not present

## 2021-01-05 DIAGNOSIS — G4486 Cervicogenic headache: Secondary | ICD-10-CM | POA: Diagnosis not present

## 2021-01-05 DIAGNOSIS — G43719 Chronic migraine without aura, intractable, without status migrainosus: Secondary | ICD-10-CM | POA: Diagnosis not present

## 2021-01-05 DIAGNOSIS — M62838 Other muscle spasm: Secondary | ICD-10-CM | POA: Diagnosis not present

## 2021-01-05 DIAGNOSIS — R52 Pain, unspecified: Secondary | ICD-10-CM | POA: Diagnosis not present

## 2021-01-05 DIAGNOSIS — G9050 Complex regional pain syndrome I, unspecified: Secondary | ICD-10-CM | POA: Diagnosis not present

## 2021-01-05 DIAGNOSIS — G43119 Migraine with aura, intractable, without status migrainosus: Secondary | ICD-10-CM | POA: Diagnosis not present

## 2021-01-05 DIAGNOSIS — M5481 Occipital neuralgia: Secondary | ICD-10-CM | POA: Diagnosis not present

## 2021-01-05 DIAGNOSIS — M792 Neuralgia and neuritis, unspecified: Secondary | ICD-10-CM | POA: Diagnosis not present

## 2021-01-05 DIAGNOSIS — G8929 Other chronic pain: Secondary | ICD-10-CM | POA: Diagnosis not present

## 2021-01-05 DIAGNOSIS — B0229 Other postherpetic nervous system involvement: Secondary | ICD-10-CM | POA: Diagnosis not present

## 2021-01-05 DIAGNOSIS — G4701 Insomnia due to medical condition: Secondary | ICD-10-CM | POA: Diagnosis not present

## 2021-01-19 ENCOUNTER — Telehealth (INDEPENDENT_AMBULATORY_CARE_PROVIDER_SITE_OTHER): Payer: Medicare Other | Admitting: Family Medicine

## 2021-01-19 ENCOUNTER — Other Ambulatory Visit: Payer: Self-pay

## 2021-01-19 DIAGNOSIS — J301 Allergic rhinitis due to pollen: Secondary | ICD-10-CM

## 2021-01-19 DIAGNOSIS — J42 Unspecified chronic bronchitis: Secondary | ICD-10-CM

## 2021-01-19 DIAGNOSIS — F411 Generalized anxiety disorder: Secondary | ICD-10-CM | POA: Diagnosis not present

## 2021-01-19 DIAGNOSIS — R600 Localized edema: Secondary | ICD-10-CM

## 2021-01-19 DIAGNOSIS — E7800 Pure hypercholesterolemia, unspecified: Secondary | ICD-10-CM | POA: Diagnosis not present

## 2021-01-19 MED ORDER — FLUTICASONE PROPIONATE 50 MCG/ACT NA SUSP: 2.0000 | Freq: Every day | NASAL | 3 refills | Status: DC | PRN

## 2021-01-19 MED ORDER — ALBUTEROL SULFATE HFA 108 (90 BASE) MCG/ACT IN AERS: 1.0000 | INHALATION_SPRAY | Freq: Four times a day (QID) | RESPIRATORY_TRACT | 1 refills | Status: DC | PRN

## 2021-01-19 MED ORDER — POTASSIUM CHLORIDE ER 10 MEQ PO TBCR: EXTENDED_RELEASE_TABLET | ORAL | 3 refills | Status: DC

## 2021-01-19 MED ORDER — ALPRAZOLAM 0.5 MG PO TABS: 0.5000 mg | ORAL_TABLET | Freq: Two times a day (BID) | ORAL | 1 refills | Status: DC

## 2021-01-19 NOTE — Progress Notes (Signed)
Patient ID: Ashley Castaneda, female   DOB: 06/25/1951, 70 y.o.   MRN: [id number]  This visit type was conducted due to national recommendations for restrictions regarding the COVID-19 pandemic in an effort to limit this patient's exposure and mitigate transmission in our community.   Virtual Visit via Video Note  I connected with Ashley Castaneda on 01/19/21 at  1:30 PM EDT by a video enabled telemedicine application and verified that I am speaking with the correct person using two identifiers.  Location patient: home Location provider:work or home office Persons participating in the virtual visit: patient, provider  I discussed the limitations of evaluation and management by telemedicine and the availability of in person appointments. The patient expressed understanding and agreed to proceed.   HPI:   Bennette called requesting refills of several medications.  She specifically needs refills of Flonase, potassium, alprazolam, and albuterol.  She infrequently uses her albuterol.  She has complicated headache syndrome and has been followed by multiple neurologists.  She recently was started on Vyepti which seems to be helping.  Her husband has ALS and she stays very busy caring for him.  She had increased anxiety symptoms related to this.  She has chronic insomnia and even with alprazolam has difficulty sleeping.  She has tried melatonin without much benefit in the past.  Last labs were 12/21 and reviewed.    ROS: See pertinent positives and negatives per HPI.  Past Medical History:  Diagnosis Date   Adenomatous colon polyp 09/2008   ALLERGIC RHINITIS 07/01/2008   ANXIETY 07/01/2008   BACK PAIN, THORACIC REGION 03/04/2010   BRONCHITIS, CHRONIC 07/01/2008   CARPAL TUNNEL SYNDROME, HX OF 07/01/2008   CHRONIC OBSTRUCTIVE PULMONARY DISEASE, ACUTE EXACERBATION 08/07/2010   DEPRESSION 07/01/2008   DVT, HX OF 07/01/2008   ECZEMA 05/29/2009   FACIAL PAIN 12/25/2009   FIBROMYALGIA 07/01/2008   GERD 07/01/2008    Hiatal hernia    Hyperlipidemia    LEG PAIN, BILATERAL 07/01/2008   MIGRAINES, HX OF 07/01/2008   OCCIPITAL NEURALGIA 07/01/2008   OSTEOARTHRITIS 07/01/2008   Other specified trigeminal nerve disorders 07/01/2008   REFLEX SYMPATHETIC DYSTROPHY 07/01/2008   Trigeminal neuralgia 05/21/2009   Unspecified vitamin D deficiency 03/31/2010    Past Surgical History:  Procedure Laterality Date   CARPAL TUNNEL RELEASE  2009   RIGHT   HEAD CRANIECTOMY  1994   MICROVASULAR DECOMPRESSION   [facility]     BSO   LEFT KNEE SURGERY  1989   OCCIPITAL NERVE STIMULATOR INSERTION     RIGHT FOOT BONE SPUR  1987   RIGHT FOOT SURGERY  1998   BAD CUT   RIGHT JAW SURGERY  2002   [place]   RIGHT [place]   RIGHT THUMB SUGERY  2004   TONSILLECTOMY  1957    Family History  Problem Relation Age of Onset   Ovarian cancer Mother 36   Hypertension Father    Prostate cancer Father    Heart attack Father    Heart attack Maternal Grandmother    Stroke Paternal Grandmother     SOCIAL HX: Non-smoker   Current Outpatient Medications:    albuterol (PROAIR HFA) 108 (90 Base) MCG/ACT inhaler, Inhale 1-2 puffs into the lungs every 6 (six) hours as needed for wheezing or shortness of breath., Disp: 8 g, Rfl: 1   ascorbic acid (VITAMIN C) 1000 MG tablet, Take 1,000 mg by mouth daily., Disp: , Rfl:  aspirin 325 MG tablet, daily., Disp: , Rfl:    calcium carbonate (OS-CAL - DOSED IN MG OF ELEMENTAL CALCIUM) 1250 MG tablet, Take 1 tablet by mouth daily., Disp: , Rfl:    carboxymethylcellulose (REFRESH PLUS) 0.5 % SOLN, Take 1 drop by mouth daily as needed. For dry eyes, Disp: , Rfl:    cetirizine (ZYRTEC) 10 MG tablet, Take 10 mg by mouth daily., Disp: , Rfl:    colchicine 0.6 MG tablet, Take 1 tablet (0.6 mg total) by mouth daily., Disp: 90 tablet, Rfl: 3   cycloSPORINE (RESTASIS) 0.05 % ophthalmic emulsion, Place 1 drop into both eyes 2 (two) times daily., Disp: , Rfl:     dexamethasone 0.5 MG/5ML elixir, Take two teaspoons and swish, gargle, and spit qid prn canker sores., Disp: 237 mL, Rfl: 2   dronabinol (MARINOL) 5 MG capsule, dronabinol 5 mg capsule  TAKE ONE CAPSULE BY MOUTH TWICE A DAY, Disp: , Rfl:    Eptinezumab-jjmr (VYEPTI IV), Inject into the vein., Disp: , Rfl:    fluticasone furoate-vilanterol (BREO ELLIPTA) 100-25 MCG/INH AEPB, Inhale 1 puff into the lungs daily., Disp: 3 each, Rfl: 3   frovatriptan (FROVA) 2.5 MG tablet, Take 2.5 mg by mouth as needed. If recurs, may repeat after 2 hours. Max of 3 tabs in 24 hours. For migraines., Disp: , Rfl:    Galcanezumab-gnlm 120 MG/ML SOSY, Inject into the skin., Disp: , Rfl:    ketorolac (ACULAR) 0.4 % SOLN, Place 1 drop into the left eye 4 (four) times daily., Disp: , Rfl:    lidocaine (LIDODERM) 5 %, Place 1 patch onto the skin daily. Remove & Discard patch within 12 hours or as directed by MD, Disp: 30 patch, Rfl: 1   meclizine (ANTIVERT) 25 MG tablet, TAKE 1 TABLET BY MOUTH 4 TIMES A DAY AS NEEDED FOR DIZZINESS, Disp: 120 tablet, Rfl: 1   meloxicam (MOBIC) 15 MG tablet, Take 1 tablet (15 mg total) by mouth daily., Disp: 90 tablet, Rfl: 3   metaxalone (SKELAXIN) 800 MG tablet, Take 800 mg by mouth 3 (three) times daily., Disp: , Rfl:    montelukast (SINGULAIR) 10 MG tablet, TAKE 1 TABLET (10 MG TOTAL) BY MOUTH DAILY AT 6 PM., Disp: 90 tablet, Rfl: 3   Multiple Vitamin (MULTIVITAMIN) tablet, Take 1 tablet by mouth every morning., Disp: , Rfl:    Omega-3 Fatty Acids (FISH OIL) 1200 MG CAPS, Take 1 capsule by mouth every evening., Disp: , Rfl:    OPTIVE 0.5-0.9 % ophthalmic solution, SMARTSIG:1 Drop(s) In Eye(s) PRN, Disp: , Rfl:    RABEprazole (ACIPHEX) 20 MG tablet, Take 1 tablet (20 mg total) by mouth daily., Disp: 90 tablet, Rfl: 3   Rimegepant Sulfate (NURTEC) 75 MG TBDP, Take by mouth., Disp: , Rfl:    rosuvastatin (CRESTOR) 10 MG tablet, Take 1 tablet (10 mg total) by mouth daily., Disp: 90 tablet, Rfl:  3   silodosin (RAPAFLO) 8 MG CAPS capsule, Take 1 capsule (8 mg total) by mouth daily with breakfast., Disp: 90 capsule, Rfl: 0   tiZANidine (ZANAFLEX) 2 MG tablet, Take 2 mg by mouth at bedtime., Disp: , Rfl:    torsemide (DEMADEX) 10 MG tablet, Take 1 tablet (10 mg total) by mouth daily., Disp: 90 tablet, Rfl: 3   vitamin E 400 UNIT capsule, Take 400 Units by mouth daily at 6 PM., Disp: , Rfl:    ALPRAZolam (XANAX) 0.5 MG tablet, Take 1 tablet (0.5 mg total) by mouth 2 (two)  times daily., Disp: 180 tablet, Rfl: 1   fluticasone (FLONASE) 50 MCG/ACT nasal spray, Place 2 sprays into both nostrils daily as needed. For nasal congestion, Disp: 16 g, Rfl: 3   potassium chloride (KLOR-CON) 10 MEQ tablet, Take two tablets once daily, Disp: 90 tablet, Rfl: 3  EXAM:  VITALS per patient if applicable:  GENERAL: alert, oriented, appears well and in no acute distress  HEENT: atraumatic, conjunttiva clear, no obvious abnormalities on inspection of external nose and ears  NECK: normal movements of the head and neck  LUNGS: on inspection no signs of respiratory distress, breathing rate appears normal, no obvious gross SOB, gasping or wheezing  CV: no obvious cyanosis  MS: moves all visible extremities without noticeable abnormality  PSYCH/NEURO: pleasant and cooperative, no obvious depression or anxiety, speech and thought processing grossly intact  ASSESSMENT AND PLAN:  Discussed the following assessment and plan:  #1 chronic anxiety.  We have tried SSRIs in the past and she has not done well with those.  She had questions regarding whether we could titrate her Xanax.  We have discouraged this because of increased risk of falls and possible concerns for memory loss.  #2 intermittent wheezing. -Refilled albuterol for as needed use  #3 history of chronic bilateral leg edema.  Patient on Demadex.  Refilled her potassium for 6 months.  Recommend basic metabolic panel at next office follow-up  #4  hyperlipidemia.  We had recommended Crestor but patient never went on treatment.  If she does go back on the Crestor I would recommend a follow-up lipid and hepatic panel in 2 months     I discussed the assessment and treatment plan with the patient. The patient was provided an opportunity to ask questions and all were answered. The patient agreed with the plan and demonstrated an understanding of the instructions.   The patient was advised to call back or seek an in-person evaluation if the symptoms worsen or if the condition fails to improve as anticipated.     Carolann Littler, MD

## 2021-02-05 ENCOUNTER — Telehealth (INDEPENDENT_AMBULATORY_CARE_PROVIDER_SITE_OTHER): Payer: Self-pay | Admitting: Otolaryngology

## 2021-02-05 DIAGNOSIS — G500 Trigeminal neuralgia: Secondary | ICD-10-CM | POA: Diagnosis not present

## 2021-02-05 DIAGNOSIS — G4451 Hemicrania continua: Secondary | ICD-10-CM | POA: Diagnosis not present

## 2021-02-05 DIAGNOSIS — H0100A Unspecified blepharitis right eye, upper and lower eyelids: Secondary | ICD-10-CM | POA: Diagnosis not present

## 2021-02-05 DIAGNOSIS — H0289 Other specified disorders of eyelid: Secondary | ICD-10-CM | POA: Diagnosis not present

## 2021-02-05 DIAGNOSIS — H11002 Unspecified pterygium of left eye: Secondary | ICD-10-CM | POA: Diagnosis not present

## 2021-02-05 DIAGNOSIS — H11823 Conjunctivochalasis, bilateral: Secondary | ICD-10-CM | POA: Diagnosis not present

## 2021-02-05 DIAGNOSIS — Z961 Presence of intraocular lens: Secondary | ICD-10-CM | POA: Diagnosis not present

## 2021-02-05 DIAGNOSIS — H04123 Dry eye syndrome of bilateral lacrimal glands: Secondary | ICD-10-CM | POA: Diagnosis not present

## 2021-02-05 DIAGNOSIS — H0100B Unspecified blepharitis left eye, upper and lower eyelids: Secondary | ICD-10-CM | POA: Diagnosis not present

## 2021-02-05 DIAGNOSIS — G514 Facial myokymia: Secondary | ICD-10-CM | POA: Diagnosis not present

## 2021-02-05 DIAGNOSIS — H11442 Conjunctival cysts, left eye: Secondary | ICD-10-CM | POA: Diagnosis not present

## 2021-02-05 NOTE — Telephone Encounter (Signed)
Ashley Castaneda called the office about being chronically dizzy.  She is also been having headaches.  She has a complex history of neurologic problems.  She sees a neurologist in [place] as well as has seen a neuromuscular doctor at [facility].  She has longstanding hearing loss in 1 year.  She underwent VNG testing with [place] about a year ago that showed significant decreased vestibular weakness on the left side where she has chronic hearing loss.  I suggested vestibular rehab to help some with her balance although I am not sure this will help that much with her complaints of chronic dizziness. On her own research she inquired about a possible injection in the ear and I discussed with her that I do not feel like this would significantly help her although she could explore this with ENT at [facility] or [place]. We will go ahead and arrange an appointment for vestibular rehab for her. Apparently her neurologist recommended following up with [place] concerning treatment of her complaints of chronic dizziness.

## 2021-02-12 DIAGNOSIS — H11823 Conjunctivochalasis, bilateral: Secondary | ICD-10-CM | POA: Diagnosis not present

## 2021-02-12 DIAGNOSIS — H11002 Unspecified pterygium of left eye: Secondary | ICD-10-CM | POA: Diagnosis not present

## 2021-02-12 DIAGNOSIS — G500 Trigeminal neuralgia: Secondary | ICD-10-CM | POA: Diagnosis not present

## 2021-02-12 DIAGNOSIS — G514 Facial myokymia: Secondary | ICD-10-CM | POA: Diagnosis not present

## 2021-02-12 DIAGNOSIS — H11442 Conjunctival cysts, left eye: Secondary | ICD-10-CM | POA: Diagnosis not present

## 2021-02-12 DIAGNOSIS — H0100A Unspecified blepharitis right eye, upper and lower eyelids: Secondary | ICD-10-CM | POA: Diagnosis not present

## 2021-02-12 DIAGNOSIS — H04123 Dry eye syndrome of bilateral lacrimal glands: Secondary | ICD-10-CM | POA: Diagnosis not present

## 2021-02-12 DIAGNOSIS — H0100B Unspecified blepharitis left eye, upper and lower eyelids: Secondary | ICD-10-CM | POA: Diagnosis not present

## 2021-02-12 DIAGNOSIS — Z961 Presence of intraocular lens: Secondary | ICD-10-CM | POA: Diagnosis not present

## 2021-02-24 ENCOUNTER — Ambulatory Visit
Admission: RE | Admit: 2021-02-24 | Discharge: 2021-02-24 | Disposition: A | Discharge status: Home / Self Care | Payer: Medicare Other | Source: Ambulatory Visit

## 2021-02-24 ENCOUNTER — Ambulatory Visit (INDEPENDENT_AMBULATORY_CARE_PROVIDER_SITE_OTHER): Payer: Medicare Other

## 2021-02-24 ENCOUNTER — Other Ambulatory Visit: Payer: Self-pay

## 2021-02-24 VITALS — BP 103/70 | HR 85 | Temp 98.4°F | Ht 63.0 in | Wt 240.0 lb

## 2021-02-24 DIAGNOSIS — R079 Chest pain, unspecified: Secondary | ICD-10-CM

## 2021-02-24 DIAGNOSIS — H66002 Acute suppurative otitis media without spontaneous rupture of ear drum, left ear: Secondary | ICD-10-CM

## 2021-02-24 DIAGNOSIS — K219 Gastro-esophageal reflux disease without esophagitis: Secondary | ICD-10-CM | POA: Diagnosis not present

## 2021-02-24 MED ORDER — SUCRALFATE 1 GM/10ML PO SUSP: 1.0000 g | Freq: Three times a day (TID) | ORAL | 0 refills | Status: DC

## 2021-02-24 MED ORDER — AZITHROMYCIN 250 MG PO TABS: ORAL_TABLET | ORAL | 0 refills | Status: DC

## 2021-02-24 NOTE — ED Provider Notes (Signed)
[facility]    CSN: [id number] Arrival date & time: 02/24/21  1441      History   Chief Complaint Chief Complaint  Patient presents with   Other    Pt states that she has a pressure in the middle of her chest. X4 days    HPI Ashley Castaneda is a 70 y.o. female.   HPI Patient presents today for evaluation of pressure in the middle of her chest following choking on a sandwich 4 days ago.  Patient reports that she aspirated while eating a sandwich and her husband was able to assist her and clearing obstruction.  Since this occurred she has had some discomfort with swallowing and sensation of pressure in her mid upper bronchus area.  She reports sensation has been burning and pressure.  She reports a distant history of GERD Past Medical History:  Diagnosis Date   Adenomatous colon polyp 09/2008   ALLERGIC RHINITIS 07/01/2008   ANXIETY 07/01/2008   BACK PAIN, THORACIC REGION 03/04/2010   BRONCHITIS, CHRONIC 07/01/2008   CARPAL TUNNEL SYNDROME, HX OF 07/01/2008   CHRONIC OBSTRUCTIVE PULMONARY DISEASE, ACUTE EXACERBATION 08/07/2010   DEPRESSION 07/01/2008   DVT, HX OF 07/01/2008   ECZEMA 05/29/2009   FACIAL PAIN 12/25/2009   FIBROMYALGIA 07/01/2008   GERD 07/01/2008   Hiatal hernia    Hyperlipidemia    LEG PAIN, BILATERAL 07/01/2008   MIGRAINES, HX OF 07/01/2008   OCCIPITAL NEURALGIA 07/01/2008   OSTEOARTHRITIS 07/01/2008   Other specified trigeminal nerve disorders 07/01/2008   REFLEX SYMPATHETIC DYSTROPHY 07/01/2008   Trigeminal neuralgia 05/21/2009   Unspecified vitamin D deficiency 03/31/2010    Patient Active Problem List   Diagnosis Date Noted   Encounter for Medicare annual wellness exam 07/07/2020   Status post right cataract extraction 04/10/2020   DOE (dyspnea on exertion) 10/17/2019   Recurrent aphthous ulcer 03/27/2019   Chronic bilateral low back pain without sciatica 02/09/2019   Pain in surgical scar 08/01/2018   Bilateral leg edema 09/28/2017   Infected surgical  wound 09/14/2017   Status post insertion of spinal cord stimulator 09/14/2017   Obesity 10/08/2015   Migraine headache 01/18/2014   Dizziness and giddiness 01/18/2014   Numbness and tingling of both legs 01/18/2014   Severe obesity (BMI >= 40) (HCC) 06/14/2013   Numbness and tingling in right hand 05/30/2013   Hyperlipidemia 06/06/2012   Fatigue 06/06/2012   Dvt femoral (deep venous thrombosis) (HCC) 10/28/2011   CHRONIC OBSTRUCTIVE PULMONARY DISEASE, ACUTE EXACERBATION 08/07/2010   UNSPECIFIED VITAMIN D DEFICIENCY 03/31/2010   BACK PAIN, THORACIC REGION 03/04/2010   ECZEMA 05/29/2009   TRIGEMINAL NEURALGIA 05/21/2009   Anxiety state 07/01/2008   DEPRESSION 07/01/2008   REFLEX SYMPATHETIC DYSTROPHY 07/01/2008   OTHER SPECIFIED TRIGEMINAL NERVE DISORDERS 07/01/2008   Allergic rhinitis 07/01/2008   BRONCHITIS, CHRONIC 07/01/2008   GERD 07/01/2008   OSTEOARTHRITIS 07/01/2008   OCCIPITAL NEURALGIA 07/01/2008   FIBROMYALGIA 07/01/2008   CARPAL TUNNEL SYNDROME, HX OF 07/01/2008   MIGRAINES, HX OF 07/01/2008    Past Surgical History:  Procedure Laterality Date   CARPAL TUNNEL RELEASE  07/27/2007   RIGHT   HEAD CRANIECTOMY  07/26/1992   MICROVASULAR DECOMPRESSION   [facility]     BSO   LEFT KNEE SURGERY  07/27/1987   OCCIPITAL NERVE STIMULATOR INSERTION     RIGHT FOOT BONE SPUR  07/26/1985   RIGHT FOOT SURGERY  07/26/1996   BAD CUT   RIGHT JAW SURGERY  07/26/2000   RIGHT KNEE  SURGERY  07/26/1988   RIGHT [place]   RIGHT THUMB SUGERY  07/26/2002   tear duct surgery     TONSILLECTOMY  07/27/1955    OB History     Gravida  0   Para      Term      Preterm      AB      Living         SAB      IAB      Ectopic      Multiple      Live Births               Home Medications    Prior to Admission medications   Medication Sig Start Date End Date Taking? Authorizing Provider  albuterol (PROAIR HFA) 108 (90 Base) MCG/ACT  inhaler Inhale 1-2 puffs into the lungs every 6 (six) hours as needed for wheezing or shortness of breath. 01/19/21  Yes Burchette, Alinda Sierras, MD  ALPRAZolam Duanne Moron) 0.5 MG tablet Take 1 tablet (0.5 mg total) by mouth 2 (two) times daily. 01/19/21  Yes Burchette, Alinda Sierras, MD  ascorbic acid (VITAMIN C) 1000 MG tablet Take 1,000 mg by mouth daily.   Yes [provider]  aspirin 325 MG tablet daily.   Yes [provider]  azithromycin (ZITHROMAX) 250 MG tablet Take 2 tabs PO x 1 dose, then 1 tab PO QD x 4 days 02/24/21  Yes Scot Jun, FNP  calcium carbonate (OS-CAL - DOSED IN MG OF ELEMENTAL CALCIUM) 1250 MG tablet Take 1 tablet by mouth daily.   Yes [provider]  carboxymethylcellulose (REFRESH PLUS) 0.5 % SOLN Take 1 drop by mouth daily as needed. For dry eyes   Yes [provider]  cetirizine (ZYRTEC) 10 MG tablet Take 10 mg by mouth daily.   Yes [provider]  colchicine 0.6 MG tablet Take 1 tablet (0.6 mg total) by mouth daily. 11/18/20  Yes Burchette, Alinda Sierras, MD  cycloSPORINE (RESTASIS) 0.05 % ophthalmic emulsion Place 1 drop into both eyes 2 (two) times daily.   Yes [provider]  dexamethasone 0.5 MG/5ML elixir Take two teaspoons and swish, gargle, and spit qid prn canker sores. 11/18/20  Yes Burchette, Alinda Sierras, MD  dronabinol (MARINOL) 5 MG capsule dronabinol 5 mg capsule  TAKE ONE CAPSULE BY MOUTH TWICE A DAY   Yes [provider]  Eptinezumab-jjmr (VYEPTI IV) Inject into the vein.   Yes [provider]  erythromycin ophthalmic ointment at bedtime. 02/12/21  Yes [provider]  fluticasone (FLONASE) 50 MCG/ACT nasal spray Place 2 sprays into both nostrils daily as needed. For nasal congestion 01/19/21 12/27/22 Yes Burchette, Alinda Sierras, MD  fluticasone furoate-vilanterol (BREO ELLIPTA) 100-25 MCG/INH AEPB Inhale 1 puff into the lungs daily. 07/21/20  Yes Burchette, Alinda Sierras, MD  frovatriptan (FROVA) 2.5 MG  tablet Take 2.5 mg by mouth as needed. If recurs, may repeat after 2 hours. Max of 3 tabs in 24 hours. For migraines.   Yes [provider]  Galcanezumab-gnlm 120 MG/ML SOSY Inject into the skin.   Yes [provider]  ketorolac (ACULAR) 0.4 % SOLN Place 1 drop into the left eye 4 (four) times daily. 12/11/19  Yes [provider]  lidocaine (LIDODERM) 5 % Place 1 patch onto the skin daily. Remove & Discard patch within 12 hours or as directed by MD 04/12/18  Yes Burchette, Alinda Sierras, MD  meclizine (ANTIVERT) 25 MG tablet TAKE 1 TABLET BY MOUTH 4 TIMES A DAY AS NEEDED FOR DIZZINESS 04/17/14  Yes Kathrynn Ducking, MD  meloxicam (MOBIC) 15 MG tablet Take 1 tablet (15 mg total) by mouth daily. 07/21/20  Yes Burchette, Alinda Sierras, MD  metaxalone (SKELAXIN) 800 MG tablet Take 800 mg by mouth 3 (three) times daily.   Yes [provider]  montelukast (SINGULAIR) 10 MG tablet TAKE 1 TABLET (10 MG TOTAL) BY MOUTH DAILY AT 6 PM. 07/21/20  Yes Burchette, Alinda Sierras, MD  Multiple Vitamin (MULTIVITAMIN) tablet Take 1 tablet by mouth every morning.   Yes [provider]  Omega-3 Fatty Acids (FISH OIL) 1200 MG CAPS Take 1 capsule by mouth every evening.   Yes [provider]  OPTIVE 0.5-0.9 % ophthalmic solution SMARTSIG:1 Drop(s) In Eye(s) PRN 12/10/19  Yes [provider]  potassium chloride (KLOR-CON) 10 MEQ tablet Take two tablets once daily 01/19/21  Yes Burchette, Alinda Sierras, MD  RABEprazole (ACIPHEX) 20 MG tablet Take 1 tablet (20 mg total) by mouth daily. 07/21/20  Yes Burchette, Alinda Sierras, MD  Rimegepant Sulfate (NURTEC) 75 MG TBDP Take by mouth. 10/03/19  Yes [provider]  rosuvastatin (CRESTOR) 10 MG tablet Take 1 tablet (10 mg total) by mouth daily. 07/23/20  Yes Burchette, Alinda Sierras, MD  silodosin (RAPAFLO) 8 MG CAPS capsule Take 1 capsule (8 mg total) by mouth daily with breakfast. 07/22/20  Yes Burchette, Alinda Sierras, MD  sucralfate (CARAFATE) 1  GM/10ML suspension Take 10 mLs (1 g total) by mouth 3 (three) times daily with meals. 02/24/21  Yes Scot Jun, FNP  tiZANidine (ZANAFLEX) 2 MG tablet Take 2 mg by mouth at bedtime.   Yes [provider]  torsemide (DEMADEX) 10 MG tablet Take 1 tablet (10 mg total) by mouth daily. 07/21/20  Yes Burchette, Alinda Sierras, MD  vitamin E 400 UNIT capsule Take 400 Units by mouth daily at 6 PM.   Yes [provider]    Family History Family History  Problem Relation Age of Onset   Ovarian cancer Mother 53   Hypertension Father    Prostate cancer Father    Heart attack Father    Heart attack Maternal Grandmother    Stroke Paternal Grandmother     Social History Social History   Tobacco Use   Smoking status: Former    Packs/day: 2.00    Years: 30.00    Pack years: 60.00    Types: Cigarettes    Start date: 21    Quit date: 02/04/2012    Years since quitting: 9.0   Smokeless tobacco: Never   Tobacco comments:    nicotine in vapor and is very low nicotine   Vaping Use   Vaping Use: Every day  Substance Use Topics   Alcohol use: No    Alcohol/week: 0.0 standard drinks    Comment: quit 2000   Drug use: No     Allergies   Cefazolin, Baclofen, Carbamazepine, Duloxetine, Fentanyl, Gabapentin, Naproxen, Oxcarbazepine, Pregabalin, Serotonin reuptake inhibitors (ssris), Topiramate, Venlafaxine, Zonisamide, Ceclor [cefaclor], Milnacipran, and Ondansetron   Review of Systems Review of Systems Pertinent negatives listed in HPI  Physical Exam Triage Vital Signs ED Triage Vitals  Enc Vitals Group     BP 02/24/21 1508 103/70     Pulse Rate 02/24/21 1508 85     Resp --      Temp 02/24/21 1508 98.4 F (36.9 C)     Temp  Source 02/24/21 1508 Oral     SpO2 02/24/21 1508 93 %     Weight 02/24/21 1505 240 lb (108.9 kg)     Height 02/24/21 1505 '5\' 3"'$  (1.6 m)     Head Circumference --      Peak Flow --      Pain Score 02/24/21 1505 4     Pain Loc --      Pain Edu?  --      Excl. in [place]? --    No data found.  Updated Vital Signs BP 103/70 (BP Location: Right Arm)   Pulse 85   Temp 98.4 F (36.9 C) (Oral)   Ht '5\' 3"'$  (1.6 m)   Wt 240 lb (108.9 kg)   LMP 07/26/1992   SpO2 93%   BMI 42.51 kg/m   Visual Acuity Right Eye Distance:   Left Eye Distance:   Bilateral Distance:    Right Eye Near:   Left Eye Near:    Bilateral Near:     Physical Exam General Appearance:    Alert, cooperative, no distress  HENT:   Normocephalic, ENT exam normal, no neck nodes or sinus tenderness, left TM red, dull, bulging, left TM fluid noted, and throat normal without erythema or exudate  Eyes:    PERRL, conjunctiva/corneas clear, EOM's intact       Lungs:     Clear to auscultation bilaterally, respirations unlabored  Heart:    Regular rate and rhythm  Neurologic:   Awake, alert, oriented x 3. No apparent focal neurological deficit         UC Treatments / Results  Labs (all labs ordered are listed, but only abnormal results are displayed) Labs Reviewed - No data to display  EKG   Radiology DG Chest 2 View  Result Date: 02/24/2021 CLINICAL DATA:  Pain and pressure to chest. Choked on porch out 4 days ago. Still feels as though there is something in the middle over chest. EXAM: CHEST - 2 VIEW COMPARISON:  May 06, 2019 FINDINGS: Trachea is midline. Cardiomediastinal contours and hilar structures are stable. Linear opacities at the LEFT lung base are unchanged since October of 2020. No lobar consolidation. No sign of pleural effusion. No pneumothorax. On limited assessment no acute skeletal process. IMPRESSION: Stable scarring at the LEFT lung base. No acute findings. Electronically Signed   By: Zetta Bills M.D.   On: 02/24/2021 15:50    Procedures Procedures (including critical care time)  Medications Ordered in UC Medications - No data to display  Initial Impression / Assessment and Plan / UC Course  I have reviewed the triage vital signs and the  nursing notes.  Pertinent labs & imaging results that were available during my care of the patient were reviewed by me and considered in my medical decision making (see chart for details).    GERD with esophagitis from recent aspiration treating with Carafate as needed before meals. Acute otitis media azithromycin.  Advised to follow-up with PCP if symptoms persist in spite of therapy.  ER if any difficulty breathing or swallowing develops.  Final Clinical Impressions(s) / UC Diagnoses   Final diagnoses:  Gastroesophageal reflux disease without esophagitis  Non-recurrent acute suppurative otitis media of left ear without spontaneous rupture of tympanic membrane   Discharge Instructions   None    ED Prescriptions     Medication Sig Dispense Auth. Provider   sucralfate (CARAFATE) 1 GM/10ML suspension Take 10 mLs (1 g total) by mouth 3 (three)  times daily with meals. 414 mL Scot Jun, FNP   azithromycin (ZITHROMAX) 250 MG tablet Take 2 tabs PO x 1 dose, then 1 tab PO QD x 4 days 6 tablet Scot Jun, FNP      PDMP not reviewed this encounter.   Scot Jun, FNP 02/24/21 1655

## 2021-02-24 NOTE — ED Triage Notes (Signed)
Pt states that she choked on a piece of porkchop 4 days ago. Pt states that she coughed up most of it but still feels there is something in the middle of her chest. Pt states that she feels burning in the middle of her chest as well.

## 2021-02-26 ENCOUNTER — Encounter: Payer: Self-pay | Admitting: Emergency Medicine

## 2021-02-26 ENCOUNTER — Ambulatory Visit
Admission: EM | Admit: 2021-02-26 | Discharge: 2021-02-26 | Disposition: A | Discharge status: Home / Self Care | Payer: Medicare Other | Attending: Emergency Medicine | Admitting: Emergency Medicine

## 2021-02-26 ENCOUNTER — Other Ambulatory Visit: Payer: Self-pay

## 2021-02-26 DIAGNOSIS — K219 Gastro-esophageal reflux disease without esophagitis: Secondary | ICD-10-CM | POA: Diagnosis not present

## 2021-02-26 DIAGNOSIS — R1310 Dysphagia, unspecified: Secondary | ICD-10-CM

## 2021-02-26 NOTE — ED Provider Notes (Signed)
Roderic Palau    CSN: [id number] Arrival date & time: 02/26/21  1423      History   Chief Complaint Chief Complaint  Patient presents with   Choking   Shortness of Breath     HPI Ashley Castaneda is a 70 y.o. female.  Patient presents with episode of choking while having a milkshake today.  She states she developed hoarseness after the choking episode.  She also reports chest pain and shortness of breath after the choking episode.  She denies fever, chills, abdominal pain, vomiting, diarrhea, or other symptoms.  Patient was seen at [facility] urgent care on 02/24/2021 with complaint of choking on a sandwich 4 days prior to the visit; chest x-ray showed no acute findings; she was diagnosed with GERD with esophagitis from recent aspiration and otitis media; treated with Carafate and Zithromax.  Her medical history includes COPD, migraine headaches, back pain, arthritis, depression, anxiety, fibromyalgia.  The history is provided by the patient and medical records.   Past Medical History:  Diagnosis Date   Adenomatous colon polyp 09/2008   ALLERGIC RHINITIS 07/01/2008   ANXIETY 07/01/2008   BACK PAIN, THORACIC REGION 03/04/2010   BRONCHITIS, CHRONIC 07/01/2008   CARPAL TUNNEL SYNDROME, HX OF 07/01/2008   CHRONIC OBSTRUCTIVE PULMONARY DISEASE, ACUTE EXACERBATION 08/07/2010   DEPRESSION 07/01/2008   DVT, HX OF 07/01/2008   ECZEMA 05/29/2009   FACIAL PAIN 12/25/2009   FIBROMYALGIA 07/01/2008   GERD 07/01/2008   Hiatal hernia    Hyperlipidemia    LEG PAIN, BILATERAL 07/01/2008   MIGRAINES, HX OF 07/01/2008   OCCIPITAL NEURALGIA 07/01/2008   OSTEOARTHRITIS 07/01/2008   Other specified trigeminal nerve disorders 07/01/2008   REFLEX SYMPATHETIC DYSTROPHY 07/01/2008   Trigeminal neuralgia 05/21/2009   Unspecified vitamin D deficiency 03/31/2010    Patient Active Problem List   Diagnosis Date Noted   Encounter for Medicare annual wellness exam 07/07/2020   Status post right cataract extraction  04/10/2020   DOE (dyspnea on exertion) 10/17/2019   Recurrent aphthous ulcer 03/27/2019   Chronic bilateral low back pain without sciatica 02/09/2019   Pain in surgical scar 08/01/2018   Bilateral leg edema 09/28/2017   Infected surgical wound 09/14/2017   Status post insertion of spinal cord stimulator 09/14/2017   Obesity 10/08/2015   Migraine headache 01/18/2014   Dizziness and giddiness 01/18/2014   Numbness and tingling of both legs 01/18/2014   Severe obesity (BMI >= 40) ([place]) 06/14/2013   Numbness and tingling in right hand 05/30/2013   Hyperlipidemia 06/06/2012   Fatigue 06/06/2012   Dvt femoral (deep venous thrombosis) (HCC) 10/28/2011   CHRONIC OBSTRUCTIVE PULMONARY DISEASE, ACUTE EXACERBATION 08/07/2010   UNSPECIFIED VITAMIN D DEFICIENCY 03/31/2010   BACK PAIN, THORACIC REGION 03/04/2010   ECZEMA 05/29/2009   TRIGEMINAL NEURALGIA 05/21/2009   Anxiety state 07/01/2008   DEPRESSION 07/01/2008   REFLEX SYMPATHETIC DYSTROPHY 07/01/2008   OTHER SPECIFIED TRIGEMINAL NERVE DISORDERS 07/01/2008   Allergic rhinitis 07/01/2008   BRONCHITIS, CHRONIC 07/01/2008   GERD 07/01/2008   OSTEOARTHRITIS 07/01/2008   OCCIPITAL NEURALGIA 07/01/2008   FIBROMYALGIA 07/01/2008   CARPAL TUNNEL SYNDROME, HX OF 07/01/2008   MIGRAINES, HX OF 07/01/2008    Past Surgical History:  Procedure Laterality Date   CARPAL TUNNEL RELEASE  07/27/2007   RIGHT   HEAD CRANIECTOMY  07/26/1992   MICROVASULAR DECOMPRESSION   LA[facility]     BSO   LEFT KNEE SURGERY  07/27/1987   OCCIPITAL NERVE STIMULATOR INSERTION     RIGHT  FOOT BONE SPUR  07/26/1985   RIGHT FOOT SURGERY  07/26/1996   BAD CUT   RIGHT JAW SURGERY  07/26/2000   RIGHT KNEE SURGERY  07/26/1988   RIGHT [place], 1993, 1995, 1999   RIGHT THUMB SUGERY  07/26/2002   tear duct surgery     TONSILLECTOMY  07/27/1955    OB History     Gravida  0   Para      Term      Preterm      AB      Living         SAB      IAB       Ectopic      Multiple      Live Births               Home Medications    Prior to Admission medications   Medication Sig Start Date End Date Taking? Authorizing Provider  aspirin 325 MG tablet daily.   Yes [provider]  azithromycin (ZITHROMAX) 250 MG tablet Take 2 tabs PO x 1 dose, then 1 tab PO QD x 4 days 02/24/21  Yes Scot Jun, FNP  meclizine (ANTIVERT) 25 MG tablet TAKE 1 TABLET BY MOUTH 4 TIMES A DAY AS NEEDED FOR DIZZINESS 04/17/14  Yes Kathrynn Ducking, MD  meloxicam (MOBIC) 15 MG tablet Take 1 tablet (15 mg total) by mouth daily. 07/21/20  Yes Burchette, Alinda Sierras, MD  metaxalone (SKELAXIN) 800 MG tablet Take 800 mg by mouth 3 (three) times daily.   Yes [provider]  sucralfate (CARAFATE) 1 GM/10ML suspension Take 10 mLs (1 g total) by mouth 3 (three) times daily with meals. 02/24/21  Yes Scot Jun, FNP  albuterol [facility] HFA) 108 (90 Base) MCG/ACT inhaler Inhale 1-2 puffs into the lungs every 6 (six) hours as needed for wheezing or shortness of breath. 01/19/21   Burchette, Alinda Sierras, MD  ALPRAZolam Duanne Moron) 0.5 MG tablet Take 1 tablet (0.5 mg total) by mouth 2 (two) times daily. 01/19/21   Burchette, Alinda Sierras, MD  ascorbic acid (VITAMIN C) 1000 MG tablet Take 1,000 mg by mouth daily.    [provider]  calcium carbonate (OS-CAL - DOSED IN MG OF ELEMENTAL CALCIUM) 1250 MG tablet Take 1 tablet by mouth daily.    [provider]  carboxymethylcellulose (REFRESH PLUS) 0.5 % SOLN Take 1 drop by mouth daily as needed. For dry eyes    [provider]  cetirizine (ZYRTEC) 10 MG tablet Take 10 mg by mouth daily.    [provider]  colchicine 0.6 MG tablet Take 1 tablet (0.6 mg total) by mouth daily. 11/18/20   Burchette, Alinda Sierras, MD  cycloSPORINE (RESTASIS) 0.05 % ophthalmic emulsion Place 1 drop into both eyes 2 (two) times daily.    [provider]  dexamethasone 0.5 MG/5ML elixir Take two teaspoons  and swish, gargle, and spit qid prn canker sores. 11/18/20   Burchette, Alinda Sierras, MD  dronabinol (MARINOL) 5 MG capsule dronabinol 5 mg capsule  TAKE ONE CAPSULE BY MOUTH TWICE A DAY    [provider]  Eptinezumab-jjmr (VYEPTI IV) Inject into the vein.    [provider]  erythromycin ophthalmic ointment at bedtime. 02/12/21   [provider]  fluticasone (FLONASE) 50 MCG/ACT nasal spray Place 2 sprays into both nostrils daily as needed. For nasal congestion 01/19/21 12/27/22  Burchette, Alinda Sierras, MD  fluticasone furoate-vilanterol (BREO  ELLIPTA) 100-25 MCG/INH AEPB Inhale 1 puff into the lungs daily. 07/21/20   Burchette, Alinda Sierras, MD  frovatriptan (FROVA) 2.5 MG tablet Take 2.5 mg by mouth as needed. If recurs, may repeat after 2 hours. Max of 3 tabs in 24 hours. For migraines.    [provider]  Galcanezumab-gnlm 120 MG/ML SOSY Inject into the skin.    [provider]  ketorolac (ACULAR) 0.4 % SOLN Place 1 drop into the left eye 4 (four) times daily. 12/11/19   [provider]  lidocaine (LIDODERM) 5 % Place 1 patch onto the skin daily. Remove & Discard patch within 12 hours or as directed by MD 04/12/18   Eulas Post, MD  montelukast (SINGULAIR) 10 MG tablet TAKE 1 TABLET (10 MG TOTAL) BY MOUTH DAILY AT 6 PM. 07/21/20   Burchette, Alinda Sierras, MD  Multiple Vitamin (MULTIVITAMIN) tablet Take 1 tablet by mouth every morning.    [provider]  Omega-3 Fatty Acids (FISH OIL) 1200 MG CAPS Take 1 capsule by mouth every evening.    [provider]  OPTIVE 0.5-0.9 % ophthalmic solution SMARTSIG:1 Drop(s) In Eye(s) PRN 12/10/19   [provider]  potassium chloride (KLOR-CON) 10 MEQ tablet Take two tablets once daily 01/19/21   Burchette, Alinda Sierras, MD  RABEprazole (ACIPHEX) 20 MG tablet Take 1 tablet (20 mg total) by mouth daily. 07/21/20   Burchette, Alinda Sierras, MD  Rimegepant Sulfate (NURTEC) 75 MG TBDP Take by mouth. 10/03/19    [provider]  rosuvastatin (CRESTOR) 10 MG tablet Take 1 tablet (10 mg total) by mouth daily. 07/23/20   Burchette, Alinda Sierras, MD  silodosin (RAPAFLO) 8 MG CAPS capsule Take 1 capsule (8 mg total) by mouth daily with breakfast. 07/22/20   Burchette, Alinda Sierras, MD  tiZANidine (ZANAFLEX) 2 MG tablet Take 2 mg by mouth at bedtime.    [provider]  torsemide (DEMADEX) 10 MG tablet Take 1 tablet (10 mg total) by mouth daily. 07/21/20   Burchette, Alinda Sierras, MD  vitamin E 400 UNIT capsule Take 400 Units by mouth daily at 6 PM.    [provider]    Family History Family History  Problem Relation Age of Onset   Ovarian cancer Mother 37   Hypertension Father    Prostate cancer Father    Heart attack Father    Heart attack Maternal Grandmother    Stroke Paternal Grandmother     Social History Social History   Tobacco Use   Smoking status: Former    Packs/day: 2.00    Years: 30.00    Pack years: 60.00    Types: Cigarettes    Start date: 38    Quit date: 02/04/2012    Years since quitting: 9.0   Smokeless tobacco: Never   Tobacco comments:    nicotine in vapor and is very low nicotine   Vaping Use   Vaping Use: Every day  Substance Use Topics   Alcohol use: No    Alcohol/week: 0.0 standard drinks    Comment: quit 2000   Drug use: No     Allergies   Cefazolin, Baclofen, Carbamazepine, Duloxetine, Fentanyl, Gabapentin, Naproxen, Oxcarbazepine, Pregabalin, Serotonin reuptake inhibitors (ssris), Topiramate, Venlafaxine, Zonisamide, Ceclor [cefaclor], Milnacipran, and Ondansetron   Review of Systems Review of Systems  Constitutional:  Negative for chills and fever.  HENT:  Positive for trouble swallowing and voice change. Negative for congestion, ear pain and sore throat.   Respiratory:  Negative  for cough and shortness of breath.   Cardiovascular:  Negative for chest pain and palpitations.  Gastrointestinal:  Negative for abdominal pain,  constipation, diarrhea and vomiting.  Skin:  Negative for color change and rash.  All other systems reviewed and are negative.   Physical Exam Triage Vital Signs ED Triage Vitals [02/26/21 1424]  Enc Vitals Group     BP (!) 141/84     Pulse Rate (!) 102     Resp (!) 27     Temp 98.2 F (36.8 C)     Temp Source Oral     SpO2 96 %     Weight      Height      Head Circumference      Peak Flow      Pain Score      Pain Loc      Pain Edu?      Excl. in [place]?    No data found.  Updated Vital Signs BP (!) 141/84 (BP Location: Left Arm)   Pulse (!) 102   Temp 98.2 F (36.8 C) (Oral)   Resp (!) 27   LMP 07/26/1992   SpO2 96%   Visual Acuity Right Eye Distance:   Left Eye Distance:   Bilateral Distance:    Right Eye Near:   Left Eye Near:    Bilateral Near:     Physical Exam Vitals and nursing note reviewed.  Constitutional:      General: She is not in acute distress.    Appearance: She is well-developed. She is obese. She is not ill-appearing.  HENT:     Head: Normocephalic and atraumatic.     Mouth/Throat:     Mouth: Mucous membranes are moist.     Pharynx: Oropharynx is clear.     Comments: Speech clear.  No oropharyngeal swelling.  No difficulty swallowing. Eyes:     Conjunctiva/sclera: Conjunctivae normal.  Cardiovascular:     Rate and Rhythm: Normal rate and regular rhythm.     Heart sounds: Normal heart sounds.  Pulmonary:     Effort: Pulmonary effort is normal. No respiratory distress.     Breath sounds: Normal breath sounds. No wheezing, rhonchi or rales.     Comments: No respiratory distress.  Good air movement. Abdominal:     Palpations: Abdomen is soft.     Tenderness: There is no abdominal tenderness.  Musculoskeletal:     Cervical back: Neck supple.  Skin:    General: Skin is warm and dry.  Neurological:     General: No focal deficit present.     Mental Status: She is alert and oriented to person, place, and time.     Gait: Gait normal.   Psychiatric:        Mood and Affect: Mood normal.        Behavior: Behavior normal.     UC Treatments / Results  Labs (all labs ordered are listed, but only abnormal results are displayed) Labs Reviewed - No data to display  EKG   Radiology DG Chest 2 View  Result Date: 02/24/2021 CLINICAL DATA:  Pain and pressure to chest. Choked on porch out 4 days ago. Still feels as though there is something in the middle over chest. EXAM: CHEST - 2 VIEW COMPARISON:  May 06, 2019 FINDINGS: Trachea is midline. Cardiomediastinal contours and hilar structures are stable. Linear opacities at the LEFT lung base are unchanged since October of 2020. No lobar consolidation. No sign of pleural  effusion. No pneumothorax. On limited assessment no acute skeletal process. IMPRESSION: Stable scarring at the LEFT lung base. No acute findings. Electronically Signed   By: Zetta Bills M.D.   On: 02/24/2021 15:50    Procedures Procedures (including critical care time)  Medications Ordered in UC Medications - No data to display  Initial Impression / Assessment and Plan / UC Course  I have reviewed the triage vital signs and the nursing notes.  Pertinent labs & imaging results that were available during my care of the patient were reviewed by me and considered in my medical decision making (see chart for details).  Dysphagia, GERD.  EKG shows sinus rhythm, rate 96, no ST elevation, compared to previous from 05/06/2019.  Patient is in no distress; she is well-appearing.  No difficulty swallowing or breathing.  Instructed patient to go to the ED if she has acute shortness of breath or other concerning symptoms such as choking.  Instructed her to follow-up with her PCP tomorrow.  Discussed possible need for swallow study or endoscopy.  Education provided on dysphagia and GERD.  Instructed patient to take small bites and make sure she is chewing thoroughly.  She agrees to plan of care.   Final Clinical  Impressions(s) / UC Diagnoses   Final diagnoses:  Dysphagia, unspecified type  Gastroesophageal reflux disease, unspecified whether esophagitis present     Discharge Instructions      Go to the emergency department if you have acute shortness of breath or choking.  Follow-up with your primary care provider tomorrow.         ED Prescriptions   None    PDMP not reviewed this encounter.   Sharion Balloon, NP 02/26/21 [phone]

## 2021-02-26 NOTE — ED Triage Notes (Signed)
Patient c/o choking and SOB that started approximately at 1330 today.   Patient endorses a previous episode that occurred approximately a week ago per patient statement. Patient states she was seen at a [facility] clinic and given Carafate for symptoms.   Patient endorses increased choking when attempting to eat at times.   Patient endorses hoarseness and chest pain.   Patient denies N/V/D.   Patient denies previous history of respiratory or cardiac problems.

## 2021-02-26 NOTE — Discharge Instructions (Addendum)
Go to the emergency department if you have acute shortness of breath or choking.  Follow-up with your primary care provider tomorrow.

## 2021-02-27 ENCOUNTER — Ambulatory Visit (INDEPENDENT_AMBULATORY_CARE_PROVIDER_SITE_OTHER): Payer: Medicare Other | Admitting: Internal Medicine

## 2021-02-27 ENCOUNTER — Encounter: Payer: Self-pay | Admitting: Internal Medicine

## 2021-02-27 VITALS — BP 130/80 | HR 75 | Temp 98.0°F | Wt 249.0 lb

## 2021-02-27 DIAGNOSIS — R1319 Other dysphagia: Secondary | ICD-10-CM

## 2021-02-27 DIAGNOSIS — T18128A Food in esophagus causing other injury, initial encounter: Secondary | ICD-10-CM | POA: Diagnosis not present

## 2021-02-27 NOTE — Progress Notes (Signed)
Acute Office Visit     This visit occurred during the SARS-CoV-2 public health emergency.  Safety protocols were in place, including screening questions prior to the visit, additional usage of staff PPE, and extensive cleaning of exam room while observing appropriate contact time as indicated for disinfecting solutions.    CC/Reason for Visit: Choking on food and trouble swallowing  HPI: Ashley Castaneda is a 70 y.o. female who is coming in today for the above mentioned reasons.  She is a patient of Dr. Elease Hashimoto.  Last week she choked while eating a pork sandwich.  It was severe.  She could not breathe.  She tells me that she was seconds away from passing out when the food obstruction was relieved by a Heimlich maneuver performed by her husband.  They did not call 911 or seek medical attention.  Ever since then she has had esophageal pain and the sensation that food is still stuck in her esophagus.  She has been mainly surviving on meal replacement shakes.  She tells me now that even swallowing water hurts.  5 days ago she went to urgent care for this and was prescribed Carafate and asked to follow-up with her PCP.  Yesterday, while eating ice cream at [facility], she again had another choking episode although not as severe.  Her voice is now hoarse.  She does not feel short of breath currently but states that at times she does feel like she struggles.  While at urgent care she had a chest x-ray that was reported as normal.  Past Medical/Surgical History: Past Medical History:  Diagnosis Date   Adenomatous colon polyp 09/2008   ALLERGIC RHINITIS 07/01/2008   ANXIETY 07/01/2008   BACK PAIN, THORACIC REGION 03/04/2010   BRONCHITIS, CHRONIC 07/01/2008   CARPAL TUNNEL SYNDROME, HX OF 07/01/2008   CHRONIC OBSTRUCTIVE PULMONARY DISEASE, ACUTE EXACERBATION 08/07/2010   DEPRESSION 07/01/2008   DVT, HX OF 07/01/2008   ECZEMA 05/29/2009   FACIAL PAIN 12/25/2009   FIBROMYALGIA 07/01/2008   GERD 07/01/2008    Hiatal hernia    Hyperlipidemia    LEG PAIN, BILATERAL 07/01/2008   MIGRAINES, HX OF 07/01/2008   OCCIPITAL NEURALGIA 07/01/2008   OSTEOARTHRITIS 07/01/2008   Other specified trigeminal nerve disorders 07/01/2008   REFLEX SYMPATHETIC DYSTROPHY 07/01/2008   Trigeminal neuralgia 05/21/2009   Unspecified vitamin D deficiency 03/31/2010    Past Surgical History:  Procedure Laterality Date   CARPAL TUNNEL RELEASE  07/27/2007   RIGHT   HEAD CRANIECTOMY  07/26/1992   MICROVASULAR DECOMPRESSION   LAVH     BSO   LEFT KNEE SURGERY  07/27/1987   OCCIPITAL NERVE STIMULATOR INSERTION     RIGHT FOOT BONE SPUR  07/26/1985   RIGHT FOOT SURGERY  07/26/1996   BAD CUT   RIGHT JAW SURGERY  07/26/2000   RIGHT KNEE SURGERY  07/26/1988   RIGHT [place], 1993, 1995, 1999   RIGHT THUMB SUGERY  07/26/2002   tear duct surgery     TONSILLECTOMY  07/27/1955    Social History:  reports that she quit smoking about 9 years ago. Her smoking use included cigarettes. She started smoking about 35 years ago. She has a 60.00 pack-year smoking history. She has never used smokeless tobacco. She reports that she does not drink alcohol and does not use drugs.  Allergies: Allergies  Allergen Reactions   Cefazolin Anaphylaxis, Swelling and Other (See Comments)    Ancef - in surgery, swelled up,  turned blue, heart problems Turns Blue   Baclofen Other (See Comments)    REACTION: confusion REACTION: confusion REACTION: confusion REACTION: confusion   Carbamazepine Other (See Comments)    REACTION: confusion Other reaction(s): Confusion Confusion,Bad reactions to all seizure medicine   Duloxetine Other (See Comments)   Fentanyl Nausea And Vomiting and Other (See Comments)    REACTION: nausea and vomiting   Gabapentin Other (See Comments)    REACTION: confusion   Naproxen Other (See Comments)    Elevates blood pressure   Oxcarbazepine Other (See Comments)    REACTION: confusion Other reaction(s):  Other REACTION: confusion REACTION: confusion REACTION: confusion    Pregabalin Other (See Comments)    Other reaction(s): Other   Serotonin Reuptake Inhibitors (Ssris) Other (See Comments)   Topiramate Other (See Comments)    Other reaction(s): Unknown   Venlafaxine Other (See Comments)    Other reaction(s): Unknown   Zonisamide Other (See Comments)    Other reaction(s): Other   Ceclor [Cefaclor]     hives   Milnacipran Other (See Comments)   Ondansetron Nausea And Vomiting    Nausea and vomiting after IV administration    Family History:  Family History  Problem Relation Age of Onset   Ovarian cancer Mother 35   Hypertension Father    Prostate cancer Father    Heart attack Father    Heart attack Maternal Grandmother    Stroke Paternal Grandmother      Current Outpatient Medications:    albuterol (PROAIR HFA) 108 (90 Base) MCG/ACT inhaler, Inhale 1-2 puffs into the lungs every 6 (six) hours as needed for wheezing or shortness of breath., Disp: 8 g, Rfl: 1   ALPRAZolam (XANAX) 0.5 MG tablet, Take 1 tablet (0.5 mg total) by mouth 2 (two) times daily., Disp: 180 tablet, Rfl: 1   ascorbic acid (VITAMIN C) 1000 MG tablet, Take 1,000 mg by mouth daily., Disp: , Rfl:    aspirin 325 MG tablet, daily., Disp: , Rfl:    azithromycin (ZITHROMAX) 250 MG tablet, Take 2 tabs PO x 1 dose, then 1 tab PO QD x 4 days, Disp: 6 tablet, Rfl: 0   calcium carbonate (OS-CAL - DOSED IN MG OF ELEMENTAL CALCIUM) 1250 MG tablet, Take 1 tablet by mouth daily., Disp: , Rfl:    carboxymethylcellulose (REFRESH PLUS) 0.5 % SOLN, Take 1 drop by mouth daily as needed. For dry eyes, Disp: , Rfl:    cetirizine (ZYRTEC) 10 MG tablet, Take 10 mg by mouth daily., Disp: , Rfl:    colchicine 0.6 MG tablet, Take 1 tablet (0.6 mg total) by mouth daily., Disp: 90 tablet, Rfl: 3   cycloSPORINE (RESTASIS) 0.05 % ophthalmic emulsion, Place 1 drop into both eyes 2 (two) times daily., Disp: , Rfl:    dexamethasone 0.5  MG/5ML elixir, Take two teaspoons and swish, gargle, and spit qid prn canker sores., Disp: 237 mL, Rfl: 2   dronabinol (MARINOL) 5 MG capsule, dronabinol 5 mg capsule  TAKE ONE CAPSULE BY MOUTH TWICE A DAY, Disp: , Rfl:    Eptinezumab-jjmr (VYEPTI IV), Inject into the vein., Disp: , Rfl:    erythromycin ophthalmic ointment, at bedtime., Disp: , Rfl:    fluticasone (FLONASE) 50 MCG/ACT nasal spray, Place 2 sprays into both nostrils daily as needed. For nasal congestion, Disp: 16 g, Rfl: 3   fluticasone furoate-vilanterol (BREO ELLIPTA) 100-25 MCG/INH AEPB, Inhale 1 puff into the lungs daily., Disp: 3 each, Rfl: 3   frovatriptan (FROVA) 2.5  MG tablet, Take 2.5 mg by mouth as needed. If recurs, may repeat after 2 hours. Max of 3 tabs in 24 hours. For migraines., Disp: , Rfl:    Galcanezumab-gnlm 120 MG/ML SOSY, Inject into the skin., Disp: , Rfl:    ketorolac (ACULAR) 0.4 % SOLN, Place 1 drop into the left eye 4 (four) times daily., Disp: , Rfl:    lidocaine (LIDODERM) 5 %, Place 1 patch onto the skin daily. Remove & Discard patch within 12 hours or as directed by MD, Disp: 30 patch, Rfl: 1   meclizine (ANTIVERT) 25 MG tablet, TAKE 1 TABLET BY MOUTH 4 TIMES A DAY AS NEEDED FOR DIZZINESS, Disp: 120 tablet, Rfl: 1   meloxicam (MOBIC) 15 MG tablet, Take 1 tablet (15 mg total) by mouth daily., Disp: 90 tablet, Rfl: 3   metaxalone (SKELAXIN) 800 MG tablet, Take 800 mg by mouth 3 (three) times daily., Disp: , Rfl:    montelukast (SINGULAIR) 10 MG tablet, TAKE 1 TABLET (10 MG TOTAL) BY MOUTH DAILY AT 6 PM., Disp: 90 tablet, Rfl: 3   Multiple Vitamin (MULTIVITAMIN) tablet, Take 1 tablet by mouth every morning., Disp: , Rfl:    Omega-3 Fatty Acids (FISH OIL) 1200 MG CAPS, Take 1 capsule by mouth every evening., Disp: , Rfl:    OPTIVE 0.5-0.9 % ophthalmic solution, SMARTSIG:1 Drop(s) In Eye(s) PRN, Disp: , Rfl:    potassium chloride (KLOR-CON) 10 MEQ tablet, Take two tablets once daily, Disp: 90 tablet, Rfl: 3    RABEprazole (ACIPHEX) 20 MG tablet, Take 1 tablet (20 mg total) by mouth daily., Disp: 90 tablet, Rfl: 3   Rimegepant Sulfate (NURTEC) 75 MG TBDP, Take by mouth., Disp: , Rfl:    rosuvastatin (CRESTOR) 10 MG tablet, Take 1 tablet (10 mg total) by mouth daily., Disp: 90 tablet, Rfl: 3   silodosin (RAPAFLO) 8 MG CAPS capsule, Take 1 capsule (8 mg total) by mouth daily with breakfast., Disp: 90 capsule, Rfl: 0   sucralfate (CARAFATE) 1 GM/10ML suspension, Take 10 mLs (1 g total) by mouth 3 (three) times daily with meals., Disp: 414 mL, Rfl: 0   tiZANidine (ZANAFLEX) 2 MG tablet, Take 2 mg by mouth at bedtime., Disp: , Rfl:    torsemide (DEMADEX) 10 MG tablet, Take 1 tablet (10 mg total) by mouth daily., Disp: 90 tablet, Rfl: 3   vitamin E 400 UNIT capsule, Take 400 Units by mouth daily at 6 PM., Disp: , Rfl:   Review of Systems:  Constitutional: Denies fever, chills, diaphoresis, appetite change and fatigue.  HEENT: Denies photophobia, eye pain, redness, hearing loss, ear pain, congestion, sore throat, rhinorrhea, sneezing, mouth sores, neck stiffness and tinnitus.   Respiratory: Denies  cough, chest tightness,  and wheezing.   Cardiovascular: Denies  palpitations and leg swelling.  Gastrointestinal: Denies diarrhea, constipation, blood in stool and abdominal distention.  Genitourinary: Denies dysuria, urgency, frequency, hematuria, flank pain and difficulty urinating.  Endocrine: Denies: hot or cold intolerance, sweats, changes in hair or nails, polyuria, polydipsia. Musculoskeletal: Denies myalgias, back pain, joint swelling, arthralgias and gait problem.  Skin: Denies pallor, rash and wound.  Neurological: Denies dizziness, seizures, syncope, weakness, light-headedness, numbness and headaches.  Hematological: Denies adenopathy. Easy bruising, personal or family bleeding history  Psychiatric/Behavioral: Denies suicidal ideation, mood changes, confusion, nervousness, sleep disturbance and  agitation    Physical Exam: Vitals:   02/27/21 1331  BP: 130/80  Pulse: 75  Temp: 98 F (36.7 C)  TempSrc: Oral  SpO2: 98%  Weight: 249 lb (112.9 kg)    Body mass index is 44.11 kg/m.   Constitutional: NAD, calm, comfortable, obese Eyes: PERRL, lids and conjunctivae normal, wears corrective lenses ENMT: Mucous membranes are moist. Posterior pharynx clear of any exudate or lesions.  Neck: normal, supple, no masses, no thyromegaly Respiratory: clear to auscultation bilaterally, no wheezing, no crackles. Normal respiratory effort. No accessory muscle use.  Cardiovascular: Regular rate and rhythm, no murmurs / rubs / gallops. No extremity edema.  Psychiatric: Normal judgment and insight. Alert and oriented x 3. Normal mood.    Impression and Plan:  Esophageal dysphagia Food impaction of esophagus, initial encounter   -Strongly suspect food impaction versus esophageal trauma and feel like this needs to be worked up rather quickly. -Our referrals coordinator is trying to get an urgent GI appointment, however with it being late Friday afternoon, I have given her instructions to go to the emergency department for emergent care if her condition worsens over the weekend.  She currently has no signs of respiratory distress.  Time spent: 31 minutes reviewing chart, interviewing and examining patient and formulating plan of care    Lenna Hagarty Isaac Bliss, MD [facility] at [facility]

## 2021-03-02 ENCOUNTER — Encounter: Payer: Self-pay | Admitting: Family Medicine

## 2021-03-02 NOTE — Telephone Encounter (Signed)
Please advise. Pt was offered a in office appointment tomorrow earlier this morning by Meriel Pica and refused. We do not have any in office slots available today.

## 2021-03-16 DIAGNOSIS — R1319 Other dysphagia: Secondary | ICD-10-CM | POA: Diagnosis not present

## 2021-03-16 DIAGNOSIS — K219 Gastro-esophageal reflux disease without esophagitis: Secondary | ICD-10-CM | POA: Diagnosis not present

## 2021-03-16 DIAGNOSIS — Z79899 Other long term (current) drug therapy: Secondary | ICD-10-CM | POA: Diagnosis not present

## 2021-03-17 ENCOUNTER — Other Ambulatory Visit: Payer: Self-pay | Admitting: Medical

## 2021-03-17 DIAGNOSIS — R1319 Other dysphagia: Secondary | ICD-10-CM

## 2021-03-18 DIAGNOSIS — G43719 Chronic migraine without aura, intractable, without status migrainosus: Secondary | ICD-10-CM | POA: Diagnosis not present

## 2021-03-19 DIAGNOSIS — H11442 Conjunctival cysts, left eye: Secondary | ICD-10-CM | POA: Diagnosis not present

## 2021-03-19 DIAGNOSIS — G514 Facial myokymia: Secondary | ICD-10-CM | POA: Diagnosis not present

## 2021-03-19 DIAGNOSIS — G4451 Hemicrania continua: Secondary | ICD-10-CM | POA: Diagnosis not present

## 2021-03-19 DIAGNOSIS — H04123 Dry eye syndrome of bilateral lacrimal glands: Secondary | ICD-10-CM | POA: Diagnosis not present

## 2021-03-19 DIAGNOSIS — Z79899 Other long term (current) drug therapy: Secondary | ICD-10-CM | POA: Diagnosis not present

## 2021-03-19 DIAGNOSIS — H0100A Unspecified blepharitis right eye, upper and lower eyelids: Secondary | ICD-10-CM | POA: Diagnosis not present

## 2021-03-19 DIAGNOSIS — Z9889 Other specified postprocedural states: Secondary | ICD-10-CM | POA: Diagnosis not present

## 2021-03-19 DIAGNOSIS — H11002 Unspecified pterygium of left eye: Secondary | ICD-10-CM | POA: Diagnosis not present

## 2021-03-19 DIAGNOSIS — H0289 Other specified disorders of eyelid: Secondary | ICD-10-CM | POA: Diagnosis not present

## 2021-03-19 DIAGNOSIS — H0100B Unspecified blepharitis left eye, upper and lower eyelids: Secondary | ICD-10-CM | POA: Diagnosis not present

## 2021-03-19 DIAGNOSIS — G500 Trigeminal neuralgia: Secondary | ICD-10-CM | POA: Diagnosis not present

## 2021-03-19 DIAGNOSIS — H11823 Conjunctivochalasis, bilateral: Secondary | ICD-10-CM | POA: Diagnosis not present

## 2021-03-19 DIAGNOSIS — Z961 Presence of intraocular lens: Secondary | ICD-10-CM | POA: Diagnosis not present

## 2021-03-24 ENCOUNTER — Ambulatory Visit
Admission: RE | Admit: 2021-03-24 | Discharge: 2021-03-24 | Disposition: A | Discharge status: Home / Self Care | Payer: Medicare Other | Source: Ambulatory Visit | Attending: Medical | Admitting: Medical

## 2021-03-24 DIAGNOSIS — R1319 Other dysphagia: Secondary | ICD-10-CM

## 2021-03-24 DIAGNOSIS — R1314 Dysphagia, pharyngoesophageal phase: Secondary | ICD-10-CM | POA: Diagnosis not present

## 2021-04-08 DIAGNOSIS — G9050 Complex regional pain syndrome I, unspecified: Secondary | ICD-10-CM | POA: Diagnosis not present

## 2021-04-08 DIAGNOSIS — R52 Pain, unspecified: Secondary | ICD-10-CM | POA: Diagnosis not present

## 2021-04-08 DIAGNOSIS — G8929 Other chronic pain: Secondary | ICD-10-CM | POA: Diagnosis not present

## 2021-04-08 DIAGNOSIS — G43719 Chronic migraine without aura, intractable, without status migrainosus: Secondary | ICD-10-CM | POA: Diagnosis not present

## 2021-04-08 DIAGNOSIS — B0229 Other postherpetic nervous system involvement: Secondary | ICD-10-CM | POA: Diagnosis not present

## 2021-04-08 DIAGNOSIS — G4486 Cervicogenic headache: Secondary | ICD-10-CM | POA: Diagnosis not present

## 2021-04-08 DIAGNOSIS — M62838 Other muscle spasm: Secondary | ICD-10-CM | POA: Diagnosis not present

## 2021-04-08 DIAGNOSIS — G932 Benign intracranial hypertension: Secondary | ICD-10-CM | POA: Diagnosis not present

## 2021-04-08 DIAGNOSIS — G43119 Migraine with aura, intractable, without status migrainosus: Secondary | ICD-10-CM | POA: Diagnosis not present

## 2021-04-08 DIAGNOSIS — G4701 Insomnia due to medical condition: Secondary | ICD-10-CM | POA: Diagnosis not present

## 2021-04-08 DIAGNOSIS — H04123 Dry eye syndrome of bilateral lacrimal glands: Secondary | ICD-10-CM | POA: Diagnosis not present

## 2021-04-08 DIAGNOSIS — G509 Disorder of trigeminal nerve, unspecified: Secondary | ICD-10-CM | POA: Diagnosis not present

## 2021-04-13 DIAGNOSIS — Z1231 Encounter for screening mammogram for malignant neoplasm of breast: Secondary | ICD-10-CM | POA: Diagnosis not present

## 2021-04-13 LAB — HM MAMMOGRAPHY

## 2021-04-16 ENCOUNTER — Encounter: Payer: Self-pay | Admitting: Family Medicine

## 2021-04-20 DIAGNOSIS — G43909 Migraine, unspecified, not intractable, without status migrainosus: Secondary | ICD-10-CM | POA: Diagnosis not present

## 2021-04-20 DIAGNOSIS — K1120 Sialoadenitis, unspecified: Secondary | ICD-10-CM | POA: Diagnosis not present

## 2021-04-20 DIAGNOSIS — K120 Recurrent oral aphthae: Secondary | ICD-10-CM | POA: Diagnosis not present

## 2021-04-30 DIAGNOSIS — U071 COVID-19: Secondary | ICD-10-CM | POA: Diagnosis not present

## 2021-05-03 DIAGNOSIS — J069 Acute upper respiratory infection, unspecified: Secondary | ICD-10-CM | POA: Diagnosis not present

## 2021-05-07 DIAGNOSIS — H0100B Unspecified blepharitis left eye, upper and lower eyelids: Secondary | ICD-10-CM | POA: Diagnosis not present

## 2021-05-07 DIAGNOSIS — H11002 Unspecified pterygium of left eye: Secondary | ICD-10-CM | POA: Diagnosis not present

## 2021-05-07 DIAGNOSIS — H0100A Unspecified blepharitis right eye, upper and lower eyelids: Secondary | ICD-10-CM | POA: Diagnosis not present

## 2021-05-07 DIAGNOSIS — Z9841 Cataract extraction status, right eye: Secondary | ICD-10-CM | POA: Diagnosis not present

## 2021-05-07 DIAGNOSIS — H04123 Dry eye syndrome of bilateral lacrimal glands: Secondary | ICD-10-CM | POA: Diagnosis not present

## 2021-05-07 DIAGNOSIS — H029 Unspecified disorder of eyelid: Secondary | ICD-10-CM | POA: Diagnosis not present

## 2021-05-07 DIAGNOSIS — H11823 Conjunctivochalasis, bilateral: Secondary | ICD-10-CM | POA: Diagnosis not present

## 2021-05-07 DIAGNOSIS — G500 Trigeminal neuralgia: Secondary | ICD-10-CM | POA: Diagnosis not present

## 2021-05-07 DIAGNOSIS — H11442 Conjunctival cysts, left eye: Secondary | ICD-10-CM | POA: Diagnosis not present

## 2021-05-07 DIAGNOSIS — Z9842 Cataract extraction status, left eye: Secondary | ICD-10-CM | POA: Diagnosis not present

## 2021-05-26 DIAGNOSIS — Z23 Encounter for immunization: Secondary | ICD-10-CM | POA: Diagnosis not present

## 2021-05-27 DIAGNOSIS — R682 Dry mouth, unspecified: Secondary | ICD-10-CM | POA: Diagnosis not present

## 2021-05-27 DIAGNOSIS — H04129 Dry eye syndrome of unspecified lacrimal gland: Secondary | ICD-10-CM | POA: Diagnosis not present

## 2021-05-27 DIAGNOSIS — H04123 Dry eye syndrome of bilateral lacrimal glands: Secondary | ICD-10-CM | POA: Diagnosis not present

## 2021-06-08 DIAGNOSIS — K121 Other forms of stomatitis: Secondary | ICD-10-CM | POA: Diagnosis not present

## 2021-06-08 DIAGNOSIS — L853 Xerosis cutis: Secondary | ICD-10-CM | POA: Diagnosis not present

## 2021-06-08 DIAGNOSIS — G8929 Other chronic pain: Secondary | ICD-10-CM | POA: Diagnosis not present

## 2021-06-08 DIAGNOSIS — Z888 Allergy status to other drugs, medicaments and biological substances status: Secondary | ICD-10-CM | POA: Diagnosis not present

## 2021-06-08 DIAGNOSIS — Z882 Allergy status to sulfonamides status: Secondary | ICD-10-CM | POA: Diagnosis not present

## 2021-06-08 DIAGNOSIS — F17290 Nicotine dependence, other tobacco product, uncomplicated: Secondary | ICD-10-CM | POA: Diagnosis not present

## 2021-06-08 DIAGNOSIS — M3500 Sicca syndrome, unspecified: Secondary | ICD-10-CM | POA: Diagnosis not present

## 2021-06-08 DIAGNOSIS — Z8669 Personal history of other diseases of the nervous system and sense organs: Secondary | ICD-10-CM | POA: Diagnosis not present

## 2021-06-08 DIAGNOSIS — R52 Pain, unspecified: Secondary | ICD-10-CM | POA: Diagnosis not present

## 2021-06-08 DIAGNOSIS — Z880 Allergy status to penicillin: Secondary | ICD-10-CM | POA: Diagnosis not present

## 2021-06-08 DIAGNOSIS — Z881 Allergy status to other antibiotic agents status: Secondary | ICD-10-CM | POA: Diagnosis not present

## 2021-06-08 DIAGNOSIS — Z886 Allergy status to analgesic agent status: Secondary | ICD-10-CM | POA: Diagnosis not present

## 2021-06-08 DIAGNOSIS — G629 Polyneuropathy, unspecified: Secondary | ICD-10-CM | POA: Diagnosis not present

## 2021-06-08 DIAGNOSIS — F17210 Nicotine dependence, cigarettes, uncomplicated: Secondary | ICD-10-CM | POA: Diagnosis not present

## 2021-06-08 DIAGNOSIS — R519 Headache, unspecified: Secondary | ICD-10-CM | POA: Diagnosis not present

## 2021-06-08 DIAGNOSIS — Z885 Allergy status to narcotic agent status: Secondary | ICD-10-CM | POA: Diagnosis not present

## 2021-06-12 DIAGNOSIS — K121 Other forms of stomatitis: Secondary | ICD-10-CM | POA: Diagnosis not present

## 2021-06-12 DIAGNOSIS — R52 Pain, unspecified: Secondary | ICD-10-CM | POA: Diagnosis not present

## 2021-06-12 DIAGNOSIS — G629 Polyneuropathy, unspecified: Secondary | ICD-10-CM | POA: Diagnosis not present

## 2021-06-12 DIAGNOSIS — L853 Xerosis cutis: Secondary | ICD-10-CM | POA: Diagnosis not present

## 2021-06-17 DIAGNOSIS — G43719 Chronic migraine without aura, intractable, without status migrainosus: Secondary | ICD-10-CM | POA: Diagnosis not present

## 2021-06-25 DIAGNOSIS — G514 Facial myokymia: Secondary | ICD-10-CM | POA: Diagnosis not present

## 2021-06-25 DIAGNOSIS — Z9841 Cataract extraction status, right eye: Secondary | ICD-10-CM | POA: Diagnosis not present

## 2021-06-25 DIAGNOSIS — Z9842 Cataract extraction status, left eye: Secondary | ICD-10-CM | POA: Diagnosis not present

## 2021-06-25 DIAGNOSIS — H04123 Dry eye syndrome of bilateral lacrimal glands: Secondary | ICD-10-CM | POA: Diagnosis not present

## 2021-06-25 DIAGNOSIS — Z961 Presence of intraocular lens: Secondary | ICD-10-CM | POA: Diagnosis not present

## 2021-06-25 DIAGNOSIS — G500 Trigeminal neuralgia: Secondary | ICD-10-CM | POA: Diagnosis not present

## 2021-06-25 DIAGNOSIS — H11002 Unspecified pterygium of left eye: Secondary | ICD-10-CM | POA: Diagnosis not present

## 2021-06-25 DIAGNOSIS — H0100A Unspecified blepharitis right eye, upper and lower eyelids: Secondary | ICD-10-CM | POA: Diagnosis not present

## 2021-06-25 DIAGNOSIS — H0289 Other specified disorders of eyelid: Secondary | ICD-10-CM | POA: Diagnosis not present

## 2021-06-25 DIAGNOSIS — H11442 Conjunctival cysts, left eye: Secondary | ICD-10-CM | POA: Diagnosis not present

## 2021-06-25 DIAGNOSIS — H11823 Conjunctivochalasis, bilateral: Secondary | ICD-10-CM | POA: Diagnosis not present

## 2021-06-25 DIAGNOSIS — H0100B Unspecified blepharitis left eye, upper and lower eyelids: Secondary | ICD-10-CM | POA: Diagnosis not present

## 2021-06-25 DIAGNOSIS — Z48810 Encounter for surgical aftercare following surgery on the sense organs: Secondary | ICD-10-CM | POA: Diagnosis not present

## 2021-07-16 ENCOUNTER — Ambulatory Visit (INDEPENDENT_AMBULATORY_CARE_PROVIDER_SITE_OTHER): Payer: Medicare Other

## 2021-07-16 ENCOUNTER — Telehealth: Payer: Self-pay | Admitting: *Deleted

## 2021-07-16 VITALS — Ht 63.0 in | Wt 249.0 lb

## 2021-07-16 DIAGNOSIS — Z0000 Encounter for general adult medical examination without abnormal findings: Secondary | ICD-10-CM

## 2021-07-16 NOTE — Telephone Encounter (Signed)
Patient requests refills :  dexamethasone 0.5 MG/5ML elixir  And   Xanax

## 2021-07-16 NOTE — Patient Instructions (Addendum)
Ms. Ashley Castaneda , Thank you for taking time to come for your Medicare Wellness Visit. I appreciate your ongoing commitment to your health goals. Please review the following plan we discussed and let me know if I can assist you in the future.   These are the goals we discussed:  Goals      Exercise 3x per week (30 min per time)     Weight (lb) < 200 lb (90.7 kg)     Will continue your weight loss program.          This is a list of the screening recommended for you and due dates:  Health Maintenance  Topic Date Due   Hepatitis C Screening: USPSTF Recommendation to screen - Ages 86-79 yo.  Never done   Zoster (Shingles) Vaccine (2 of 2) 10/14/2021*   Tetanus Vaccine  07/16/2022*   Mammogram  04/13/2022   Colon Cancer Screening  02/05/2025   Pneumonia Vaccine  Completed   Flu Shot  Completed   DEXA scan (bone density measurement)  Completed   COVID-19 Vaccine  Completed   HPV Vaccine  Aged Out  *Topic was postponed. The date shown is not the original due date.     Advanced directives: No   Conditions/risks identified: None  Next appointment: Follow up in one year for your annual wellness visit   Preventive Care 65 Years and Older, Female Preventive care refers to lifestyle choices and visits with your health care provider that can promote health and wellness. What does preventive care include? A yearly physical exam. This is also called an annual well check. Dental exams once or twice a year. Routine eye exams. Ask your health care provider how often you should have your eyes checked. Personal lifestyle choices, including: Daily care of your teeth and gums. Regular physical activity. Eating a healthy diet. Avoiding tobacco and drug use. Limiting alcohol use. Practicing safe sex. Taking low-dose aspirin every day. Taking vitamin and mineral supplements as recommended by your health care provider. What happens during an annual well check? The services and screenings done by  your health care provider during your annual well check will depend on your age, overall health, lifestyle risk factors, and family history of disease. Counseling  Your health care provider may ask you questions about your: Alcohol use. Tobacco use. Drug use. Emotional well-being. Home and relationship well-being. Sexual activity. Eating habits. History of falls. Memory and ability to understand (cognition). Work and work Statistician. Reproductive health. Screening  You may have the following tests or measurements: Height, weight, and BMI. Blood pressure. Lipid and cholesterol levels. These may be checked every 5 years, or more frequently if you are over 36 years old. Skin check. Lung cancer screening. You may have this screening every year starting at age 10 if you have a 30-pack-year history of smoking and currently smoke or have quit within the past 15 years. Fecal occult blood test (FOBT) of the stool. You may have this test every year starting at age 42. Flexible sigmoidoscopy or colonoscopy. You may have a sigmoidoscopy every 5 years or a colonoscopy every 10 years starting at age 15. Hepatitis C blood test. Hepatitis B blood test. Sexually transmitted disease (STD) testing. Diabetes screening. This is done by checking your blood sugar (glucose) after you have not eaten for a while (fasting). You may have this done every 1-3 years. Bone density scan. This is done to screen for osteoporosis. You may have this done starting at age 22. Mammogram.  This may be done every 1-2 years. Talk to your health care provider about how often you should have regular mammograms. Talk with your health care provider about your test results, treatment options, and if necessary, the need for more tests. Vaccines  Your health care provider may recommend certain vaccines, such as: Influenza vaccine. This is recommended every year. Tetanus, diphtheria, and acellular pertussis (Tdap, Td) vaccine. You  may need a Td booster every 10 years. Zoster vaccine. You may need this after age 22. Pneumococcal 13-valent conjugate (PCV13) vaccine. One dose is recommended after age 70. Pneumococcal polysaccharide (PPSV23) vaccine. One dose is recommended after age 66. Talk to your health care provider about which screenings and vaccines you need and how often you need them. This information is not intended to replace advice given to you by your health care provider. Make sure you discuss any questions you have with your health care provider. Document Released: 08/08/2015 Document Revised: 03/31/2016 Document Reviewed: 05/13/2015 Elsevier Interactive Patient Education  2017 [place] Prevention in the Home Falls can cause injuries. They can happen to people of all ages. There are many things you can do to make your home safe and to help prevent falls. What can I do on the outside of my home? Regularly fix the edges of walkways and driveways and fix any cracks. Remove anything that might make you trip as you walk through a door, such as a raised step or threshold. Trim any bushes or trees on the path to your home. Use bright outdoor lighting. Clear any walking paths of anything that might make someone trip, such as rocks or tools. Regularly check to see if handrails are loose or broken. Make sure that both sides of any steps have handrails. Any raised decks and porches should have guardrails on the edges. Have any leaves, snow, or ice cleared regularly. Use sand or salt on walking paths during winter. Clean up any spills in your garage right away. This includes oil or grease spills. What can I do in the bathroom? Use night lights. Install grab bars by the toilet and in the tub and shower. Do not use towel bars as grab bars. Use non-skid mats or decals in the tub or shower. If you need to sit down in the shower, use a plastic, non-slip stool. Keep the floor dry. Clean up any water that spills  on the floor as soon as it happens. Remove soap buildup in the tub or shower regularly. Attach bath mats securely with double-sided non-slip rug tape. Do not have throw rugs and other things on the floor that can make you trip. What can I do in the bedroom? Use night lights. Make sure that you have a light by your bed that is easy to reach. Do not use any sheets or blankets that are too big for your bed. They should not hang down onto the floor. Have a firm chair that has side arms. You can use this for support while you get dressed. Do not have throw rugs and other things on the floor that can make you trip. What can I do in the kitchen? Clean up any spills right away. Avoid walking on wet floors. Keep items that you use a lot in easy-to-reach places. If you need to reach something above you, use a strong step stool that has a grab bar. Keep electrical cords out of the way. Do not use floor polish or wax that makes floors slippery. If you  must use wax, use non-skid floor wax. Do not have throw rugs and other things on the floor that can make you trip. What can I do with my stairs? Do not leave any items on the stairs. Make sure that there are handrails on both sides of the stairs and use them. Fix handrails that are broken or loose. Make sure that handrails are as long as the stairways. Check any carpeting to make sure that it is firmly attached to the stairs. Fix any carpet that is loose or worn. Avoid having throw rugs at the top or bottom of the stairs. If you do have throw rugs, attach them to the floor with carpet tape. Make sure that you have a light switch at the top of the stairs and the bottom of the stairs. If you do not have them, ask someone to add them for you. What else can I do to help prevent falls? Wear shoes that: Do not have high heels. Have rubber bottoms. Are comfortable and fit you well. Are closed at the toe. Do not wear sandals. If you use a stepladder: Make  sure that it is fully opened. Do not climb a closed stepladder. Make sure that both sides of the stepladder are locked into place. Ask someone to hold it for you, if possible. Clearly mark and make sure that you can see: Any grab bars or handrails. First and last steps. Where the edge of each step is. Use tools that help you move around (mobility aids) if they are needed. These include: Canes. Walkers. Scooters. Crutches. Turn on the lights when you go into a dark area. Replace any light bulbs as soon as they burn out. Set up your furniture so you have a clear path. Avoid moving your furniture around. If any of your floors are uneven, fix them. If there are any pets around you, be aware of where they are. Review your medicines with your doctor. Some medicines can make you feel dizzy. This can increase your chance of falling. Ask your doctor what other things that you can do to help prevent falls. This information is not intended to replace advice given to you by your health care provider. Make sure you discuss any questions you have with your health care provider. Document Released: 05/08/2009 Document Revised: 12/18/2015 Document Reviewed: 08/16/2014 Elsevier Interactive Patient Education  2017 [employer].

## 2021-07-16 NOTE — Progress Notes (Addendum)
Subjective:   Ashley Castaneda is a 70 y.o. female who presents for Medicare Annual (Subsequent) preventive examination.  Review of Systems    No ROS      Objective:    Today's Vitals   07/16/21 1431  Weight: 249 lb (112.9 kg)  Height: 5\' 3"  (1.6 m)   Body mass index is 44.11 kg/m.  Advanced Directives 07/16/2021 07/14/2020 05/06/2019 09/03/2017 08/17/2017 10/15/2016  Does Patient Have a Medical Advance Directive? No No No No No No  Would patient like information on creating a medical advance directive? No - Patient declined No - Patient declined No - Patient declined No - Patient declined - -    Current Medications (verified) Outpatient Encounter Medications as of 07/16/2021  Medication Sig   albuterol (PROAIR HFA) 108 (90 Base) MCG/ACT inhaler Inhale 1-2 puffs into the lungs every 6 (six) hours as needed for wheezing or shortness of breath.   ALPRAZolam (XANAX) 0.5 MG tablet Take 1 tablet (0.5 mg total) by mouth 2 (two) times daily.   ascorbic acid (VITAMIN C) 1000 MG tablet Take 1,000 mg by mouth daily.   aspirin 325 MG tablet daily.   azithromycin (ZITHROMAX) 250 MG tablet Take 2 tabs PO x 1 dose, then 1 tab PO QD x 4 days   calcium carbonate (OS-CAL - DOSED IN MG OF ELEMENTAL CALCIUM) 1250 MG tablet Take 1 tablet by mouth daily.   carboxymethylcellulose (REFRESH PLUS) 0.5 % SOLN Take 1 drop by mouth daily as needed. For dry eyes   cetirizine (ZYRTEC) 10 MG tablet Take 10 mg by mouth daily.   colchicine 0.6 MG tablet Take 1 tablet (0.6 mg total) by mouth daily.   cycloSPORINE (RESTASIS) 0.05 % ophthalmic emulsion Place 1 drop into both eyes 2 (two) times daily.   dexamethasone 0.5 MG/5ML elixir Take two teaspoons and swish, gargle, and spit qid prn canker sores.   dronabinol (MARINOL) 5 MG capsule dronabinol 5 mg capsule  TAKE ONE CAPSULE BY MOUTH TWICE A DAY   Eptinezumab-jjmr (VYEPTI IV) Inject into the vein.   erythromycin ophthalmic ointment at bedtime.   fluticasone  (FLONASE) 50 MCG/ACT nasal spray Place 2 sprays into both nostrils daily as needed. For nasal congestion   fluticasone furoate-vilanterol (BREO ELLIPTA) 100-25 MCG/INH AEPB Inhale 1 puff into the lungs daily.   frovatriptan (FROVA) 2.5 MG tablet Take 2.5 mg by mouth as needed. If recurs, may repeat after 2 hours. Max of 3 tabs in 24 hours. For migraines.   Galcanezumab-gnlm 120 MG/ML SOSY Inject into the skin.   ketorolac (ACULAR) 0.4 % SOLN Place 1 drop into the left eye 4 (four) times daily.   lidocaine (LIDODERM) 5 % Place 1 patch onto the skin daily. Remove & Discard patch within 12 hours or as directed by MD   meclizine (ANTIVERT) 25 MG tablet TAKE 1 TABLET BY MOUTH 4 TIMES A DAY AS NEEDED FOR DIZZINESS   meloxicam (MOBIC) 15 MG tablet Take 1 tablet (15 mg total) by mouth daily.   metaxalone (SKELAXIN) 800 MG tablet Take 800 mg by mouth 3 (three) times daily.   montelukast (SINGULAIR) 10 MG tablet TAKE 1 TABLET (10 MG TOTAL) BY MOUTH DAILY AT 6 PM.   Multiple Vitamin (MULTIVITAMIN) tablet Take 1 tablet by mouth every morning.   Omega-3 Fatty Acids (FISH OIL) 1200 MG CAPS Take 1 capsule by mouth every evening.   OPTIVE 0.5-0.9 % ophthalmic solution SMARTSIG:1 Drop(s) In Eye(s) PRN   potassium chloride (KLOR-CON)  10 MEQ tablet Take two tablets once daily   RABEprazole (ACIPHEX) 20 MG tablet Take 1 tablet (20 mg total) by mouth daily.   Rimegepant Sulfate (NURTEC) 75 MG TBDP Take by mouth.   rosuvastatin (CRESTOR) 10 MG tablet Take 1 tablet (10 mg total) by mouth daily.   silodosin (RAPAFLO) 8 MG CAPS capsule Take 1 capsule (8 mg total) by mouth daily with breakfast.   sucralfate (CARAFATE) 1 GM/10ML suspension Take 10 mLs (1 g total) by mouth 3 (three) times daily with meals.   tiZANidine (ZANAFLEX) 2 MG tablet Take 2 mg by mouth at bedtime.   torsemide (DEMADEX) 10 MG tablet Take 1 tablet (10 mg total) by mouth daily.   vitamin E 400 UNIT capsule Take 400 Units by mouth daily at 6 PM.   No  facility-administered encounter medications on file as of 07/16/2021.    Allergies (verified) Cefazolin, Baclofen, Carbamazepine, Duloxetine, Fentanyl, Gabapentin, Naproxen, Oxcarbazepine, Pregabalin, Serotonin reuptake inhibitors (ssris), Topiramate, Venlafaxine, Zonisamide, Ceclor [cefaclor], Milnacipran, and Ondansetron   History: Past Medical History:  Diagnosis Date   Adenomatous colon polyp 09/2008   ALLERGIC RHINITIS 07/01/2008   ANXIETY 07/01/2008   BACK PAIN, THORACIC REGION 03/04/2010   BRONCHITIS, CHRONIC 07/01/2008   CARPAL TUNNEL SYNDROME, HX OF 07/01/2008   CHRONIC OBSTRUCTIVE PULMONARY DISEASE, ACUTE EXACERBATION 08/07/2010   DEPRESSION 07/01/2008   DVT, HX OF 07/01/2008   ECZEMA 05/29/2009   FACIAL PAIN 12/25/2009   FIBROMYALGIA 07/01/2008   GERD 07/01/2008   Hiatal hernia    Hyperlipidemia    LEG PAIN, BILATERAL 07/01/2008   MIGRAINES, HX OF 07/01/2008   OCCIPITAL NEURALGIA 07/01/2008   OSTEOARTHRITIS 07/01/2008   Other specified trigeminal nerve disorders 07/01/2008   REFLEX SYMPATHETIC DYSTROPHY 07/01/2008   Trigeminal neuralgia 05/21/2009   Unspecified vitamin D deficiency 03/31/2010   Past Surgical History:  Procedure Laterality Date   CARPAL TUNNEL RELEASE  07/27/2007   RIGHT   EYE SURGERY     HEAD CRANIECTOMY  07/26/1992   MICROVASULAR DECOMPRESSION   LAVH     BSO   LEFT KNEE SURGERY  07/27/1987   OCCIPITAL NERVE STIMULATOR INSERTION     RIGHT FOOT BONE SPUR  07/26/1985   RIGHT FOOT SURGERY  07/26/1996   BAD CUT   RIGHT JAW SURGERY  07/26/2000   RIGHT KNEE SURGERY  07/26/1988   RIGHT SOULDER SURGERY  1992, 1993, 1995, 1999   RIGHT THUMB SUGERY  07/26/2002   tear duct surgery     TONSILLECTOMY  07/27/1955   Family History  Problem Relation Age of Onset   Ovarian cancer Mother 24   Hypertension Father    Prostate cancer Father    Heart attack Father    Heart attack Maternal Grandmother    Stroke Paternal Grandmother    Social History   Socioeconomic  History   Marital status: Married    Spouse name: [name]   Number of children: 0   Years of education: college-3   Highest education level: Not on file  Occupational History   Occupation: disabled  Tobacco Use   Smoking status: Former    Packs/day: 2.00    Years: 30.00    Pack years: 60.00    Types: Cigarettes    Start date: 1987    Quit date: 02/04/2012    Years since quitting: 9.4   Smokeless tobacco: Never   Tobacco comments:    nicotine in vapor and is very low nicotine   Vaping Use   Vaping Use:  Every day  Substance and Sexual Activity   Alcohol use: No    Alcohol/week: 0.0 standard drinks    Comment: quit 2000   Drug use: No   Sexual activity: Never  Other Topics Concern   Not on file  Social History Narrative   Patient lives at home with husband [name].    Patient has no children and 2 step children.    Patient is on disability since 77 for arthalgia.    Patient has 3 years of college.    Patient is right handed.    Social Determinants of Health   Financial Resource Strain: Low Risk    Difficulty of Paying Living Expenses: Not hard at all  Food Insecurity: No Food Insecurity   Worried About Charity fundraiser in the Last Year: Never true   Sims in the Last Year: Never true  Transportation Needs: No Transportation Needs   Lack of Transportation (Medical): No   Lack of Transportation (Non-Medical): No  Physical Activity: Inactive   Days of Exercise per Week: 0 days   Minutes of Exercise per Session: 0 min  Stress: No Stress Concern Present   Feeling of Stress : Not at all  Social Connections: Moderately Integrated   Frequency of Communication with Friends and Family: More than three times a week   Frequency of Social Gatherings with Friends and Family: More than three times a week   Attends Religious Services: Never   Marine scientist or Organizations: Yes   Attends Archivist Meetings: Never   Marital Status: Married      Clinical Intake:  Pre-visit preparation completed: Yes  Diabetic? No  Interpreter Needed?: No  Activities of Daily Living In your present state of health, do you have any difficulty performing the following activities: 07/16/2021  Hearing? N  Vision? N  Difficulty concentrating or making decisions? N  Walking or climbing stairs? N  Dressing or bathing? N  Doing errands, shopping? N  Preparing Food and eating ? N  Using the Toilet? N  In the past six months, have you accidently leaked urine? Y  Comment Patient has bladder spams on occasions  Do you have problems with loss of bowel control? N  Managing your Medications? N  Managing your Finances? N  Housekeeping or managing your Housekeeping? N  Some recent data might be hidden    Patient Care Team: Eulas Post, MD as PCP - General Jettie Booze, MD as PCP - Cardiology (Cardiology) Renaldo Reel, MD as Consulting Physician (Neurology)  Indicate any recent Medical Services you may have received from other than [facility] providers in the past year (date may be approximate).     Assessment:   This is a routine wellness examination for Kaileigh.  Virtual Visit via Telephone Note  I connected with  ALIZEH MADRIL on 07/16/21 at  2:30 PM EST by telephone and verified that I am speaking with the correct person using two identifiers.  Location: Patient: Home Provider: Office Persons participating in the virtual visit: patient/Nurse Health Advisor   I discussed the limitations, risks, security and privacy concerns of performing an evaluation and management service by telephone and the availability of in person appointments. The patient expressed understanding and agreed to proceed.  Interactive audio and video telecommunications were attempted between this nurse and patient, however failed, due to patient having technical difficulties OR patient did not have access to video capability.  We continued and completed  visit with audio only.  Some vital signs may be absent or patient reported.   Criselda Peaches, LPN  .  Hearing/Vision screen Hearing Screening - Comments:: Patient 100% hearing loss in left ear. Followed by Dr Lucia Gaskins Vision Screening - Comments:: Wears glasses. Followed by [facility]  Dietary issues and exercise activities discussed: Current Exercise Habits: The patient does not participate in regular exercise at present   Goals Addressed             This Visit's Progress    Weight (lb) < 200 lb (90.7 kg)   249 lb (112.9 kg)    Will continue your weight loss program.         Depression Screen PHQ 2/9 Scores 07/16/2021 07/14/2020 10/15/2016 06/14/2013  PHQ - 2 Score 0 0 0 0  PHQ- 9 Score - 0 - -    Fall Risk Fall Risk  07/16/2021 07/14/2020 06/15/2019 06/14/2018 10/15/2016  Falls in the past year? 0 1 0 0 No  Comment - - Emmi Telephone Survey: data to providers prior to load [employer] Telephone Survey: data to providers prior to load -  Number falls in past yr: 0 0 - - -  Injury with Fall? 0 1 - - -  Risk for fall due to : - History of fall(s);Impaired balance/gait - - -  Follow up - Falls evaluation completed;Falls prevention discussed - - -    FALL RISK PREVENTION PERTAINING TO THE HOME:  Any stairs in or around the home? Yes  If so, are there any without handrails? No  Home free of loose throw rugs in walkways, pet beds, electrical cords, etc? Yes  Adequate lighting in your home to reduce risk of falls? Yes   ASSISTIVE DEVICES UTILIZED TO PREVENT FALLS:  Life alert? No  Use of a cane, walker or w/c? Yes  Grab bars in the bathroom? Yes  Shower chair or bench in shower? Yes  Elevated toilet seat or a handicapped toilet? No   TIMED UP AND GO:  Was the test performed? No . Audio Visit  Cognitive Function: MMSE - Mini Mental State Exam 10/15/2016  Not completed: (No Data)   6CIT Screen 07/16/2021  What Year? 0 points  What month? 0 points  What time? 0  points  Count back from 20 0 points  Months in reverse 0 points  Repeat phrase 0 points  Total Score 0   Immunizations Immunization History  Administered Date(s) Administered   Fluad Quad(high Dose 65+) 04/09/2019   Influenza Split 04/30/2011, 05/04/2012   Influenza Whole 06/10/2008, 03/31/2010   Influenza, High Dose Seasonal PF 06/19/2018   Influenza, Seasonal, Injecte, Preservative Fre 09/13/2015   Influenza,inj,Quad PF,6+ Mos 06/14/2013, 03/28/2014   Influenza,inj,quad, With Preservative 06/19/2018   Influenza-Unspecified 07/26/2016, 05/04/2017, 05/26/2020, 05/25/2021   Moderna Sars-Covid-2 Vaccination 09/27/2019, 11/02/2019, 06/27/2020   Pneumococcal Conjugate-13 06/19/2018   Pneumococcal Polysaccharide-23 06/10/2008, 07/21/2020   Td 03/31/2010   Zoster Recombinat (Shingrix) 12/18/2019    TDAP status: Due, Education has been provided regarding the importance of this vaccine. Advised may receive this vaccine at local pharmacy or Health Dept. Aware to provide a copy of the vaccination record if obtained from local pharmacy or Health Dept. Verbalized acceptance and understanding.  Flu Vaccine status: Up to date  Pneumococcal vaccine status: Up to date  Covid-19 vaccine status: Completed vaccines  Qualifies for Shingles Vaccine? Yes   Zostavax completed No   Shingrix Completed?: No.    Education has been provided  regarding the importance of this vaccine. Patient has been advised to call insurance company to determine out of pocket expense if they have not yet received this vaccine. Advised may also receive vaccine at local pharmacy or Health Dept. Verbalized acceptance and understanding.  Screening Tests Health Maintenance  Topic Date Due   Hepatitis C Screening  Never done   Zoster Vaccines- Shingrix (2 of 2) 10/14/2021 (Originally 02/12/2020)   TETANUS/TDAP  07/16/2022 (Originally 03/31/2020)   MAMMOGRAM  04/13/2022   COLONOSCOPY (Pts 45-24yrs Insurance coverage will need to  be confirmed)  02/05/2025   Pneumonia Vaccine 73+ Years old  Completed   INFLUENZA VACCINE  Completed   DEXA SCAN  Completed   COVID-19 Vaccine  Completed   HPV VACCINES  Aged Out    Health Maintenance  Health Maintenance Due  Topic Date Due   Hepatitis C Screening  Never done   Additional Screening:   Vision Screening: Recommended annual ophthalmology exams for early detection of glaucoma and other disorders of the eye. Is the patient up to date with their annual eye exam?  Yes  Who is the provider or what is the name of the office in which the patient attends annual eye exams? Followed by [place] Screening: Recommended annual dental exams for proper oral hygiene  Community Resource Referral / Chronic Care Management:  CRR required this visit?  No   CCM required this visit?  No      Plan:     I have personally reviewed and noted the following in the patients chart:   Medical and social history Use of alcohol, tobacco or illicit drugs  Current medications and supplements including opioid prescriptions. Patient currently not taking opioids Functional ability and status Nutritional status Physical activity Advanced directives List of other physicians Hospitalizations, surgeries, and ER visits in previous 12 months Vitals Screenings to include cognitive, depression, and falls Referrals and appointments  In addition, I have reviewed and discussed with patient certain preventive protocols, quality metrics, and best practice recommendations. A written personalized care plan for preventive services as well as general preventive health recommendations were provided to patient.     Criselda Peaches, LPN   I have reviewed the documentation for the AWV and Advanced Care Planning provided by the health coach and agree with their documentation. I was immediately available for any questions  Eulas Post MD [facility]

## 2021-07-16 NOTE — Telephone Encounter (Signed)
Last filled 01/19/2021 Last OV 02/27/2021  Ok to fill?

## 2021-07-17 MED ORDER — DEXAMETHASONE 0.5 MG/5ML PO ELIX: ORAL_SOLUTION | ORAL | 2 refills | Status: DC

## 2021-07-17 NOTE — Telephone Encounter (Signed)
ATC, unable to leave a message.  

## 2021-07-17 NOTE — Telephone Encounter (Signed)
Dexamethasone sent in.  Please advise. On the xanax.

## 2021-07-21 NOTE — Telephone Encounter (Signed)
ATC, unable to reach the patient.

## 2021-07-24 NOTE — Telephone Encounter (Signed)
ATC, unable to leave a message.  Unable to reach the patient. Message will be closed.

## 2021-08-10 DIAGNOSIS — M47812 Spondylosis without myelopathy or radiculopathy, cervical region: Secondary | ICD-10-CM | POA: Diagnosis not present

## 2021-08-21 DIAGNOSIS — H6692 Otitis media, unspecified, left ear: Secondary | ICD-10-CM | POA: Diagnosis not present

## 2021-08-21 DIAGNOSIS — K1120 Sialoadenitis, unspecified: Secondary | ICD-10-CM | POA: Diagnosis not present

## 2021-08-27 DIAGNOSIS — M47812 Spondylosis without myelopathy or radiculopathy, cervical region: Secondary | ICD-10-CM | POA: Diagnosis not present

## 2021-08-28 ENCOUNTER — Ambulatory Visit (INDEPENDENT_AMBULATORY_CARE_PROVIDER_SITE_OTHER): Payer: Medicare Other | Admitting: Family Medicine

## 2021-08-28 VITALS — BP 144/80 | HR 93 | Temp 97.6°F | Wt 246.6 lb

## 2021-08-28 DIAGNOSIS — K120 Recurrent oral aphthae: Secondary | ICD-10-CM | POA: Diagnosis not present

## 2021-08-28 DIAGNOSIS — E559 Vitamin D deficiency, unspecified: Secondary | ICD-10-CM

## 2021-08-28 DIAGNOSIS — F419 Anxiety disorder, unspecified: Secondary | ICD-10-CM

## 2021-08-28 DIAGNOSIS — R600 Localized edema: Secondary | ICD-10-CM

## 2021-08-28 DIAGNOSIS — M791 Myalgia, unspecified site: Secondary | ICD-10-CM

## 2021-08-28 DIAGNOSIS — J3089 Other allergic rhinitis: Secondary | ICD-10-CM

## 2021-08-28 LAB — COMPREHENSIVE METABOLIC PANEL
ALT: 26 U/L (ref 0–35)
AST: 18 U/L (ref 0–37)
Albumin: 4.8 g/dL (ref 3.5–5.2)
Alkaline Phosphatase: 82 U/L (ref 39–117)
BUN: 22 mg/dL (ref 6–23)
CO2: 29 mEq/L (ref 19–32)
Calcium: 9.6 mg/dL (ref 8.4–10.5)
Chloride: 99 mEq/L (ref 96–112)
Creatinine, Ser: 1.17 mg/dL (ref 0.40–1.20)
GFR: 47.56 mL/min — ABNORMAL LOW (ref >60.00)
Glucose, Bld: 99 mg/dL (ref 70–99)
Potassium: 4.6 mEq/L (ref 3.5–5.1)
Sodium: 136 mEq/L (ref 135–145)
Total Bilirubin: 0.4 mg/dL (ref 0.2–1.2)
Total Protein: 7.6 g/dL (ref 6.0–8.3)

## 2021-08-28 LAB — CBC WITH DIFFERENTIAL/PLATELET
Basophils Absolute: 0 10*3/uL (ref 0.0–0.1)
Basophils Relative: 0.4 % (ref 0.0–3.0)
Eosinophils Absolute: 0 10*3/uL (ref 0.0–0.7)
Eosinophils Relative: 0.1 % (ref 0.0–5.0)
HCT: 38.5 % (ref 36.0–46.0)
Hemoglobin: 12.6 g/dL (ref 12.0–15.0)
Lymphocytes Relative: 9.8 % — ABNORMAL LOW (ref 12.0–46.0)
Lymphs Abs: 1.2 10*3/uL (ref 0.7–4.0)
MCHC: 32.7 g/dL (ref 30.0–36.0)
MCV: 92.2 fl (ref 78.0–100.0)
Monocytes Absolute: 1 10*3/uL (ref 0.1–1.0)
Monocytes Relative: 8.1 % (ref 3.0–12.0)
Neutro Abs: 10 10*3/uL — ABNORMAL HIGH (ref 1.4–7.7)
Neutrophils Relative %: 81.6 % — ABNORMAL HIGH (ref 43.0–77.0)
Platelets: 377 10*3/uL (ref 150.0–400.0)
RBC: 4.17 Mil/uL (ref 3.87–5.11)
RDW: 12.2 % (ref 11.5–15.5)
WBC: 12.3 10*3/uL — ABNORMAL HIGH (ref 4.0–10.5)

## 2021-08-28 LAB — VITAMIN D 25 HYDROXY (VIT D DEFICIENCY, FRACTURES): VITD: 37.99 ng/mL (ref 30.00–100.00)

## 2021-08-28 MED ORDER — ALBUTEROL SULFATE HFA 108 (90 BASE) MCG/ACT IN AERS: 1.0000 | INHALATION_SPRAY | Freq: Four times a day (QID) | RESPIRATORY_TRACT | 1 refills | Status: DC | PRN

## 2021-08-28 MED ORDER — FLUTICASONE PROPIONATE 50 MCG/ACT NA SUSP: 2.0000 | Freq: Every day | NASAL | 3 refills | Status: AC | PRN

## 2021-08-28 MED ORDER — MELOXICAM 15 MG PO TABS: 15.0000 mg | ORAL_TABLET | Freq: Every day | ORAL | 3 refills | Status: DC

## 2021-08-28 MED ORDER — ALPRAZOLAM 0.5 MG PO TABS: 0.5000 mg | ORAL_TABLET | Freq: Two times a day (BID) | ORAL | 1 refills | Status: DC

## 2021-08-28 MED ORDER — TORSEMIDE 10 MG PO TABS: 10.0000 mg | ORAL_TABLET | Freq: Every day | ORAL | 3 refills | Status: DC

## 2021-08-28 MED ORDER — MONTELUKAST SODIUM 10 MG PO TABS: ORAL_TABLET | ORAL | 3 refills | Status: DC

## 2021-08-28 NOTE — Progress Notes (Signed)
Established Patient Office Visit  Subjective:  Patient ID: Ashley Castaneda, female    DOB: 04-12-1951  Age: 71 y.o. MRN: [id number]  CC:  Chief Complaint  Patient presents with   Medication Refill    Refill medications.     HPI CALEESI KOHL presents for medical follow-up.  She is followed by multiple specialists including headache specialists over in [facility] and [facility] laboratory technician at [facility].  She has been diagnosed with small fiber neuropathy.  She has very complicated past history with history of migraine headaches, past history of DVT, recurrent aphthous ulcers, GERD, trigeminal neuralgia, fibromyalgia, obesity, chronic bilateral lower extremity edema, chronic insomnia, history of depression, and chronic anxiety.  She remains on torsemide 10 mg daily for edema issues.  Her edema has been fairly well controlled.  Still has some dependent edema late in the day.  No orthopnea.  Needs follow-up labs.  She states she has had low vitamin D in the past.  Inconsistent with replacement.  Requesting follow-up levels.  She is requesting several refills for medication today including Singulair, albuterol, Flonase, alprazolam, meloxicam, and torsemide.  Regarding her aphthous ulcers she has some temporary mild improvement with topical steroids.  We have tried colchicine but she did not see any improvement.  She wonders if some of this may be allergic based.  She has not seen an allergist in some time but would like referral to allergist.  She has perennial allergies and uses Singulair and has used antihistamines in the past.  She has osteoarthritis involving multiple joints.  Uses meloxicam for that.  She gets her medications through [facility].  Past Medical History:  Diagnosis Date   Adenomatous colon polyp 09/2008   ALLERGIC RHINITIS 07/01/2008   ANXIETY 07/01/2008   BACK PAIN, THORACIC REGION 03/04/2010   BRONCHITIS, CHRONIC 07/01/2008   CARPAL TUNNEL SYNDROME, HX OF 07/01/2008   CHRONIC  OBSTRUCTIVE PULMONARY DISEASE, ACUTE EXACERBATION 08/07/2010   DEPRESSION 07/01/2008   DVT, HX OF 07/01/2008   ECZEMA 05/29/2009   FACIAL PAIN 12/25/2009   FIBROMYALGIA 07/01/2008   GERD 07/01/2008   Hiatal hernia    Hyperlipidemia    LEG PAIN, BILATERAL 07/01/2008   MIGRAINES, HX OF 07/01/2008   OCCIPITAL NEURALGIA 07/01/2008   OSTEOARTHRITIS 07/01/2008   Other specified trigeminal nerve disorders 07/01/2008   REFLEX SYMPATHETIC DYSTROPHY 07/01/2008   Trigeminal neuralgia 05/21/2009   Unspecified vitamin D deficiency 03/31/2010    Past Surgical History:  Procedure Laterality Date   CARPAL TUNNEL RELEASE  07/27/2007   RIGHT   EYE SURGERY     HEAD CRANIECTOMY  07/26/1992   MICROVASULAR DECOMPRESSION   LAVH     BSO   LEFT KNEE SURGERY  07/27/1987   OCCIPITAL NERVE STIMULATOR INSERTION     RIGHT FOOT BONE SPUR  07/26/1985   RIGHT FOOT SURGERY  07/26/1996   BAD CUT   RIGHT JAW SURGERY  07/26/2000   RIGHT KNEE SURGERY  07/26/1988   RIGHT SOULDER SURGERY  1992, 1993, 1995, 1999   RIGHT THUMB SUGERY  07/26/2002   tear duct surgery     TONSILLECTOMY  07/27/1955    Family History  Problem Relation Age of Onset   Ovarian cancer Mother 26   Hypertension Father    Prostate cancer Father    Heart attack Father    Heart attack Maternal Grandmother    Stroke Paternal Grandmother     Social History   Socioeconomic History   Marital status: Married  Spouse name: [name]   Number of children: 0   Years of education: college-3   Highest education level: Some college, no degree  Occupational History   Occupation: disabled  Tobacco Use   Smoking status: Former    Packs/day: 2.00    Years: 30.00    Pack years: 60.00    Types: Cigarettes    Start date: 6    Quit date: 02/04/2012    Years since quitting: 9.5   Smokeless tobacco: Never   Tobacco comments:    nicotine in vapor and is very low nicotine   Vaping Use   Vaping Use: Every day  Substance and Sexual Activity   Alcohol  use: No    Alcohol/week: 0.0 standard drinks    Comment: quit 2000   Drug use: No   Sexual activity: Never  Other Topics Concern   Not on file  Social History Narrative   Patient lives at home with husband [name].    Patient has no children and 2 step children.    Patient is on disability since 8 for arthalgia.    Patient has 3 years of college.    Patient is right handed.    Social Determinants of Health   Financial Resource Strain: Low Risk    Difficulty of Paying Living Expenses: Not hard at all  Food Insecurity: No Food Insecurity   Worried About Charity fundraiser in the Last Year: Never true   [place] in the Last Year: Never true  Transportation Needs: No Transportation Needs   Lack of Transportation (Medical): No   Lack of Transportation (Non-Medical): No  Physical Activity: Inactive   Days of Exercise per Week: 0 days   Minutes of Exercise per Session: 0 min  Stress: Stress Concern Present   Feeling of Stress : To some extent  Social Connections: Moderately Integrated   Frequency of Communication with Friends and Family: More than three times a week   Frequency of Social Gatherings with Friends and Family: Once a week   Attends Religious Services: 1 to 4 times per year   Active Member of [employer] or Organizations: No   Attends Music therapist: Never   Marital Status: Married  Human resources officer Violence: Not At Risk   Fear of Current or Ex-Partner: No   Emotionally Abused: No   Physically Abused: No   Sexually Abused: No    Outpatient Medications Prior to Visit  Medication Sig Dispense Refill   albuterol (PROAIR HFA) 108 (90 Base) MCG/ACT inhaler Inhale 1-2 puffs into the lungs every 6 (six) hours as needed for wheezing or shortness of breath. 8 g 1   ALPRAZolam (XANAX) 0.5 MG tablet Take 1 tablet (0.5 mg total) by mouth 2 (two) times daily. 180 tablet 1   aspirin 325 MG tablet daily.     calcium carbonate (OS-CAL - DOSED IN MG OF ELEMENTAL  CALCIUM) 1250 MG tablet Take 1 tablet by mouth daily.     carboxymethylcellulose (REFRESH PLUS) 0.5 % SOLN Take 1 drop by mouth daily as needed. For dry eyes     cetirizine (ZYRTEC) 10 MG tablet Take 10 mg by mouth daily.     cycloSPORINE (RESTASIS) 0.05 % ophthalmic emulsion Place 1 drop into both eyes 2 (two) times daily.     dexamethasone 0.5 MG/5ML elixir Take two teaspoons and swish, gargle, and spit qid prn canker sores. 237 mL 2   dronabinol (MARINOL) 5 MG capsule dronabinol 5 mg capsule  TAKE ONE CAPSULE BY MOUTH TWICE A DAY     Eptinezumab-jjmr (VYEPTI IV) Inject into the vein.     fluticasone (FLONASE) 50 MCG/ACT nasal spray Place 2 sprays into both nostrils daily as needed. For nasal congestion 16 g 3   fluticasone furoate-vilanterol (BREO ELLIPTA) 100-25 MCG/INH AEPB Inhale 1 puff into the lungs daily. 3 each 3   frovatriptan (FROVA) 2.5 MG tablet Take 2.5 mg by mouth as needed. If recurs, may repeat after 2 hours. Max of 3 tabs in 24 hours. For migraines.     lidocaine (LIDODERM) 5 % Place 1 patch onto the skin daily. Remove & Discard patch within 12 hours or as directed by MD 30 patch 1   meclizine (ANTIVERT) 25 MG tablet TAKE 1 TABLET BY MOUTH 4 TIMES A DAY AS NEEDED FOR DIZZINESS 120 tablet 1   meloxicam (MOBIC) 15 MG tablet Take 1 tablet (15 mg total) by mouth daily. 90 tablet 3   metaxalone (SKELAXIN) 800 MG tablet Take 800 mg by mouth 3 (three) times daily.     montelukast (SINGULAIR) 10 MG tablet TAKE 1 TABLET (10 MG TOTAL) BY MOUTH DAILY AT 6 PM. 90 tablet 3   Multiple Vitamin (MULTIVITAMIN) tablet Take 1 tablet by mouth every morning.     Omega-3 Fatty Acids (FISH OIL) 1200 MG CAPS Take 1 capsule by mouth every evening.     potassium chloride (KLOR-CON) 10 MEQ tablet Take two tablets once daily 90 tablet 3   RABEprazole (ACIPHEX) 20 MG tablet Take 1 tablet (20 mg total) by mouth daily. 90 tablet 3   Rimegepant Sulfate (NURTEC) 75 MG TBDP Take by mouth.     silodosin  (RAPAFLO) 8 MG CAPS capsule Take 1 capsule (8 mg total) by mouth daily with breakfast. 90 capsule 0   tiZANidine (ZANAFLEX) 2 MG tablet Take 2 mg by mouth at bedtime.     torsemide (DEMADEX) 10 MG tablet Take 1 tablet (10 mg total) by mouth daily. 90 tablet 3   vitamin E 400 UNIT capsule Take 400 Units by mouth daily at 6 PM.     colchicine 0.6 MG tablet Take 1 tablet (0.6 mg total) by mouth daily. 90 tablet 3   ascorbic acid (VITAMIN C) 1000 MG tablet Take 1,000 mg by mouth daily.     azithromycin (ZITHROMAX) 250 MG tablet Take 2 tabs PO x 1 dose, then 1 tab PO QD x 4 days 6 tablet 0   erythromycin ophthalmic ointment at bedtime.     Galcanezumab-gnlm 120 MG/ML SOSY Inject into the skin.     ketorolac (ACULAR) 0.4 % SOLN Place 1 drop into the left eye 4 (four) times daily.     OPTIVE 0.5-0.9 % ophthalmic solution SMARTSIG:1 Drop(s) In Eye(s) PRN     rosuvastatin (CRESTOR) 10 MG tablet Take 1 tablet (10 mg total) by mouth daily. 90 tablet 3   sucralfate (CARAFATE) 1 GM/10ML suspension Take 10 mLs (1 g total) by mouth 3 (three) times daily with meals. 414 mL 0   No facility-administered medications prior to visit.    Allergies  Allergen Reactions   Cefazolin Anaphylaxis, Swelling and Other (See Comments)    Ancef - in surgery, swelled up, turned blue, heart problems Turns Blue   Baclofen Other (See Comments)    REACTION: confusion REACTION: confusion REACTION: confusion REACTION: confusion   Carbamazepine Other (See Comments)    REACTION: confusion Other reaction(s): Confusion Confusion,Bad reactions to all seizure medicine   Duloxetine  Other (See Comments)   Fentanyl Nausea And Vomiting and Other (See Comments)    REACTION: nausea and vomiting   Gabapentin Other (See Comments)    REACTION: confusion   Naproxen Other (See Comments)    Elevates blood pressure   Oxcarbazepine Other (See Comments)    REACTION: confusion Other reaction(s): Other REACTION: confusion REACTION:  confusion REACTION: confusion    Pregabalin Other (See Comments)    Other reaction(s): Other   Serotonin Reuptake Inhibitors (Ssris) Other (See Comments)   Topiramate Other (See Comments)    Other reaction(s): Unknown   Venlafaxine Other (See Comments)    Other reaction(s): Unknown   Zonisamide Other (See Comments)    Other reaction(s): Other   Ceclor [Cefaclor]     hives   Milnacipran Other (See Comments)   Ondansetron Nausea And Vomiting    Nausea and vomiting after IV administration    ROS Review of Systems  Constitutional:  Positive for fatigue. Negative for chills and fever.  HENT:  Positive for congestion. Negative for trouble swallowing.   Respiratory:  Negative for shortness of breath.   Cardiovascular:  Positive for leg swelling. Negative for chest pain.  Gastrointestinal:  Negative for abdominal pain.  Musculoskeletal:  Positive for arthralgias, back pain, myalgias and neck pain.     Objective:    Physical Exam Vitals reviewed.  Constitutional:      Appearance: Normal appearance.  HENT:     Mouth/Throat:     Comments: She has a couple of small healing aphthous ulcers including 1 on tip of the tongue and 1 right lower inner lip.  No atypical features. Cardiovascular:     Rate and Rhythm: Normal rate and regular rhythm.  Pulmonary:     Effort: Pulmonary effort is normal.     Breath sounds: Normal breath sounds.  Musculoskeletal:     Right lower leg: No edema.     Left lower leg: No edema.  Neurological:     General: No focal deficit present.     Mental Status: She is alert.    BP (!) 144/80 (BP Location: Left Arm, Patient Position: Sitting, Cuff Size: Normal)    Pulse 93    Temp 97.6 F (36.4 C) (Oral)    Wt 246 lb 9.6 oz (111.9 kg)    LMP 07/26/1992    SpO2 98%    BMI 43.68 kg/m  Wt Readings from Last 3 Encounters:  08/28/21 246 lb 9.6 oz (111.9 kg)  07/16/21 249 lb (112.9 kg)  02/27/21 249 lb (112.9 kg)     Health Maintenance Due  Topic Date Due    URINE MICROALBUMIN  Never done   Hepatitis C Screening  Never done    There are no preventive care reminders to display for this patient.  Lab Results  Component Value Date   TSH 2.33 10/18/2016   Lab Results  Component Value Date   WBC 7.7 05/06/2019   HGB 13.4 05/06/2019   HCT 41.9 05/06/2019   MCV 97.0 05/06/2019   PLT 320 05/06/2019   Lab Results  Component Value Date   NA 137 07/21/2020   K 4.5 07/21/2020   CO2 29 07/21/2020   GLUCOSE 94 07/21/2020   BUN 18 07/21/2020   CREATININE 1.01 07/21/2020   BILITOT 0.4 07/21/2020   ALKPHOS 69 07/21/2020   AST 18 07/21/2020   ALT 22 07/21/2020   PROT 6.8 07/21/2020   ALBUMIN 4.5 07/21/2020   CALCIUM 9.3 07/21/2020   ANIONGAP 12 05/06/2019  GFR 57.18 (L) 07/21/2020   Lab Results  Component Value Date   CHOL 235 (H) 07/21/2020   Lab Results  Component Value Date   HDL 59.50 07/21/2020   Lab Results  Component Value Date   LDLCALC 144 (H) 07/21/2020   Lab Results  Component Value Date   TRIG 159.0 (H) 07/21/2020   Lab Results  Component Value Date   CHOLHDL 4 07/21/2020   No results found for: HGBA1C    Assessment & Plan:   Problem List Items Addressed This Visit       Unprioritized   Bilateral leg edema   Relevant Orders   CBC with Differential/Platelet   CMP   Allergic rhinitis   Other Visit Diagnoses     Myalgia    -  Primary   Anxiety       Vitamin D deficiency       Relevant Orders   VITAMIN D 25 Hydroxy (Vit-D Deficiency, Fractures)   Perennial allergic rhinitis       Relevant Orders   Ambulatory referral to Allergy     Patient has multiple chronic medical problems as above.  She has been diagnosed with small fiber neuropathy followed at [place] labs with CBC and CMP along with vitamin D level  -Patient requesting referral to allergist to consider further allergy testing.  -History of recurrent aphthous ulcers.  Has not responded to colchicine.  She has gotten  some benefit from topical steroids.     she would like to proceed with allergy testing  -Over 45 minutes were spent reviewing her history, reviewing medications, examining, discussing further laboratory evaluation, doing medication refills, placing referrals, and documentation  Follow-up: No follow-ups on file.    Carolann Littler, MD

## 2021-08-31 ENCOUNTER — Encounter: Payer: Self-pay | Admitting: Family Medicine

## 2021-08-31 DIAGNOSIS — K1379 Other lesions of oral mucosa: Secondary | ICD-10-CM | POA: Diagnosis not present

## 2021-08-31 DIAGNOSIS — R682 Dry mouth, unspecified: Secondary | ICD-10-CM | POA: Diagnosis not present

## 2021-08-31 MED ORDER — ALPRAZOLAM 0.5 MG PO TABS: 0.5000 mg | ORAL_TABLET | Freq: Two times a day (BID) | ORAL | 0 refills | Status: DC

## 2021-08-31 NOTE — Telephone Encounter (Signed)
I sent in 2-week supply of alprazolam to local [facility]

## 2021-09-01 DIAGNOSIS — U071 COVID-19: Secondary | ICD-10-CM | POA: Diagnosis not present

## 2021-09-09 DIAGNOSIS — R682 Dry mouth, unspecified: Secondary | ICD-10-CM | POA: Diagnosis not present

## 2021-09-17 DIAGNOSIS — G43719 Chronic migraine without aura, intractable, without status migrainosus: Secondary | ICD-10-CM | POA: Diagnosis not present

## 2021-09-18 ENCOUNTER — Emergency Department (HOSPITAL_BASED_OUTPATIENT_CLINIC_OR_DEPARTMENT_OTHER)
Admission: EM | Admit: 2021-09-18 | Discharge: 2021-09-18 | Disposition: A | Discharge status: Home / Self Care | Payer: Medicare Other | Attending: Emergency Medicine | Admitting: Emergency Medicine

## 2021-09-18 ENCOUNTER — Other Ambulatory Visit: Payer: Self-pay

## 2021-09-18 ENCOUNTER — Encounter (HOSPITAL_BASED_OUTPATIENT_CLINIC_OR_DEPARTMENT_OTHER): Payer: Self-pay

## 2021-09-18 ENCOUNTER — Emergency Department (HOSPITAL_BASED_OUTPATIENT_CLINIC_OR_DEPARTMENT_OTHER): Payer: Medicare Other

## 2021-09-18 DIAGNOSIS — Z7982 Long term (current) use of aspirin: Secondary | ICD-10-CM | POA: Insufficient documentation

## 2021-09-18 DIAGNOSIS — J449 Chronic obstructive pulmonary disease, unspecified: Secondary | ICD-10-CM | POA: Insufficient documentation

## 2021-09-18 DIAGNOSIS — M79604 Pain in right leg: Secondary | ICD-10-CM | POA: Diagnosis not present

## 2021-09-18 DIAGNOSIS — M79661 Pain in right lower leg: Secondary | ICD-10-CM | POA: Insufficient documentation

## 2021-09-18 DIAGNOSIS — M25561 Pain in right knee: Secondary | ICD-10-CM | POA: Diagnosis not present

## 2021-09-18 DIAGNOSIS — M79606 Pain in leg, unspecified: Secondary | ICD-10-CM

## 2021-09-18 LAB — BASIC METABOLIC PANEL
Anion gap: 9 (ref 5–15)
BUN: 19 mg/dL (ref 8–23)
CO2: 26 mmol/L (ref 22–32)
Calcium: 8.9 mg/dL (ref 8.9–10.3)
Chloride: 102 mmol/L (ref 98–111)
Creatinine, Ser: 0.99 mg/dL (ref 0.44–1.00)
GFR, Estimated: >60 mL/min (ref >60)
Glucose, Bld: 101 mg/dL — ABNORMAL HIGH (ref 70–99)
Potassium: 4 mmol/L (ref 3.5–5.1)
Sodium: 137 mmol/L (ref 135–145)

## 2021-09-18 NOTE — Discharge Instructions (Signed)
Ultrasound was negative for blood clots which is great news.  This is likely muscular in nature.  Would like for you to take 600 mg ibuprofen every 6 hours for pain control.  Please follow-up with your primary care doctor.  Return to the emergency department for worsening symptoms.

## 2021-09-18 NOTE — ED Triage Notes (Signed)
Onset of one week knot and pain behind knee.  States now have pain from calf up to thigh.  Ambulatory with limp.  Hx of blood clots in right leg.

## 2021-09-18 NOTE — ED Provider Notes (Signed)
[name] Provider Note   CSN: [id number] Arrival date & time: 09/18/21  1454     History Chief Complaint  Patient presents with   Leg Pain    Ashley Castaneda is a 71 y.o. female with history of multiple blood clots in the right lower extremity currently not on any anticoagulation only aspirin who presents to the emergency department with a 1 week history of right posterior leg pain primarily behind the knee and calf.  She describes this as a knot and cramping sensation.  No history of trauma or injury to the area.  No chest pain or shortness of breath.  No fever or chills.   Leg Pain     Home Medications Prior to Admission medications   Medication Sig Start Date End Date Taking? Authorizing Provider  albuterol (PROAIR HFA) 108 (90 Base) MCG/ACT inhaler Inhale 1-2 puffs into the lungs every 6 (six) hours as needed for wheezing or shortness of breath. 08/28/21   Burchette, Alinda Sierras, MD  ALPRAZolam Duanne Moron) 0.5 MG tablet Take 1 tablet (0.5 mg total) by mouth 2 (two) times daily. 08/31/21   Burchette, Alinda Sierras, MD  aspirin 325 MG tablet daily.    [provider]  calcium carbonate (OS-CAL - DOSED IN MG OF ELEMENTAL CALCIUM) 1250 MG tablet Take 1 tablet by mouth daily.    [provider]  carboxymethylcellulose (REFRESH PLUS) 0.5 % SOLN Take 1 drop by mouth daily as needed. For dry eyes    [provider]  cetirizine (ZYRTEC) 10 MG tablet Take 10 mg by mouth daily.    [provider]  cycloSPORINE (RESTASIS) 0.05 % ophthalmic emulsion Place 1 drop into both eyes 2 (two) times daily.    [provider]  dexamethasone 0.5 MG/5ML elixir Take two teaspoons and swish, gargle, and spit qid prn canker sores. 07/17/21   Burchette, Alinda Sierras, MD  dronabinol (MARINOL) 5 MG capsule dronabinol 5 mg capsule  TAKE ONE CAPSULE BY MOUTH TWICE A DAY    [provider]  Eptinezumab-jjmr (VYEPTI IV) Inject into the vein.     [provider]  fluticasone (FLONASE) 50 MCG/ACT nasal spray Place 2 sprays into both nostrils daily as needed. For nasal congestion 08/28/21 08/05/23  Burchette, Alinda Sierras, MD  fluticasone furoate-vilanterol (BREO ELLIPTA) 100-25 MCG/INH AEPB Inhale 1 puff into the lungs daily. 07/21/20   Burchette, Alinda Sierras, MD  frovatriptan (FROVA) 2.5 MG tablet Take 2.5 mg by mouth as needed. If recurs, may repeat after 2 hours. Max of 3 tabs in 24 hours. For migraines.    [provider]  lidocaine (LIDODERM) 5 % Place 1 patch onto the skin daily. Remove & Discard patch within 12 hours or as directed by MD 04/12/18   Eulas Post, MD  meclizine (ANTIVERT) 25 MG tablet TAKE 1 TABLET BY MOUTH 4 TIMES A DAY AS NEEDED FOR DIZZINESS 04/17/14   Kathrynn Ducking, MD  meloxicam (MOBIC) 15 MG tablet Take 1 tablet (15 mg total) by mouth daily. 08/28/21   Burchette, Alinda Sierras, MD  metaxalone (SKELAXIN) 800 MG tablet Take 800 mg by mouth 3 (three) times daily.    [provider]  montelukast (SINGULAIR) 10 MG tablet TAKE 1 TABLET (10 MG TOTAL) BY MOUTH DAILY AT 6 PM. 08/28/21   Burchette, Alinda Sierras, MD  Multiple Vitamin (MULTIVITAMIN) tablet Take 1 tablet by mouth every morning.    [provider]  Omega-3 Fatty Acids (FISH OIL) 1200  MG CAPS Take 1 capsule by mouth every evening.    [provider]  potassium chloride (KLOR-CON) 10 MEQ tablet Take two tablets once daily 01/19/21   Burchette, Alinda Sierras, MD  RABEprazole (ACIPHEX) 20 MG tablet Take 1 tablet (20 mg total) by mouth daily. 07/21/20   Burchette, Alinda Sierras, MD  Rimegepant Sulfate (NURTEC) 75 MG TBDP Take by mouth. 10/03/19   [provider]  silodosin (RAPAFLO) 8 MG CAPS capsule Take 1 capsule (8 mg total) by mouth daily with breakfast. 07/22/20   Burchette, Alinda Sierras, MD  tiZANidine (ZANAFLEX) 2 MG tablet Take 2 mg by mouth at bedtime.    [provider]  torsemide (DEMADEX) 10 MG tablet Take 1 tablet (10 mg total) by  mouth daily. 08/28/21   Burchette, Alinda Sierras, MD  vitamin E 400 UNIT capsule Take 400 Units by mouth daily at 6 PM.    [provider]      Allergies    Cefazolin, Baclofen, Carbamazepine, Duloxetine, Fentanyl, Gabapentin, Naproxen, Oxcarbazepine, Pregabalin, Serotonin reuptake inhibitors (ssris), Topiramate, Venlafaxine, Zonisamide, Ceclor [cefaclor], Milnacipran, and Ondansetron    Review of Systems   Review of Systems  All other systems reviewed and are negative.  Physical Exam Updated Vital Signs BP 121/81    Pulse 67    Temp 98.8 F (37.1 C)    Resp 18    Ht 5\' 3"  (1.6 m)    Wt 111.1 kg    LMP 07/26/1992    SpO2 98%    BMI 43.40 kg/m  Physical Exam Vitals and nursing note reviewed.  Constitutional:      Appearance: Normal appearance.  HENT:     Head: Normocephalic and atraumatic.  Eyes:     General:        Right eye: No discharge.        Left eye: No discharge.     Conjunctiva/sclera: Conjunctivae normal.  Pulmonary:     Effort: Pulmonary effort is normal.  Musculoskeletal:     Comments: No significant Tenderness on the right.  2+ dorsalis pedis pulse on the right.  Posterior knee is nontender to palpation.  Right leg is mildly swollen in comparison to the left.  No pitting edema.  No obvious signs of redness.  Skin:    General: Skin is warm and dry.     Findings: No rash.  Neurological:     General: No focal deficit present.     Mental Status: She is alert.  Psychiatric:        Mood and Affect: Mood normal.        Behavior: Behavior normal.    ED Results / Procedures / Treatments   Labs (all labs ordered are listed, but only abnormal results are displayed) Labs Reviewed  BASIC METABOLIC PANEL - Abnormal; Notable for the following components:      Result Value   Glucose, Bld 101 (*)    All other components within normal limits    EKG None  Radiology US Venous Img Lower Unilateral Right (DVT)  Result Date: 09/18/2021 CLINICAL DATA:  Right knee pain  for 1 week history of DVT EXAM: Right LOWER EXTREMITY VENOUS DOPPLER ULTRASOUND TECHNIQUE: Gray-scale sonography with compression, as well as color and duplex ultrasound, were performed to evaluate the deep venous system(s) from the level of the common femoral vein through the popliteal and proximal calf veins. COMPARISON:  01/18/2019 FINDINGS: VENOUS Normal compressibility of the common femoral, superficial femoral, and popliteal veins, as well  as the visualized calf veins. Visualized portions of profunda femoral vein and great saphenous vein unremarkable. No filling defects to suggest DVT on grayscale or color Doppler imaging. Doppler waveforms show normal direction of venous flow, normal respiratory plasticity and response to augmentation. Limited views of the contralateral common femoral vein are unremarkable. OTHER None. Limitations: none IMPRESSION: Negative. Electronically Signed   By: Donavan Foil M.D.   On: 09/18/2021 17:49    Procedures Procedures    Medications Ordered in ED Medications - No data to display  ED Course/ Medical Decision Making/ A&P                           Medical Decision Making Amount and/or Complexity of Data Reviewed Labs: ordered. ECG/medicine tests: ordered.   This patient presents to the ED for concern of , this involves an extensive number of treatment options, and is a complaint that carries with it a high risk of complications and morbidity.  The differential diagnosis includes DVT, Baker's cyst, bursitis, cellulitis, muscle strain.  I doubt arterial thrombosis at this time I doubt compartment syndrome.   Co morbidities that complicate the patient evaluation  DVT not on anticoagulation COPD   Additional history obtained:  Additional history obtained from old records and nursing note. External records from outside source obtained and reviewed including previous DVT studies all the way back to 2010.  No evidence of DVT at that time per old  records.   Lab Tests:  I Ordered, and personally interpreted labs.  The pertinent results include: BMP which was negative.  Normal kidney function.   Imaging Studies ordered:  I ordered imaging studies including ultrasound of the right lower extremity. I independently visualized and interpreted imaging which showed no evidence of DVT I agree with the radiologist interpretation   Cardiac Monitoring:  The patient was maintained on a cardiac monitor.  I personally viewed and interpreted the cardiac monitored which showed an underlying rhythm of: Normal sinus rhythm   Problem List / ED Course:  Right lower extremity pain.  This is likely musculoskeletal in nature.  DVT study was negative.  We will have her take Advil for pain control and follow-up with her primary care doctor.  Vital signs are normal and she is safe for discharge   Reevaluation:  After the interventions noted above, I reevaluated the patient and found that they have :improved   Dispostion:  After consideration of the diagnostic results and the patients response to treatment, I feel that the patent would benefit from outpatient follow-up.  She does not meet inpatient criteria at this time.  Strict return precautions were discussed at the bedside with the patient.  I notified results of labs and imaging.  All questions and concerns addressed.  She is safe for discharge  Final Clinical Impression(s) / ED Diagnoses Final diagnoses:  Leg pain    Rx / DC Orders ED Discharge Orders     None         Cherrie Gauze 09/18/21 1812    Hayden Rasmussen, MD 09/19/21 1032

## 2021-09-18 NOTE — ED Notes (Signed)
Dc instructions reviewed with pt no questions or concerns at this time. Will follow up with pcp 

## 2021-09-21 DIAGNOSIS — M47812 Spondylosis without myelopathy or radiculopathy, cervical region: Secondary | ICD-10-CM | POA: Diagnosis not present

## 2021-10-06 DIAGNOSIS — M47812 Spondylosis without myelopathy or radiculopathy, cervical region: Secondary | ICD-10-CM | POA: Diagnosis not present

## 2021-10-12 NOTE — Progress Notes (Signed)
? ?New Patient Note ? ?RE: Ashley Castaneda MRN: [id number] DOB: 1951-01-11 ?Date of Office Visit: 10/13/2021 ? ?Consult requested by: Ashley Castaneda ?Primary care provider: Eulas Post, Castaneda ? ?Chief Complaint: Allergies (Pt is present due to allergies symptoms. She report congestion, ulcers in her mouth, probably foods(onions, cinnamon, tomatoes, citrus foods). some reaction to some insecticide, fews flowers and some scent. Was tested in the 37's. Use to take allergy injections.) and COPD ? ?History of Present Illness: ?I had the pleasure of seeing Ashley Castaneda for initial evaluation at the [facility] on 10/13/2021. She is a 71 y.o. female, who is referred here by Ashley Castaneda for the evaluation of allergic rhinitis and recurrent ulcers. ? ?Recurrent ulcers ?Patient has been having mouth/tongue ulcers for 6-7 years. She usually has an ulcer but she hasn't had one for 1 week now which is unusual for her.  ? ?She tried various mouthwash with some benefit.  ?Patient was evaluated by rheumatologist, ENT, neurology and dentist for this. ?She had biopsy by an oral surgeon which was negative. ?She had a lip biopsy as well about 1 month ago by ENT.  ?Complains of dry eyes and dry skin.  ? ?Trying to avoid cinnamon, citrus fruits and carbonated drinks.  ?Concerned about food triggers.  ? ?Past work up includes: Castaneda recent food testing. ?Dietary History: patient has been eating other foods including milk, eggs, peanut, treenuts, sesame, shellfish, fish, soy, wheat, meats, fruits and vegetables. ? ?Rhinitis:  ?She reports symptoms of nasal congestion, sneezing. Symptoms have been going on for 40+ years. The symptoms are present all year around with worsening in spring and fall. Other triggers include exposure to insecticides, pollen, smoke (husband smokes). Anosmia: Castaneda. Headache: yes. She has used zyrtec, Flonase,Claritin with minimal improvement in symptoms. Sinus infections: Castaneda.  Previous work up includes: last skin testing was positive to cockroaches 5 years ago. Patient was on AIT for 3 years with some benefit. ?Previous ENT evaluation: not for sinus issues, Castaneda prior sinus surgery. ?Previous sinus imaging: yes but normal per patient report.  ?History of nasal polyps: Castaneda. ?History of reflux: yes and takes aciphex. ? ?08/28/2021 PCP visit: ?"Regarding her aphthous ulcers she has some temporary mild improvement with topical steroids.  We have tried colchicine but she did not see any improvement.  She wonders if some of this may be allergic based.  She has not seen an allergist in some time but would like referral to allergist.  She has perennial allergies and uses Singulair and has used antihistamines in the past.  She has osteoarthritis involving multiple joints.  Uses meloxicam for that.  She gets her medications through [place] [facility]. ? ?Pt has hx of perennial allergies and recurrent aphthous ulcers" ? ?Lip biopsy: ?"A. SKIN, LIP, BIOPSY:  ?             SQUAMOUS EPITHELIUM WITH Castaneda ATYPIA. ?             Castaneda SALIVARY GLANDS IDENTIFIED IN SECTIONS EXAMINED. " ? ?Assessment and Plan: ?[name] is a 71 y.o. female with: ?Recurrent aphthous ulcer ?Recurrent mouth/tongue ulcers for 6-7 years. Evaluated by dentist, oral surgeon, rheumatology, neurology and ENT. Concerned for food triggers. Last biopsy showed Castaneda salivary glands. Complains of dry eyes and dry skin. Denies any frequent infections requiring antibiotics.  ?Discussed with patient at length that IgE mediated food allergies (which is what skin testing looks for) does not cause mouth ulcers.  However, there are certain foods that can make ulcers worse and recommend to avoid spicy foods, carbonated beverages, alcohol, citrus fruits, chocolate, coffee, acidic foods such as tomatoes. ?Recommend vaping cessation as the chemicals are most likely worsening the symptoms. ?Stressed importance of rinsing mouth after using ICS inhaler.  ?Follow up with ENT as  scheduled.  ?Stop zyrtec as it can worsen the mucosal dryness.  ?Start proper skin care.  ? ?Chronic rhinitis ?Perennial rhinitis symptoms for 40+ years.  Takes Zyrtec, Flonase, Claritin and Singulair with minimal benefit.  Skin testing over 5 years ago was positive to cockroaches per patient report.  Was on AIT previously for 3 years with some benefit. ?Today's skin prick testing showed: Negative to indoor/outdoor allergens.  ?STOP ZYRTEC as it can worsen the dryness.  ?STOP Singulair.  ?If you notice worsening breathing issues then restart.  ?Use Flonase (fluticasone) nasal spray 1 spray per nostril twice a day as needed for nasal congestion.  ?Nasal saline spray (i.e., Simply Saline) or nasal saline lavage (i.e., NeilMed) is recommended as needed and prior to medicated nasal sprays. ? ?GERD ?See handout for lifestyle and dietary modifications. ?Continue Aciphex daily as prescribed. ? ?Chronic obstructive pulmonary disease ([place]) ?Used to follow with pulmonology. Currently on Breo 100 mcg 1 puff daily with good benefit. Unable to perform spirometry as it triggers her trigeminal neuralgia.  ?Continue inhalers as per PCP. ?Breo 198mg 1 puff once a day and rinse mouth after each use. ?May use albuterol rescue inhaler 2 puffs or nebulizer every 4 to 6 hours as needed for shortness of breath, chest tightness, coughing, and wheezing. Monitor frequency of use.  ? ?Return if symptoms worsen or fail to improve. ? ?Castaneda orders of the defined types were placed in this encounter. ? ?Lab Orders  ?Castaneda laboratory test(s) ordered today  ? ? ?Other allergy screening: ?Asthma: Castaneda ?Patient was diagnosed with COPD and used to follow with pulmonology. ?Currently on Breo 1069m 1 puff daily and albuterol prn depending.  ?Spirometry causes headaches. ? ?Medication allergy: yes ?Ancef - "turned blue" ?See allergy list.  ?Hymenoptera allergy: Castaneda ?Localized reactions. ?Urticaria: Castaneda ?Eczema:Castaneda ?History of recurrent infections suggestive of  immunodeficency: Castaneda ? ?Diagnostics: ?Spirometry:  ?Unable to tolerate.  ? ?Skin Testing: Environmental allergy panel. ?Negative to indoor/outdoor allergens.  ?Results discussed with patient/family. ? Airborne Adult Perc - 10/13/21 1452   ? ? Time Antigen Placed 144825 ? Allergen Manufacturer GrLavella Hammock ? Location Back   ? Number of Test 59   ? Panel 1 Select   ? 1. Control-Buffer 50% Glycerol Negative   ? 2. Control-Histamine 1 mg/ml 2+   ? 3. Albumin saline Negative   ? 4. BaAdairegative   ? 5. BeGuatemalaegative   ? 6. Johnson Negative   ? 7. KeNewmanlue Negative   ? 8. Meadow Fescue Negative   ? 9. Perennial Rye Negative   ? 10. Sweet Vernal Negative   ? 11. Timothy Negative   ? 12. Cocklebur Negative   ? 13. Burweed Marshelder Negative   ? 14. Ragweed, short Negative   ? 15. Ragweed, Giant Negative   ? 16. Plantain,  English Negative   ? 17. Lamb's Quarters Negative   ? 18. Sheep Sorrell Negative   ? 19. Rough Pigweed Negative   ? 20. Marsh Elder, Rough Negative   ? 21. Mugwort, Common Negative   ? 22. Ash mix Negative   ? 23. BiWendee Coppix Negative   ?  24. Beech American Negative   ? 25. Box, Elder Negative   ? 26. Cedar, red Negative   ? 27. Cottonwood, [place] Negative   ? 28. Elm mix Negative   ? 29. Hickory Negative   ? 30. Maple mix Negative   ? 31. Oak, [place] mix Negative   ? 32. Pecan Pollen Negative   ? 33. Pine mix Negative   ? 34. Sycamore Eastern Negative   ? 35. Walnut, Black Pollen Negative   ? 36. Alternaria alternata Negative   ? 64. Cladosporium Herbarum Negative   ? 38. Aspergillus mix Negative   ? 39. Penicillium mix Negative   ? 40. Bipolaris sorokiniana (Helminthosporium) Negative   ? 41. Drechslera spicifera (Curvularia) Negative   ? 42. Mucor plumbeus Negative   ? 43. Fusarium moniliforme Negative   ? 44. Aureobasidium pullulans (pullulara) Negative   ? 45. Rhizopus oryzae Negative   ? 46. Botrytis cinera Negative   ? 47. Epicoccum nigrum Negative   ? 48. Phoma betae Negative   ? 49. Candida  Albicans Negative   ? 50. Trichophyton mentagrophytes Negative   ? 51. Mite, D Farinae  5,000 AU/ml Negative   ? 52. Mite, D Pteronyssinus  5,000 AU/ml Negative   ? 53. Cat Hair 10,000 BAU/ml Negative   ? 54.

## 2021-10-13 ENCOUNTER — Ambulatory Visit (INDEPENDENT_AMBULATORY_CARE_PROVIDER_SITE_OTHER): Payer: Medicare Other | Admitting: Allergy

## 2021-10-13 ENCOUNTER — Encounter: Payer: Self-pay | Admitting: Allergy

## 2021-10-13 ENCOUNTER — Other Ambulatory Visit: Payer: Self-pay

## 2021-10-13 VITALS — BP 124/70 | HR 73 | Temp 98.3°F | Resp 19 | Ht 62.75 in | Wt 244.1 lb

## 2021-10-13 DIAGNOSIS — K219 Gastro-esophageal reflux disease without esophagitis: Secondary | ICD-10-CM

## 2021-10-13 DIAGNOSIS — J310 Chronic rhinitis: Secondary | ICD-10-CM | POA: Diagnosis not present

## 2021-10-13 DIAGNOSIS — J449 Chronic obstructive pulmonary disease, unspecified: Secondary | ICD-10-CM | POA: Diagnosis not present

## 2021-10-13 DIAGNOSIS — K120 Recurrent oral aphthae: Secondary | ICD-10-CM

## 2021-10-13 NOTE — Assessment & Plan Note (Signed)
?   See handout for lifestyle and dietary modifications. ?? Continue Aciphex daily as prescribed. ?

## 2021-10-13 NOTE — Patient Instructions (Addendum)
Today's skin testing showed: ?Negative to indoor/outdoor allergens.  ? ?Results given. ? ?Oral ulcers ?Food allergies typically do not cause only ulcers. There are certain foods that can make ulcers worse and recommend to avoid: spicy foods, carbonated beverages, alcohol, citrus fruits, chocolate, coffee, acidic foods such as tomatoes. ?STOP vaping - chemicals are irritants and most likely making this worse. ?Follow up with ENT - may need repeat biopsy if worsening.  ? ?Rhinitis:  ?STOP ZYRTEC as it can worsen the dryness.  ?STOP Singulair.  ?If you notice worsening breathing issues then restart.  ?Use Flonase (fluticasone) nasal spray 1 spray per nostril twice a day as needed for nasal congestion.  ?Nasal saline spray (i.e., Simply Saline) or nasal saline lavage (i.e., NeilMed) is recommended as needed and prior to medicated nasal sprays. ? ?COPD ?Continue inhalers as per your PCP. ? ?GERD ?See handout for lifestyle and dietary modifications. ?Continue Aciphex daily. ? ?Dry skin ?See below for proper skin care. ? ?Follow up if needed. ? ?Skin care recommendations ? ?Bath time: ?Always use lukewarm water. AVOID very hot or cold water. ?Keep bathing time to 5-10 minutes. ?Do NOT use bubble bath. ?Use a mild soap and use just enough to wash the dirty areas. ?Do NOT scrub skin vigorously.  ?After bathing, pat dry your skin with a towel. Do NOT rub or scrub the skin. ? ?Moisturizers and prescriptions:  ?ALWAYS apply moisturizers immediately after bathing (within 3 minutes). This helps to lock-in moisture. ?Use the moisturizer several times a day over the whole body. ?Good summer moisturizers include: Aveeno, CeraVe, Cetaphil. ?Good winter moisturizers include: Aquaphor, Vaseline, Cerave, Cetaphil, Eucerin, Vanicream. ?When using moisturizers along with medications, the moisturizer should be applied about one hour after applying the medication to prevent diluting effect of the medication or moisturize around where you  applied the medications. When not using medications, the moisturizer can be continued twice daily as maintenance. ? ?Laundry and clothing: ?Avoid laundry products with added color or perfumes. ?Use unscented hypo-allergenic laundry products such as Tide free, Cheer free & gentle, and All free and clear.  ?If the skin still seems dry or sensitive, you can try double-rinsing the clothes. ?Avoid tight or scratchy clothing such as wool. ?Do not use fabric softeners or dyer sheets. ? ?

## 2021-10-13 NOTE — Assessment & Plan Note (Addendum)
Recurrent mouth/tongue ulcers for 6-7 years. Evaluated by dentist, oral surgeon, rheumatology, neurology and ENT. Concerned for food triggers. Last biopsy showed no salivary glands. Complains of dry eyes and dry skin. Denies any frequent infections requiring antibiotics.  ?? Discussed with patient at length that IgE mediated food allergies (which is what skin testing looks for) does not cause mouth ulcers. However, there are certain foods that can make ulcers worse and recommend to avoid spicy foods, carbonated beverages, alcohol, citrus fruits, chocolate, coffee, acidic foods such as tomatoes. ?? Recommend vaping cessation as the chemicals are most likely worsening the symptoms. ?? Stressed importance of rinsing mouth after using ICS inhaler.  ?? Follow up with ENT as scheduled.  ?? Stop zyrtec as it can worsen the mucosal dryness.  ?? Start proper skin care.  ?

## 2021-10-13 NOTE — Assessment & Plan Note (Signed)
Perennial rhinitis symptoms for 40+ years.  Takes Zyrtec, Flonase, Claritin and Singulair with minimal benefit.  Skin testing over 5 years ago was positive to cockroaches per patient report.  Was on AIT previously for 3 years with some benefit. ?? Today's skin prick testing showed: Negative to indoor/outdoor allergens.  ?? STOP ZYRTEC as it can worsen the dryness.  ?? STOP Singulair.  ?? If you notice worsening breathing issues then restart.  ?? Use Flonase (fluticasone) nasal spray 1 spray per nostril twice a day as needed for nasal congestion.  ?? Nasal saline spray (i.e., Simply Saline) or nasal saline lavage (i.e., NeilMed) is recommended as needed and prior to medicated nasal sprays. ?

## 2021-10-13 NOTE — Assessment & Plan Note (Signed)
Used to follow with pulmonology. Currently on Breo 100 mcg 1 puff daily with good benefit. Unable to perform spirometry as it triggers her trigeminal neuralgia.  ?? Continue inhalers as per PCP. ?? Breo 179mg 1 puff once a day and rinse mouth after each use. ?May use albuterol rescue inhaler 2 puffs or nebulizer every 4 to 6 hours as needed for shortness of breath, chest tightness, coughing, and wheezing. Monitor frequency of use.  ?

## 2021-10-15 DIAGNOSIS — H9042 Sensorineural hearing loss, unilateral, left ear, with unrestricted hearing on the contralateral side: Secondary | ICD-10-CM | POA: Diagnosis not present

## 2021-10-20 DIAGNOSIS — R42 Dizziness and giddiness: Secondary | ICD-10-CM | POA: Diagnosis not present

## 2021-10-20 DIAGNOSIS — M47812 Spondylosis without myelopathy or radiculopathy, cervical region: Secondary | ICD-10-CM | POA: Diagnosis not present

## 2021-10-20 DIAGNOSIS — H9042 Sensorineural hearing loss, unilateral, left ear, with unrestricted hearing on the contralateral side: Secondary | ICD-10-CM | POA: Diagnosis not present

## 2021-10-20 DIAGNOSIS — H903 Sensorineural hearing loss, bilateral: Secondary | ICD-10-CM | POA: Diagnosis not present

## 2021-10-20 DIAGNOSIS — R682 Dry mouth, unspecified: Secondary | ICD-10-CM | POA: Diagnosis not present

## 2021-10-30 DIAGNOSIS — H04123 Dry eye syndrome of bilateral lacrimal glands: Secondary | ICD-10-CM | POA: Diagnosis not present

## 2021-11-02 DIAGNOSIS — M47812 Spondylosis without myelopathy or radiculopathy, cervical region: Secondary | ICD-10-CM | POA: Diagnosis not present

## 2021-11-25 ENCOUNTER — Encounter: Payer: Self-pay | Admitting: Family Medicine

## 2021-11-25 ENCOUNTER — Ambulatory Visit (INDEPENDENT_AMBULATORY_CARE_PROVIDER_SITE_OTHER): Payer: Medicare Other | Admitting: Family Medicine

## 2021-11-25 VITALS — BP 130/80 | HR 74 | Temp 97.6°F | Ht 62.75 in | Wt 245.6 lb

## 2021-11-25 DIAGNOSIS — E785 Hyperlipidemia, unspecified: Secondary | ICD-10-CM | POA: Diagnosis not present

## 2021-11-25 DIAGNOSIS — R600 Localized edema: Secondary | ICD-10-CM

## 2021-11-25 DIAGNOSIS — R2689 Other abnormalities of gait and mobility: Secondary | ICD-10-CM

## 2021-11-25 DIAGNOSIS — R739 Hyperglycemia, unspecified: Secondary | ICD-10-CM

## 2021-11-25 DIAGNOSIS — M2550 Pain in unspecified joint: Secondary | ICD-10-CM

## 2021-11-25 DIAGNOSIS — J449 Chronic obstructive pulmonary disease, unspecified: Secondary | ICD-10-CM | POA: Diagnosis not present

## 2021-11-25 DIAGNOSIS — E7800 Pure hypercholesterolemia, unspecified: Secondary | ICD-10-CM

## 2021-11-25 LAB — POCT GLYCOSYLATED HEMOGLOBIN (HGB A1C): Hemoglobin A1C: 5.3 % (ref 4.0–5.6)

## 2021-11-25 MED ORDER — FLUTICASONE FUROATE-VILANTEROL 100-25 MCG/ACT IN AEPB: 1.0000 | INHALATION_SPRAY | Freq: Every day | RESPIRATORY_TRACT | 3 refills | Status: DC

## 2021-11-25 MED ORDER — POTASSIUM CHLORIDE ER 10 MEQ PO TBCR: EXTENDED_RELEASE_TABLET | ORAL | 3 refills | Status: DC

## 2021-11-25 MED ORDER — DEXAMETHASONE 0.5 MG/5ML PO ELIX: ORAL_SOLUTION | ORAL | 2 refills | Status: DC

## 2021-11-25 NOTE — Patient Instructions (Signed)
I will be setting up physical therapy and rheumatology referrals.   ?

## 2021-11-25 NOTE — Progress Notes (Signed)
? ?Established Patient Office Visit ? ?Subjective   ?Patient ID: Ashley Castaneda, female    DOB: July 02, 1951  Age: 71 y.o. MRN: [id number] ? ?Chief Complaint  ?Patient presents with  ? Follow-up  ? ? ?HPI ? ? ?Patient is seen today with multiple issues to discuss.  She has very complicated past medical history and has seen multiple specialist at multiple institutions in the past.  She has history of complicated migraine headaches, remote history of DVT, COPD, recurrent aphthous ulcers of the mouth, GERD, trigeminal neuralgia, reflex sympathetic dystrophy (complex regional pain syndrome), fibromyalgia, osteoarthritis, chronic bilateral lower extremity edema, obesity with recent increasing weight gain, hyperlipidemia.  She has recently seen specialist over at [place].  She had what sounds like a mucosal biopsy lower lip with some clinical suspicion of Sjogren's syndrome.  Her blood work was not conclusive.  Biopsies did not confirm this.  She has had some chronic dry eyes and intermittent dry mouth. ? ?She has multiple nonspecific complaints today including fatigue, chronic ongoing bilateral leg edema, diffuse arthralgias, dry mouth and eyes, dry skin.  She had TSH back in November which was normal. ? ?She is requesting several refills today including potassium chloride, Breo Ellipta ? ?Has been steadily gaining weight recently.  Very sedentary.  Denies any dietary changes.  She inquires about possible weight loss medications.  Does not have any history of diabetes.  Has had occasional blood sugars in the past in the prediabetes range.  She specifically had questions regarding "sea moss, and L-lysine"-and I explained that I do not have any knowledge regarding those being effective for long-term weight loss. ? ?She states she has had some chronic balance difficulties.  She is specifically requesting referral to local physical therapist for balance training and fall risk reduction. ? ?She has been very frustrated with  her multiple ongoing symptoms.  She has arthralgias involving multiple joints.  She specifically is requesting referral to rheumatologist from [place] ? ?Past Medical History:  ?Diagnosis Date  ? Adenomatous colon polyp 09/23/2008  ? ALLERGIC RHINITIS 07/01/2008  ? ANXIETY 07/01/2008  ? BACK PAIN, THORACIC REGION 03/04/2010  ? BRONCHITIS, CHRONIC 07/01/2008  ? CARPAL TUNNEL SYNDROME, HX OF 07/01/2008  ? CHRONIC OBSTRUCTIVE PULMONARY DISEASE, ACUTE EXACERBATION 08/07/2010  ? COPD (chronic obstructive pulmonary disease) ([place])   ? DEPRESSION 07/01/2008  ? DVT, HX OF 07/01/2008  ? ECZEMA 05/29/2009  ? FACIAL PAIN 12/25/2009  ? FIBROMYALGIA 07/01/2008  ? GERD 07/01/2008  ? Hiatal hernia   ? Hyperlipidemia   ? LEG PAIN, BILATERAL 07/01/2008  ? MIGRAINES, HX OF 07/01/2008  ? OCCIPITAL NEURALGIA 07/01/2008  ? OSTEOARTHRITIS 07/01/2008  ? Other specified trigeminal nerve disorders 07/01/2008  ? REFLEX SYMPATHETIC DYSTROPHY 07/01/2008  ? Trigeminal neuralgia 05/21/2009  ? Unspecified vitamin D deficiency 03/31/2010  ? ?Past Surgical History:  ?Procedure Laterality Date  ? CARPAL TUNNEL RELEASE  07/27/2007  ? RIGHT  ? EYE SURGERY    ? HEAD CRANIECTOMY  07/26/1992  ? MICROVASULAR DECOMPRESSION  ? LAVH    ? BSO  ? LEFT KNEE SURGERY  07/27/1987  ? OCCIPITAL NERVE STIMULATOR INSERTION    ? RIGHT FOOT BONE SPUR  07/26/1985  ? RIGHT FOOT SURGERY  07/26/1996  ? BAD CUT  ? RIGHT JAW SURGERY  07/26/2000  ? RIGHT KNEE SURGERY  07/26/1988  ? [place]  ? RIGHT THUMB SUGERY  07/26/2002  ? tear duct surgery    ? TONSILLECTOMY  07/27/1955  ? ?  reports that she quit smoking about 9 years ago. Her smoking use included cigarettes. She started smoking about 36 years ago. She has a 60.00 pack-year smoking history. She has been exposed to tobacco smoke. She has never used smokeless tobacco. She reports that she does not drink alcohol and does not use drugs. ?family history includes Heart attack in her father and  maternal grandmother; Hypertension in her father; Ovarian cancer (age of onset: 78) in her mother; Prostate cancer in her father; Stroke in her paternal grandmother. ?Allergies  ?Allergen Reactions  ? Cefazolin Anaphylaxis, Swelling and Other (See Comments)  ?  Ancef - in surgery, swelled up, turned blue, heart problems ?Turns Blue  ? Baclofen Other (See Comments)  ?  REACTION: confusion ?REACTION: confusion ?REACTION: confusion ?REACTION: confusion  ? Carbamazepine Other (See Comments)  ?  REACTION: confusion ?Other reaction(s): Confusion ?Confusion,Bad reactions to all seizure medicine  ? Duloxetine Other (See Comments)  ? Fentanyl Nausea And Vomiting and Other (See Comments)  ?  REACTION: nausea and vomiting  ? Gabapentin Other (See Comments)  ?  REACTION: confusion  ? Naproxen Other (See Comments)  ?  Elevates blood pressure  ? Oxcarbazepine Other (See Comments)  ?  REACTION: confusion ?Other reaction(s): Other ?REACTION: confusion ?REACTION: confusion ?REACTION: confusion ?  ? Pregabalin Other (See Comments)  ?  Other reaction(s): Other  ? Serotonin Reuptake Inhibitors (Ssris) Other (See Comments)  ? Topiramate Other (See Comments)  ?  Other reaction(s): Unknown  ? Venlafaxine Other (See Comments)  ?  Other reaction(s): Unknown  ? Zonisamide Other (See Comments)  ?  Other reaction(s): Other  ? Ceclor [Cefaclor]   ?  hives  ? Milnacipran Other (See Comments)  ? Ondansetron Nausea And Vomiting  ?  Nausea and vomiting after IV administration  ? ? ?Review of Systems  ?Constitutional:  Positive for malaise/fatigue. Negative for chills and fever.  ?Respiratory:  Negative for cough, shortness of breath and wheezing.   ?Cardiovascular:  Positive for leg swelling. Negative for chest pain and PND.  ?Gastrointestinal:  Negative for diarrhea, nausea and vomiting.  ?Genitourinary:  Negative for dysuria.  ?Musculoskeletal:  Positive for back pain.  ? ?  ?Objective:  ?  ? ?BP 130/80 (BP Location: Left Arm, Patient Position:  Sitting, Cuff Size: Large)   Pulse 74   Temp 97.6 ?F (36.4 ?C) (Oral)   Ht 5' 2.75" (1.594 m)   Wt 245 lb 9.6 oz (111.4 kg)   LMP 07/26/1992   SpO2 95%   BMI 43.85 kg/m?  ? ? ?Physical Exam ?Vitals reviewed.  ?Constitutional:   ?   Appearance: She is obese.  ?Cardiovascular:  ?   Rate and Rhythm: Normal rate and regular rhythm.  ?Pulmonary:  ?   Effort: Pulmonary effort is normal.  ?   Breath sounds: Normal breath sounds. No wheezing or rales.  ?Musculoskeletal:  ?   Comments: Trace nonpitting edema lower legs today bilaterally  ?Skin: ?   Findings: No rash.  ?Neurological:  ?   General: No focal deficit present.  ?   Mental Status: She is alert.  ? ? ? ?Results for orders placed or performed in visit on 11/25/21  ?POCT glycosylated hemoglobin (Hb A1C)  ?Result Value Ref Range  ? Hemoglobin A1C 5.3 4.0 - 5.6 %  ? HbA1c POC (<> result, manual entry)    ? HbA1c, POC (prediabetic range)    ? HbA1c, POC (controlled diabetic range)    ? ? ? ? ?The  10-year ASCVD risk score (Arnett DK, et al., 2019) is: 34.4% ? ?  ?Assessment & Plan:  ? ?#1 history of bilateral leg edema.  Suspect multifactorial.  She has been relatively stable on torsemide low-dose of 10 mg daily really now for a few years.  Most recent electrolytes stable.  Refill potassium 10 mill equivalents daily.   Continue with Torsemide 10 mg daily. ? ?#2 concerns for balance problem.  Patient requesting referral to local PT.  This will be set up. ? ?#3 chronic polyarthralgias.  She has seen multiple specialists previously.  Question of Sjogren's syndrome.  Patient requesting referral to new rheumatologist. ?-Set up referral to [facility] rheumatology ? ?#4 COPD.  Symptomatically stable at this time.  Refill Breo Ellipta inhaler.  Continue annual flu vaccine ? ?#5 weight gain.  Recent TSH normal.  Suspect related to her sedentary lifestyle and overall caloric excess.  She specifically had questions regarding medications.  We checked blood sugar and A1c was 5.3%.   We did discuss possible medications such as Mancel Parsons but she would need to check on insurance coverage. ? ?Over 40 minutes spent reviewing her outside records, discussing multiple issues above, refilling medica

## 2021-12-01 ENCOUNTER — Encounter: Payer: Self-pay | Admitting: Family Medicine

## 2021-12-01 ENCOUNTER — Telehealth (INDEPENDENT_AMBULATORY_CARE_PROVIDER_SITE_OTHER): Payer: Medicare Other | Admitting: Family Medicine

## 2021-12-01 VITALS — HR 78 | Ht 62.75 in | Wt 245.6 lb

## 2021-12-01 DIAGNOSIS — J069 Acute upper respiratory infection, unspecified: Secondary | ICD-10-CM

## 2021-12-01 NOTE — Progress Notes (Signed)
Patient ID: Ashley Castaneda, female   DOB: [birth year], 71 y.o.   MRN: [id number] ? ? ?Virtual Visit via Video Note ? ?I connected with Ashley Castaneda on 12/01/21 at  3:00 PM EDT by a video enabled telemedicine application and verified that I am speaking with the correct person using two identifiers. ? Location patient: home ?Location provider:work or home office ?Persons participating in the virtual visit: patient, provider ? ?I discussed the limitations of evaluation and management by telemedicine and the availability of in person appointments. The patient expressed understanding and agreed to proceed. ? ? ?HPI: ?Generally relates onset this past Sunday of nasal congestion, chills, sore throat, headache, malaise.  She used her husband's nebulizer couple times but not see much difference.  She has had some dyspnea but O2 sats 96%.  No chest pain.  No pleuritic pain.  No nausea or vomiting.  She is done 3 COVID test all negative.  No known sick contacts.  Her major concern is that her husband is very debilitated and she is trying to stay separate from him.  She has taken some over-the-counter Mucinex.  Does have reported COPD.  Cough productive of clear sputum.  No documented fever. ? ? ?ROS: See pertinent positives and negatives per HPI. ? ?Past Medical History:  ?Diagnosis Date  ? Adenomatous colon polyp 09/23/2008  ? ALLERGIC RHINITIS 07/01/2008  ? ANXIETY 07/01/2008  ? BACK PAIN, THORACIC REGION 03/04/2010  ? BRONCHITIS, CHRONIC 07/01/2008  ? CARPAL TUNNEL SYNDROME, HX OF 07/01/2008  ? CHRONIC OBSTRUCTIVE PULMONARY DISEASE, ACUTE EXACERBATION 08/07/2010  ? COPD (chronic obstructive pulmonary disease) ([place])   ? DEPRESSION 07/01/2008  ? DVT, HX OF 07/01/2008  ? ECZEMA 05/29/2009  ? FACIAL PAIN 12/25/2009  ? FIBROMYALGIA 07/01/2008  ? GERD 07/01/2008  ? Hiatal hernia   ? Hyperlipidemia   ? LEG PAIN, BILATERAL 07/01/2008  ? MIGRAINES, HX OF 07/01/2008  ? OCCIPITAL NEURALGIA 07/01/2008  ? OSTEOARTHRITIS 07/01/2008  ? Other  specified trigeminal nerve disorders 07/01/2008  ? REFLEX SYMPATHETIC DYSTROPHY 07/01/2008  ? Trigeminal neuralgia 05/21/2009  ? Unspecified vitamin D deficiency 03/31/2010  ? ? ?Past Surgical History:  ?Procedure Laterality Date  ? CARPAL TUNNEL RELEASE  07/27/2007  ? RIGHT  ? EYE SURGERY    ? HEAD CRANIECTOMY  07/26/1992  ? MICROVASULAR DECOMPRESSION  ? LAVH    ? BSO  ? LEFT KNEE SURGERY  07/27/1987  ? OCCIPITAL NERVE STIMULATOR INSERTION    ? RIGHT FOOT BONE SPUR  07/26/1985  ? RIGHT FOOT SURGERY  07/26/1996  ? BAD CUT  ? RIGHT JAW SURGERY  07/26/2000  ? RIGHT KNEE SURGERY  07/26/1988  ? [place]  ? RIGHT THUMB SUGERY  07/26/2002  ? tear duct surgery    ? TONSILLECTOMY  07/27/1955  ? ? ?Family History  ?Problem Relation Age of Onset  ? Ovarian cancer Mother 70  ? Hypertension Father   ? Prostate cancer Father   ? Heart attack Father   ? Heart attack Maternal Grandmother   ? Stroke Paternal Grandmother   ? ? ?SOCIAL HX: Quit smoking around 2013 ? ? ?Current Outpatient Medications:  ?  albuterol (PROAIR HFA) 108 (90 Base) MCG/ACT inhaler, Inhale 1-2 puffs into the lungs every 6 (six) hours as needed for wheezing or shortness of breath., Disp: 8 g, Rfl: 1 ?  ALPRAZolam (XANAX) 0.5 MG tablet, Take 1 tablet (0.5 mg total) by mouth 2 (two) times daily., Disp: 30 tablet, Rfl:  0 ?  aspirin 325 MG tablet, daily., Disp: , Rfl:  ?  calcium carbonate (OS-CAL - DOSED IN MG OF ELEMENTAL CALCIUM) 1250 MG tablet, Take 1 tablet by mouth daily., Disp: , Rfl:  ?  Calcium Citrate-Vitamin D (CAL-CITRATE PLUS VITAMIN D) 250-2.5 MG-MCG TABS, Take 2 tablets by mouth 2 (two) times daily., Disp: , Rfl:  ?  carboxymethylcellulose (REFRESH PLUS) 0.5 % SOLN, Take 1 drop by mouth daily as needed. For dry eyes, Disp: , Rfl:  ?  chlorhexidine (PERIDEX) 0.12 % solution, chlorhexidine gluconate 0.12 % mouthwash  RINSE WITH 15 MLS BY MOUTH TWICE DAILY AFTER MEALS. SWISH FOR 30 SECONDS THEN SPIT OUT, Disp: ,  Rfl:  ?  colchicine 0.6 MG tablet, Take by mouth., Disp: , Rfl:  ?  cycloSPORINE (RESTASIS) 0.05 % ophthalmic emulsion, Place 1 drop into both eyes 2 (two) times daily., Disp: , Rfl:  ?  cycloSPORINE (RESTASIS) 0.05 % ophthalmic emulsion, Place 1 drop into both eyes every 12 (twelve) hours., Disp: , Rfl:  ?  dantrolene (DANTRIUM) 25 MG capsule, dantrolene 25 mg capsule  Take 1 capsule 4 times a day by oral route as needed for 90 days., Disp: , Rfl:  ?  dexamethasone 0.5 MG/5ML elixir, Take two teaspoons and swish, gargle, and spit qid prn canker sores., Disp: 237 mL, Rfl: 2 ?  dronabinol (MARINOL) 5 MG capsule, dronabinol 5 mg capsule  TAKE ONE CAPSULE BY MOUTH TWICE A DAY, Disp: , Rfl:  ?  Eptinezumab-jjmr (VYEPTI IV), Inject into the vein., Disp: , Rfl:  ?  fluticasone (FLONASE) 50 MCG/ACT nasal spray, Place 2 sprays into both nostrils daily as needed. For nasal congestion, Disp: 16 g, Rfl: 3 ?  fluticasone furoate-vilanterol (BREO ELLIPTA) 100-25 MCG/ACT AEPB, Inhale 1 puff into the lungs daily., Disp: 90 each, Rfl: 3 ?  frovatriptan (FROVA) 2.5 MG tablet, Take 2.5 mg by mouth as needed. If recurs, may repeat after 2 hours. Max of 3 tabs in 24 hours. For migraines., Disp: , Rfl:  ?  lidocaine (LIDODERM) 5 %, Place 1 patch onto the skin daily. Remove & Discard patch within 12 hours or as directed by MD, Disp: 30 patch, Rfl: 1 ?  meclizine (ANTIVERT) 25 MG tablet, TAKE 1 TABLET BY MOUTH 4 TIMES A DAY AS NEEDED FOR DIZZINESS, Disp: 120 tablet, Rfl: 1 ?  meloxicam (MOBIC) 15 MG tablet, Take 1 tablet (15 mg total) by mouth daily., Disp: 90 tablet, Rfl: 3 ?  metaxalone (SKELAXIN) 800 MG tablet, Take 800 mg by mouth 3 (three) times daily., Disp: , Rfl:  ?  Multiple Vitamin (MULTIVITAMIN) tablet, Take 1 tablet by mouth every morning., Disp: , Rfl:  ?  Omega-3 Fatty Acids (FISH OIL) 1200 MG CAPS, Take 1 capsule by mouth every evening., Disp: , Rfl:  ?  potassium chloride (KLOR-CON) 10 MEQ tablet, Take two tablets once  daily, Disp: 90 tablet, Rfl: 3 ?  RABEprazole (ACIPHEX) 20 MG tablet, Take 1 tablet (20 mg total) by mouth daily., Disp: 90 tablet, Rfl: 3 ?  Rimegepant Sulfate (NURTEC) 75 MG TBDP, Take by mouth., Disp: , Rfl:  ?  silodosin (RAPAFLO) 8 MG CAPS capsule, Take 1 capsule (8 mg total) by mouth daily with breakfast., Disp: 90 capsule, Rfl: 0 ?  tiZANidine (ZANAFLEX) 2 MG tablet, Take 2 mg by mouth at bedtime., Disp: , Rfl:  ?  torsemide (DEMADEX) 10 MG tablet, Take 1 tablet (10 mg total) by mouth daily., Disp: 90 tablet, Rfl: 3 ?  vitamin E 400 UNIT capsule, Take 400 Units by mouth daily at 6 PM., Disp: , Rfl:  ? ?EXAM: ? ?VITALS per patient if applicable: ? ?GENERAL: alert, oriented, appears well and in no acute distress ? ?HEENT: atraumatic, conjunttiva clear, no obvious abnormalities on inspection of external nose and ears ? ?NECK: normal movements of the head and neck ? ?LUNGS: on inspection no signs of respiratory distress, breathing rate appears normal, no obvious gross SOB, gasping or wheezing ? ?CV: no obvious cyanosis ? ?MS: moves all visible extremities without noticeable abnormality ? ?PSYCH/NEURO: pleasant and cooperative, no obvious depression or anxiety, speech and thought processing grossly intact ? ?ASSESSMENT AND PLAN: ? ?Discussed the following assessment and plan: ? ?Probable viral URI with cough.  Patient had acute onset of multiple symptoms above couple days ago.  O2 sats reportedly around 96%. ? ?-Continue to monitor O2 sats ?-Consider Mucinex 1200 mg twice daily ?-Plenty of fluids and rest ?-Follow-up promptly for any fever, progressive shortness of breath, or other concerns ? ? ?  ?I discussed the assessment and treatment plan with the patient. The patient was provided an opportunity to ask questions and all were answered. The patient agreed with the plan and demonstrated an understanding of the instructions. ?  ?The patient was advised to call back or seek an in-person evaluation if the symptoms  worsen or if the condition fails to improve as anticipated. ? ? ? ? ?Carolann Littler, MD  ? ? ?

## 2021-12-06 ENCOUNTER — Ambulatory Visit (HOSPITAL_COMMUNITY)
Admission: EM | Admit: 2021-12-06 | Discharge: 2021-12-06 | Disposition: A | Discharge status: Home / Self Care | Payer: Medicare Other | Attending: Internal Medicine | Admitting: Internal Medicine

## 2021-12-06 ENCOUNTER — Encounter (HOSPITAL_COMMUNITY): Payer: Self-pay | Admitting: Emergency Medicine

## 2021-12-06 DIAGNOSIS — J069 Acute upper respiratory infection, unspecified: Secondary | ICD-10-CM | POA: Diagnosis not present

## 2021-12-06 DIAGNOSIS — R051 Acute cough: Secondary | ICD-10-CM

## 2021-12-06 MED ORDER — BENZONATATE 100 MG PO CAPS: 100.0000 mg | ORAL_CAPSULE | Freq: Three times a day (TID) | ORAL | 0 refills | Status: DC

## 2021-12-06 MED ORDER — DOXYCYCLINE HYCLATE 100 MG PO CAPS: 100.0000 mg | ORAL_CAPSULE | Freq: Two times a day (BID) | ORAL | 0 refills | Status: AC

## 2021-12-06 MED ORDER — PROBIOTIC 1-250 BILLION-MG PO CAPS: 1.0000 | ORAL_CAPSULE | Freq: Every day | ORAL | 0 refills | Status: AC

## 2021-12-06 MED ORDER — FLUCONAZOLE 150 MG PO TABS: 150.0000 mg | ORAL_TABLET | Freq: Every day | ORAL | 1 refills | Status: DC

## 2021-12-06 MED ORDER — PREDNISONE 10 MG PO TABS: 20.0000 mg | ORAL_TABLET | Freq: Every day | ORAL | 0 refills | Status: AC

## 2021-12-06 NOTE — ED Triage Notes (Signed)
Pt reports SOB, nasal congestion, chest congestion and productive cough x 1 week. States had virtual appointment and was dx with URI. Tried Mucinex, flonase and inhalers with little relief. States coughing up yellow phlegm.  ?

## 2021-12-06 NOTE — ED Provider Notes (Signed)
?[facility] ? ? ? ?CSN: [id number] ?Arrival date & time: 12/06/21  1312 ? ? ?  ? ?History   ?Chief Complaint ?Chief Complaint  ?Patient presents with  ? Nasal Congestion  ? Shortness of Breath  ? chest congestion   ? ? ?HPI ?Ashley Castaneda is a 71 y.o. female.  ? ?Patient presents to urgent care for evaluation of her cough, nasal congestion, and chills. She was seen for an e-visit for her symptoms and prescribed flonase, and mucinex that mildly helps with symptoms, but her symptoms have persisted. Cough and nasal congestion improve during the day and become worse at night. Cough is productive with yellow sputum. Nasal mucous is white/clear. Sputum from cough and nose is thick. Albuterol inhaler helps with wheezing and shortness of breath related to cough. Reports significant past medical history of chronic bronchitis and COPD and states that whenever she gets sick, she always ends up on antibiotics due to her lung function. She quit smoking over 10 years ago, but continues to vape with some nicotine. She was around her 75 year old granddaughter who was sick with a sore throat, but tested "negative for everything at the doctor". Denies nausea/vomiting and abdominal pain. Denies urinary symptoms. 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 She take meclizine 4 times daily for it. She states that her left ear "stopped up" last night and "didn't want to pop". She has chronic headaches for which she takes tylenol. Denies any other aggravating or relieving factors.  ? ? ?Shortness of Breath ? ?Past Medical History:  ?Diagnosis Date  ? Adenomatous colon polyp 09/23/2008  ? ALLERGIC RHINITIS 07/01/2008  ? ANXIETY 07/01/2008  ? BACK PAIN, THORACIC REGION 03/04/2010  ? BRONCHITIS, CHRONIC 07/01/2008  ? CARPAL TUNNEL SYNDROME, HX OF 07/01/2008  ? CHRONIC OBSTRUCTIVE PULMONARY DISEASE, ACUTE EXACERBATION 08/07/2010  ? COPD (chronic obstructive pulmonary disease) ([place])   ?  DEPRESSION 07/01/2008  ? DVT, HX OF 07/01/2008  ? ECZEMA 05/29/2009  ? FACIAL PAIN 12/25/2009  ? FIBROMYALGIA 07/01/2008  ? GERD 07/01/2008  ? Hiatal hernia   ? Hyperlipidemia   ? LEG PAIN, BILATERAL 07/01/2008  ? MIGRAINES, HX OF 07/01/2008  ? OCCIPITAL NEURALGIA 07/01/2008  ? OSTEOARTHRITIS 07/01/2008  ? Other specified trigeminal nerve disorders 07/01/2008  ? REFLEX SYMPATHETIC DYSTROPHY 07/01/2008  ? Trigeminal neuralgia 05/21/2009  ? Unspecified vitamin D deficiency 03/31/2010  ? ? ?Patient Active Problem List  ? Diagnosis Date Noted  ? Chronic rhinitis 10/13/2021  ? Chronic obstructive pulmonary disease ([place]) 10/13/2021  ? Encounter for Medicare annual wellness exam 07/07/2020  ? Status post right cataract extraction 04/10/2020  ? DOE (dyspnea on exertion) 10/17/2019  ? Recurrent aphthous ulcer 03/27/2019  ? Chronic bilateral low back pain without sciatica 02/09/2019  ? Pain in surgical scar 08/01/2018  ? Bilateral leg edema 09/28/2017  ? Infected surgical wound 09/14/2017  ? Status post insertion of spinal cord stimulator 09/14/2017  ? Obesity 10/08/2015  ? Migraine headache 01/18/2014  ? Dizziness and giddiness 01/18/2014  ? Numbness and tingling of both legs 01/18/2014  ? Severe obesity (BMI >= 40) ([place]) 06/14/2013  ? Numbness and tingling in right hand 05/30/2013  ? Hyperlipidemia 06/06/2012  ? Fatigue 06/06/2012  ? Dvt femoral (deep venous thrombosis) ([place]) 10/28/2011  ? CHRONIC OBSTRUCTIVE PULMONARY DISEASE, ACUTE EXACERBATION 08/07/2010  ? UNSPECIFIED VITAMIN D DEFICIENCY 03/31/2010  ? BACK PAIN, THORACIC REGION 03/04/2010  ? ECZEMA 05/29/2009  ? TRIGEMINAL  NEURALGIA 05/21/2009  ? Anxiety state 07/01/2008  ? DEPRESSION 07/01/2008  ? REFLEX SYMPATHETIC DYSTROPHY 07/01/2008  ? OTHER SPECIFIED TRIGEMINAL NERVE DISORDERS 07/01/2008  ? Allergic rhinitis 07/01/2008  ? BRONCHITIS, CHRONIC 07/01/2008  ? GERD 07/01/2008  ? OSTEOARTHRITIS 07/01/2008  ? OCCIPITAL NEURALGIA 07/01/2008  ? FIBROMYALGIA 07/01/2008  ?  CARPAL TUNNEL SYNDROME, HX OF 07/01/2008  ? MIGRAINES, HX OF 07/01/2008  ? ? ?Past Surgical History:  ?Procedure Laterality Date  ? CARPAL TUNNEL RELEASE  07/27/2007  ? RIGHT  ? EYE SURGERY    ? HEAD CRANIECTOMY  07/26/1992  ? MICROVASULAR DECOMPRESSION  ? LAVH    ? BSO  ? LEFT KNEE SURGERY  07/27/1987  ? OCCIPITAL NERVE STIMULATOR INSERTION    ? RIGHT FOOT BONE SPUR  07/26/1985  ? RIGHT FOOT SURGERY  07/26/1996  ? BAD CUT  ? RIGHT JAW SURGERY  07/26/2000  ? RIGHT KNEE SURGERY  07/26/1988  ? [place]  ? RIGHT THUMB SUGERY  07/26/2002  ? tear duct surgery    ? TONSILLECTOMY  07/27/1955  ? ? ?OB History   ? ? Gravida  ?0  ? Para  ?   ? Term  ?   ? Preterm  ?   ? AB  ?   ? Living  ?   ?  ? ? SAB  ?   ? IAB  ?   ? Ectopic  ?   ? Multiple  ?   ? Live Births  ?   ?   ?  ?  ? ? ? ?Home Medications   ? ?Prior to Admission medications   ?Medication Sig Start Date End Date Taking? Authorizing Provider  ?Bacillus Coagulans-Inulin (PROBIOTIC) 1-250 BILLION-MG CAPS Take 1 tablet by mouth daily for 7 days. 12/06/21 12/13/21 Yes Talbot Grumbling, FNP  ?benzonatate (TESSALON) 100 MG capsule Take 1 capsule (100 mg total) by mouth every 8 (eight) hours. 12/06/21  Yes Talbot Grumbling, FNP  ?doxycycline (VIBRAMYCIN) 100 MG capsule Take 1 capsule (100 mg total) by mouth 2 (two) times daily for 7 days. 12/06/21 12/13/21 Yes Talbot Grumbling, FNP  ?fluconazole (DIFLUCAN) 150 MG tablet Take 1 tablet (150 mg total) by mouth daily. 12/06/21  Yes Talbot Grumbling, FNP  ?predniSONE (DELTASONE) 10 MG tablet Take 2 tablets (20 mg total) by mouth daily for 5 days. 12/06/21 12/11/21 Yes Talbot Grumbling, FNP  ?albuterol (PROAIR HFA) 108 (90 Base) MCG/ACT inhaler Inhale 1-2 puffs into the lungs every 6 (six) hours as needed for wheezing or shortness of breath. 08/28/21   Burchette, Alinda Sierras, MD  ?ALPRAZolam Duanne Moron) 0.5 MG tablet Take 1 tablet (0.5 mg total) by mouth 2 (two) times daily. 08/31/21    Burchette, Alinda Sierras, MD  ?aspirin 325 MG tablet daily.    [provider]  ?calcium carbonate (OS-CAL - DOSED IN MG OF ELEMENTAL CALCIUM) 1250 MG tablet Take 1 tablet by mouth daily.    [provider]  ?Calcium Citrate-Vitamin D (CAL-CITRATE PLUS VITAMIN D) 250-2.5 MG-MCG TABS Take 2 tablets by mouth 2 (two) times daily.    [provider]  ?carboxymethylcellulose (REFRESH PLUS) 0.5 % SOLN Take 1 drop by mouth daily as needed. For dry eyes    [provider]  ?chlorhexidine (PERIDEX) 0.12 % solution chlorhexidine gluconate 0.12 % mouthwash ? RINSE WITH 15 MLS BY MOUTH TWICE DAILY AFTER MEALS. SWISH FOR 30 SECONDS THEN SPIT OUT    [provider]  ?colchicine 0.6 MG tablet Take by mouth. 08/04/20   [provider]  ?cycloSPORINE (RESTASIS) 0.05 % ophthalmic emulsion Place 1 drop into both eyes 2 (two) times daily.    [provider]  ?cycloSPORINE (RESTASIS) 0.05 % ophthalmic emulsion Place 1 drop into both eyes every 12 (twelve) hours. 05/07/21   [provider]  ?dantrolene (DANTRIUM) 25 MG capsule dantrolene 25 mg capsule ? Take 1 capsule 4 times a day by oral route as needed for 90 days.    [provider]  ?dexamethasone 0.5 MG/5ML elixir Take two teaspoons and swish, gargle, and spit qid prn canker sores. 11/25/21   Burchette, Alinda Sierras, MD  ?dronabinol (MARINOL) 5 MG capsule dronabinol 5 mg capsule ? TAKE ONE CAPSULE BY MOUTH TWICE A DAY    [provider]  ?Eptinezumab-jjmr (VYEPTI IV) Inject into the vein.    [provider]  ?fluticasone (FLONASE) 50 MCG/ACT nasal spray Place 2 sprays into both nostrils daily as needed. For nasal congestion 08/28/21 08/05/23  Burchette, Alinda Sierras, MD  ?fluticasone furoate-vilanterol (BREO ELLIPTA) 100-25 MCG/ACT AEPB Inhale 1 puff into the lungs daily. 11/25/21   Burchette, Alinda Sierras, MD  ?frovatriptan (FROVA) 2.5 MG tablet Take 2.5 mg by mouth as needed. If recurs, may repeat after 2  hours. Max of 3 tabs in 24 hours. For migraines.    [provider]  ?lidocaine (LIDODERM) 5 % Place 1 patch onto the skin daily. Remove & Discard patch within 12 hours or as directed by MD 04/12/18   Burke Keels

## 2021-12-06 NOTE — Discharge Instructions (Addendum)
You were seen in urgent care today for a postviral bacterial infection.  Take doxycycline twice a day for the next 7 days with food.  Also take a probiotic that I have prescribed with this to prevent GI upset and diarrhea.  I have prescribed Diflucan for you to take today to prevent any vaginal yeast infection that you may develop due to doxycycline use.  If you still have vaginal itching 7 days from your first dose of Diflucan, you may go to the pharmacy and pick up a refill that I have sent in.  You may take Tessalon Perles every 8 hours as prescribed for cough.  You may take Tylenol for any head pain, fever, or pain you may have.  Take prednisone once daily for the next 5 days to decrease inflammation.  Be sure to take this medication with food as it can upset your stomach. ? ?If you develop any new or worsening symptoms or do not improve in the next 2 to 3 days, please return.  If your symptoms are severe, please go to the emergency room.  Follow-up with your primary care provider for further evaluation and management of your symptoms as well as ongoing wellness visits.  I hope you feel better! ?

## 2021-12-11 ENCOUNTER — Telehealth: Payer: Self-pay | Admitting: Family Medicine

## 2021-12-11 NOTE — Telephone Encounter (Signed)
Pt informed of the message and verbalized understanding  

## 2021-12-11 NOTE — Telephone Encounter (Signed)
I spoke with the pt and informed her to visit the ED if she has any difficulty breathing, chest pressure/ pain or tightness. Pt wanted message sent to PCP to see if provider wants her to take additional medication or to see how she should proceed with the management of her symptoms.

## 2021-12-11 NOTE — Telephone Encounter (Signed)
c/o congestion. thick yellow mucus, chest sore from coughing. Pt had mychart video visit on 12/01/21 and went to emergency care on 12/06/21 was prescribed various meds, stil worsening. Pt seeking medication to help with worsening symptoms

## 2021-12-14 ENCOUNTER — Encounter (HOSPITAL_BASED_OUTPATIENT_CLINIC_OR_DEPARTMENT_OTHER): Payer: Self-pay

## 2021-12-14 ENCOUNTER — Emergency Department (HOSPITAL_BASED_OUTPATIENT_CLINIC_OR_DEPARTMENT_OTHER): Payer: Medicare Other | Admitting: Radiology

## 2021-12-14 ENCOUNTER — Other Ambulatory Visit: Payer: Self-pay

## 2021-12-14 ENCOUNTER — Emergency Department (HOSPITAL_BASED_OUTPATIENT_CLINIC_OR_DEPARTMENT_OTHER)
Admission: EM | Admit: 2021-12-14 | Discharge: 2021-12-14 | Disposition: A | Discharge status: Home / Self Care | Payer: Medicare Other | Attending: Emergency Medicine | Admitting: Emergency Medicine

## 2021-12-14 DIAGNOSIS — J029 Acute pharyngitis, unspecified: Secondary | ICD-10-CM | POA: Diagnosis not present

## 2021-12-14 DIAGNOSIS — Z20822 Contact with and (suspected) exposure to covid-19: Secondary | ICD-10-CM | POA: Insufficient documentation

## 2021-12-14 DIAGNOSIS — R059 Cough, unspecified: Secondary | ICD-10-CM | POA: Diagnosis not present

## 2021-12-14 DIAGNOSIS — Z7982 Long term (current) use of aspirin: Secondary | ICD-10-CM | POA: Insufficient documentation

## 2021-12-14 DIAGNOSIS — Z7951 Long term (current) use of inhaled steroids: Secondary | ICD-10-CM | POA: Insufficient documentation

## 2021-12-14 DIAGNOSIS — R058 Other specified cough: Secondary | ICD-10-CM

## 2021-12-14 LAB — RESP PANEL BY RT-PCR (FLU A&B, COVID) ARPGX2
Influenza A by PCR: NEGATIVE
Influenza B by PCR: NEGATIVE
SARS Coronavirus 2 by RT PCR: NEGATIVE

## 2021-12-14 MED ORDER — FLUCONAZOLE 150 MG PO TABS: ORAL_TABLET | ORAL | 0 refills | Status: DC

## 2021-12-14 MED ORDER — AMOXICILLIN-POT CLAVULANATE 875-125 MG PO TABS: 1.0000 | ORAL_TABLET | Freq: Two times a day (BID) | ORAL | 0 refills | Status: DC

## 2021-12-14 MED ORDER — BENZONATATE 100 MG PO CAPS: 100.0000 mg | ORAL_CAPSULE | Freq: Three times a day (TID) | ORAL | 0 refills | Status: DC

## 2021-12-14 NOTE — ED Triage Notes (Signed)
Patient here POV from Home.  Endorses Sore Throat, Cough, and Congestion that began 1-2 Weeks PTA. No Fevers at Home.  Given Doxycycline by UC as well as Prednisone. States it feels better at Times.  NAD Noted during Triage. A&Ox4. GCS 15. Ambulatory.

## 2021-12-14 NOTE — Discharge Instructions (Addendum)
I am prescribing you 3 medications.  I am prescribing you an antibiotic called Augmentin.  Please take this twice per day until you have finished the medication.  This medication can cause diarrhea so try to take it with small amount of food to help prevent stomach irritation.  I am also prescribing you a cough medication called Tessalon Perles.  You can take these up to 3 times per day as needed for management of your cough.  Please only take these as prescribed.  Lastly, I will prescribe you a single dose of Diflucan that you can take if you begin developing symptoms consistent with a yeast infection.  Please follow-up with your primary care provider regarding your symptoms as well as this visit today.  Please return to the emergency department with any new or worsening symptoms.

## 2021-12-14 NOTE — ED Provider Notes (Signed)
[facility] DE[facility] Provider Note   CSN: [id number] Arrival date & time: 12/14/21  1820     History  Chief Complaint  Patient presents with   Cough    Ashley Castaneda is a 71 y.o. female.  HPI Patient is a 71 year old female who presents to the emergency department due to cough, sore throat, congestion.  States her symptoms started about 1 to 2 weeks ago.  States that she was seen at urgent care and prescribed doxycycline, prednisone, as well as Best boy.  These medications provided mild short-term improvement but after completing them her symptoms then returned once again.  States that she began taking Mucinex as well as Flonase yesterday which has mildly improved her symptoms as well but is still experiencing a significant productive cough with yellow and green sputum.  No chest pain, shortness of breath, nausea, vomiting, diarrhea.    Home Medications Prior to Admission medications   Medication Sig Start Date End Date Taking? Authorizing Provider  amoxicillin-clavulanate (AUGMENTIN) 875-125 MG tablet Take 1 tablet by mouth every 12 (twelve) hours. 12/14/21  Yes Rayna Sexton, PA-C  benzonatate (TESSALON) 100 MG capsule Take 1 capsule (100 mg total) by mouth every 8 (eight) hours. 12/14/21  Yes Rayna Sexton, PA-C  albuterol (PROAIR HFA) 108 (90 Base) MCG/ACT inhaler Inhale 1-2 puffs into the lungs every 6 (six) hours as needed for wheezing or shortness of breath. 08/28/21   Burchette, Alinda Sierras, MD  ALPRAZolam Duanne Moron) 0.5 MG tablet Take 1 tablet (0.5 mg total) by mouth 2 (two) times daily. 08/31/21   Burchette, Alinda Sierras, MD  aspirin 325 MG tablet daily.    [provider]  calcium carbonate (OS-CAL - DOSED IN MG OF ELEMENTAL CALCIUM) 1250 MG tablet Take 1 tablet by mouth daily.    [provider]  Calcium Citrate-Vitamin D (CAL-CITRATE PLUS VITAMIN D) 250-2.5 MG-MCG TABS Take 2 tablets by mouth 2 (two) times daily.    [provider]   carboxymethylcellulose (REFRESH PLUS) 0.5 % SOLN Take 1 drop by mouth daily as needed. For dry eyes    [provider]  chlorhexidine (PERIDEX) 0.12 % solution chlorhexidine gluconate 0.12 % mouthwash  RINSE WITH 15 MLS BY MOUTH TWICE DAILY AFTER MEALS. SWISH FOR 30 SECONDS THEN SPIT OUT    [provider]  colchicine 0.6 MG tablet Take by mouth. 08/04/20   [provider]  cycloSPORINE (RESTASIS) 0.05 % ophthalmic emulsion Place 1 drop into both eyes 2 (two) times daily.    [provider]  cycloSPORINE (RESTASIS) 0.05 % ophthalmic emulsion Place 1 drop into both eyes every 12 (twelve) hours. 05/07/21   [provider]  dantrolene (DANTRIUM) 25 MG capsule dantrolene 25 mg capsule  Take 1 capsule 4 times a day by oral route as needed for 90 days.    [provider]  dexamethasone 0.5 MG/5ML elixir Take two teaspoons and swish, gargle, and spit qid prn canker sores. 11/25/21   Burchette, Alinda Sierras, MD  dronabinol (MARINOL) 5 MG capsule dronabinol 5 mg capsule  TAKE ONE CAPSULE BY MOUTH TWICE A DAY    [provider]  Eptinezumab-jjmr (VYEPTI IV) Inject into the vein.    [provider]  fluconazole (DIFLUCAN) 150 MG tablet Take 1 tablet (150 mg total) by mouth daily. 12/06/21   Talbot Grumbling, FNP  fluticasone (FLONASE) 50 MCG/ACT nasal spray Place 2 sprays into both nostrils daily as needed. For nasal congestion 08/28/21 08/05/23  Carolann Littler  W, MD  fluticasone furoate-vilanterol (BREO ELLIPTA) 100-25 MCG/ACT AEPB Inhale 1 puff into the lungs daily. 11/25/21   Burchette, Alinda Sierras, MD  frovatriptan (FROVA) 2.5 MG tablet Take 2.5 mg by mouth as needed. If recurs, may repeat after 2 hours. Max of 3 tabs in 24 hours. For migraines.    [provider]  lidocaine (LIDODERM) 5 % Place 1 patch onto the skin daily. Remove & Discard patch within 12 hours or as directed by MD 04/12/18   Eulas Post, MD  meclizine  (ANTIVERT) 25 MG tablet TAKE 1 TABLET BY MOUTH 4 TIMES A DAY AS NEEDED FOR DIZZINESS 04/17/14   Kathrynn Ducking, MD  meloxicam (MOBIC) 15 MG tablet Take 1 tablet (15 mg total) by mouth daily. 08/28/21   Burchette, Alinda Sierras, MD  metaxalone (SKELAXIN) 800 MG tablet Take 800 mg by mouth 3 (three) times daily.    [provider]  Multiple Vitamin (MULTIVITAMIN) tablet Take 1 tablet by mouth every morning.    [provider]  Omega-3 Fatty Acids (FISH OIL) 1200 MG CAPS Take 1 capsule by mouth every evening.    [provider]  potassium chloride (KLOR-CON) 10 MEQ tablet Take two tablets once daily 11/25/21   Burchette, Alinda Sierras, MD  RABEprazole (ACIPHEX) 20 MG tablet Take 1 tablet (20 mg total) by mouth daily. 07/21/20   Burchette, Alinda Sierras, MD  Rimegepant Sulfate (NURTEC) 75 MG TBDP Take by mouth. 10/03/19   [provider]  silodosin (RAPAFLO) 8 MG CAPS capsule Take 1 capsule (8 mg total) by mouth daily with breakfast. 07/22/20   Burchette, Alinda Sierras, MD  tiZANidine (ZANAFLEX) 2 MG tablet Take 2 mg by mouth at bedtime.    [provider]  torsemide (DEMADEX) 10 MG tablet Take 1 tablet (10 mg total) by mouth daily. 08/28/21   Burchette, Alinda Sierras, MD  vitamin E 400 UNIT capsule Take 400 Units by mouth daily at 6 PM.    [provider]      Allergies    Cefazolin, Baclofen, Carbamazepine, Duloxetine, Fentanyl, Gabapentin, Naproxen, Oxcarbazepine, Pregabalin, Serotonin reuptake inhibitors (ssris), Topiramate, Venlafaxine, Zonisamide, Ceclor [cefaclor], Milnacipran, and Ondansetron    Review of Systems   Review of Systems  All other systems reviewed and are negative. Ten systems reviewed and are negative for acute change, except as noted in the HPI.   Physical Exam Updated Vital Signs BP (!) 135/59   Pulse 74   Temp 98 F (36.7 C) (Oral)   Resp 17   Ht 5' 2.75" (1.594 m)   Wt 111 kg   LMP 07/26/1992   SpO2 97%   BMI 43.69 kg/m  Physical Exam Vitals  and nursing note reviewed.  Constitutional:      General: She is not in acute distress.    Appearance: Normal appearance. She is not ill-appearing, toxic-appearing or diaphoretic.  HENT:     Head: Normocephalic and atraumatic.     Right Ear: External ear normal.     Left Ear: External ear normal.     Nose: Nose normal.     Mouth/Throat:     Mouth: Mucous membranes are moist.     Pharynx: Oropharynx is clear. No oropharyngeal exudate or posterior oropharyngeal erythema.     Comments: Uvula midline.  No significant erythema noted in the posterior oropharynx.  No exudates.  Readily handling secretions.  No hot potato voice Eyes:     Extraocular Movements: Extraocular movements intact.  Cardiovascular:  Rate and Rhythm: Normal rate and regular rhythm.     Pulses: Normal pulses.     Heart sounds: Normal heart sounds. No murmur heard.   No friction rub. No gallop.  Pulmonary:     Effort: Pulmonary effort is normal. No respiratory distress.     Breath sounds: Normal breath sounds. No stridor. No wheezing, rhonchi or rales.  Abdominal:     General: Abdomen is flat.     Palpations: Abdomen is soft.     Tenderness: There is no abdominal tenderness.  Musculoskeletal:        General: Normal range of motion.     Cervical back: Normal range of motion and neck supple. No tenderness.  Skin:    General: Skin is warm and dry.  Neurological:     General: No focal deficit present.     Mental Status: She is alert and oriented to person, place, and time.  Psychiatric:        Mood and Affect: Mood normal.        Behavior: Behavior normal.   ED Results / Procedures / Treatments   Labs (all labs ordered are listed, but only abnormal results are displayed) Labs Reviewed  RESP PANEL BY RT-PCR (FLU A&B, COVID) [id number]   EKG None  Radiology DG Chest 2 View  Result Date: 12/14/2021 CLINICAL DATA:  Cough and congestion for 2 weeks despite antibiotics and prednisone. EXAM: CHEST - 2 VIEW  COMPARISON:  02/24/2021 FINDINGS: Normal heart size and pulmonary vascularity. No focal airspace disease or consolidation in the lungs. No blunting of costophrenic angles. No pneumothorax. Mediastinal contours appear intact. Degenerative changes in the spine. IMPRESSION: No active cardiopulmonary disease. Electronically Signed   By: Lucienne Capers M.D.   On: 12/14/2021 19:21    Procedures Procedures   Medications Ordered in ED Medications - No data to display  ED Course/ Medical Decision Making/ A&P                           Medical Decision Making Amount and/or Complexity of Data Reviewed Radiology: ordered.  Risk Prescription drug management.  Pt is a 71 y.o. female who presents to the emergency department due to a productive cough, sore throat, as well as rhinorrhea for the past 2 weeks.  Initially seen in urgent care and prescribed doxycycline, prednisone, as well as Ladona Ridgel which briefly improved her symptoms before they returned once again.  Labs: Respiratory panel is negative.  Imaging: Chest x-ray shows no active cardiopulmonary disease.  I, Rayna Sexton, PA-C, personally reviewed and evaluated these images and lab results as part of my medical decision-making.  On my exam heart is regular rate and rhythm without murmurs, rubs, or gallops.  Lungs are clear to auscultation bilaterally.  Uvula is midline with no erythema or exudates noted in the posterior oropharynx.  Patient afebrile, nontachycardic, and nontoxic-appearing.  Respiratory panel as well as chest x-ray were obtained in triage which both appear reassuring.  Though patient has a reassuring chest x-ray, she does note a history of smoking and is currently having a persistent productive cough.  Due to this will discharge on a course of Augmentin.  We will also discharge on a course of Tessalon Perles.  Patient appears stable for discharge at this time and she is agreeable.  Recommended PCP follow-up.  We  discussed return precautions.  Her questions were answered and she was amicable at the time of discharge.  Note:  Portions of this report may have been transcribed using voice recognition software. Every effort was made to ensure accuracy; however, inadvertent computerized transcription errors may be present.   Final Clinical Impression(s) / ED Diagnoses Final diagnoses:  Productive cough  Sore throat   Rx / DC Orders ED Discharge Orders          Ordered    amoxicillin-clavulanate (AUGMENTIN) 875-125 MG tablet  Every 12 hours        12/14/21 2036    benzonatate (TESSALON) 100 MG capsule  Every 8 hours        12/14/21 2036              Rayna Sexton, PA-C 12/14/21 2042    Wyvonnia Dusky, MD 12/15/21 [phone]

## 2021-12-23 DIAGNOSIS — M47812 Spondylosis without myelopathy or radiculopathy, cervical region: Secondary | ICD-10-CM | POA: Diagnosis not present

## 2022-01-04 DIAGNOSIS — M47812 Spondylosis without myelopathy or radiculopathy, cervical region: Secondary | ICD-10-CM | POA: Diagnosis not present

## 2022-01-06 DIAGNOSIS — G629 Polyneuropathy, unspecified: Secondary | ICD-10-CM | POA: Diagnosis not present

## 2022-01-06 DIAGNOSIS — R5382 Chronic fatigue, unspecified: Secondary | ICD-10-CM | POA: Diagnosis not present

## 2022-01-06 DIAGNOSIS — M791 Myalgia, unspecified site: Secondary | ICD-10-CM | POA: Diagnosis not present

## 2022-01-06 DIAGNOSIS — H04123 Dry eye syndrome of bilateral lacrimal glands: Secondary | ICD-10-CM | POA: Diagnosis not present

## 2022-01-06 DIAGNOSIS — M5481 Occipital neuralgia: Secondary | ICD-10-CM | POA: Diagnosis not present

## 2022-01-06 DIAGNOSIS — M2559 Pain in other specified joint: Secondary | ICD-10-CM | POA: Diagnosis not present

## 2022-01-06 DIAGNOSIS — M2550 Pain in unspecified joint: Secondary | ICD-10-CM | POA: Diagnosis not present

## 2022-01-06 DIAGNOSIS — G90523 Complex regional pain syndrome I of lower limb, bilateral: Secondary | ICD-10-CM | POA: Diagnosis not present

## 2022-01-06 DIAGNOSIS — R682 Dry mouth, unspecified: Secondary | ICD-10-CM | POA: Diagnosis not present

## 2022-01-12 DIAGNOSIS — G43719 Chronic migraine without aura, intractable, without status migrainosus: Secondary | ICD-10-CM | POA: Diagnosis not present

## 2022-01-19 DIAGNOSIS — R42 Dizziness and giddiness: Secondary | ICD-10-CM | POA: Diagnosis not present

## 2022-01-19 DIAGNOSIS — H814 Vertigo of central origin: Secondary | ICD-10-CM | POA: Diagnosis not present

## 2022-01-19 DIAGNOSIS — R2681 Unsteadiness on feet: Secondary | ICD-10-CM | POA: Diagnosis not present

## 2022-01-25 ENCOUNTER — Telehealth: Payer: Self-pay | Admitting: Family Medicine

## 2022-01-25 NOTE — Telephone Encounter (Signed)
Pt is experiencing worsening inner ear damage, balance is still off, extreme pain in eyes, bad headaches since starting therapy last week and is wondering should she stop going to Therapy?  Please advise [phone]

## 2022-01-27 NOTE — Telephone Encounter (Signed)
Patient informed of the message below.  Patient questioned what she should do at this point to stop the migraines and I offered an appt.  Patient declined and stated she will contact her headache doctor.

## 2022-01-29 DIAGNOSIS — R52 Pain, unspecified: Secondary | ICD-10-CM | POA: Diagnosis not present

## 2022-01-29 DIAGNOSIS — M62838 Other muscle spasm: Secondary | ICD-10-CM | POA: Diagnosis not present

## 2022-01-29 DIAGNOSIS — H814 Vertigo of central origin: Secondary | ICD-10-CM | POA: Diagnosis not present

## 2022-02-02 DIAGNOSIS — M47812 Spondylosis without myelopathy or radiculopathy, cervical region: Secondary | ICD-10-CM | POA: Diagnosis not present

## 2022-02-26 ENCOUNTER — Ambulatory Visit (INDEPENDENT_AMBULATORY_CARE_PROVIDER_SITE_OTHER): Payer: Medicare Other | Admitting: Family Medicine

## 2022-02-26 ENCOUNTER — Encounter: Payer: Self-pay | Admitting: Family Medicine

## 2022-02-26 VITALS — BP 120/68 | HR 85 | Temp 97.7°F | Wt 244.5 lb

## 2022-02-26 DIAGNOSIS — F411 Generalized anxiety disorder: Secondary | ICD-10-CM

## 2022-02-26 DIAGNOSIS — K219 Gastro-esophageal reflux disease without esophagitis: Secondary | ICD-10-CM

## 2022-02-26 DIAGNOSIS — R600 Localized edema: Secondary | ICD-10-CM | POA: Diagnosis not present

## 2022-02-26 DIAGNOSIS — R42 Dizziness and giddiness: Secondary | ICD-10-CM

## 2022-02-26 MED ORDER — MELOXICAM 15 MG PO TABS
15.0000 mg | ORAL_TABLET | Freq: Every day | ORAL | 3 refills | Status: DC
Start: 2022-02-26 — End: 2023-06-21

## 2022-02-26 MED ORDER — TORSEMIDE 10 MG PO TABS
10.0000 mg | ORAL_TABLET | Freq: Every day | ORAL | 3 refills | Status: DC
Start: 2022-02-26 — End: 2023-01-04

## 2022-02-26 MED ORDER — ALPRAZOLAM 0.5 MG PO TABS
0.5000 mg | ORAL_TABLET | Freq: Two times a day (BID) | ORAL | 1 refills | Status: DC
Start: 2022-02-26 — End: 2022-07-06

## 2022-02-26 MED ORDER — ALBUTEROL SULFATE HFA 108 (90 BASE) MCG/ACT IN AERS: 1.0000 | INHALATION_SPRAY | Freq: Four times a day (QID) | RESPIRATORY_TRACT | 1 refills | Status: DC | PRN

## 2022-02-26 MED ORDER — PROMETHAZINE HCL 25 MG PO TABS: 25.0000 mg | ORAL_TABLET | Freq: Three times a day (TID) | ORAL | 0 refills | Status: AC | PRN

## 2022-02-26 MED ORDER — RABEPRAZOLE SODIUM 20 MG PO TBEC
20.0000 mg | DELAYED_RELEASE_TABLET | Freq: Every day | ORAL | 3 refills | Status: DC
Start: 2022-02-26 — End: 2022-08-06

## 2022-02-26 MED ORDER — MONTELUKAST SODIUM 10 MG PO TABS: 10.0000 mg | ORAL_TABLET | Freq: Every day | ORAL | 3 refills | Status: DC

## 2022-02-26 NOTE — Progress Notes (Signed)
Established Patient Office Visit  Subjective   Patient ID: Ashley Castaneda, female    DOB: [birth year]  Age: 71 y.o. MRN: [id number]  Chief Complaint  Patient presents with   Follow-up   Dizziness    Patient complains of dizziness, ,x2 months,     HPI   Patient has complex past medical history.  Her major issue is ongoing vestibular issues with dizziness and balance difficulties.  Back in June she went for balance therapy and had just 1 episode of physical therapy and has felt much worse since then.  She actually has pending follow-ups with [facility] neuro-ophthalmology within the next month- to address her dizziness further.   She currently takes meclizine intermittently.  She has intermittent bouts of fairly severe nausea.  Zofran does not help.  Phenergan has helped  She tries to use sparingly and is requesting refills.   Denies any recent fall or head injury.  She also sees neurologist in [place] but they treat her predominantly for headaches.  Her chronic problems include remote history of DVT, complicated migraine headaches, history of bilateral leg edema, trigeminal neuralgia, obesity, hyperlipidemia.  She has history of chronic insomnia and chronic anxiety has been on low-dose alprazolam 0.5 mg twice daily for years.  Requesting 70-monthrefill.  She had severe leg edema a few years ago and has been very stable on torsemide 10 mg daily.  GERD stable on Aciphex 20 mg daily .   Needs refills.   Chronic anxiety stable on Alprazolam 0.5 mg po bid.    Past Medical History:  Diagnosis Date   Adenomatous colon polyp 09/23/2008   ALLERGIC RHINITIS 07/01/2008   ANXIETY 07/01/2008   BACK PAIN, THORACIC REGION 03/04/2010   BRONCHITIS, CHRONIC 07/01/2008   CARPAL TUNNEL SYNDROME, HX OF 07/01/2008   CHRONIC OBSTRUCTIVE PULMONARY DISEASE, ACUTE EXACERBATION 08/07/2010   [place]    DEPRESSION 07/01/2008   DVT, HX OF 07/01/2008    ECZEMA 05/29/2009   FACIAL PAIN 12/25/2009   FIBROMYALGIA 07/01/2008   GERD 07/01/2008   Hiatal hernia    Hyperlipidemia    LEG PAIN, BILATERAL 07/01/2008   MIGRAINES, HX OF 07/01/2008   OCCIPITAL NEURALGIA 07/01/2008   OSTEOARTHRITIS 07/01/2008   Other specified trigeminal nerve disorders 07/01/2008   REFLEX SYMPATHETIC DYSTROPHY 07/01/2008   Trigeminal neuralgia 05/21/2009   Unspecified vitamin D deficiency 03/31/2010   Past Surgical History:  Procedure Laterality Date   CARPAL TUNNEL RELEASE  07/27/2007   RIGHT   EYE SURGERY     HEAD CRANIECTOMY  07/26/1992   MICROVASULAR DECOMPRESSION   LA[facility]     BSO   LEFT KNEE SURGERY  07/27/1987   OCCIPITAL NERVE STIMULATOR INSERTION     RIGHT FOOT BONE SPUR  07/26/1985   RIGHT FOOT SURGERY  07/26/1996   BAD CUT   RIGHT JAW SURGERY  07/26/2000   RIGHT KNEE SURGERY  07/26/1988   RIGHT SOULDER SURGERY  1992, 1993, 1995, 1999   RIGHT THUMB SUGERY  07/26/2002   tear duct surgery     TONSILLECTOMY  07/27/1955    reports that she quit smoking about 10 years ago. Her smoking use included cigarettes. She started smoking about 36 years ago. She has a 60.00 pack-year smoking history. She has been exposed to tobacco smoke. She has never used smokeless tobacco. She reports that she does not drink alcohol and does not use drugs. family history includes Heart attack in her father and  maternal grandmother; Hypertension in her father; Ovarian cancer (age of onset: 12) in her mother; Prostate cancer in her father; Stroke in her paternal grandmother. Allergies  Allergen Reactions   Cefazolin Anaphylaxis, Swelling and Other (See Comments)    Ancef - in surgery, swelled up, turned blue, heart problems Turns Blue   Baclofen Other (See Comments)    REACTION: confusion REACTION: confusion REACTION: confusion REACTION: confusion   Carbamazepine Other (See Comments)    REACTION: confusion Other reaction(s): Confusion Confusion,Bad reactions to all  seizure medicine   Duloxetine Other (See Comments)   Fentanyl Nausea And Vomiting and Other (See Comments)    REACTION: nausea and vomiting   Gabapentin Other (See Comments)    REACTION: confusion   Naproxen Other (See Comments)    Elevates blood pressure   Oxcarbazepine Other (See Comments)    REACTION: confusion Other reaction(s): Other REACTION: confusion REACTION: confusion REACTION: confusion    Pregabalin Other (See Comments)    Other reaction(s): Other   Serotonin Reuptake Inhibitors (Ssris) Other (See Comments)   Topiramate Other (See Comments)    Other reaction(s): Unknown   Venlafaxine Other (See Comments)    Other reaction(s): Unknown   Zonisamide Other (See Comments)    Other reaction(s): Other   Ceclor [Cefaclor]     hives   Milnacipran Other (See Comments)   Ondansetron Nausea And Vomiting    Nausea and vomiting after IV administration    Review of Systems  Constitutional:  Positive for malaise/fatigue. Negative for weight loss.  Respiratory:  Negative for cough and shortness of breath.   Cardiovascular:  Negative for chest pain.  Genitourinary:  Negative for dysuria.  Neurological:  Positive for dizziness.      Objective:     BP 120/68 (BP Location: Left Arm, Patient Position: Sitting, Cuff Size: Large)   Pulse 85   Temp 97.7 F (36.5 C) (Oral)   Wt 244 lb 8 oz (110.9 kg)   LMP 07/26/1992   SpO2 97%   BMI 43.66 kg/m  BP Readings from Last 3 Encounters:  02/26/22 120/68  12/14/21 140/80  12/06/21 130/81   Wt Readings from Last 3 Encounters:  02/26/22 244 lb 8 oz (110.9 kg)  12/14/21 244 lb 11.4 oz (111 kg)  12/06/21 244 lb 12.9 oz (111 kg)      Physical Exam Vitals reviewed.  Cardiovascular:     Rate and Rhythm: Normal rate and regular rhythm.  Pulmonary:     Effort: Pulmonary effort is normal.     Breath sounds: Normal breath sounds. No wheezing or rales.  Musculoskeletal:     Right lower leg: No edema.     Left lower leg: No  edema.  Neurological:     General: No focal deficit present.      No results found for any visits on 02/26/22.  Last CBC Lab Results  Component Value Date   WBC 12.3 (H) 08/28/2021   HGB 12.6 08/28/2021   HCT 38.5 08/28/2021   MCV 92.2 08/28/2021   MCH 31.0 05/06/2019   RDW 12.2 08/28/2021   PLT 377.0 56/31/4970   Last metabolic panel Lab Results  Component Value Date   GLUCOSE 101 (H) 09/18/2021   NA 137 09/18/2021   K 4.0 09/18/2021   CL 102 09/18/2021   CO2 26 09/18/2021   BUN 19 09/18/2021   CREATININE 0.99 09/18/2021   GFRNONAA >60 09/18/2021   CALCIUM 8.9 09/18/2021   PROT 7.6 08/28/2021   ALBUMIN 4.8 08/28/2021  BILITOT 0.4 08/28/2021   ALKPHOS 82 08/28/2021   AST 18 08/28/2021   ALT 26 08/28/2021   ANIONGAP 9 09/18/2021   Last lipids Lab Results  Component Value Date   CHOL 235 (H) 07/21/2020   HDL 59.50 07/21/2020   LDLCALC 144 (H) 07/21/2020   LDLDIRECT 149.8 06/14/2013   TRIG 159.0 (H) 07/21/2020   CHOLHDL 4 07/21/2020   Last hemoglobin A1c Lab Results  Component Value Date   HGBA1C 5.3 11/25/2021   Last thyroid functions Lab Results  Component Value Date   TSH 2.33 10/18/2016      The 10-year ASCVD risk score (Arnett DK, et al., 2019) is: 21.1%    Assessment & Plan:   #1 history of chronic dizziness and vestibular dysfunction.  She has seen multiple specialist regarding this in the past.  She has scheduled follow-up with [facility] and also with a neuro-ophthalmologist.  She recently had some balance training and felt like symptoms were worse after that.  She does have frequent associated nausea.  She requested Phenergan which is helped in the past but we discussed possible side effects including sedation.  Wrote for limited Phenergan No. 20 to use sparingly for severe nausea  #2 chronic bilateral leg edema currently stable.  Continue torsemide 10 mg daily  #3 GERD- stable.  Refilled Aciphex for one year.   #4 chronic anxiety.   She  has, for several years, been on low dose alprazolam and we refilled for 6 months.  She is aware of potential side effects.    No follow-ups on file.    Carolann Littler, MD

## 2022-03-04 DIAGNOSIS — Z9841 Cataract extraction status, right eye: Secondary | ICD-10-CM | POA: Diagnosis not present

## 2022-03-04 DIAGNOSIS — H04123 Dry eye syndrome of bilateral lacrimal glands: Secondary | ICD-10-CM | POA: Diagnosis not present

## 2022-03-04 DIAGNOSIS — H11002 Unspecified pterygium of left eye: Secondary | ICD-10-CM | POA: Diagnosis not present

## 2022-03-04 DIAGNOSIS — H11823 Conjunctivochalasis, bilateral: Secondary | ICD-10-CM | POA: Diagnosis not present

## 2022-03-04 DIAGNOSIS — H0100A Unspecified blepharitis right eye, upper and lower eyelids: Secondary | ICD-10-CM | POA: Diagnosis not present

## 2022-03-04 DIAGNOSIS — H2512 Age-related nuclear cataract, left eye: Secondary | ICD-10-CM | POA: Diagnosis not present

## 2022-03-04 DIAGNOSIS — G514 Facial myokymia: Secondary | ICD-10-CM | POA: Diagnosis not present

## 2022-03-04 DIAGNOSIS — H0289 Other specified disorders of eyelid: Secondary | ICD-10-CM | POA: Diagnosis not present

## 2022-03-04 DIAGNOSIS — H0100B Unspecified blepharitis left eye, upper and lower eyelids: Secondary | ICD-10-CM | POA: Diagnosis not present

## 2022-03-04 DIAGNOSIS — H0259 Other disorders affecting eyelid function: Secondary | ICD-10-CM | POA: Diagnosis not present

## 2022-03-04 DIAGNOSIS — H5509 Other forms of nystagmus: Secondary | ICD-10-CM | POA: Diagnosis not present

## 2022-03-04 DIAGNOSIS — H5500 Unspecified nystagmus: Secondary | ICD-10-CM | POA: Diagnosis not present

## 2022-03-04 DIAGNOSIS — Z961 Presence of intraocular lens: Secondary | ICD-10-CM | POA: Diagnosis not present

## 2022-03-04 DIAGNOSIS — H11442 Conjunctival cysts, left eye: Secondary | ICD-10-CM | POA: Diagnosis not present

## 2022-03-08 DIAGNOSIS — M47812 Spondylosis without myelopathy or radiculopathy, cervical region: Secondary | ICD-10-CM | POA: Diagnosis not present

## 2022-03-11 DIAGNOSIS — G8929 Other chronic pain: Secondary | ICD-10-CM | POA: Diagnosis not present

## 2022-03-11 DIAGNOSIS — M5481 Occipital neuralgia: Secondary | ICD-10-CM | POA: Diagnosis not present

## 2022-03-11 DIAGNOSIS — R519 Headache, unspecified: Secondary | ICD-10-CM | POA: Diagnosis not present

## 2022-03-11 DIAGNOSIS — R42 Dizziness and giddiness: Secondary | ICD-10-CM | POA: Diagnosis not present

## 2022-03-11 DIAGNOSIS — H9042 Sensorineural hearing loss, unilateral, left ear, with unrestricted hearing on the contralateral side: Secondary | ICD-10-CM | POA: Diagnosis not present

## 2022-03-11 DIAGNOSIS — G43909 Migraine, unspecified, not intractable, without status migrainosus: Secondary | ICD-10-CM | POA: Diagnosis not present

## 2022-04-02 DIAGNOSIS — H9042 Sensorineural hearing loss, unilateral, left ear, with unrestricted hearing on the contralateral side: Secondary | ICD-10-CM | POA: Diagnosis not present

## 2022-04-02 DIAGNOSIS — G43909 Migraine, unspecified, not intractable, without status migrainosus: Secondary | ICD-10-CM | POA: Diagnosis not present

## 2022-04-02 DIAGNOSIS — R42 Dizziness and giddiness: Secondary | ICD-10-CM | POA: Diagnosis not present

## 2022-04-07 DIAGNOSIS — H9042 Sensorineural hearing loss, unilateral, left ear, with unrestricted hearing on the contralateral side: Secondary | ICD-10-CM | POA: Diagnosis not present

## 2022-04-07 DIAGNOSIS — R42 Dizziness and giddiness: Secondary | ICD-10-CM | POA: Diagnosis not present

## 2022-04-07 DIAGNOSIS — H832X2 Labyrinthine dysfunction, left ear: Secondary | ICD-10-CM | POA: Diagnosis not present

## 2022-04-07 DIAGNOSIS — H9312 Tinnitus, left ear: Secondary | ICD-10-CM | POA: Diagnosis not present

## 2022-04-12 ENCOUNTER — Ambulatory Visit
Admission: EM | Admit: 2022-04-12 | Discharge: 2022-04-12 | Disposition: A | Discharge status: Home / Self Care | Payer: Medicare Other | Attending: Family Medicine | Admitting: Family Medicine

## 2022-04-12 ENCOUNTER — Encounter: Payer: Self-pay | Admitting: *Deleted

## 2022-04-12 ENCOUNTER — Telehealth: Payer: Self-pay

## 2022-04-12 DIAGNOSIS — L739 Follicular disorder, unspecified: Secondary | ICD-10-CM | POA: Diagnosis not present

## 2022-04-12 MED ORDER — DOXYCYCLINE HYCLATE 100 MG PO CAPS: 100.0000 mg | ORAL_CAPSULE | Freq: Two times a day (BID) | ORAL | 0 refills | Status: DC

## 2022-04-12 MED ORDER — MUPIROCIN 2 % EX OINT: 1.0000 | TOPICAL_OINTMENT | Freq: Two times a day (BID) | CUTANEOUS | 0 refills | Status: DC

## 2022-04-12 NOTE — ED Triage Notes (Signed)
Pt states she had some water test for dizziness last Thursday. Since she has been having some right side nasal swelling and pain. She has tried to drain with a needle and using saline.

## 2022-04-12 NOTE — Discharge Instructions (Signed)
Apply the mupirocin with a Q-tip to the inside of the right nostril until your symptoms resolve and take the full course of antibiotics.  Follow-up closely with your primary care provider in the next few days to make sure your symptoms are improving.  Follow up sooner if you are worsening at any time.

## 2022-04-12 NOTE — ED Provider Notes (Signed)
[facility]    CSN: [id number] Arrival date & time: 04/12/22  1834      History   Chief Complaint Chief Complaint  Patient presents with   Facial Swelling    HPI Ashley Castaneda is a 71 y.o. female.   Patient presenting today with 1 day history of right nostril swelling, redness, pressure extending to the right cheek, pain.  States yesterday she noticed a white bump at the base of her nostril and picked at it and symptoms have worsened since then.  She denies fever, chills, drainage, bleeding, difficulty breathing.  Has been applying warm compresses to the area with minimal relief.    Past Medical History:  Diagnosis Date   Adenomatous colon polyp 09/23/2008   ALLERGIC RHINITIS 07/01/2008   ANXIETY 07/01/2008   BACK PAIN, THORACIC REGION 03/04/2010   BRONCHITIS, CHRONIC 07/01/2008   CARPAL TUNNEL SYNDROME, HX OF 07/01/2008   CHRONIC OBSTRUCTIVE PULMONARY DISEASE, ACUTE EXACERBATION 08/07/2010   COPD (chronic obstructive pulmonary disease) ([place])    DEPRESSION 07/01/2008   DVT, HX OF 07/01/2008   ECZEMA 05/29/2009   FACIAL PAIN 12/25/2009   FIBROMYALGIA 07/01/2008   GERD 07/01/2008   Hiatal hernia    Hyperlipidemia    LEG PAIN, BILATERAL 07/01/2008   MIGRAINES, HX OF 07/01/2008   OCCIPITAL NEURALGIA 07/01/2008   OSTEOARTHRITIS 07/01/2008   Other specified trigeminal nerve disorders 07/01/2008   REFLEX SYMPATHETIC DYSTROPHY 07/01/2008   Trigeminal neuralgia 05/21/2009   Unspecified vitamin D deficiency 03/31/2010    Patient Active Problem List   Diagnosis Date Noted   Chronic rhinitis 10/13/2021   Chronic obstructive pulmonary disease ([place]) 10/13/2021   Encounter for Medicare annual wellness exam 07/07/2020   Status post right cataract extraction 04/10/2020   DOE (dyspnea on exertion) 10/17/2019   Recurrent aphthous ulcer 03/27/2019   Chronic bilateral low back pain without sciatica 02/09/2019   Pain in surgical scar 08/01/2018   Bilateral leg  edema 09/28/2017   Infected surgical wound 09/14/2017   Status post insertion of spinal cord stimulator 09/14/2017   Obesity 10/08/2015   Migraine headache 01/18/2014   Dizziness and giddiness 01/18/2014   Numbness and tingling of both legs 01/18/2014   Severe obesity (BMI >= 40) (H[facility]) 06/14/2013   Numbness and tingling in right hand 05/30/2013   Hyperlipidemia 06/06/2012   Fatigue 06/06/2012   Dvt femoral (deep venous thrombosis) (H[facility]) 10/28/2011   CHRONIC OBSTRUCTIVE PULMONARY DISEASE, ACUTE EXACERBATION 08/07/2010   UNSPECIFIED VITAMIN D DEFICIENCY 03/31/2010   BACK PAIN, THORACIC REGION 03/04/2010   ECZEMA 05/29/2009   TRIGEMINAL NEURALGIA 05/21/2009   Anxiety state 07/01/2008   DEPRESSION 07/01/2008   REFLEX SYMPATHETIC DYSTROPHY 07/01/2008   OTHER SPECIFIED TRIGEMINAL NERVE DISORDERS 07/01/2008   Allergic rhinitis 07/01/2008   BRONCHITIS, CHRONIC 07/01/2008   GERD 07/01/2008   OSTEOARTHRITIS 07/01/2008   OCCIPITAL NEURALGIA 07/01/2008   FIBROMYALGIA 07/01/2008   CARPAL TUNNEL SYNDROME, HX OF 07/01/2008   MIGRAINES, HX OF 07/01/2008    Past Surgical History:  Procedure Laterality Date   CARPAL TUNNEL RELEASE  07/27/2007   RIGHT   EYE SURGERY     HEAD CRANIECTOMY  07/26/1992   MICROVASULAR DECOMPRESSION   [facility]     BSO   LEFT KNEE SURGERY  07/27/1987   OCCIPITAL NERVE STIMULATOR INSERTION     RIGHT FOOT BONE SPUR  07/26/1985   RIGHT FOOT SURGERY  07/26/1996   BAD CUT   RIGHT JAW SURGERY  07/26/2000   RIGHT KNEE SURGERY  07/26/1988  [place]   RIGHT THUMB SUGERY  07/26/2002   tear duct surgery     TONSILLECTOMY  07/27/1955    OB History     Gravida  0   Para      Term      Preterm      AB      Living         SAB      IAB      Ectopic      Multiple      Live Births               Home Medications    Prior to Admission medications   Medication Sig Start Date End Date Taking? Authorizing  Provider  albuterol (PROAIR HFA) 108 (90 Base) MCG/ACT inhaler Inhale 1-2 puffs into the lungs every 6 (six) hours as needed for wheezing or shortness of breath. 02/26/22  Yes Burchette, Alinda Sierras, MD  ALPRAZolam Duanne Moron) 0.5 MG tablet Take 1 tablet (0.5 mg total) by mouth 2 (two) times daily. 02/26/22  Yes Burchette, Alinda Sierras, MD  aspirin 325 MG tablet daily.   Yes [provider]  benzonatate (TESSALON) 100 MG capsule Take 1 capsule (100 mg total) by mouth every 8 (eight) hours. 12/14/21  Yes Rayna Sexton, PA-C  calcium carbonate (OS-CAL - DOSED IN MG OF ELEMENTAL CALCIUM) 1250 MG tablet Take 1 tablet by mouth daily.   Yes [provider]  Calcium Citrate-Vitamin D (CAL-CITRATE PLUS VITAMIN D) 250-2.5 MG-MCG TABS Take 2 tablets by mouth 2 (two) times daily.   Yes [provider]  carboxymethylcellulose (REFRESH PLUS) 0.5 % SOLN Take 1 drop by mouth daily as needed. For dry eyes   Yes [provider]  chlorhexidine (PERIDEX) 0.12 % solution chlorhexidine gluconate 0.12 % mouthwash  RINSE WITH 15 MLS BY MOUTH TWICE DAILY AFTER MEALS. SWISH FOR 30 SECONDS THEN SPIT OUT   Yes [provider]  colchicine 0.6 MG tablet Take by mouth. 08/04/20  Yes [provider]  cycloSPORINE (RESTASIS) 0.05 % ophthalmic emulsion Place 1 drop into both eyes 2 (two) times daily.   Yes [provider]  cycloSPORINE (RESTASIS) 0.05 % ophthalmic emulsion Place 1 drop into both eyes every 12 (twelve) hours. 05/07/21  Yes [provider]  dantrolene (DANTRIUM) 25 MG capsule dantrolene 25 mg capsule  Take 1 capsule 4 times a day by oral route as needed for 90 days.   Yes [provider]  dexamethasone 0.5 MG/5ML elixir Take two teaspoons and swish, gargle, and spit qid prn canker sores. 11/25/21  Yes Burchette, Alinda Sierras, MD  doxycycline (VIBRAMYCIN) 100 MG capsule Take 1 capsule (100 mg total) by mouth 2 (two) times daily. 04/12/22  Yes Volney American, PA-C  dronabinol (MARINOL) 5 MG capsule dronabinol 5 mg capsule  TAKE ONE CAPSULE BY MOUTH TWICE A DAY   Yes [provider]  Eptinezumab-jjmr (VYEPTI IV) Inject into the vein.   Yes [provider]  fluconazole (DIFLUCAN) 150 MG tablet You can take a single dose of flucanazole if you develop vaginal irritation while taking your antibiotics. 12/14/21  Yes Rayna Sexton, PA-C  fluticasone (FLONASE) 50 MCG/ACT nasal spray Place 2 sprays into both nostrils daily as needed. For nasal congestion 08/28/21 08/05/23 Yes Burchette, Alinda Sierras, MD  fluticasone furoate-vilanterol (BREO ELLIPTA) 100-25 MCG/ACT AEPB Inhale 1 puff into the lungs daily. 11/25/21  Yes Burchette, Alinda Sierras, MD  frovatriptan (FROVA) 2.5 MG tablet Take 2.5 mg by mouth as needed. If recurs, may repeat after 2 hours. Max of 3 tabs in 24 hours. For migraines.   Yes [provider]  lidocaine (LIDODERM) 5 % Place 1 patch onto the skin daily. Remove & Discard patch within 12 hours or as directed by MD 04/12/18  Yes Burchette, Alinda Sierras, MD  meclizine (ANTIVERT) 25 MG tablet TAKE 1 TABLET BY MOUTH 4 TIMES A DAY AS NEEDED FOR DIZZINESS 04/17/14  Yes Kathrynn Ducking, MD  meloxicam (MOBIC) 15 MG tablet Take 1 tablet (15 mg total) by mouth daily. 02/26/22  Yes Burchette, Alinda Sierras, MD  metaxalone (SKELAXIN) 800 MG tablet Take 800 mg by mouth 3 (three) times daily.   Yes [provider]  montelukast (SINGULAIR) 10 MG tablet Take 1 tablet (10 mg total) by mouth at bedtime. 02/26/22  Yes Burchette, Alinda Sierras, MD  Multiple Vitamin (MULTIVITAMIN) tablet Take 1 tablet by mouth every morning.   Yes [provider]  mupirocin ointment (BACTROBAN) 2 % Apply 1 Application topically 2 (two) times daily. 04/12/22  Yes Volney American, PA-C  Omega-3 Fatty Acids (FISH OIL) 1200 MG CAPS Take 1 capsule by mouth every evening.   Yes [provider]  potassium chloride (KLOR-CON) 10 MEQ tablet Take two tablets  once daily 11/25/21  Yes Burchette, Alinda Sierras, MD  promethazine (PHENERGAN) 25 MG tablet Take 1 tablet (25 mg total) by mouth every 8 (eight) hours as needed for nausea or vomiting. 02/26/22  Yes Burchette, Alinda Sierras, MD  RABEprazole (ACIPHEX) 20 MG tablet Take 1 tablet (20 mg total) by mouth daily. 02/26/22  Yes Burchette, Alinda Sierras, MD  Rimegepant Sulfate (NURTEC) 75 MG TBDP Take by mouth. 10/03/19  Yes [provider]  silodosin (RAPAFLO) 8 MG CAPS capsule Take 1 capsule (8 mg total) by mouth daily with breakfast. 07/22/20  Yes Burchette, Alinda Sierras, MD  tiZANidine (ZANAFLEX) 2 MG tablet Take 2 mg by mouth at bedtime.   Yes [provider]  torsemide (DEMADEX) 10 MG tablet Take 1 tablet (10 mg total) by mouth daily. 02/26/22  Yes Burchette, Alinda Sierras, MD  vitamin E 400 UNIT capsule Take 400 Units by mouth daily at 6 PM.   Yes [provider]    Family History Family History  Problem Relation Age of Onset   Ovarian cancer Mother 10   Hypertension Father    Prostate cancer Father    Heart attack Father    Heart attack Maternal Grandmother    Stroke Paternal Grandmother     Social History Social History   Tobacco Use   Smoking status: Former    Packs/day: 2.00    Years: 30.00    Total pack years: 60.00    Types: Cigarettes    Start date: 21    Quit date: 02/04/2012    Years since quitting: 10.1    Passive exposure: Current   Smokeless tobacco: Never   Tobacco comments:    nicotine in vapor and is very low nicotine   Vaping Use   Vaping Use: Every day  Substance Use Topics   Alcohol use: No    Alcohol/week: 0.0 standard drinks of alcohol    Comment: quit 2000   Drug use: No     Allergies   Cefazolin, Baclofen, Carbamazepine, Duloxetine, Fentanyl, Gabapentin, Naproxen, Oxcarbazepine, Pregabalin, Serotonin reuptake inhibitors (ssris), Topiramate, Venlafaxine, Zonisamide, Ceclor [cefaclor], Milnacipran, and Ondansetron   Review of Systems Review of  Systems Per  HPI  Physical Exam Triage Vital Signs ED Triage Vitals  Enc Vitals Group     BP 04/12/22 1847 (!) 82/49     Pulse Rate 04/12/22 1847 77     Resp 04/12/22 1847 20     Temp 04/12/22 1847 98.4 F (36.9 C)     Temp Source 04/12/22 1847 Oral     SpO2 04/12/22 1847 95 %     Weight --      Height --      Head Circumference --      Peak Flow --      Pain Score 04/12/22 1845 7     Pain Loc --      Pain Edu? --      Excl. in [place]? --    No data found.  Updated Vital Signs BP 136/67   Pulse 75   Temp 98.4 F (36.9 C) (Oral)   Resp 20   LMP 07/26/1992   SpO2 97%   Visual Acuity Right Eye Distance:   Left Eye Distance:   Bilateral Distance:    Right Eye Near:   Left Eye Near:    Bilateral Near:     Physical Exam Vitals and nursing note reviewed.  Constitutional:      Appearance: Normal appearance. She is not ill-appearing.  HENT:     Head: Atraumatic.     Nose:     Comments: Right lateral nasal fold erythematous, edematous with a firm nodular lesion to the inner portion of the nare, significantly tender to palpation.  No drainage or bleeding, nonfluctuant    Mouth/Throat:     Mouth: Mucous membranes are moist.     Pharynx: Oropharynx is clear.  Eyes:     Extraocular Movements: Extraocular movements intact.     Conjunctiva/sclera: Conjunctivae normal.  Cardiovascular:     Rate and Rhythm: Normal rate and regular rhythm.     Heart sounds: Normal heart sounds.  Pulmonary:     Effort: Pulmonary effort is normal.     Breath sounds: Normal breath sounds.  Musculoskeletal:        General: Normal range of motion.     Cervical back: Normal range of motion and neck supple.  Skin:    General: Skin is warm and dry.  Neurological:     Mental Status: She is alert and oriented to person, place, and time.  Psychiatric:        Mood and Affect: Mood normal.        Thought Content: Thought content normal.        Judgment: Judgment normal.      UC Treatments / Results   Labs (all labs ordered are listed, but only abnormal results are displayed) Labs Reviewed - No data to display  EKG   Radiology No results found.  Procedures Procedures (including critical care time)  Medications Ordered in UC Medications - No data to display  Initial Impression / Assessment and Plan / UC Course  I have reviewed the triage vital signs and the nursing notes.  Pertinent labs & imaging results that were available during my care of the patient were reviewed by me and considered in my medical decision making (see chart for details).     We will treat for infection in the nasal cavity with mupirocin ointment topically, doxycycline.  Discussed over-the-counter pain relievers, close monitoring and follow-up with PCP.  Final Clinical Impressions(s) / UC Diagnoses   Final diagnoses:  Nasal folliculitis  Discharge Instructions      Apply the mupirocin with a Q-tip to the inside of the right nostril until your symptoms resolve and take the full course of antibiotics.  Follow-up closely with your primary care provider in the next few days to make sure your symptoms are improving.  Follow up sooner if you are worsening at any time.    ED Prescriptions     Medication Sig Dispense Auth. Provider   doxycycline (VIBRAMYCIN) 100 MG capsule Take 1 capsule (100 mg total) by mouth 2 (two) times daily. 14 capsule Volney American, [place]   mupirocin ointment (BACTROBAN) 2 % Apply 1 Application topically 2 (two) times daily. 22 g Volney American, [place]      PDMP not reviewed this encounter.   Volney American, [place] 04/12/22 1941

## 2022-04-13 DIAGNOSIS — J340 Abscess, furuncle and carbuncle of nose: Secondary | ICD-10-CM | POA: Diagnosis not present

## 2022-04-15 DIAGNOSIS — H9042 Sensorineural hearing loss, unilateral, left ear, with unrestricted hearing on the contralateral side: Secondary | ICD-10-CM | POA: Diagnosis not present

## 2022-04-15 DIAGNOSIS — M5481 Occipital neuralgia: Secondary | ICD-10-CM | POA: Diagnosis not present

## 2022-04-15 DIAGNOSIS — H832X2 Labyrinthine dysfunction, left ear: Secondary | ICD-10-CM | POA: Diagnosis not present

## 2022-04-15 DIAGNOSIS — G43909 Migraine, unspecified, not intractable, without status migrainosus: Secondary | ICD-10-CM | POA: Diagnosis not present

## 2022-04-15 DIAGNOSIS — R42 Dizziness and giddiness: Secondary | ICD-10-CM | POA: Diagnosis not present

## 2022-04-27 DIAGNOSIS — G43719 Chronic migraine without aura, intractable, without status migrainosus: Secondary | ICD-10-CM | POA: Diagnosis not present

## 2022-05-17 DIAGNOSIS — H04123 Dry eye syndrome of bilateral lacrimal glands: Secondary | ICD-10-CM | POA: Diagnosis not present

## 2022-05-17 DIAGNOSIS — G509 Disorder of trigeminal nerve, unspecified: Secondary | ICD-10-CM | POA: Diagnosis not present

## 2022-05-17 DIAGNOSIS — G4701 Insomnia due to medical condition: Secondary | ICD-10-CM | POA: Diagnosis not present

## 2022-05-17 DIAGNOSIS — G4486 Cervicogenic headache: Secondary | ICD-10-CM | POA: Diagnosis not present

## 2022-05-17 DIAGNOSIS — B0229 Other postherpetic nervous system involvement: Secondary | ICD-10-CM | POA: Diagnosis not present

## 2022-05-17 DIAGNOSIS — R52 Pain, unspecified: Secondary | ICD-10-CM | POA: Diagnosis not present

## 2022-05-17 DIAGNOSIS — G8929 Other chronic pain: Secondary | ICD-10-CM | POA: Diagnosis not present

## 2022-05-17 DIAGNOSIS — G43119 Migraine with aura, intractable, without status migrainosus: Secondary | ICD-10-CM | POA: Diagnosis not present

## 2022-05-17 DIAGNOSIS — G43719 Chronic migraine without aura, intractable, without status migrainosus: Secondary | ICD-10-CM | POA: Diagnosis not present

## 2022-05-17 DIAGNOSIS — G9050 Complex regional pain syndrome I, unspecified: Secondary | ICD-10-CM | POA: Diagnosis not present

## 2022-05-17 DIAGNOSIS — G932 Benign intracranial hypertension: Secondary | ICD-10-CM | POA: Diagnosis not present

## 2022-05-17 DIAGNOSIS — M797 Fibromyalgia: Secondary | ICD-10-CM | POA: Diagnosis not present

## 2022-06-09 DIAGNOSIS — H04123 Dry eye syndrome of bilateral lacrimal glands: Secondary | ICD-10-CM | POA: Diagnosis not present

## 2022-06-09 DIAGNOSIS — K120 Recurrent oral aphthae: Secondary | ICD-10-CM | POA: Diagnosis not present

## 2022-06-09 DIAGNOSIS — G514 Facial myokymia: Secondary | ICD-10-CM | POA: Diagnosis not present

## 2022-06-09 DIAGNOSIS — Z961 Presence of intraocular lens: Secondary | ICD-10-CM | POA: Diagnosis not present

## 2022-06-11 DIAGNOSIS — Z1231 Encounter for screening mammogram for malignant neoplasm of breast: Secondary | ICD-10-CM | POA: Diagnosis not present

## 2022-06-14 ENCOUNTER — Encounter: Payer: Self-pay | Admitting: Family Medicine

## 2022-06-27 ENCOUNTER — Emergency Department (HOSPITAL_BASED_OUTPATIENT_CLINIC_OR_DEPARTMENT_OTHER)
Admission: EM | Admit: 2022-06-27 | Discharge: 2022-06-27 | Disposition: A | Discharge status: Home / Self Care | Payer: Medicare Other | Attending: Emergency Medicine | Admitting: Emergency Medicine

## 2022-06-27 ENCOUNTER — Encounter (HOSPITAL_BASED_OUTPATIENT_CLINIC_OR_DEPARTMENT_OTHER): Payer: Self-pay | Admitting: Emergency Medicine

## 2022-06-27 DIAGNOSIS — T7840XA Allergy, unspecified, initial encounter: Secondary | ICD-10-CM | POA: Insufficient documentation

## 2022-06-27 DIAGNOSIS — R21 Rash and other nonspecific skin eruption: Secondary | ICD-10-CM | POA: Diagnosis not present

## 2022-06-27 DIAGNOSIS — J449 Chronic obstructive pulmonary disease, unspecified: Secondary | ICD-10-CM | POA: Insufficient documentation

## 2022-06-27 DIAGNOSIS — Z7951 Long term (current) use of inhaled steroids: Secondary | ICD-10-CM | POA: Diagnosis not present

## 2022-06-27 DIAGNOSIS — Z7982 Long term (current) use of aspirin: Secondary | ICD-10-CM | POA: Insufficient documentation

## 2022-06-27 MED ORDER — PREDNISONE 20 MG PO TABS
40.0000 mg | ORAL_TABLET | Freq: Once | ORAL | Status: AC
Start: 2022-06-27 — End: 2022-06-27
  Administered 2022-06-27: 40 mg via ORAL
  Filled 2022-06-27: qty 2

## 2022-06-27 MED ORDER — PREDNISONE 20 MG PO TABS: 40.0000 mg | ORAL_TABLET | Freq: Every day | ORAL | 0 refills | Status: DC

## 2022-06-27 NOTE — ED Triage Notes (Signed)
Pt here from home with c/o allergic reaction to her migraine  medicine , she thinks it may be Emgality in combination with Vyepti

## 2022-06-27 NOTE — ED Provider Notes (Signed)
[facility] Provider Note   CSN: [id number] Arrival date & time: 06/27/22  1505     History  Chief Complaint  Patient presents with   Allergic Reaction    Ashley Castaneda is a 71 y.o. female.  Patient is a 71 year old female with a history of chronic headaches with occipital neuralgia and trigeminal neuralgia who currently gets infusions every 3 months for her migraine headaches also with a history of recurrent mouth ulcers and COPD who is presenting today due to concern for allergic reaction.  Patient has been on Vyepti every 3 months for approximately a year but it supposed to last for 3 months but she starts getting breakthrough pain 2 months then.  So on Friday she started a new medication which she has had in the past but never while she has been on this other medication called Emgality.  She injected the dose herself on Friday and started noticing yesterday she was itching on her arms and chest.  When she woke up this morning she noticed a rash and the itching is getting worse.  She did try to take an expired Benadryl last night which did not help.  She had hydroxyzine leftover which she took today but still has itching.  She reports that she feels like her lips feel a bit raw and itchy but is not having any trouble swallowing or breathing.  The history is provided by the patient.  Allergic Reaction      Home Medications Prior to Admission medications   Medication Sig Start Date End Date Taking? Authorizing Provider  predniSONE (DELTASONE) 20 MG tablet Take 2 tablets (40 mg total) by mouth daily. 06/27/22  Yes Louetta Hollingshead, Loree Fee, MD  albuterol (PROAIR HFA) 108 (90 Base) MCG/ACT inhaler Inhale 1-2 puffs into the lungs every 6 (six) hours as needed for wheezing or shortness of breath. 02/26/22   Burchette, Alinda Sierras, MD  ALPRAZolam Duanne Moron) 0.5 MG tablet Take 1 tablet (0.5 mg total) by mouth 2 (two) times daily. 02/26/22   Burchette, Alinda Sierras, MD  aspirin 325 MG  tablet daily.    [provider]  benzonatate (TESSALON) 100 MG capsule Take 1 capsule (100 mg total) by mouth every 8 (eight) hours. 12/14/21   Rayna Sexton, PA-C  calcium carbonate (OS-CAL - DOSED IN MG OF ELEMENTAL CALCIUM) 1250 MG tablet Take 1 tablet by mouth daily.    [provider]  Calcium Citrate-Vitamin D (CAL-CITRATE PLUS VITAMIN D) 250-2.5 MG-MCG TABS Take 2 tablets by mouth 2 (two) times daily.    [provider]  carboxymethylcellulose (REFRESH PLUS) 0.5 % SOLN Take 1 drop by mouth daily as needed. For dry eyes    [provider]  chlorhexidine (PERIDEX) 0.12 % solution chlorhexidine gluconate 0.12 % mouthwash  RINSE WITH 15 MLS BY MOUTH TWICE DAILY AFTER MEALS. SWISH FOR 30 SECONDS THEN SPIT OUT    [provider]  colchicine 0.6 MG tablet Take by mouth. 08/04/20   [provider]  cycloSPORINE (RESTASIS) 0.05 % ophthalmic emulsion Place 1 drop into both eyes 2 (two) times daily.    [provider]  cycloSPORINE (RESTASIS) 0.05 % ophthalmic emulsion Place 1 drop into both eyes every 12 (twelve) hours. 05/07/21   [provider]  dantrolene (DANTRIUM) 25 MG capsule dantrolene 25 mg capsule  Take 1 capsule 4 times a day by oral route as needed for 90 days.    [provider]  dexamethasone 0.5 MG/5ML elixir Take two  teaspoons and swish, gargle, and spit qid prn canker sores. 11/25/21   Burchette, Alinda Sierras, MD  doxycycline (VIBRAMYCIN) 100 MG capsule Take 1 capsule (100 mg total) by mouth 2 (two) times daily. 04/12/22   Volney American, PA-C  dronabinol (MARINOL) 5 MG capsule dronabinol 5 mg capsule  TAKE ONE CAPSULE BY MOUTH TWICE A DAY    [provider]  Eptinezumab-jjmr (VYEPTI IV) Inject into the vein.    [provider]  fluconazole (DIFLUCAN) 150 MG tablet You can take a single dose of flucanazole if you develop vaginal irritation while taking your antibiotics. 12/14/21    Rayna Sexton, PA-C  fluticasone (FLONASE) 50 MCG/ACT nasal spray Place 2 sprays into both nostrils daily as needed. For nasal congestion 08/28/21 08/05/23  Burchette, Alinda Sierras, MD  fluticasone furoate-vilanterol (BREO ELLIPTA) 100-25 MCG/ACT AEPB Inhale 1 puff into the lungs daily. 11/25/21   Burchette, Alinda Sierras, MD  frovatriptan (FROVA) 2.5 MG tablet Take 2.5 mg by mouth as needed. If recurs, may repeat after 2 hours. Max of 3 tabs in 24 hours. For migraines.    [provider]  lidocaine (LIDODERM) 5 % Place 1 patch onto the skin daily. Remove & Discard patch within 12 hours or as directed by MD 04/12/18   Eulas Post, MD  meclizine (ANTIVERT) 25 MG tablet TAKE 1 TABLET BY MOUTH 4 TIMES A DAY AS NEEDED FOR DIZZINESS 04/17/14   Kathrynn Ducking, MD  meloxicam (MOBIC) 15 MG tablet Take 1 tablet (15 mg total) by mouth daily. 02/26/22   Burchette, Alinda Sierras, MD  metaxalone (SKELAXIN) 800 MG tablet Take 800 mg by mouth 3 (three) times daily.    [provider]  montelukast (SINGULAIR) 10 MG tablet Take 1 tablet (10 mg total) by mouth at bedtime. 02/26/22   Burchette, Alinda Sierras, MD  Multiple Vitamin (MULTIVITAMIN) tablet Take 1 tablet by mouth every morning.    [provider]  mupirocin ointment (BACTROBAN) 2 % Apply 1 Application topically 2 (two) times daily. 04/12/22   Volney American, PA-C  Omega-3 Fatty Acids (FISH OIL) 1200 MG CAPS Take 1 capsule by mouth every evening.    [provider]  potassium chloride (KLOR-CON) 10 MEQ tablet Take two tablets once daily 11/25/21   Burchette, Alinda Sierras, MD  promethazine (PHENERGAN) 25 MG tablet Take 1 tablet (25 mg total) by mouth every 8 (eight) hours as needed for nausea or vomiting. 02/26/22   Burchette, Alinda Sierras, MD  RABEprazole (ACIPHEX) 20 MG tablet Take 1 tablet (20 mg total) by mouth daily. 02/26/22   Burchette, Alinda Sierras, MD  Rimegepant Sulfate (NURTEC) 75 MG TBDP Take by mouth. 10/03/19   [provider]  silodosin  (RAPAFLO) 8 MG CAPS capsule Take 1 capsule (8 mg total) by mouth daily with breakfast. 07/22/20   Burchette, Alinda Sierras, MD  tiZANidine (ZANAFLEX) 2 MG tablet Take 2 mg by mouth at bedtime.    [provider]  torsemide (DEMADEX) 10 MG tablet Take 1 tablet (10 mg total) by mouth daily. 02/26/22   Burchette, Alinda Sierras, MD  vitamin E 400 UNIT capsule Take 400 Units by mouth daily at 6 PM.    [provider]      Allergies    Cefazolin, Baclofen, Carbamazepine, Duloxetine, Fentanyl, Gabapentin, Naproxen, Oxcarbazepine, Pregabalin, Serotonin reuptake inhibitors (ssris), Topiramate, Venlafaxine, Zonisamide, Ceclor [cefaclor], Milnacipran, and Ondansetron    Review of Systems   Review of Systems  Physical Exam Updated Vital  Signs BP (!) 142/94 (BP Location: Right Arm)   Pulse 73   Temp 98.3 F (36.8 C) (Oral)   Resp 18   Ht '5\' 3"'$  (1.6 m)   Wt 108.9 kg   LMP 07/26/1992   SpO2 98%   BMI 42.51 kg/m  Physical Exam Vitals and nursing note reviewed.  Constitutional:      General: She is in acute distress.     Appearance: She is well-developed.  HENT:     Head: Normocephalic and atraumatic.     Mouth/Throat:     Comments: No swelling of the tongue or lips.  Small ulcerative lesion at the back of the left upper gums which patient is reported has been there for 3 weeks Eyes:     Pupils: Pupils are equal, round, and reactive to light.  Cardiovascular:     Rate and Rhythm: Normal rate and regular rhythm.     Heart sounds: Normal heart sounds. No murmur heard.    No friction rub.  Pulmonary:     Effort: Pulmonary effort is normal.     Breath sounds: Normal breath sounds. No wheezing or rales.  Abdominal:     General: Bowel sounds are normal. There is no distension.     Palpations: Abdomen is soft.     Tenderness: There is no abdominal tenderness. There is no guarding or rebound.  Musculoskeletal:        General: No tenderness. Normal range of motion.     Comments: No edema   Skin:    General: Skin is warm and dry.     Findings: Rash present.     Comments: Papular rash noted over the chest, over the top of the abdomen a little bit on the extensor surfaces of the bilateral upper extremities.  Excoriation noted.  No vesicles or open lesions noted  Neurological:     Mental Status: She is alert and oriented to person, place, and time.     Cranial Nerves: No cranial nerve deficit.  Psychiatric:        Mood and Affect: Mood normal.        Behavior: Behavior normal.     ED Results / Procedures / Treatments   Labs (all labs ordered are listed, but only abnormal results are displayed) Labs Reviewed - No data to display  EKG None  Radiology No results found.  Procedures Procedures    Medications Ordered in ED Medications  predniSONE (DELTASONE) tablet 40 mg (has no administration in time range)    ED Course/ Medical Decision Making/ A&P                           Medical Decision Making Risk Prescription drug management.   Pt with multiple medical problems and comorbidities and presenting today with a complaint that caries a high risk for morbidity and mortality.  Here today with symptoms of an allergic reaction.  Most likely from the [facility] that she injected on Friday as symptoms started the next day.  She denies any other new exposures.  No airway involvement at this time.  She is taking Zyrtec and has tried hydroxyzine at home with persistent symptoms.  Will start steroids.  Patient given return precautions.  She will follow-up with her headache specialist on Monday.         Final Clinical Impression(s) / ED Diagnoses Final diagnoses:  Allergic reaction, initial encounter    Rx / DC Orders ED Discharge  Orders          Ordered    predniSONE (DELTASONE) 20 MG tablet  Daily        06/27/22 1657              Blanchie Dessert, MD 06/27/22 1658

## 2022-06-27 NOTE — Discharge Instructions (Signed)
It appears that you are most likely having an allergic reaction to the Emgality.  In addition to taking the steroids once a day you can continue taking the Zyrtec.  Also it is okay to take 20 mg of hydroxyzine for the itching.  Benadryl spray or 50 mg of Benadryl you can also use for itching if you do not want to use the hydroxyzine.  If you start having inability to breathe or swallow or your mouth is swelling you should return to the emergency room immediately.  If the steroids work but once you get off of them you start having recurrent symptoms you should talk with your doctor as you may have to be on steroids longer until the medication is out of your system.

## 2022-06-29 ENCOUNTER — Other Ambulatory Visit: Payer: Self-pay | Admitting: Family Medicine

## 2022-06-29 ENCOUNTER — Encounter: Payer: Self-pay | Admitting: Family Medicine

## 2022-06-29 MED ORDER — POTASSIUM CHLORIDE ER 10 MEQ PO TBCR: EXTENDED_RELEASE_TABLET | ORAL | 0 refills | Status: DC

## 2022-06-29 MED ORDER — FLUTICASONE FUROATE-VILANTEROL 100-25 MCG/ACT IN AEPB: 1.0000 | INHALATION_SPRAY | Freq: Every day | RESPIRATORY_TRACT | 0 refills | Status: DC

## 2022-06-30 ENCOUNTER — Other Ambulatory Visit (HOSPITAL_COMMUNITY): Payer: Self-pay

## 2022-06-30 MED ORDER — CAL-CITRATE PLUS VITAMIN D 250-2.5 MG-MCG PO TABS
2.0000 | ORAL_TABLET | Freq: Two times a day (BID) | ORAL | 3 refills | Status: AC
  Filled 2022-06-30: qty 120, 30d supply, fill #0
  Filled 2022-07-15: qty 120, fill #0

## 2022-07-06 MED ORDER — ALPRAZOLAM 0.5 MG PO TABS: 0.5000 mg | ORAL_TABLET | Freq: Two times a day (BID) | ORAL | 1 refills | Status: DC

## 2022-07-06 NOTE — Telephone Encounter (Signed)
Faxed to [facility]

## 2022-07-07 ENCOUNTER — Telehealth: Payer: Self-pay | Admitting: Family Medicine

## 2022-07-07 NOTE — Telephone Encounter (Signed)
Patient requesting something to help with the hives and itching she is experiencing. Was treated at [facility] ED, declined OV

## 2022-07-07 NOTE — Telephone Encounter (Signed)
Patient informed of the message and expressed understanding

## 2022-07-15 ENCOUNTER — Emergency Department (HOSPITAL_BASED_OUTPATIENT_CLINIC_OR_DEPARTMENT_OTHER): Payer: Medicare Other | Admitting: Radiology

## 2022-07-15 ENCOUNTER — Other Ambulatory Visit: Payer: Self-pay

## 2022-07-15 ENCOUNTER — Emergency Department (HOSPITAL_BASED_OUTPATIENT_CLINIC_OR_DEPARTMENT_OTHER)
Admission: EM | Admit: 2022-07-15 | Discharge: 2022-07-15 | Disposition: A | Discharge status: Home / Self Care | Payer: Medicare Other | Attending: Emergency Medicine | Admitting: Emergency Medicine

## 2022-07-15 DIAGNOSIS — Z7982 Long term (current) use of aspirin: Secondary | ICD-10-CM | POA: Insufficient documentation

## 2022-07-15 DIAGNOSIS — R0600 Dyspnea, unspecified: Secondary | ICD-10-CM | POA: Diagnosis not present

## 2022-07-15 DIAGNOSIS — Z1152 Encounter for screening for COVID-19: Secondary | ICD-10-CM | POA: Insufficient documentation

## 2022-07-15 DIAGNOSIS — R0602 Shortness of breath: Secondary | ICD-10-CM | POA: Diagnosis not present

## 2022-07-15 DIAGNOSIS — Z87891 Personal history of nicotine dependence: Secondary | ICD-10-CM | POA: Insufficient documentation

## 2022-07-15 DIAGNOSIS — R600 Localized edema: Secondary | ICD-10-CM | POA: Insufficient documentation

## 2022-07-15 DIAGNOSIS — R079 Chest pain, unspecified: Secondary | ICD-10-CM | POA: Diagnosis not present

## 2022-07-15 DIAGNOSIS — J449 Chronic obstructive pulmonary disease, unspecified: Secondary | ICD-10-CM | POA: Diagnosis not present

## 2022-07-15 LAB — CBC
HCT: 41.3 % (ref 36.0–46.0)
Hemoglobin: 13.3 g/dL (ref 12.0–15.0)
MCH: 29.8 pg (ref 26.0–34.0)
MCHC: 32.2 g/dL (ref 30.0–36.0)
MCV: 92.4 fL (ref 80.0–100.0)
Platelets: 332 10*3/uL (ref 150–400)
RBC: 4.47 MIL/uL (ref 3.87–5.11)
RDW: 13.5 % (ref 11.5–15.5)
WBC: 11 10*3/uL — ABNORMAL HIGH (ref 4.0–10.5)
nRBC: 0 % (ref 0.0–0.2)

## 2022-07-15 LAB — BASIC METABOLIC PANEL
Anion gap: 12 (ref 5–15)
BUN: 20 mg/dL (ref 8–23)
CO2: 29 mmol/L (ref 22–32)
Calcium: 9.4 mg/dL (ref 8.9–10.3)
Chloride: 98 mmol/L (ref 98–111)
Creatinine, Ser: 1.17 mg/dL — ABNORMAL HIGH (ref 0.44–1.00)
GFR, Estimated: 50 mL/min — ABNORMAL LOW (ref >60)
Glucose, Bld: 89 mg/dL (ref 70–99)
Potassium: 4 mmol/L (ref 3.5–5.1)
Sodium: 139 mmol/L (ref 135–145)

## 2022-07-15 LAB — RESP PANEL BY RT-PCR (RSV, FLU A&B, COVID)  RVPGX2
Influenza A by PCR: NEGATIVE
Influenza B by PCR: NEGATIVE
Resp Syncytial Virus by PCR: NEGATIVE
SARS Coronavirus 2 by RT PCR: NEGATIVE

## 2022-07-15 LAB — BRAIN NATRIURETIC PEPTIDE: B Natriuretic Peptide: 38.5 pg/mL (ref 0.0–100.0)

## 2022-07-15 LAB — D-DIMER, QUANTITATIVE: D-Dimer, Quant: 0.43 ug/mL-FEU (ref 0.00–0.50)

## 2022-07-15 LAB — TROPONIN I (HIGH SENSITIVITY)
Troponin I (High Sensitivity): <2 ng/L (ref <18)
Troponin I (High Sensitivity): <2 ng/L (ref <18)

## 2022-07-15 MED ORDER — ALBUTEROL SULFATE HFA 108 (90 BASE) MCG/ACT IN AERS: 2.0000 | INHALATION_SPRAY | RESPIRATORY_TRACT | Status: DC | PRN

## 2022-07-15 NOTE — ED Triage Notes (Signed)
Pt arrived POV, caox4, ambulatory. Pt reports she was treated here early December for allergic reaction and was prescribed steroid to take at home, but states she stopped taking it 3 days because she couldn't sleep. Pt c/o SOB and CP since early December. SOB worsens on exertion. Pt reports swelling in legs bilateral. Hx COPD and DVT. RT Eval in triage.

## 2022-07-15 NOTE — ED Provider Notes (Signed)
[facility] EMERGENCY DEPT Provider Note   CSN: [id number] Arrival date & time: 07/15/22  1418     History  Chief Complaint  Patient presents with   Shortness of Breath    Ashley Castaneda is a 71 y.o. female.   Shortness of Breath Patient presents with shortness of breath and cough.  More trouble sleeping due to trouble breathing.  Also trouble walking.  Back in the beginning December was diagnosed with allergic reaction.  Had no migraine injection and had shortness of breath.  Given 8 days of steroids.  Then had return of symptoms after finishing that up.  Started back on steroids by neurologist.  Only did 3 days because after that can feel more swelling in her legs and was unable to sleep.  Feeling more short of breath.  Has a history of swelling in her legs in the past.  History of chronic pain in legs also history of DVTs.  States she had it previously and is only on aspirin now.    Past Medical History:  Diagnosis Date   Adenomatous colon polyp 09/23/2008   ALLERGIC RHINITIS 07/01/2008   ANXIETY 07/01/2008   BACK PAIN, THORACIC REGION 03/04/2010   BRONCHITIS, CHRONIC 07/01/2008   CARPAL TUNNEL SYNDROME, HX OF 07/01/2008   CHRONIC OBSTRUCTIVE PULMONARY DISEASE, ACUTE EXACERBATION 08/07/2010   COPD (chronic obstructive pulmonary disease) ([place])    DEPRESSION 07/01/2008   DVT, HX OF 07/01/2008   ECZEMA 05/29/2009   FACIAL PAIN 12/25/2009   FIBROMYALGIA 07/01/2008   GERD 07/01/2008   Hiatal hernia    Hyperlipidemia    LEG PAIN, BILATERAL 07/01/2008   MIGRAINES, HX OF 07/01/2008   OCCIPITAL NEURALGIA 07/01/2008   OSTEOARTHRITIS 07/01/2008   Other specified trigeminal nerve disorders 07/01/2008   REFLEX SYMPATHETIC DYSTROPHY 07/01/2008   Trigeminal neuralgia 05/21/2009   Unspecified vitamin D deficiency 03/31/2010    Home Medications Prior to Admission medications   Medication Sig Start Date End Date Taking? Authorizing Provider  albuterol (PROAIR HFA)  108 (90 Base) MCG/ACT inhaler Inhale 1-2 puffs into the lungs every 6 (six) hours as needed for wheezing or shortness of breath. 02/26/22   Burchette, Alinda Sierras, MD  ALPRAZolam Duanne Moron) 0.5 MG tablet Take 1 tablet (0.5 mg total) by mouth 2 (two) times daily. 07/06/22   Burchette, Alinda Sierras, MD  aspirin 325 MG tablet daily.    [provider]  benzonatate (TESSALON) 100 MG capsule Take 1 capsule (100 mg total) by mouth every 8 (eight) hours. 12/14/21   Rayna Sexton, PA-C  calcium carbonate (OS-CAL - DOSED IN MG OF ELEMENTAL CALCIUM) 1250 MG tablet Take 1 tablet by mouth daily.    [provider]  Calcium Citrate-Vitamin D (CAL-CITRATE PLUS VITAMIN D) 250-2.5 MG-MCG TABS Take 2 tablets by mouth 2 (two) times daily. 06/30/22   Burchette, Alinda Sierras, MD  carboxymethylcellulose (REFRESH PLUS) 0.5 % SOLN Take 1 drop by mouth daily as needed. For dry eyes    [provider]  chlorhexidine (PERIDEX) 0.12 % solution chlorhexidine gluconate 0.12 % mouthwash  RINSE WITH 15 MLS BY MOUTH TWICE DAILY AFTER MEALS. SWISH FOR 30 SECONDS THEN SPIT OUT    [provider]  colchicine 0.6 MG tablet Take by mouth. 08/04/20   [provider]  cycloSPORINE (RESTASIS) 0.05 % ophthalmic emulsion Place 1 drop into both eyes 2 (two) times daily.    [provider]  cycloSPORINE (RESTASIS) 0.05 % ophthalmic emulsion Place 1 drop into both eyes every 12 (  twelve) hours. 05/07/21   [provider]  dantrolene (DANTRIUM) 25 MG capsule dantrolene 25 mg capsule  Take 1 capsule 4 times a day by oral route as needed for 90 days.    [provider]  dexamethasone 0.5 MG/5ML elixir Take two teaspoons and swish, gargle, and spit qid prn canker sores. 11/25/21   Burchette, Alinda Sierras, MD  doxycycline (VIBRAMYCIN) 100 MG capsule Take 1 capsule (100 mg total) by mouth 2 (two) times daily. 04/12/22   Volney American, PA-C  dronabinol (MARINOL) 5 MG capsule dronabinol 5 mg  capsule  TAKE ONE CAPSULE BY MOUTH TWICE A DAY    [provider]  Eptinezumab-jjmr (VYEPTI IV) Inject into the vein.    [provider]  fluconazole (DIFLUCAN) 150 MG tablet You can take a single dose of flucanazole if you develop vaginal irritation while taking your antibiotics. 12/14/21   Rayna Sexton, PA-C  fluticasone (FLONASE) 50 MCG/ACT nasal spray Place 2 sprays into both nostrils daily as needed. For nasal congestion 08/28/21 08/05/23  Burchette, Alinda Sierras, MD  fluticasone furoate-vilanterol (BREO ELLIPTA) 100-25 MCG/ACT AEPB Inhale 1 puff into the lungs daily. 06/29/22   Burchette, Alinda Sierras, MD  frovatriptan (FROVA) 2.5 MG tablet Take 2.5 mg by mouth as needed. If recurs, may repeat after 2 hours. Max of 3 tabs in 24 hours. For migraines.    [provider]  lidocaine (LIDODERM) 5 % Place 1 patch onto the skin daily. Remove & Discard patch within 12 hours or as directed by MD 04/12/18   Eulas Post, MD  meclizine (ANTIVERT) 25 MG tablet TAKE 1 TABLET BY MOUTH 4 TIMES A DAY AS NEEDED FOR DIZZINESS 04/17/14   Kathrynn Ducking, MD  meloxicam (MOBIC) 15 MG tablet Take 1 tablet (15 mg total) by mouth daily. 02/26/22   Burchette, Alinda Sierras, MD  metaxalone (SKELAXIN) 800 MG tablet Take 800 mg by mouth 3 (three) times daily.    [provider]  montelukast (SINGULAIR) 10 MG tablet Take 1 tablet (10 mg total) by mouth at bedtime. 02/26/22   Burchette, Alinda Sierras, MD  Multiple Vitamin (MULTIVITAMIN) tablet Take 1 tablet by mouth every morning.    [provider]  mupirocin ointment (BACTROBAN) 2 % Apply 1 Application topically 2 (two) times daily. 04/12/22   Volney American, PA-C  Omega-3 Fatty Acids (FISH OIL) 1200 MG CAPS Take 1 capsule by mouth every evening.    [provider]  potassium chloride (KLOR-CON) 10 MEQ tablet Take two tablets once daily 06/29/22   Burchette, Alinda Sierras, MD  predniSONE (DELTASONE) 20 MG tablet Take 2 tablets (40 mg  total) by mouth daily. 06/27/22   Blanchie Dessert, MD  promethazine (PHENERGAN) 25 MG tablet Take 1 tablet (25 mg total) by mouth every 8 (eight) hours as needed for nausea or vomiting. 02/26/22   Burchette, Alinda Sierras, MD  RABEprazole (ACIPHEX) 20 MG tablet Take 1 tablet (20 mg total) by mouth daily. 02/26/22   Burchette, Alinda Sierras, MD  Rimegepant Sulfate (NURTEC) 75 MG TBDP Take by mouth. 10/03/19   [provider]  silodosin (RAPAFLO) 8 MG CAPS capsule Take 1 capsule (8 mg total) by mouth daily with breakfast. 07/22/20   Burchette, Alinda Sierras, MD  tiZANidine (ZANAFLEX) 2 MG tablet Take 2 mg by mouth at bedtime.    [provider]  torsemide (DEMADEX) 10 MG tablet Take 1 tablet (10 mg total) by mouth daily. 02/26/22   Burchette, Alinda Sierras,  MD  vitamin E 400 UNIT capsule Take 400 Units by mouth daily at 6 PM.    [provider]      Allergies    Cefazolin, Baclofen, Carbamazepine, Duloxetine, Fentanyl, Gabapentin, Naproxen, Oxcarbazepine, Pregabalin, Serotonin reuptake inhibitors (ssris), Topiramate, Venlafaxine, Zonisamide, Ceclor [cefaclor], Milnacipran, and Ondansetron    Review of Systems   Review of Systems  Respiratory:  Positive for shortness of breath.     Physical Exam Updated Vital Signs BP (!) 130/50   Pulse 71   Temp 98.5 F (36.9 C) (Oral)   Resp (!) 21   Ht '5\' 3"'$  (1.6 m)   Wt 112.2 kg   LMP 07/26/1992   SpO2 100%   BMI 43.82 kg/m  Physical Exam Vitals and nursing note reviewed.  HENT:     Head: Normocephalic.  Cardiovascular:     Rate and Rhythm: Normal rate and regular rhythm.  Pulmonary:     Comments: Mildly harsh breath sounds at left base. Chest:     Chest wall: No tenderness.  Musculoskeletal:     Right lower leg: Edema present.     Left lower leg: Edema present.  Skin:    General: Skin is warm.     Capillary Refill: Capillary refill takes less than 2 seconds.  Neurological:     Mental Status: She is alert.     ED Results / Procedures  / Treatments   Labs (all labs ordered are listed, but only abnormal results are displayed) Labs Reviewed  BASIC METABOLIC PANEL - Abnormal; Notable for the following components:      Result Value   Creatinine, Ser 1.17 (*)    GFR, Estimated 50 (*)    All other components within normal limits  CBC - Abnormal; Notable for the following components:   WBC 11.0 (*)    All other components within normal limits  RESP PANEL BY RT-PCR (RSV, FLU A&B, COVID)  RVPGX2  BRAIN NATRIURETIC PEPTIDE  D-DIMER, QUANTITATIVE  TROPONIN I (HIGH SENSITIVITY)  TROPONIN I (HIGH SENSITIVITY)    EKG EKG Interpretation  Date/Time:  Thursday July 15 2022 14:29:06 EST Ventricular Rate:  78 PR Interval:  120 QRS Duration: 88 QT Interval:  374 QTC Calculation: 426 R Axis:   48 Text Interpretation: Normal sinus rhythm Normal ECG When compared with ECG of 26-Feb-2021 14:26, No significant change was found Confirmed by Davonna Belling [phone]) on 07/15/2022 3:24:46 PM  Radiology DG Chest 2 View  Result Date: 07/15/2022 CLINICAL DATA:  Shortness of breath, chest pain EXAM: CHEST - 2 VIEW COMPARISON:  12/14/2021 FINDINGS: The heart size and mediastinal contours are within normal limits. Both lungs are clear. Disc degenerative disease of the thoracic spine. IMPRESSION: No acute abnormality of the lungs. Electronically Signed   By: Delanna Ahmadi M.D.   On: 07/15/2022 15:06    Procedures Procedures    Medications Ordered in ED Medications  albuterol (VENTOLIN HFA) 108 (90 Base) MCG/ACT inhaler 2 puff (has no administration in time range)    ED Course/ Medical Decision Making/ A&P                           Medical Decision Making Amount and/or Complexity of Data Reviewed Labs: ordered. Radiology: ordered.  Risk Prescription drug management.   Patient with shortness of breath over around 3 weeks now.  Began after allergic reaction.  Does have some increased swelling on her legs.  Had been on  steroids.  Is a former smoker but still vapes.  History of COPD.  States she thinks this feels different.  Differential diagnosis includes pneumonia bronchitis COPD PE.  Will get D-dimer to evaluate for because a history of blood clots.  Will also get BNP.  Chest x-ray independently interpreted showed no pneumonia or CHF.  D-dimer and BNP normal.  Doubt CHF or PE.  Sats remain above 96% with ambulating.  Potentially could be related to my either medications she had or her steroids.  Appears stable for discharge however with outpatient follow-up.        Final Clinical Impression(s) / ED Diagnoses Final diagnoses:  Dyspnea, unspecified type    Rx / DC Orders ED Discharge Orders     None         Davonna Belling, MD 07/15/22 1827

## 2022-07-15 NOTE — ED Notes (Signed)
This RN calls lab to add on d-dimer. Sample is available.

## 2022-07-15 NOTE — ED Notes (Signed)
RT note: Pt. seen while in Triage -1 along with RN, Lavone Orn, RT assessment information obtained, to be charted ASAP due to pt. Acuity.

## 2022-07-15 NOTE — ED Notes (Signed)
Pt verbalized understanding of d/c instructions, meds, and followup care. Denies questions. VSS, no distress noted. Steady gait to exit with all belongings.  ?

## 2022-07-15 NOTE — ED Notes (Signed)
Ambulates without assistance. O2 remains >96% while ambulating.

## 2022-07-16 ENCOUNTER — Other Ambulatory Visit (HOSPITAL_COMMUNITY): Payer: Self-pay

## 2022-07-20 ENCOUNTER — Ambulatory Visit (INDEPENDENT_AMBULATORY_CARE_PROVIDER_SITE_OTHER): Payer: Medicare Other

## 2022-07-20 VITALS — Ht 62.75 in | Wt 244.0 lb

## 2022-07-20 DIAGNOSIS — Z0000 Encounter for general adult medical examination without abnormal findings: Secondary | ICD-10-CM | POA: Diagnosis not present

## 2022-07-20 NOTE — Patient Instructions (Addendum)
Ashley Castaneda , Thank you for taking time to come for your Medicare Wellness Visit. I appreciate your ongoing commitment to your health goals. Please review the following plan we discussed and let me know if I can assist you in the future.   These are the goals we discussed:  Goals       Exercise 3x per week (30 min per time)      Stay Alive (pt-stated)      Weight (lb) < 200 lb (90.7 kg)      Will continue your weight loss program.          This is a list of the screening recommended for you and due dates:  Health Maintenance  Topic Date Due   DTaP/Tdap/Td vaccine (2 - Tdap) 03/31/2020   COVID-19 Vaccine (6 - 2023-24 season) 08/05/2022*   Zoster (Shingles) Vaccine (2 of 2) 10/19/2022*   Screening for Lung Cancer  07/21/2023*   Hepatitis C Screening: USPSTF Recommendation to screen - Ages 18-79 yo.  07/21/2023*   Mammogram  06/12/2023   Medicare Annual Wellness Visit  07/21/2023   Colon Cancer Screening  02/05/2025   Pneumonia Vaccine  Completed   Flu Shot  Completed   DEXA scan (bone density measurement)  Completed   HPV Vaccine  Aged Out  *Topic was postponed. The date shown is not the original due date.    Advanced directives: Advance directive discussed with you today. Even though you declined this today, please call our office should you change your mind, and we can give you the proper paperwork for you to fill out.   Conditions/risks identified: None  Next appointment: Follow up in one year for your annual wellness visit     Preventive Care 65 Years and Older, Female Preventive care refers to lifestyle choices and visits with your health care provider that can promote health and wellness. What does preventive care include? A yearly physical exam. This is also called an annual well check. Dental exams once or twice a year. Routine eye exams. Ask your health care provider how often you should have your eyes checked. Personal lifestyle choices, including: Daily care of  your teeth and gums. Regular physical activity. Eating a healthy diet. Avoiding tobacco and drug use. Limiting alcohol use. Practicing safe sex. Taking low-dose aspirin every day. Taking vitamin and mineral supplements as recommended by your health care provider. What happens during an annual well check? The services and screenings done by your health care provider during your annual well check will depend on your age, overall health, lifestyle risk factors, and family history of disease. Counseling  Your health care provider may ask you questions about your: Alcohol use. Tobacco use. Drug use. Emotional well-being. Home and relationship well-being. Sexual activity. Eating habits. History of falls. Memory and ability to understand (cognition). Work and work Statistician. Reproductive health. Screening  You may have the following tests or measurements: Height, weight, and BMI. Blood pressure. Lipid and cholesterol levels. These may be checked every 5 years, or more frequently if you are over 21 years old. Skin check. Lung cancer screening. You may have this screening every year starting at age 97 if you have a 30-pack-year history of smoking and currently smoke or have quit within the past 15 years. Fecal occult blood test (FOBT) of the stool. You may have this test every year starting at age 77. Flexible sigmoidoscopy or colonoscopy. You may have a sigmoidoscopy every 5 years or a colonoscopy every 10 years  starting at age 69. Hepatitis C blood test. Hepatitis B blood test. Sexually transmitted disease (STD) testing. Diabetes screening. This is done by checking your blood sugar (glucose) after you have not eaten for a while (fasting). You may have this done every 1-3 years. Bone density scan. This is done to screen for osteoporosis. You may have this done starting at age 66. Mammogram. This may be done every 1-2 years. Talk to your health care provider about how often you should  have regular mammograms. Talk with your health care provider about your test results, treatment options, and if necessary, the need for more tests. Vaccines  Your health care provider may recommend certain vaccines, such as: Influenza vaccine. This is recommended every year. Tetanus, diphtheria, and acellular pertussis (Tdap, Td) vaccine. You may need a Td booster every 10 years. Zoster vaccine. You may need this after age 33. Pneumococcal 13-valent conjugate (PCV13) vaccine. One dose is recommended after age 47. Pneumococcal polysaccharide (PPSV23) vaccine. One dose is recommended after age 34. Talk to your health care provider about which screenings and vaccines you need and how often you need them. This information is not intended to replace advice given to you by your health care provider. Make sure you discuss any questions you have with your health care provider. Document Released: 08/08/2015 Document Revised: 03/31/2016 Document Reviewed: 05/13/2015 Elsevier Interactive Patient Education  2017 [place] Prevention in the Home Falls can cause injuries. They can happen to people of all ages. There are many things you can do to make your home safe and to help prevent falls. What can I do on the outside of my home? Regularly fix the edges of walkways and driveways and fix any cracks. Remove anything that might make you trip as you walk through a door, such as a raised step or threshold. Trim any bushes or trees on the path to your home. Use bright outdoor lighting. Clear any walking paths of anything that might make someone trip, such as rocks or tools. Regularly check to see if handrails are loose or broken. Make sure that both sides of any steps have handrails. Any raised decks and porches should have guardrails on the edges. Have any leaves, snow, or ice cleared regularly. Use sand or salt on walking paths during winter. Clean up any spills in your garage right away. This  includes oil or grease spills. What can I do in the bathroom? Use night lights. Install grab bars by the toilet and in the tub and shower. Do not use towel bars as grab bars. Use non-skid mats or decals in the tub or shower. If you need to sit down in the shower, use a plastic, non-slip stool. Keep the floor dry. Clean up any water that spills on the floor as soon as it happens. Remove soap buildup in the tub or shower regularly. Attach bath mats securely with double-sided non-slip rug tape. Do not have throw rugs and other things on the floor that can make you trip. What can I do in the bedroom? Use night lights. Make sure that you have a light by your bed that is easy to reach. Do not use any sheets or blankets that are too big for your bed. They should not hang down onto the floor. Have a firm chair that has side arms. You can use this for support while you get dressed. Do not have throw rugs and other things on the floor that can make you trip. What  can I do in the kitchen? Clean up any spills right away. Avoid walking on wet floors. Keep items that you use a lot in easy-to-reach places. If you need to reach something above you, use a strong step stool that has a grab bar. Keep electrical cords out of the way. Do not use floor polish or wax that makes floors slippery. If you must use wax, use non-skid floor wax. Do not have throw rugs and other things on the floor that can make you trip. What can I do with my stairs? Do not leave any items on the stairs. Make sure that there are handrails on both sides of the stairs and use them. Fix handrails that are broken or loose. Make sure that handrails are as long as the stairways. Check any carpeting to make sure that it is firmly attached to the stairs. Fix any carpet that is loose or worn. Avoid having throw rugs at the top or bottom of the stairs. If you do have throw rugs, attach them to the floor with carpet tape. Make sure that you  have a light switch at the top of the stairs and the bottom of the stairs. If you do not have them, ask someone to add them for you. What else can I do to help prevent falls? Wear shoes that: Do not have high heels. Have rubber bottoms. Are comfortable and fit you well. Are closed at the toe. Do not wear sandals. If you use a stepladder: Make sure that it is fully opened. Do not climb a closed stepladder. Make sure that both sides of the stepladder are locked into place. Ask someone to hold it for you, if possible. Clearly mark and make sure that you can see: Any grab bars or handrails. First and last steps. Where the edge of each step is. Use tools that help you move around (mobility aids) if they are needed. These include: Canes. Walkers. Scooters. Crutches. Turn on the lights when you go into a dark area. Replace any light bulbs as soon as they burn out. Set up your furniture so you have a clear path. Avoid moving your furniture around. If any of your floors are uneven, fix them. If there are any pets around you, be aware of where they are. Review your medicines with your doctor. Some medicines can make you feel dizzy. This can increase your chance of falling. Ask your doctor what other things that you can do to help prevent falls. This information is not intended to replace advice given to you by your health care provider. Make sure you discuss any questions you have with your health care provider. Document Released: 05/08/2009 Document Revised: 12/18/2015 Document Reviewed: 08/16/2014 Elsevier Interactive Patient Education  2017 [employer].

## 2022-07-20 NOTE — Progress Notes (Signed)
Subjective:   Ashley Castaneda is a 71 y.o. female who presents for Medicare Annual (Subsequent) preventive examination.  Review of Systems    Virtual Visit via Telephone Note  I connected with  Ashley Castaneda on 07/20/22 at  3:00 PM EST by telephone and verified that I am speaking with the correct person using two identifiers.  Location: Patient: Home Provider: Office Persons participating in the virtual visit: patient/Nurse Health Advisor   I discussed the limitations, risks, security and privacy concerns of performing an evaluation and management service by telephone and the availability of in person appointments. The patient expressed understanding and agreed to proceed.  Interactive audio and video telecommunications were attempted between this nurse and patient, however failed, due to patient having technical difficulties OR patient did not have access to video capability.  We continued and completed visit with audio only.  Some vital signs may be absent or patient reported.   Criselda Peaches, LPN  Cardiac Risk Factors include: advanced age (>51mn, >>82women);Other (see comment), Risk factor comments: Dx COPD     Objective:    Today's Vitals   07/20/22 1459 07/20/22 1500  Weight: 244 lb (110.7 kg)   Height: 5' 2.75" (1.594 m)   PainSc:  8    Body mass index is 43.57 kg/m.     07/20/2022    3:25 PM 07/15/2022    3:32 PM 12/14/2021    6:33 PM 09/18/2021    3:08 PM 07/16/2021    2:49 PM 07/14/2020    1:39 PM 05/06/2019    3:40 PM  Advanced Directives  Does Patient Have a Medical Advance Directive? No No No No No No No  Would patient like information on creating a medical advance directive? No - Patient declined  No - Patient declined  No - Patient declined No - Patient declined No - Patient declined    Current Medications (verified) Outpatient Encounter Medications as of 07/20/2022  Medication Sig   albuterol (PROAIR HFA) 108 (90 Base) MCG/ACT inhaler Inhale  1-2 puffs into the lungs every 6 (six) hours as needed for wheezing or shortness of breath.   ALPRAZolam (XANAX) 0.5 MG tablet Take 1 tablet (0.5 mg total) by mouth 2 (two) times daily.   aspirin 325 MG tablet daily.   calcium carbonate (OS-CAL - DOSED IN MG OF ELEMENTAL CALCIUM) 1250 MG tablet Take 1 tablet by mouth daily.   Calcium Citrate-Vitamin D (CAL-CITRATE PLUS VITAMIN D) 250-2.5 MG-MCG TABS Take 2 tablets by mouth 2 (two) times daily.   carboxymethylcellulose (REFRESH PLUS) 0.5 % SOLN Take 1 drop by mouth daily as needed. For dry eyes   chlorhexidine (PERIDEX) 0.12 % solution chlorhexidine gluconate 0.12 % mouthwash  RINSE WITH 15 MLS BY MOUTH TWICE DAILY AFTER MEALS. SWISH FOR 30 SECONDS THEN SPIT OUT   cycloSPORINE (RESTASIS) 0.05 % ophthalmic emulsion Place 1 drop into both eyes 2 (two) times daily.   cycloSPORINE (RESTASIS) 0.05 % ophthalmic emulsion Place 1 drop into both eyes every 12 (twelve) hours.   dexamethasone 0.5 MG/5ML elixir Take two teaspoons and swish, gargle, and spit qid prn canker sores.   dronabinol (MARINOL) 5 MG capsule dronabinol 5 mg capsule  TAKE ONE CAPSULE BY MOUTH TWICE A DAY   Eptinezumab-jjmr (VYEPTI IV) Inject into the vein.   fluconazole (DIFLUCAN) 150 MG tablet You can take a single dose of flucanazole if you develop vaginal irritation while taking your antibiotics.   fluticasone (FLONASE) 50 MCG/ACT nasal spray  Place 2 sprays into both nostrils daily as needed. For nasal congestion   fluticasone furoate-vilanterol (BREO ELLIPTA) 100-25 MCG/ACT AEPB Inhale 1 puff into the lungs daily.   frovatriptan (FROVA) 2.5 MG tablet Take 2.5 mg by mouth as needed. If recurs, may repeat after 2 hours. Max of 3 tabs in 24 hours. For migraines.   lidocaine (LIDODERM) 5 % Place 1 patch onto the skin daily. Remove & Discard patch within 12 hours or as directed by MD   meclizine (ANTIVERT) 25 MG tablet TAKE 1 TABLET BY MOUTH 4 TIMES A DAY AS NEEDED FOR DIZZINESS    meloxicam (MOBIC) 15 MG tablet Take 1 tablet (15 mg total) by mouth daily.   metaxalone (SKELAXIN) 800 MG tablet Take 800 mg by mouth 3 (three) times daily.   montelukast (SINGULAIR) 10 MG tablet Take 1 tablet (10 mg total) by mouth at bedtime.   Multiple Vitamin (MULTIVITAMIN) tablet Take 1 tablet by mouth every morning.   Omega-3 Fatty Acids (FISH OIL) 1200 MG CAPS Take 1 capsule by mouth every evening.   potassium chloride (KLOR-CON) 10 MEQ tablet Take two tablets once daily   promethazine (PHENERGAN) 25 MG tablet Take 1 tablet (25 mg total) by mouth every 8 (eight) hours as needed for nausea or vomiting.   RABEprazole (ACIPHEX) 20 MG tablet Take 1 tablet (20 mg total) by mouth daily.   Rimegepant Sulfate (NURTEC) 75 MG TBDP Take by mouth.   silodosin (RAPAFLO) 8 MG CAPS capsule Take 1 capsule (8 mg total) by mouth daily with breakfast.   tiZANidine (ZANAFLEX) 2 MG tablet Take 2 mg by mouth at bedtime.   torsemide (DEMADEX) 10 MG tablet Take 1 tablet (10 mg total) by mouth daily.   vitamin E 400 UNIT capsule Take 400 Units by mouth daily at 6 PM.   [DISCONTINUED] benzonatate (TESSALON) 100 MG capsule Take 1 capsule (100 mg total) by mouth every 8 (eight) hours.   [DISCONTINUED] colchicine 0.6 MG tablet Take by mouth.   [DISCONTINUED] dantrolene (DANTRIUM) 25 MG capsule dantrolene 25 mg capsule  Take 1 capsule 4 times a day by oral route as needed for 90 days.   [DISCONTINUED] doxycycline (VIBRAMYCIN) 100 MG capsule Take 1 capsule (100 mg total) by mouth 2 (two) times daily.   [DISCONTINUED] mupirocin ointment (BACTROBAN) 2 % Apply 1 Application topically 2 (two) times daily.   [DISCONTINUED] predniSONE (DELTASONE) 20 MG tablet Take 2 tablets (40 mg total) by mouth daily.   No facility-administered encounter medications on file as of 07/20/2022.    Allergies (verified) Cefazolin, Baclofen, Carbamazepine, Duloxetine, Fentanyl, Gabapentin, Naproxen, Oxcarbazepine, Pregabalin, Serotonin  reuptake inhibitors (ssris), Topiramate, Venlafaxine, Zonisamide, Ceclor [cefaclor], Milnacipran, and Ondansetron   History: Past Medical History:  Diagnosis Date   Adenomatous colon polyp 09/23/2008   ALLERGIC RHINITIS 07/01/2008   ANXIETY 07/01/2008   BACK PAIN, THORACIC REGION 03/04/2010   BRONCHITIS, CHRONIC 07/01/2008   CARPAL TUNNEL SYNDROME, HX OF 07/01/2008   CHRONIC OBSTRUCTIVE PULMONARY DISEASE, ACUTE EXACERBATION 08/07/2010   COPD (chronic obstructive pulmonary disease) ([place])    DEPRESSION 07/01/2008   DVT, HX OF 07/01/2008   ECZEMA 05/29/2009   FACIAL PAIN 12/25/2009   FIBROMYALGIA 07/01/2008   GERD 07/01/2008   Hiatal hernia    Hyperlipidemia    LEG PAIN, BILATERAL 07/01/2008   MIGRAINES, HX OF 07/01/2008   OCCIPITAL NEURALGIA 07/01/2008   OSTEOARTHRITIS 07/01/2008   Other specified trigeminal nerve disorders 07/01/2008   REFLEX SYMPATHETIC DYSTROPHY 07/01/2008   Trigeminal neuralgia 05/21/2009  Unspecified vitamin D deficiency 03/31/2010   Past Surgical History:  Procedure Laterality Date   CARPAL TUNNEL RELEASE  07/27/2007   RIGHT   EYE SURGERY     HEAD CRANIECTOMY  07/26/1992   MICROVASULAR DECOMPRESSION   LAVH     BSO   LEFT KNEE SURGERY  07/27/1987   OCCIPITAL NERVE STIMULATOR INSERTION     RIGHT FOOT BONE SPUR  07/26/1985   RIGHT FOOT SURGERY  07/26/1996   BAD CUT   RIGHT JAW SURGERY  07/26/2000   RIGHT KNEE SURGERY  07/26/1988   RIGHT SOULDER SURGERY  1992, 1993, 1995, 1999   RIGHT THUMB SUGERY  07/26/2002   tear duct surgery     TONSILLECTOMY  07/27/1955   Family History  Problem Relation Age of Onset   Ovarian cancer Mother 38   Hypertension Father    Prostate cancer Father    Heart attack Father    Heart attack Maternal Grandmother    Stroke Paternal Grandmother    Social History   Socioeconomic History   Marital status: Married    Spouse name: Doctor, general practice   Number of children: 0   Years of education: college-3   Highest education  level: Some college, no degree  Occupational History   Occupation: disabled  Tobacco Use   Smoking status: Former    Packs/day: 2.00    Years: 30.00    Total pack years: 60.00    Types: Cigarettes    Start date: 78    Quit date: 02/04/2012    Years since quitting: 10.4    Passive exposure: Current   Smokeless tobacco: Never   Tobacco comments:    nicotine in vapor and is very low nicotine   Vaping Use   Vaping Use: Every day  Substance and Sexual Activity   Alcohol use: No    Alcohol/week: 0.0 standard drinks of alcohol    Comment: quit 2000   Drug use: No   Sexual activity: Never  Other Topics Concern   Not on file  Social History Narrative   Patient lives at home with husband [name].    Patient has no children and 2 step children.    Patient is on disability since 42 for arthalgia.    Patient has 3 years of college.    Patient is right handed.    Social Determinants of Health   Financial Resource Strain: Low Risk  (07/20/2022)   Overall Financial Resource Strain (CARDIA)    Difficulty of Paying Living Expenses: Not hard at all  Food Insecurity: No Food Insecurity (07/20/2022)   Hunger Vital Sign    Worried About Running Out of Food in the Last Year: Never true    Ran Out of Food in the Last Year: Never true  Transportation Needs: No Transportation Needs (07/20/2022)   PRAPARE - Hydrologist (Medical): No    Lack of Transportation (Non-Medical): No  Physical Activity: Inactive (07/20/2022)   Exercise Vital Sign    Days of Exercise per Week: 0 days    Minutes of Exercise per Session: 0 min  Stress: Stress Concern Present (07/20/2022)   [place]    Feeling of Stress : Very much  Social Connections: Socially Integrated (07/20/2022)   Social Connection and Isolation Panel [NHANES]    Frequency of Communication with Friends and Family: More than three times a week     Frequency of Social Gatherings with Friends and Family:  More than three times a week    Attends Religious Services: More than 4 times per year    Active Member of Clubs or Organizations: Yes    Attends Archivist Meetings: More than 4 times per year    Marital Status: Married    Tobacco Counseling Counseling given: Not Answered Tobacco comments: nicotine in vapor and is very low nicotine    Clinical Intake:  Pre-visit preparation completed: No  Pain : 0-10 Pain Score: 8  Pain Type: Neuropathic pain Pain Location: Other (Comment) (Body pain. Followed by Medical attention) Pain Orientation: Other (Comment) (Body pain followed by Medical attention) Pain Descriptors / Indicators: Constant Pain Onset: More than a month ago Pain Frequency: Constant Pain Relieving Factors: Rx meds Effect of Pain on Daily Activities: Effects daily activities  Pain Relieving Factors: Rx meds  BMI - recorded: 43.57 Nutritional Status: BMI > 30  Obese Nutritional Risks: None Diabetes: No  How often do you need to have someone help you when you read instructions, pamphlets, or other written materials from your doctor or pharmacy?: 1 - Never  Diabetic?  No  Interpreter Needed?: No  Information entered by :: Rolene Arbour LPN   Activities of Daily Living    07/20/2022    3:22 PM 07/15/2022    8:21 PM  In your present state of health, do you have any difficulty performing the following activities:  Hearing? 1 1  Comment Hearing loss left ear. Does not wear hearing aids   Vision? 0 0  Difficulty concentrating or making decisions? 0 0  Walking or climbing stairs? 1 1  Comment Uses a walker   Dressing or bathing? 0 0  Doing errands, shopping? 0 0  Preparing Food and eating ? N N  Using the Toilet? N N  In the past six months, have you accidently leaked urine? Y Y  Comment Followed by medical attention   Do you have problems with loss of bowel control? N N  Managing your  Medications? N N  Managing your Finances? N N  Housekeeping or managing your Housekeeping? N Y    Patient Care Team: Eulas Post, MD as PCP - General Jettie Booze, MD as PCP - Cardiology (Cardiology) Renaldo Reel, MD as Consulting Physician (Neurology)  Indicate any recent Medical Services you may have received from other than [facility] providers in the past year (date may be approximate).     Assessment:   This is a routine wellness examination for Ashley Castaneda.  Hearing/Vision screen Hearing Screening - Comments:: Hearing loss left ear, Does not wear hearing aids Vision Screening - Comments:: Wears rx glasses - up to date with routine eye exams with  [facility].  Dietary issues and exercise activities discussed: Current Exercise Habits: The patient does not participate in regular exercise at present, Exercise limited by: neurologic condition(s);orthopedic condition(s)   Goals Addressed               This Visit's Progress     Stay Alive (pt-stated)         Depression Screen    07/20/2022    3:13 PM 07/16/2021    2:36 PM 07/14/2020    1:48 PM 10/15/2016    4:12 PM 06/14/2013   10:53 AM  PHQ 2/9 Scores  PHQ - 2 Score 0 0 0 0 0  PHQ- 9 Score   0      Fall Risk    07/20/2022    3:24 PM 07/15/2022  8:21 PM 08/27/2021    6:13 PM 07/16/2021    2:41 PM 07/14/2020    1:43 PM  Fall Risk   Falls in the past year? 0 0 0 0 1  Number falls in past yr: 0 0  0 0  Injury with Fall? 0 0  0 1  Risk for fall due to : No Fall Risks    History of fall(s);Impaired balance/gait  Follow up Falls prevention discussed    Falls evaluation completed;Falls prevention discussed    FALL RISK PREVENTION PERTAINING TO THE HOME:  Any stairs in or around the home? Yes  If so, are there any without handrails? No  Home free of loose throw rugs in walkways, pet beds, electrical cords, etc? Yes  Adequate lighting in your home to reduce risk of falls? Yes   ASSISTIVE DEVICES  UTILIZED TO PREVENT FALLS:  Life alert? No  Use of a cane, walker or w/c? Yes  Grab bars in the bathroom? Yes  Shower chair or bench in shower? Yes  Elevated toilet seat or a handicapped toilet? Yes   TIMED UP AND GO:  Was the test performed? No . Audio Visit  Cognitive Function:        07/20/2022    3:25 PM 07/16/2021    2:47 PM  6CIT Screen  What Year? 0 points 0 points  What month? 0 points 0 points  What time? 0 points 0 points  Count back from 20 0 points 0 points  Months in reverse 0 points 0 points  Repeat phrase 0 points 0 points  Total Score 0 points 0 points    Immunizations Immunization History  Administered Date(s) Administered   Fluad Quad(high Dose 65+) 04/09/2019   Influenza Split 04/30/2011, 05/04/2012   Influenza Whole 06/10/2008, 03/31/2010   Influenza, High Dose Seasonal PF 06/19/2018   Influenza, Seasonal, Injecte, Preservative Fre 09/13/2015   Influenza,inj,Quad PF,6+ Mos 06/14/2013, 03/28/2014   Influenza,inj,quad, With Preservative 06/19/2018   Influenza-Unspecified 07/26/2016, 05/04/2017, 05/26/2020, 05/25/2021   Moderna Sars-Covid-2 Vaccination 09/27/2019, 11/02/2019, 06/27/2020   Pneumococcal Conjugate-13 06/19/2018   Pneumococcal Polysaccharide-23 06/10/2008, 07/21/2020   Td 03/31/2010   Zoster Recombinat (Shingrix) 12/18/2019    TDAP status: Due, Education has been provided regarding the importance of this vaccine. Advised may receive this vaccine at local pharmacy or Health Dept. Aware to provide a copy of the vaccination record if obtained from local pharmacy or Health Dept. Verbalized acceptance and understanding.  Flu Vaccine status: Up to date  Pneumococcal vaccine status: Completed during today's visit.  Covid-19 vaccine status: Completed vaccines  Qualifies for Shingles Vaccine? Yes   Zostavax completed Yes   Shingrix Completed?: Yes  Screening Tests Health Maintenance  Topic Date Due   DTaP/Tdap/Td (2 - Tdap) 03/31/2020    COVID-19 Vaccine (6 - 2023-24 season) 08/05/2022 (Originally 03/26/2022)   Zoster Vaccines- Shingrix (2 of 2) 10/19/2022 (Originally 02/12/2020)   Lung Cancer Screening  07/21/2023 (Originally 12/27/2001)   Hepatitis C Screening  07/21/2023 (Originally 12/27/1969)   MAMMOGRAM  06/12/2023   Medicare Annual Wellness (AWV)  07/21/2023   COLONOSCOPY (Pts 45-70yr Insurance coverage will need to be confirmed)  02/05/2025   Pneumonia Vaccine 70 Years old  Completed   INFLUENZA VACCINE  Completed   DEXA SCAN  Completed   HPV VACCINES  Aged Out    Health Maintenance  Health Maintenance Due  Topic Date Due   DTaP/Tdap/Td (2 - Tdap) 03/31/2020    Colorectal cancer screening: Type of screening: Colonoscopy.  Completed 02/06/15. Repeat every 10 years  Mammogram status: Completed 06/11/22. Repeat every year  Bone Density status: Completed 01/22/20. Results reflect: Bone density results: OSTEOPOROSIS. Repeat every   years.  Lung Cancer Screening: (Low Dose CT Chest recommended if Age 70-80 years, 30 pack-year currently smoking OR have quit w/in 15years.) does qualify.   Lung Cancer Screening Referral: Deferred  Additional Screening:  Hepatitis C Screening: does qualify; Completed Deferred  Vision Screening: Recommended annual ophthalmology exams for early detection of glaucoma and other disorders of the eye. Is the patient up to date with their annual eye exam?  Yes  Who is the provider or what is the name of the office in which the patient attends annual eye exams? [facility] If pt is not established with a provider, would they like to be referred to a provider to establish care? No .   Dental Screening: Recommended annual dental exams for proper oral hygiene  Community Resource Referral / Chronic Care Management:  CRR required this visit?  No   CCM required this visit?  No      Plan:     I have personally reviewed and noted the following in the patient's chart:   Medical and  social history Use of alcohol, tobacco or illicit drugs  Current medications and supplements including opioid prescriptions. Patient is not currently taking opioid prescriptions. Functional ability and status Nutritional status Physical activity Advanced directives List of other physicians Hospitalizations, surgeries, and ER visits in previous 12 months Vitals Screenings to include cognitive, depression, and falls Referrals and appointments  In addition, I have reviewed and discussed with patient certain preventive protocols, quality metrics, and best practice recommendations. A written personalized care plan for preventive services as well as general preventive health recommendations were provided to patient.     Criselda Peaches, LPN   [id number]   Nurse Notes: Patient dur Hep-C Screening

## 2022-07-22 DIAGNOSIS — H832X2 Labyrinthine dysfunction, left ear: Secondary | ICD-10-CM | POA: Diagnosis not present

## 2022-08-06 ENCOUNTER — Ambulatory Visit (INDEPENDENT_AMBULATORY_CARE_PROVIDER_SITE_OTHER): Payer: Medicare Other | Admitting: Family Medicine

## 2022-08-06 ENCOUNTER — Encounter: Payer: Self-pay | Admitting: Family Medicine

## 2022-08-06 VITALS — BP 120/76 | HR 85 | Temp 97.9°F | Ht 62.75 in | Wt 244.2 lb

## 2022-08-06 DIAGNOSIS — R0600 Dyspnea, unspecified: Secondary | ICD-10-CM | POA: Diagnosis not present

## 2022-08-06 DIAGNOSIS — L509 Urticaria, unspecified: Secondary | ICD-10-CM | POA: Diagnosis not present

## 2022-08-06 MED ORDER — FLUTICASONE FUROATE-VILANTEROL 100-25 MCG/ACT IN AEPB: 1.0000 | INHALATION_SPRAY | Freq: Every day | RESPIRATORY_TRACT | 0 refills | Status: DC

## 2022-08-06 MED ORDER — POTASSIUM CHLORIDE ER 10 MEQ PO TBCR: EXTENDED_RELEASE_TABLET | ORAL | 0 refills | Status: DC

## 2022-08-06 MED ORDER — ALBUTEROL SULFATE HFA 108 (90 BASE) MCG/ACT IN AERS: 1.0000 | INHALATION_SPRAY | Freq: Four times a day (QID) | RESPIRATORY_TRACT | 1 refills | Status: DC | PRN

## 2022-08-06 MED ORDER — RABEPRAZOLE SODIUM 20 MG PO TBEC: 20.0000 mg | DELAYED_RELEASE_TABLET | Freq: Every day | ORAL | 3 refills | Status: DC

## 2022-08-06 NOTE — Progress Notes (Unsigned)
Established Patient Office Visit  Subjective   Patient ID: Ashley Castaneda, female    DOB: 13-Apr-1951  Age: 72 y.o. MRN: [id number]  Chief Complaint  Patient presents with   Breathing Problem   Shortness of Breath    Patient complains of shortness of breath,    Chest Pain    Patient complains of chest pain, x1 month    HPI  {History (PZWCHENI):77824} She has complicated past medical history with multiple problems including migraine headaches, history of DVT, COPD, GERD, trigeminal neuralgia, osteoarthritis, fibromyalgia, hyperlipidemia.  She is followed by multiple specialists including neurology.  She is seen today following some recent increased dyspnea.  Recent history is reviewed and is notable for the following:  -Vyepti infusion 04-27-2022 -Emgality infusion 06-25-2022. -12-2 developed pruritus -12-3 developed upper body hives.  She was seen in the ED same date and given prednisone 40 mg daily for 8 days. -12-13 developed recurrent itching and hives -12-14 was seen again and prescribed dexamethasone 4 mg 6 a day and tapered over 1 week -12-21 seen in the ED again with some shortness of breath and insomnia.  Workup then including chest x-ray unremarkable.  D-dimer and BNP levels were normal.  No recurrent urticaria since then.  She had some chronic bilateral lower extremity edema in the past.  Takes torsemide at baseline and recently has taken a couple tablets on a couple of occasions.  Recent labs from ER reviewed and stable.  Her electrolytes are stable.  Kidney function fairly stable.  CBC revealed no anemia.  She is requesting letter for jury duty.  She feels like she would have great difficulty because of her dyspnea and also severe chronic headaches which would make it difficult to concentrate.  She is requesting refills of several regular medications including her potassium supplement, albuterol, and Aciphex.  She gets these through [facility]  Past Medical History:   Diagnosis Date   Adenomatous colon polyp 09/23/2008   ALLERGIC RHINITIS 07/01/2008   ANXIETY 07/01/2008   BACK PAIN, THORACIC REGION 03/04/2010   BRONCHITIS, CHRONIC 07/01/2008   CARPAL TUNNEL SYNDROME, HX OF 07/01/2008   CHRONIC OBSTRUCTIVE PULMONARY DISEASE, ACUTE EXACERBATION 08/07/2010   COPD (chronic obstructive pulmonary disease) ([place])    DEPRESSION 07/01/2008   DVT, HX OF 07/01/2008   ECZEMA 05/29/2009   FACIAL PAIN 12/25/2009   FIBROMYALGIA 07/01/2008   GERD 07/01/2008   Hiatal hernia    Hyperlipidemia    LEG PAIN, BILATERAL 07/01/2008   MIGRAINES, HX OF 07/01/2008   OCCIPITAL NEURALGIA 07/01/2008   OSTEOARTHRITIS 07/01/2008   Other specified trigeminal nerve disorders 07/01/2008   REFLEX SYMPATHETIC DYSTROPHY 07/01/2008   Trigeminal neuralgia 05/21/2009   Unspecified vitamin D deficiency 03/31/2010   Past Surgical History:  Procedure Laterality Date   CARPAL TUNNEL RELEASE  07/27/2007   RIGHT   EYE SURGERY     HEAD CRANIECTOMY  07/26/1992   MICROVASULAR DECOMPRESSION   LAVH     BSO   LEFT KNEE SURGERY  07/27/1987   OCCIPITAL NERVE STIMULATOR INSERTION     RIGHT FOOT BONE SPUR  07/26/1985   RIGHT FOOT SURGERY  07/26/1996   BAD CUT   RIGHT JAW SURGERY  07/26/2000   RIGHT KNEE SURGERY  07/26/1988   RIGHT SOULDER SURGERY  1992, 1993, 1995, 1999   RIGHT THUMB SUGERY  07/26/2002   tear duct surgery     TONSILLECTOMY  07/27/1955    reports that she quit smoking about 10 years ago. Her smoking  use included cigarettes. She started smoking about 37 years ago. She has a 60.00 pack-year smoking history. She has been exposed to tobacco smoke. She has never used smokeless tobacco. She reports that she does not drink alcohol and does not use drugs. family history includes Heart attack in her father and maternal grandmother; Hypertension in her father; Ovarian cancer (age of onset: 17) in her mother; Prostate cancer in her father; Stroke in her paternal  grandmother. Allergies  Allergen Reactions   Cefazolin Anaphylaxis, Swelling and Other (See Comments)    Ancef - in surgery, swelled up, turned blue, heart problems Turns Blue   Baclofen Other (See Comments)    REACTION: confusion REACTION: confusion REACTION: confusion REACTION: confusion   Carbamazepine Other (See Comments)    REACTION: confusion Other reaction(s): Confusion Confusion,Bad reactions to all seizure medicine   Duloxetine Other (See Comments)   Fentanyl Nausea And Vomiting and Other (See Comments)    REACTION: nausea and vomiting   Gabapentin Other (See Comments)    REACTION: confusion   Naproxen Other (See Comments)    Elevates blood pressure   Oxcarbazepine Other (See Comments)    REACTION: confusion Other reaction(s): Other REACTION: confusion REACTION: confusion REACTION: confusion    Pregabalin Other (See Comments)    Other reaction(s): Other   Serotonin Reuptake Inhibitors (Ssris) Other (See Comments)   Topiramate Other (See Comments)    Other reaction(s): Unknown   Venlafaxine Other (See Comments)    Other reaction(s): Unknown   Zonisamide Other (See Comments)    Other reaction(s): Other   Ceclor [Cefaclor]     hives   Milnacipran Other (See Comments)   Ondansetron Nausea And Vomiting    Nausea and vomiting after IV administration    Review of Systems  Constitutional:  Negative for chills, fever and malaise/fatigue.  Eyes:  Negative for blurred vision.  Respiratory:  Positive for shortness of breath. Negative for hemoptysis and sputum production.   Cardiovascular:  Negative for palpitations and claudication.  Neurological:  Negative for dizziness, weakness and headaches.      Objective:     BP 120/76 (BP Location: Left Arm, Patient Position: Sitting, Cuff Size: Large)   Pulse 85   Temp 97.9 F (36.6 C) (Oral)   Ht 5' 2.75" (1.594 m)   Wt 244 lb 3.2 oz (110.8 kg)   LMP 07/26/1992   SpO2 95%   BMI 43.60 kg/m  {Vitals History  (Optional):23777}  Physical Exam Vitals reviewed.  Constitutional:      Appearance: She is well-developed.  Eyes:     Pupils: Pupils are equal, round, and reactive to light.  Neck:     Thyroid: No thyromegaly.     Vascular: No JVD.  Cardiovascular:     Rate and Rhythm: Normal rate and regular rhythm.     Heart sounds:     No gallop.  Pulmonary:     Effort: Pulmonary effort is normal. No respiratory distress.     Breath sounds: Normal breath sounds. No wheezing or rales.  Musculoskeletal:     Cervical back: Neck supple.     Comments: She has trace nonpitting edema lower extremities bilaterally  Neurological:     Mental Status: She is alert.      No results found for any visits on 08/06/22.  {Labs (Optional):23779}  The 10-year ASCVD risk score (Arnett DK, et al., 2019) is: 18.3%    Assessment & Plan:   #1 recent recurrent urticaria possibly related to injection from [facility].  Etiology unclear.  No rash currently.  She has discussed this with her neurology regarding further injections  #2 dyspnea.  Patient relates some chronic baseline dyspnea but worse following Emgality injection.  Dyspnea is 1 potential side effect.  She has had some very atypical chest pains intermittently.  Recent troponin and EKG unremarkable.  Recent chest x-ray unremarkable.  Recent D-dimer normal with normal BNP level. -Recommend echocardiogram to further assess  #2 longstanding history of GERD currently stable on Aciphex 20 mg daily.  Refills given   No follow-ups on file.    Carolann Littler, MD

## 2022-08-09 ENCOUNTER — Encounter: Payer: Self-pay | Admitting: Family Medicine

## 2022-08-11 ENCOUNTER — Ambulatory Visit (HOSPITAL_COMMUNITY): Payer: Medicare Other | Attending: Cardiology

## 2022-08-11 DIAGNOSIS — R0600 Dyspnea, unspecified: Secondary | ICD-10-CM | POA: Insufficient documentation

## 2022-08-11 LAB — ECHOCARDIOGRAM COMPLETE
Area-P 1/2: 3.27 cm2
S' Lateral: 3 cm

## 2022-08-11 MED ORDER — PERFLUTREN LIPID MICROSPHERE
1.0000 mL | INTRAVENOUS | Status: AC | PRN
  Administered 2022-08-11: 2 mL via INTRAVENOUS

## 2022-08-26 DIAGNOSIS — G43719 Chronic migraine without aura, intractable, without status migrainosus: Secondary | ICD-10-CM | POA: Diagnosis not present

## 2022-08-27 ENCOUNTER — Ambulatory Visit (INDEPENDENT_AMBULATORY_CARE_PROVIDER_SITE_OTHER): Payer: Medicare Other | Admitting: Family

## 2022-08-27 ENCOUNTER — Encounter: Payer: Self-pay | Admitting: Family

## 2022-08-27 VITALS — BP 134/72 | HR 81 | Temp 98.0°F | Resp 20 | Ht 62.0 in | Wt 247.0 lb

## 2022-08-27 DIAGNOSIS — J441 Chronic obstructive pulmonary disease with (acute) exacerbation: Secondary | ICD-10-CM | POA: Diagnosis not present

## 2022-08-27 DIAGNOSIS — J069 Acute upper respiratory infection, unspecified: Secondary | ICD-10-CM | POA: Diagnosis not present

## 2022-08-27 DIAGNOSIS — R059 Cough, unspecified: Secondary | ICD-10-CM | POA: Diagnosis not present

## 2022-08-27 MED ORDER — FLUTICASONE FUROATE-VILANTEROL 200-25 MCG/ACT IN AEPB: 1.0000 | INHALATION_SPRAY | Freq: Every day | RESPIRATORY_TRACT | 11 refills | Status: DC

## 2022-08-27 MED ORDER — AZITHROMYCIN 250 MG PO TABS: 250.0000 mg | ORAL_TABLET | Freq: Every day | ORAL | 0 refills | Status: DC

## 2022-08-29 NOTE — Progress Notes (Signed)
Acute Office Visit  Subjective:     Patient ID: Ashley Castaneda, female    DOB: January 22, 1951, 72 y.o.   MRN: [id number]  Chief Complaint  Patient presents with   Cough    Sneezing , nasal congestion, SOB     HPI Patient is in today with c/o cough, nasal congestion and sneezing x 1 week and worsening. Reports beginning to feel a burning sensation in her chest when she gets bronchitis. Dr. Elease Hashimoto placed a referral for her to see pulmonology but she has not seen them yet. She is concerned and wants to be treated today because her husband has a weakened immune system.   Review of Systems  Constitutional:  Negative for chills and fever.  HENT: Negative.    Respiratory:  Positive for cough, sputum production and wheezing.        Clear to yellow phlegm  Cardiovascular: Negative.   Gastrointestinal:  Positive for heartburn.  Musculoskeletal: Negative.   Neurological: Negative.   Psychiatric/Behavioral: Negative.     Past Medical History:  Diagnosis Date   Adenomatous colon polyp 09/23/2008   ALLERGIC RHINITIS 07/01/2008   ANXIETY 07/01/2008   BACK PAIN, THORACIC REGION 03/04/2010   BRONCHITIS, CHRONIC 07/01/2008   CARPAL TUNNEL SYNDROME, HX OF 07/01/2008   CHRONIC OBSTRUCTIVE PULMONARY DISEASE, ACUTE EXACERBATION 08/07/2010   COPD (chronic obstructive pulmonary disease) ([place])    DEPRESSION 07/01/2008   DVT, HX OF 07/01/2008   ECZEMA 05/29/2009   FACIAL PAIN 12/25/2009   FIBROMYALGIA 07/01/2008   GERD 07/01/2008   Hiatal hernia    Hyperlipidemia    LEG PAIN, BILATERAL 07/01/2008   MIGRAINES, HX OF 07/01/2008   OCCIPITAL NEURALGIA 07/01/2008   OSTEOARTHRITIS 07/01/2008   Other specified trigeminal nerve disorders 07/01/2008   REFLEX SYMPATHETIC DYSTROPHY 07/01/2008   Trigeminal neuralgia 05/21/2009   Unspecified vitamin D deficiency 03/31/2010    Social History   Socioeconomic History   Marital status: Married    Spouse name: Doctor, general practice   Number of children: 0   Years of  education: college-3   Highest education level: Some college, no degree  Occupational History   Occupation: disabled  Tobacco Use   Smoking status: Former    Packs/day: 2.00    Years: 30.00    Total pack years: 60.00    Types: Cigarettes    Start date: 87    Quit date: 02/04/2012    Years since quitting: 10.5    Passive exposure: Current   Smokeless tobacco: Never   Tobacco comments:    nicotine in vapor and is very low nicotine   Vaping Use   Vaping Use: Every day  Substance and Sexual Activity   Alcohol use: No    Alcohol/week: 0.0 standard drinks of alcohol    Comment: quit 2000   Drug use: No   Sexual activity: Never  Other Topics Concern   Not on file  Social History Narrative   Patient lives at home with husband [name].    Patient has no children and 2 step children.    Patient is on disability since 8 for arthalgia.    Patient has 3 years of college.    Patient is right handed.    Social Determinants of Health   Financial Resource Strain: Low Risk  (07/20/2022)   Overall Financial Resource Strain (CARDIA)    Difficulty of Paying Living Expenses: Not hard at all  Food Insecurity: No Food Insecurity (07/20/2022)   Hunger Vital Sign    Worried  About Running Out of Food in the Last Year: Never true    Ran Out of Food in the Last Year: Never true  Transportation Needs: No Transportation Needs (07/20/2022)   PRAPARE - Hydrologist (Medical): No    Lack of Transportation (Non-Medical): No  Physical Activity: Inactive (07/20/2022)   Exercise Vital Sign    Days of Exercise per Week: 0 days    Minutes of Exercise per Session: 0 min  Stress: Stress Concern Present (07/20/2022)   [place]    Feeling of Stress : Very much  Social Connections: Socially Integrated (07/20/2022)   Social Connection and Isolation Panel [NHANES]    Frequency of Communication with Friends and  Family: More than three times a week    Frequency of Social Gatherings with Friends and Family: More than three times a week    Attends Religious Services: More than 4 times per year    Active Member of Clubs or Organizations: Yes    Attends Music therapist: More than 4 times per year    Marital Status: Married  Human resources officer Violence: Not At Risk (07/20/2022)   Humiliation, Afraid, Rape, and Kick questionnaire    Fear of Current or Ex-Partner: No    Emotionally Abused: No    Physically Abused: No    Sexually Abused: No    Past Surgical History:  Procedure Laterality Date   CARPAL TUNNEL RELEASE  07/27/2007   RIGHT   EYE SURGERY     HEAD CRANIECTOMY  07/26/1992   MICROVASULAR DECOMPRESSION   LAVH     BSO   LEFT KNEE SURGERY  07/27/1987   OCCIPITAL NERVE STIMULATOR INSERTION     RIGHT FOOT BONE SPUR  07/26/1985   RIGHT FOOT SURGERY  07/26/1996   BAD CUT   RIGHT JAW SURGERY  07/26/2000   RIGHT KNEE SURGERY  07/26/1988   RIGHT [place]  07/26/2002   tear duct surgery     TONSILLECTOMY  07/27/1955    Family History  Problem Relation Age of Onset   Ovarian cancer Mother 62   Hypertension Father    Prostate cancer Father    Heart attack Father    Heart attack Maternal Grandmother    Stroke Paternal Grandmother     Allergies  Allergen Reactions   Cefazolin Anaphylaxis, Swelling and Other (See Comments)    Ancef - in surgery, swelled up, turned blue, heart problems Turns Blue   Baclofen Other (See Comments)    REACTION: confusion REACTION: confusion REACTION: confusion REACTION: confusion   Carbamazepine Other (See Comments)    REACTION: confusion Other reaction(s): Confusion Confusion,Bad reactions to all seizure medicine   Duloxetine Other (See Comments)   Fentanyl Nausea And Vomiting and Other (See Comments)    REACTION: nausea and vomiting   Gabapentin Other (See Comments)    REACTION:  confusion   Naproxen Other (See Comments)    Elevates blood pressure   Oxcarbazepine Other (See Comments)    REACTION: confusion Other reaction(s): Other REACTION: confusion REACTION: confusion REACTION: confusion    Pregabalin Other (See Comments)    Other reaction(s): Other   Serotonin Reuptake Inhibitors (Ssris) Other (See Comments)   Topiramate Other (See Comments)    Other reaction(s): Unknown   Venlafaxine Other (See Comments)    Other reaction(s): Unknown   Zonisamide Other (See Comments)    Other  reaction(s): Other   Ceclor [Cefaclor]     hives   Milnacipran Other (See Comments)   Ondansetron Nausea And Vomiting    Nausea and vomiting after IV administration    Current Outpatient Medications on File Prior to Visit  Medication Sig Dispense Refill   albuterol (PROAIR HFA) 108 (90 Base) MCG/ACT inhaler Inhale 1-2 puffs into the lungs every 6 (six) hours as needed for wheezing or shortness of breath. 8 g 1   ALPRAZolam (XANAX) 0.5 MG tablet Take 1 tablet (0.5 mg total) by mouth 2 (two) times daily. 180 tablet 1   aspirin 325 MG tablet daily.     calcium carbonate (OS-CAL - DOSED IN MG OF ELEMENTAL CALCIUM) 1250 MG tablet Take 1 tablet by mouth daily.     Calcium Citrate-Vitamin D (CAL-CITRATE PLUS VITAMIN D) 250-2.5 MG-MCG TABS Take 2 tablets by mouth 2 (two) times daily. 120 tablet 3   carboxymethylcellulose (REFRESH PLUS) 0.5 % SOLN Take 1 drop by mouth daily as needed. For dry eyes     chlorhexidine (PERIDEX) 0.12 % solution chlorhexidine gluconate 0.12 % mouthwash  RINSE WITH 15 MLS BY MOUTH TWICE DAILY AFTER MEALS. SWISH FOR 30 SECONDS THEN SPIT OUT     cycloSPORINE (RESTASIS) 0.05 % ophthalmic emulsion Place 1 drop into both eyes 2 (two) times daily.     cycloSPORINE (RESTASIS) 0.05 % ophthalmic emulsion Place 1 drop into both eyes every 12 (twelve) hours.     dronabinol (MARINOL) 5 MG capsule dronabinol 5 mg capsule  TAKE ONE CAPSULE BY MOUTH TWICE A DAY      Eptinezumab-jjmr (VYEPTI IV) Inject into the vein.     fluticasone (FLONASE) 50 MCG/ACT nasal spray Place 2 sprays into both nostrils daily as needed. For nasal congestion 16 g 3   frovatriptan (FROVA) 2.5 MG tablet Take 2.5 mg by mouth as needed. If recurs, may repeat after 2 hours. Max of 3 tabs in 24 hours. For migraines.     lidocaine (LIDODERM) 5 % Place 1 patch onto the skin daily. Remove & Discard patch within 12 hours or as directed by MD 30 patch 1   meclizine (ANTIVERT) 25 MG tablet TAKE 1 TABLET BY MOUTH 4 TIMES A DAY AS NEEDED FOR DIZZINESS 120 tablet 1   meloxicam (MOBIC) 15 MG tablet Take 1 tablet (15 mg total) by mouth daily. 90 tablet 3   metaxalone (SKELAXIN) 800 MG tablet Take 800 mg by mouth 3 (three) times daily.     montelukast (SINGULAIR) 10 MG tablet Take 1 tablet (10 mg total) by mouth at bedtime. 90 tablet 3   Multiple Vitamin (MULTIVITAMIN) tablet Take 1 tablet by mouth every morning.     Omega-3 Fatty Acids (FISH OIL) 1200 MG CAPS Take 1 capsule by mouth every evening.     potassium chloride (KLOR-CON) 10 MEQ tablet Take two tablets once daily 180 tablet 0   promethazine (PHENERGAN) 25 MG tablet Take 1 tablet (25 mg total) by mouth every 8 (eight) hours as needed for nausea or vomiting. 20 tablet 0   RABEprazole (ACIPHEX) 20 MG tablet Take 1 tablet (20 mg total) by mouth daily. 90 tablet 3   Rimegepant Sulfate (NURTEC) 75 MG TBDP Take by mouth.     tiZANidine (ZANAFLEX) 2 MG tablet Take 2 mg by mouth at bedtime.     torsemide (DEMADEX) 10 MG tablet Take 1 tablet (10 mg total) by mouth daily. 90 tablet 3   vitamin E 400 UNIT capsule Take  400 Units by mouth daily at 6 PM.     No current facility-administered medications on file prior to visit.    BP 134/72   Pulse 81   Temp 98 F (36.7 C)   Resp 20   Ht 5' 2"$  (1.575 m)   Wt 247 lb (112 kg)   LMP 07/26/1992   SpO2 98%   BMI 45.18 kg/m chart      Objective:    BP 134/72   Pulse 81   Temp 98 F (36.7 C)    Resp 20   Ht 5' 2"$  (1.575 m)   Wt 247 lb (112 kg)   LMP 07/26/1992   SpO2 98%   BMI 45.18 kg/m    Physical Exam Vitals and nursing note reviewed.  Constitutional:      Appearance: Normal appearance. She is obese.  HENT:     Right Ear: Tympanic membrane, ear canal and external ear normal.     Left Ear: Tympanic membrane, ear canal and external ear normal.     Nose: Congestion and rhinorrhea present.  Cardiovascular:     Rate and Rhythm: Normal rate and regular rhythm.     Pulses: Normal pulses.     Heart sounds: Normal heart sounds.  Pulmonary:     Effort: Pulmonary effort is normal.     Breath sounds: No wheezing.     Comments: Mild expiratory wheezing.  Musculoskeletal:        General: Normal range of motion.     Cervical back: Normal range of motion and neck supple.  Skin:    General: Skin is warm and dry.  Neurological:     General: No focal deficit present.     Mental Status: She is alert and oriented to person, place, and time.  Psychiatric:        Mood and Affect: Mood normal.        Behavior: Behavior normal.     No results found for any visits on 08/27/22.      Assessment & Plan:   Problem List Items Addressed This Visit     CHRONIC OBSTRUCTIVE PULMONARY DISEASE, ACUTE EXACERBATION   Relevant Medications   fluticasone furoate-vilanterol (BREO ELLIPTA) 200-25 MCG/ACT AEPB   azithromycin (ZITHROMAX Z-PAK) 250 MG tablet   Other Visit Diagnoses     Cough, unspecified type    -  Primary   Relevant Orders   POCT Influenza A/B   POC COVID-19   Viral URI with cough       Relevant Medications   azithromycin (ZITHROMAX Z-PAK) 250 MG tablet       Meds ordered this encounter  Medications   fluticasone furoate-vilanterol (BREO ELLIPTA) 200-25 MCG/ACT AEPB    Sig: Inhale 1 puff into the lungs daily.    Dispense:  1 each    Refill:  11   azithromycin (ZITHROMAX Z-PAK) 250 MG tablet    Sig: Take 1 tablet (250 mg total) by mouth daily. As directed     Dispense:  6 tablet    Refill:  0    2 tabs today then 1 tab daily x 4 more days   Call the office if symptoms worsen or persist. Recheck as scheduled and sooner as needed.  No follow-ups on file.  Kennyth Arnold, FNP

## 2022-08-30 LAB — POCT INFLUENZA A/B
Influenza A, POC: NEGATIVE
Influenza B, POC: NEGATIVE

## 2022-08-30 LAB — POC COVID19 BINAXNOW: SARS Coronavirus 2 Ag: NEGATIVE

## 2022-09-06 ENCOUNTER — Telehealth: Payer: Self-pay | Admitting: Family Medicine

## 2022-09-06 ENCOUNTER — Ambulatory Visit (INDEPENDENT_AMBULATORY_CARE_PROVIDER_SITE_OTHER): Payer: Medicare Other | Admitting: Family Medicine

## 2022-09-06 VITALS — BP 120/76 | HR 85 | Temp 97.6°F | Ht 62.0 in | Wt 245.4 lb

## 2022-09-06 DIAGNOSIS — R059 Cough, unspecified: Secondary | ICD-10-CM

## 2022-09-06 MED ORDER — DOXYCYCLINE HYCLATE 100 MG PO CAPS: 100.0000 mg | ORAL_CAPSULE | Freq: Two times a day (BID) | ORAL | 0 refills | Status: DC

## 2022-09-06 MED ORDER — POTASSIUM CHLORIDE ER 10 MEQ PO TBCR: EXTENDED_RELEASE_TABLET | ORAL | 0 refills | Status: DC

## 2022-09-06 NOTE — Telephone Encounter (Signed)
Patient has been scheduled today with PCP

## 2022-09-06 NOTE — Progress Notes (Signed)
Established Patient Office Visit  Subjective   Patient ID: Ashley Castaneda, female    DOB: July 04, 1951  Age: 72 y.o. MRN: [id number]  Chief Complaint  Patient presents with   Cough    Patient complains of cough, x2 weeks, Tried Azithromycin, Flonase, Mucinex and Albuterol with little relief    HPI   Ashley Castaneda is seen with some ongoing cough and sinus congestive symptoms.  Her symptoms started around February 1.  She was seen here on the fifth and treated with Zithromax and Mucinex.  Flu screen and COVID screen were negative at that time.  Her main concern is that her husband has ALS and she has been trying to isolate from him.  Her cough is occasionally productive but sometimes dry.  She feels like she may have some intermittent wheezing.  She uses Breo inhaler at baseline and ProAir as needed Quit smoking 2013.  History of COPD.  Denies any pleuritic pain.  No dyspnea at rest.  Past Medical History:  Diagnosis Date   Adenomatous colon polyp 09/23/2008   ALLERGIC RHINITIS 07/01/2008   ANXIETY 07/01/2008   BACK PAIN, THORACIC REGION 03/04/2010   BRONCHITIS, CHRONIC 07/01/2008   CARPAL TUNNEL SYNDROME, HX OF 07/01/2008   CHRONIC OBSTRUCTIVE PULMONARY DISEASE, ACUTE EXACERBATION 08/07/2010   COPD (chronic obstructive pulmonary disease) ([place])    DEPRESSION 07/01/2008   DVT, HX OF 07/01/2008   ECZEMA 05/29/2009   FACIAL PAIN 12/25/2009   FIBROMYALGIA 07/01/2008   GERD 07/01/2008   Hiatal hernia    Hyperlipidemia    LEG PAIN, BILATERAL 07/01/2008   MIGRAINES, HX OF 07/01/2008   OCCIPITAL NEURALGIA 07/01/2008   OSTEOARTHRITIS 07/01/2008   Other specified trigeminal nerve disorders 07/01/2008   REFLEX SYMPATHETIC DYSTROPHY 07/01/2008   Trigeminal neuralgia 05/21/2009   Unspecified vitamin D deficiency 03/31/2010   Past Surgical History:  Procedure Laterality Date   CARPAL TUNNEL RELEASE  07/27/2007   RIGHT   EYE SURGERY     HEAD CRANIECTOMY  07/26/1992   MICROVASULAR DECOMPRESSION    LAVH     BSO   LEFT KNEE SURGERY  07/27/1987   OCCIPITAL NERVE STIMULATOR INSERTION     RIGHT FOOT BONE SPUR  07/26/1985   RIGHT FOOT SURGERY  07/26/1996   BAD CUT   RIGHT JAW SURGERY  07/26/2000   RIGHT KNEE SURGERY  07/26/1988   RIGHT SOULDER SURGERY  1992, 1993, 1995, 1999   RIGHT THUMB SUGERY  07/26/2002   tear duct surgery     TONSILLECTOMY  07/27/1955    reports that she quit smoking about 10 years ago. Her smoking use included cigarettes. She started smoking about 37 years ago. She has a 60.00 pack-year smoking history. She has been exposed to tobacco smoke. She has never used smokeless tobacco. She reports that she does not drink alcohol and does not use drugs. family history includes Heart attack in her father and maternal grandmother; Hypertension in her father; Ovarian cancer (age of onset: 63) in her mother; Prostate cancer in her father; Stroke in her paternal grandmother. Allergies  Allergen Reactions   Cefazolin Anaphylaxis, Swelling and Other (See Comments)    Ancef - in surgery, swelled up, turned blue, heart problems Turns Blue   Baclofen Other (See Comments)    REACTION: confusion REACTION: confusion REACTION: confusion REACTION: confusion   Carbamazepine Other (See Comments)    REACTION: confusion Other reaction(s): Confusion Confusion,Bad reactions to all seizure medicine   Duloxetine Other (See Comments)   Fentanyl Nausea And  Vomiting and Other (See Comments)    REACTION: nausea and vomiting   Gabapentin Other (See Comments)    REACTION: confusion   Naproxen Other (See Comments)    Elevates blood pressure   Oxcarbazepine Other (See Comments)    REACTION: confusion Other reaction(s): Other REACTION: confusion REACTION: confusion REACTION: confusion    Pregabalin Other (See Comments)    Other reaction(s): Other   Serotonin Reuptake Inhibitors (Ssris) Other (See Comments)   Topiramate Other (See Comments)    Other reaction(s): Unknown    Venlafaxine Other (See Comments)    Other reaction(s): Unknown   Zonisamide Other (See Comments)    Other reaction(s): Other   Ceclor [Cefaclor]     hives   Milnacipran Other (See Comments)   Ondansetron Nausea And Vomiting    Nausea and vomiting after IV administration    Review of Systems  Constitutional:  Negative for chills, fever and weight loss.  Respiratory:  Positive for cough. Negative for hemoptysis.   Cardiovascular:  Negative for leg swelling.  Neurological:  Negative for dizziness.      Objective:     BP 120/76 (BP Location: Left Arm, Patient Position: Sitting, Cuff Size: Large)   Pulse 85   Temp 97.6 F (36.4 C) (Oral)   Ht 5' 2"$  (1.575 m)   Wt 245 lb 6.4 oz (111.3 kg)   LMP 07/26/1992   SpO2 98%   BMI 44.88 kg/m    Physical Exam Vitals reviewed.  Cardiovascular:     Rate and Rhythm: Normal rate and regular rhythm.  Pulmonary:     Effort: Pulmonary effort is normal.     Breath sounds: Normal breath sounds. No wheezing or rales.  Musculoskeletal:     Right lower leg: No edema.     Left lower leg: No edema.  Neurological:     Mental Status: She is alert.      No results found for any visits on 09/06/22.    The 10-year ASCVD risk score (Arnett DK, et al., 2019) is: 18.3%    Assessment & Plan:   Cough.  Suspect lingering bronchitis.  She does have a history of COPD.  Her cough is occasionally productive.  Does not have any rales, hypoxia, fever, etc.  Continue with her Breo inhaler as well as rescue inhaler.  We did write for doxycycline 100 mg twice daily for 7 days if she has persistent productive cough not clearing over the next few days.  She has been very intolerant of oral prednisone in the past and did not see any clear indication today as she has no wheezing on exam.   Carolann Littler, MD

## 2022-09-06 NOTE — Telephone Encounter (Signed)
Triage Nurse call and stated pt is have chest pains and she request that she go to the ER but pt refuse to go she stated pt said it is not that bad.

## 2022-09-13 DIAGNOSIS — Z961 Presence of intraocular lens: Secondary | ICD-10-CM | POA: Diagnosis not present

## 2022-09-13 DIAGNOSIS — Z882 Allergy status to sulfonamides status: Secondary | ICD-10-CM | POA: Diagnosis not present

## 2022-09-13 DIAGNOSIS — Z881 Allergy status to other antibiotic agents status: Secondary | ICD-10-CM | POA: Diagnosis not present

## 2022-09-13 DIAGNOSIS — Z886 Allergy status to analgesic agent status: Secondary | ICD-10-CM | POA: Diagnosis not present

## 2022-09-13 DIAGNOSIS — H04123 Dry eye syndrome of bilateral lacrimal glands: Secondary | ICD-10-CM | POA: Diagnosis not present

## 2022-09-13 DIAGNOSIS — Z880 Allergy status to penicillin: Secondary | ICD-10-CM | POA: Diagnosis not present

## 2022-09-13 DIAGNOSIS — Z888 Allergy status to other drugs, medicaments and biological substances status: Secondary | ICD-10-CM | POA: Diagnosis not present

## 2022-09-13 DIAGNOSIS — G514 Facial myokymia: Secondary | ICD-10-CM | POA: Diagnosis not present

## 2022-11-08 DIAGNOSIS — H04123 Dry eye syndrome of bilateral lacrimal glands: Secondary | ICD-10-CM | POA: Diagnosis not present

## 2022-11-08 DIAGNOSIS — Z961 Presence of intraocular lens: Secondary | ICD-10-CM | POA: Diagnosis not present

## 2022-11-08 DIAGNOSIS — G514 Facial myokymia: Secondary | ICD-10-CM | POA: Diagnosis not present

## 2022-11-24 DIAGNOSIS — G43719 Chronic migraine without aura, intractable, without status migrainosus: Secondary | ICD-10-CM | POA: Diagnosis not present

## 2022-12-22 ENCOUNTER — Emergency Department (HOSPITAL_BASED_OUTPATIENT_CLINIC_OR_DEPARTMENT_OTHER): Payer: Medicare Other

## 2022-12-22 ENCOUNTER — Emergency Department (HOSPITAL_BASED_OUTPATIENT_CLINIC_OR_DEPARTMENT_OTHER): Payer: Medicare Other | Admitting: Radiology

## 2022-12-22 ENCOUNTER — Other Ambulatory Visit (HOSPITAL_BASED_OUTPATIENT_CLINIC_OR_DEPARTMENT_OTHER): Payer: Self-pay

## 2022-12-22 ENCOUNTER — Encounter (HOSPITAL_BASED_OUTPATIENT_CLINIC_OR_DEPARTMENT_OTHER): Payer: Self-pay | Admitting: Emergency Medicine

## 2022-12-22 ENCOUNTER — Other Ambulatory Visit: Payer: Self-pay

## 2022-12-22 ENCOUNTER — Emergency Department (HOSPITAL_BASED_OUTPATIENT_CLINIC_OR_DEPARTMENT_OTHER)
Admission: EM | Admit: 2022-12-22 | Discharge: 2022-12-22 | Disposition: A | Discharge status: Home / Self Care | Payer: Medicare Other | Attending: Emergency Medicine | Admitting: Emergency Medicine

## 2022-12-22 DIAGNOSIS — D649 Anemia, unspecified: Secondary | ICD-10-CM | POA: Diagnosis not present

## 2022-12-22 DIAGNOSIS — Z7982 Long term (current) use of aspirin: Secondary | ICD-10-CM | POA: Diagnosis not present

## 2022-12-22 DIAGNOSIS — R918 Other nonspecific abnormal finding of lung field: Secondary | ICD-10-CM | POA: Diagnosis not present

## 2022-12-22 DIAGNOSIS — M79661 Pain in right lower leg: Secondary | ICD-10-CM | POA: Diagnosis not present

## 2022-12-22 DIAGNOSIS — M79604 Pain in right leg: Secondary | ICD-10-CM

## 2022-12-22 DIAGNOSIS — J449 Chronic obstructive pulmonary disease, unspecified: Secondary | ICD-10-CM | POA: Diagnosis not present

## 2022-12-22 DIAGNOSIS — M7989 Other specified soft tissue disorders: Secondary | ICD-10-CM | POA: Diagnosis not present

## 2022-12-22 DIAGNOSIS — R600 Localized edema: Secondary | ICD-10-CM | POA: Diagnosis not present

## 2022-12-22 DIAGNOSIS — Z7951 Long term (current) use of inhaled steroids: Secondary | ICD-10-CM | POA: Insufficient documentation

## 2022-12-22 DIAGNOSIS — M79662 Pain in left lower leg: Secondary | ICD-10-CM | POA: Diagnosis not present

## 2022-12-22 DIAGNOSIS — M79605 Pain in left leg: Secondary | ICD-10-CM | POA: Diagnosis not present

## 2022-12-22 LAB — TROPONIN I (HIGH SENSITIVITY)
Troponin I (High Sensitivity): <2 ng/L (ref <18)
Troponin I (High Sensitivity): <2 ng/L (ref <18)

## 2022-12-22 LAB — CBC WITH DIFFERENTIAL/PLATELET
Abs Immature Granulocytes: 0.02 10*3/uL (ref 0.00–0.07)
Basophils Absolute: 0.1 10*3/uL (ref 0.0–0.1)
Basophils Relative: 1 %
Eosinophils Absolute: 0.2 10*3/uL (ref 0.0–0.5)
Eosinophils Relative: 3 %
HCT: 33.3 % — ABNORMAL LOW (ref 36.0–46.0)
Hemoglobin: 10.9 g/dL — ABNORMAL LOW (ref 12.0–15.0)
Immature Granulocytes: 0 %
Lymphocytes Relative: 20 %
Lymphs Abs: 1.4 10*3/uL (ref 0.7–4.0)
MCH: 30 pg (ref 26.0–34.0)
MCHC: 32.7 g/dL (ref 30.0–36.0)
MCV: 91.7 fL (ref 80.0–100.0)
Monocytes Absolute: 0.7 10*3/uL (ref 0.1–1.0)
Monocytes Relative: 10 %
Neutro Abs: 4.5 10*3/uL (ref 1.7–7.7)
Neutrophils Relative %: 66 %
Platelets: 288 10*3/uL (ref 150–400)
RBC: 3.63 MIL/uL — ABNORMAL LOW (ref 3.87–5.11)
RDW: 13.8 % (ref 11.5–15.5)
WBC: 6.8 10*3/uL (ref 4.0–10.5)
nRBC: 0 % (ref 0.0–0.2)

## 2022-12-22 LAB — BASIC METABOLIC PANEL
Anion gap: 11 (ref 5–15)
BUN: 17 mg/dL (ref 8–23)
CO2: 24 mmol/L (ref 22–32)
Calcium: 8.3 mg/dL — ABNORMAL LOW (ref 8.9–10.3)
Chloride: 102 mmol/L (ref 98–111)
Creatinine, Ser: 1.06 mg/dL — ABNORMAL HIGH (ref 0.44–1.00)
GFR, Estimated: 57 mL/min — ABNORMAL LOW (ref >60)
Glucose, Bld: 93 mg/dL (ref 70–99)
Potassium: 4.5 mmol/L (ref 3.5–5.1)
Sodium: 137 mmol/L (ref 135–145)

## 2022-12-22 LAB — CK: Total CK: 53 U/L (ref 38–234)

## 2022-12-22 LAB — BRAIN NATRIURETIC PEPTIDE: B Natriuretic Peptide: 47.8 pg/mL (ref 0.0–100.0)

## 2022-12-22 LAB — MAGNESIUM: Magnesium: 2.1 mg/dL (ref 1.7–2.4)

## 2022-12-22 NOTE — ED Notes (Signed)
DC papers reviewed. No questions or concerns. No signs of distress. Pt assisted to wheelchair and out to lobby. Appropriate measures for safety taken. 

## 2022-12-22 NOTE — Discharge Instructions (Addendum)
Your cardiac workup was reassuring as was your laboratory evaluation with no evidence of electrolyte abnormality.  Your ultrasound was negative for blood clot in both legs.  Your symptoms are likely consistent with musculoskeletal pain in the setting of your history of chronic pain.  Recommend continued outpatient pain control per your outpatient pain regimen.

## 2022-12-22 NOTE — ED Notes (Signed)
Bedside US complete

## 2022-12-22 NOTE — ED Provider Notes (Signed)
[place] [facility] AT [facility] Provider Note   CSN: [id number] Arrival date & time: 12/22/22  1446     History  Chief Complaint  Patient presents with   Leg Pain    Ashley Castaneda is a 72 y.o. female.   Leg Pain    72 year old female with medical history significant for anxiety, depression, trigeminal neuralgia, occipital neuralgia, fibromyalgia, history of DVT in 2009 not on anticoagulation, COPD, HLD, reflex sympathetic dystrophy who presents to the emergency department with leg pain and cramping with associated swelling.  The patient is worried that she has a recurrent DVT.  She has noticed pain and cramping for the last few days with associated swelling.  She endorses it bilaterally but feels that symptoms are more significant in the right lower extremity.  She is on 10 mg of oral Lasix for occasional swelling and took an extra dose last week.  She was wondering also if she could be experiencing cramping due to electrolyte abnormalities.  She is on potassium supplementation outpatient.  She additionally endorses mild shortness of breath, endorses some mild substernal chest pressure without radiation that started intermittently today.  No sharp pleuritic chest pain.  No cough fever or chills.  Home Medications Prior to Admission medications   Medication Sig Start Date End Date Taking? Authorizing Provider  albuterol (PROAIR HFA) 108 (90 Base) MCG/ACT inhaler Inhale 1-2 puffs into the lungs every 6 (six) hours as needed for wheezing or shortness of breath. 08/06/22   Burchette, Elberta Fortis, MD  ALPRAZolam Prudy Feeler) 0.5 MG tablet Take 1 tablet (0.5 mg total) by mouth 2 (two) times daily. 07/06/22   Burchette, Elberta Fortis, MD  aspirin 325 MG tablet daily.    [provider]  calcium carbonate (OS-CAL - DOSED IN MG OF ELEMENTAL CALCIUM) 1250 MG tablet Take 1 tablet by mouth daily.    [provider]  Calcium Citrate-Vitamin D (CAL-CITRATE PLUS VITAMIN D)  250-2.5 MG-MCG TABS Take 2 tablets by mouth 2 (two) times daily. 06/30/22   Burchette, Elberta Fortis, MD  carboxymethylcellulose (REFRESH PLUS) 0.5 % SOLN Take 1 drop by mouth daily as needed. For dry eyes    [provider]  chlorhexidine (PERIDEX) 0.12 % solution chlorhexidine gluconate 0.12 % mouthwash  RINSE WITH 15 MLS BY MOUTH TWICE DAILY AFTER MEALS. SWISH FOR 30 SECONDS THEN SPIT OUT    [provider]  cycloSPORINE (RESTASIS) 0.05 % ophthalmic emulsion Place 1 drop into both eyes 2 (two) times daily.    [provider]  cycloSPORINE (RESTASIS) 0.05 % ophthalmic emulsion Place 1 drop into both eyes every 12 (twelve) hours. 05/07/21   [provider]  doxycycline (VIBRAMYCIN) 100 MG capsule Take 1 capsule (100 mg total) by mouth 2 (two) times daily. 09/06/22   Burchette, Elberta Fortis, MD  dronabinol (MARINOL) 5 MG capsule dronabinol 5 mg capsule  TAKE ONE CAPSULE BY MOUTH TWICE A DAY    [provider]  Eptinezumab-jjmr (VYEPTI IV) Inject into the vein.    [provider]  fluticasone (FLONASE) 50 MCG/ACT nasal spray Place 2 sprays into both nostrils daily as needed. For nasal congestion 08/28/21 08/05/23  Burchette, Elberta Fortis, MD  fluticasone furoate-vilanterol (BREO ELLIPTA) 200-25 MCG/ACT AEPB Inhale 1 puff into the lungs daily. 08/27/22   Worthy Rancher B, FNP  frovatriptan (FROVA) 2.5 MG tablet Take 2.5 mg by mouth as needed. If recurs, may repeat after 2 hours. Max of 3 tabs in 24 hours. For migraines.  [provider]  lidocaine (LIDODERM) 5 % Place 1 patch onto the skin daily. Remove & Discard patch within 12 hours or as directed by MD 04/12/18   Kristian Covey, MD  meclizine (ANTIVERT) 25 MG tablet TAKE 1 TABLET BY MOUTH 4 TIMES A DAY AS NEEDED FOR DIZZINESS 04/17/14   York Spaniel, MD  meloxicam (MOBIC) 15 MG tablet Take 1 tablet (15 mg total) by mouth daily. 02/26/22   Burchette, Elberta Fortis, MD  metaxalone (SKELAXIN) 800 MG tablet Take  800 mg by mouth 3 (three) times daily.    [provider]  montelukast (SINGULAIR) 10 MG tablet Take 1 tablet (10 mg total) by mouth at bedtime. 02/26/22   Burchette, Elberta Fortis, MD  Multiple Vitamin (MULTIVITAMIN) tablet Take 1 tablet by mouth every morning.    [provider]  Omega-3 Fatty Acids (FISH OIL) 1200 MG CAPS Take 1 capsule by mouth every evening.    [provider]  potassium chloride (KLOR-CON) 10 MEQ tablet Take two tablets once daily 09/06/22   Burchette, Elberta Fortis, MD  promethazine (PHENERGAN) 25 MG tablet Take 1 tablet (25 mg total) by mouth every 8 (eight) hours as needed for nausea or vomiting. 02/26/22   Burchette, Elberta Fortis, MD  RABEprazole (ACIPHEX) 20 MG tablet Take 1 tablet (20 mg total) by mouth daily. 08/06/22   Burchette, Elberta Fortis, MD  Rimegepant Sulfate (NURTEC) 75 MG TBDP Take by mouth. 10/03/19   [provider]  tiZANidine (ZANAFLEX) 2 MG tablet Take 2 mg by mouth at bedtime.    [provider]  torsemide (DEMADEX) 10 MG tablet Take 1 tablet (10 mg total) by mouth daily. 02/26/22   Burchette, Elberta Fortis, MD  vitamin E 400 UNIT capsule Take 400 Units by mouth daily at 6 PM.    [provider]      Allergies    Cefazolin, Baclofen, Carbamazepine, Duloxetine, Fentanyl, Gabapentin, Naproxen, Oxcarbazepine, Pregabalin, Serotonin reuptake inhibitors (ssris), Topiramate, Venlafaxine, Zonisamide, Ceclor [cefaclor], Milnacipran, and Ondansetron    Review of Systems   Review of Systems  All other systems reviewed and are negative.   Physical Exam Updated Vital Signs BP 135/60   Pulse 69   Temp (!) 97.5 F (36.4 C) (Oral)   Resp 14   Ht 5\' 2"  (1.575 m)   Wt 111.1 kg   LMP 07/26/1992   SpO2 95%   BMI 44.81 kg/m  Physical Exam Vitals and nursing note reviewed.  Constitutional:      General: She is not in acute distress.    Appearance: She is well-developed.  HENT:     Head: Normocephalic and atraumatic.  Eyes:      Conjunctiva/sclera: Conjunctivae normal.  Cardiovascular:     Rate and Rhythm: Normal rate and regular rhythm.     Pulses: Normal pulses.  Pulmonary:     Effort: Pulmonary effort is normal. No respiratory distress.     Breath sounds: Normal breath sounds.  Abdominal:     Palpations: Abdomen is soft.     Tenderness: There is no abdominal tenderness.  Musculoskeletal:        General: Tenderness present.     Cervical back: Neck supple.     Right lower leg: Edema present.     Left lower leg: Edema present.     Comments: Bilateral LE TTP, 2+ DP pulses, very trace edema bilaterally  Skin:    General: Skin is warm and dry.     Capillary Refill:  Capillary refill takes less than 2 seconds.  Neurological:     Mental Status: She is alert.  Psychiatric:        Mood and Affect: Mood normal.     ED Results / Procedures / Treatments   Labs (all labs ordered are listed, but only abnormal results are displayed) Labs Reviewed  CBC WITH DIFFERENTIAL/PLATELET - Abnormal; Notable for the following components:      Result Value   RBC 3.63 (*)    Hemoglobin 10.9 (*)    HCT 33.3 (*)    All other components within normal limits  BASIC METABOLIC PANEL - Abnormal; Notable for the following components:   Creatinine, Ser 1.06 (*)    Calcium 8.3 (*)    GFR, Estimated 57 (*)    All other components within normal limits  BRAIN NATRIURETIC PEPTIDE  CK  MAGNESIUM  CBC WITH DIFFERENTIAL/PLATELET  TROPONIN I (HIGH SENSITIVITY)  TROPONIN I (HIGH SENSITIVITY)    EKG EKG Interpretation  Date/Time:  Wednesday Dec 22 2022 15:05:53 EDT Ventricular Rate:  80 PR Interval:  134 QRS Duration: 96 QT Interval:  363 QTC Calculation: 419 R Axis:   72 Text Interpretation: Sinus rhythm Low voltage, precordial leads Confirmed by Ernie Avena ([id number]) on 12/22/2022 3:29:53 PM  Radiology US Venous Img Lower Bilateral  Result Date: 12/22/2022 CLINICAL DATA:  Lower extremity pain and swelling. EXAM: Bilateral  LOWER EXTREMITY VENOUS DOPPLER ULTRASOUND TECHNIQUE: Gray-scale sonography with compression, as well as color and duplex ultrasound, were performed to evaluate the deep venous system(s) from the level of the common femoral vein through the popliteal and proximal calf veins. COMPARISON:  09/18/2021 right lower extremity ultrasound FINDINGS: VENOUS Normal compressibility of the common femoral, superficial femoral, and popliteal veins, as well as the visualized calf veins. Visualized portions of profunda femoral vein and great saphenous vein unremarkable. No filling defects to suggest DVT on grayscale or color Doppler imaging. Doppler waveforms show normal direction of venous flow, normal respiratory plasticity and response to augmentation. Limited views of the contralateral common femoral vein are unremarkable. OTHER None. Limitations: none IMPRESSION: No evidence of bilateral lower extremity DVT. Electronically Signed   By: Karen Kays M.D.   On: 12/22/2022 17:49   DG Chest 2 View  Result Date: 12/22/2022 CLINICAL DATA:  Shortness of breath EXAM: CHEST - 2 VIEW COMPARISON:  X-ray 07/15/2022 and older FINDINGS: Slight linear opacity left lung base likely scar or atelectasis. No consolidation, pneumothorax or effusion. Normal cardiopericardial silhouette without edema. Mild degenerative changes along the spine. Overlapping cardiac leads. Widened right AC joint. Please correlate with clinical history. Unchanged from previous. IMPRESSION: Mild left basilar scar or atelectasis. No acute cardiopulmonary disease. Electronically Signed   By: Karen Kays M.D.   On: 12/22/2022 16:57    Procedures Procedures    Medications Ordered in ED Medications - No data to display  ED Course/ Medical Decision Making/ A&P                             Medical Decision Making Amount and/or Complexity of Data Reviewed Labs: ordered. Radiology: ordered.    72 year old female with medical history significant for anxiety,  depression, trigeminal neuralgia, occipital neuralgia, fibromyalgia, history of DVT in 2009 not on anticoagulation, COPD, HLD, reflex sympathetic dystrophy who presents to the emergency department with leg pain and cramping with associated swelling.  The patient is worried that she has a recurrent DVT.  She has  noticed pain and cramping for the last few days with associated swelling.  She endorses it bilaterally but feels that symptoms are more significant in the right lower extremity.  She is on 10 mg of oral Lasix for occasional swelling and took an extra dose last week.  She was wondering also if she could be experiencing cramping due to electrolyte abnormalities.  She is on potassium supplementation outpatient.  She additionally endorses mild shortness of breath, endorses some mild substernal chest pressure without radiation that started intermittently today.  No sharp pleuritic chest pain.  No cough fever or chills.  On arrival, the patient was vitally stable.  Differential diagnose includes electrolyte abnormality, muscle spasm/cramping, DVT, lower extremity edema from new onset heart failure, less likely ACS, PE.  Initial EKG revealed sinus rhythm, ventricular rate 8 0, no acute ischemic changes noted.  CXR:  IMPRESSION:  Mild left basilar scar or atelectasis. No acute cardiopulmonary  disease.    DVT US: IMPRESSION:  No evidence of bilateral lower extremity DVT.   Labs: Troponins x 2 negative, CBC without leukocytosis, mild anemia to 10.9, BMP without significant electrolyte abnormality, BNP normal, magnesium normal, CK normal.  Patient symptoms significant for likely musculoskeletal pain and cramping in the setting of the patient's chronic pain.  Recommended continued outpatient pain regimen, outpatient follow-up with her PCP.  DC Instructions: Your cardiac workup was reassuring as was your laboratory evaluation with no evidence of electrolyte abnormality.  Your ultrasound was negative  for blood clot in both legs.  Your symptoms are likely consistent with musculoskeletal pain in the setting of your history of chronic pain.  Recommend continued outpatient pain control per your outpatient pain regimen.    Final Clinical Impression(s) / ED Diagnoses Final diagnoses:  Pain in both lower extremities    Rx / DC Orders ED Discharge Orders     None         Ernie Avena, MD 12/22/22 1902

## 2022-12-22 NOTE — ED Triage Notes (Signed)
Pt here  from home with c/o right leg pain times 1 week  , some slight swelling , no redness,  hx of clot in that same leg

## 2022-12-29 ENCOUNTER — Encounter: Payer: Self-pay | Admitting: Family Medicine

## 2022-12-31 NOTE — Telephone Encounter (Signed)
Pt sched appt w/ Dr. Clent Ridges since pt couldn't wait until PCP had availability.   FYI.

## 2023-01-03 ENCOUNTER — Encounter: Payer: Self-pay | Admitting: Family Medicine

## 2023-01-03 ENCOUNTER — Ambulatory Visit (INDEPENDENT_AMBULATORY_CARE_PROVIDER_SITE_OTHER): Payer: Medicare Other | Admitting: Family Medicine

## 2023-01-03 VITALS — BP 120/70 | HR 75 | Temp 98.3°F | Wt 248.0 lb

## 2023-01-03 DIAGNOSIS — R600 Localized edema: Secondary | ICD-10-CM | POA: Diagnosis not present

## 2023-01-03 DIAGNOSIS — R5383 Other fatigue: Secondary | ICD-10-CM | POA: Diagnosis not present

## 2023-01-03 DIAGNOSIS — J441 Chronic obstructive pulmonary disease with (acute) exacerbation: Secondary | ICD-10-CM

## 2023-01-03 DIAGNOSIS — D649 Anemia, unspecified: Secondary | ICD-10-CM | POA: Diagnosis not present

## 2023-01-03 NOTE — Progress Notes (Addendum)
   Subjective:    Patient ID: Ashley Castaneda, female    DOB: 01/29/1951, 72 y.o.   MRN: [id number]  HPI Here to follow up an ED visit on 12-22-22 for swelling and cramping in both legs and for mild SOB. No fever or coughing. She has a hx of DVT, so she was worried that she might have more blood clots. At the ED her CXR was clear, and venous dopplers of both legs were negative for DVT. She has chronic edema in both legs, and she takes Torsemide 10 mg daily. She also takes a second Torsemide as needed (which amounts to 3 or 4 days a month). Her labs were significant for a low Hgb of 10.9 (this was 13.3 in December). Her BNP was normal at 47.8. Her creatinine was 1.06, and her GFR was 57. Since then she says the swelling in her legs has improved, and the SOB went away. She admits to feeling very tired all the time.    Review of Systems  Constitutional:  Positive for fatigue.  Respiratory: Negative.    Cardiovascular:  Positive for leg swelling. Negative for chest pain and palpitations.  Gastrointestinal: Negative.   Genitourinary: Negative.        Objective:   Physical Exam Constitutional:      General: She is not in acute distress.    Appearance: She is obese.  Cardiovascular:     Rate and Rhythm: Normal rate and regular rhythm.     Pulses: Normal pulses.     Heart sounds: Normal heart sounds.  Pulmonary:     Effort: Pulmonary effort is normal.     Breath sounds: Normal breath sounds.  Musculoskeletal:     Comments: 2+ edema in both legs   Neurological:     Mental Status: She is alert.           Assessment & Plan:  Her leg swelling is a chronic problem, and this seems to be no worse than normal. At least no DVT was found. Her COPD seems to be at its baseline. The main issue is a new diagnosis of anemia. She denies seeing any dark or bloody stools. Her colonoscopy in 2016 showed some internal hemorrhoids but no polyps. We will recheck a CBC today, as well as an iron, ferritin,  folate, and B12. We spent a total of (35   ) minutes reviewing records and discussing these issues.  Gershon Crane, MD

## 2023-01-04 ENCOUNTER — Other Ambulatory Visit: Payer: Self-pay | Admitting: Family Medicine

## 2023-01-04 LAB — CBC WITH DIFFERENTIAL/PLATELET
Basophils Absolute: 0.1 10*3/uL (ref 0.0–0.1)
Basophils Relative: 1 % (ref 0.0–3.0)
Eosinophils Absolute: 0.2 10*3/uL (ref 0.0–0.7)
Eosinophils Relative: 2.4 % (ref 0.0–5.0)
HCT: 39.3 % (ref 36.0–46.0)
Hemoglobin: 12.6 g/dL (ref 12.0–15.0)
Lymphocytes Relative: 16.4 % (ref 12.0–46.0)
Lymphs Abs: 1.3 10*3/uL (ref 0.7–4.0)
MCHC: 32.1 g/dL (ref 30.0–36.0)
MCV: 91.7 fl (ref 78.0–100.0)
Monocytes Absolute: 0.8 10*3/uL (ref 0.1–1.0)
Monocytes Relative: 9.8 % (ref 3.0–12.0)
Neutro Abs: 5.6 10*3/uL (ref 1.4–7.7)
Neutrophils Relative %: 70.4 % (ref 43.0–77.0)
Platelets: 368 10*3/uL (ref 150.0–400.0)
RBC: 4.28 Mil/uL (ref 3.87–5.11)
RDW: 14.1 % (ref 11.5–15.5)
WBC: 8 10*3/uL (ref 4.0–10.5)

## 2023-01-04 LAB — IBC + FERRITIN
Ferritin: 16.1 ng/mL (ref 10.0–291.0)
Iron: 128 ug/dL (ref 42–145)
Saturation Ratios: 26.1 % (ref 20.0–50.0)
TIBC: 490 ug/dL — ABNORMAL HIGH (ref 250.0–450.0)
Transferrin: 350 mg/dL (ref 212.0–360.0)

## 2023-01-04 LAB — VITAMIN B12: Vitamin B-12: 568 pg/mL (ref 211–911)

## 2023-01-04 LAB — FOLATE: Folate: >23.8 ng/mL (ref >5.9)

## 2023-01-04 MED ORDER — TORSEMIDE 10 MG PO TABS: 10.0000 mg | ORAL_TABLET | Freq: Every day | ORAL | 3 refills | Status: DC

## 2023-01-04 MED ORDER — ALPRAZOLAM 0.5 MG PO TABS: 0.5000 mg | ORAL_TABLET | Freq: Two times a day (BID) | ORAL | 1 refills | Status: DC

## 2023-01-04 MED ORDER — POTASSIUM CHLORIDE ER 10 MEQ PO TBCR: EXTENDED_RELEASE_TABLET | ORAL | 3 refills | Status: DC

## 2023-01-04 NOTE — Telephone Encounter (Signed)
Pt needed refills for Demadex, potassium supplement, and Alprazolam  Recent electrolytes stable.   Refilled demadex and postassium for one year and Alprazolam for 6 months.   Kristian Covey MD [facility]

## 2023-01-14 ENCOUNTER — Ambulatory Visit (INDEPENDENT_AMBULATORY_CARE_PROVIDER_SITE_OTHER): Payer: Medicare Other | Admitting: Family Medicine

## 2023-01-14 ENCOUNTER — Encounter: Payer: Self-pay | Admitting: Family Medicine

## 2023-01-14 VITALS — BP 120/66 | HR 77 | Temp 97.9°F | Ht 62.0 in | Wt 245.1 lb

## 2023-01-14 DIAGNOSIS — M170 Bilateral primary osteoarthritis of knee: Secondary | ICD-10-CM | POA: Diagnosis not present

## 2023-01-14 DIAGNOSIS — D649 Anemia, unspecified: Secondary | ICD-10-CM

## 2023-01-14 DIAGNOSIS — R5383 Other fatigue: Secondary | ICD-10-CM | POA: Diagnosis not present

## 2023-01-14 LAB — TSH: TSH: 2.25 u[IU]/mL (ref 0.35–5.50)

## 2023-01-14 NOTE — Progress Notes (Unsigned)
Established Patient Office Visit  Subjective   Patient ID: Ashley Castaneda, female    DOB: 1950/11/06  Age: 72 y.o. MRN: [id number]  Chief Complaint  Patient presents with   Fatigue   Shortness of Breath    Patient complains of shortness of breath upon exertion     HPI  {History (Optional):23778} Darel Hong has multiple chronic problems as previously outlined.  She had some recent issues with fatigue and right leg pain.  This prompted ER visit May 29.  She had Doppler which ruled out DVT.  Multiple labs were done.  She had hemoglobin 10.9.  Subsequently seen here in follow-up and repeat CBC hemoglobin 12.6.  Iron studies, B12, folate all normal.  Her husband has multiple chronic medical problems and she feels like stress of caring for him is a big part of her fatigue possibly.  She is trying to get help in the home for him currently.  She had frustrations trying to lose weight but is not able to do much physically in terms of exercise and also very poor diet at times.  Frequent snacking.  She states she also has a "sweet tooth ".  She is requesting a rollator walker.  She has difficulty ambulating long distances without rest.  Significant arthritis pain.  She is followed by multiple specialist including neurology and rheumatology.  Past Medical History:  Diagnosis Date   Adenomatous colon polyp 09/23/2008   ALLERGIC RHINITIS 07/01/2008   ANXIETY 07/01/2008   BACK PAIN, THORACIC REGION 03/04/2010   BRONCHITIS, CHRONIC 07/01/2008   CARPAL TUNNEL SYNDROME, HX OF 07/01/2008   CHRONIC OBSTRUCTIVE PULMONARY DISEASE, ACUTE EXACERBATION 08/07/2010   COPD (chronic obstructive pulmonary disease) (HCC)    DEPRESSION 07/01/2008   DVT, HX OF 07/01/2008   ECZEMA 05/29/2009   FACIAL PAIN 12/25/2009   FIBROMYALGIA 07/01/2008   GERD 07/01/2008   Hiatal hernia    Hyperlipidemia    LEG PAIN, BILATERAL 07/01/2008   MIGRAINES, HX OF 07/01/2008   OCCIPITAL NEURALGIA 07/01/2008   OSTEOARTHRITIS  07/01/2008   Other specified trigeminal nerve disorders 07/01/2008   REFLEX SYMPATHETIC DYSTROPHY 07/01/2008   Trigeminal neuralgia 05/21/2009   Unspecified vitamin D deficiency 03/31/2010   Past Surgical History:  Procedure Laterality Date   CARPAL TUNNEL RELEASE  07/27/2007   RIGHT   EYE SURGERY     HEAD CRANIECTOMY  07/26/1992   MICROVASULAR DECOMPRESSION   LAVH     BSO   LEFT KNEE SURGERY  07/27/1987   OCCIPITAL NERVE STIMULATOR INSERTION     RIGHT FOOT BONE SPUR  07/26/1985   RIGHT FOOT SURGERY  07/26/1996   BAD CUT   RIGHT JAW SURGERY  07/26/2000   RIGHT KNEE SURGERY  07/26/1988   RIGHT SOULDER SURGERY  1992, 1993, 1995, 1999   RIGHT THUMB SUGERY  07/26/2002   tear duct surgery     TONSILLECTOMY  07/27/1955    reports that she quit smoking about 10 years ago. Her smoking use included cigarettes. She started smoking about 37 years ago. She has a 60.00 pack-year smoking history. She has been exposed to tobacco smoke. She has never used smokeless tobacco. She reports that she does not drink alcohol and does not use drugs. family history includes Heart attack in her father and maternal grandmother; Hypertension in her father; Ovarian cancer (age of onset: 53) in her mother; Prostate cancer in her father; Stroke in her paternal grandmother. Allergies  Allergen Reactions   Cefazolin Anaphylaxis, Swelling and Other (See Comments)  Ancef - in surgery, swelled up, turned blue, heart problems Turns Blue   Baclofen Other (See Comments)    REACTION: confusion REACTION: confusion REACTION: confusion REACTION: confusion   Carbamazepine Other (See Comments)    REACTION: confusion Other reaction(s): Confusion Confusion,Bad reactions to all seizure medicine   Duloxetine Other (See Comments)   Fentanyl Nausea And Vomiting and Other (See Comments)    REACTION: nausea and vomiting   Gabapentin Other (See Comments)    REACTION: confusion   Naproxen Other (See Comments)    Elevates  blood pressure   Oxcarbazepine Other (See Comments)    REACTION: confusion Other reaction(s): Other REACTION: confusion REACTION: confusion REACTION: confusion    Pregabalin Other (See Comments)    Other reaction(s): Other   Serotonin Reuptake Inhibitors (Ssris) Other (See Comments)   Topiramate Other (See Comments)    Other reaction(s): Unknown   Venlafaxine Other (See Comments)    Other reaction(s): Unknown   Zonisamide Other (See Comments)    Other reaction(s): Other   Ceclor [Cefaclor]     hives   Milnacipran Other (See Comments)   Ondansetron Nausea And Vomiting    Nausea and vomiting after IV administration    Review of Systems  Constitutional:  Positive for malaise/fatigue. Negative for chills and fever.  Eyes:  Negative for blurred vision.  Respiratory:  Negative for shortness of breath.   Cardiovascular:  Negative for chest pain.  Gastrointestinal:  Negative for abdominal pain.  Neurological:  Negative for dizziness, weakness and headaches.      Objective:     BP 120/66 (BP Location: Left Arm, Patient Position: Sitting, Cuff Size: Large)   Pulse 77   Temp 97.9 F (36.6 C) (Oral)   Ht 5\' 2"  (1.575 m)   Wt 245 lb 1.6 oz (111.2 kg)   LMP 07/26/1992   SpO2 99%   BMI 44.83 kg/m  {Vitals History (Optional):23777}  Physical Exam Vitals reviewed.  Constitutional:      Appearance: She is well-developed.  Eyes:     Pupils: Pupils are equal, round, and reactive to light.  Neck:     Thyroid: No thyromegaly.     Vascular: No JVD.  Cardiovascular:     Rate and Rhythm: Normal rate and regular rhythm.     Heart sounds:     No gallop.  Pulmonary:     Effort: Pulmonary effort is normal. No respiratory distress.     Breath sounds: Normal breath sounds. No wheezing or rales.  Musculoskeletal:     Cervical back: Neck supple.     Right lower leg: No edema.     Left lower leg: No edema.     Comments: No pitting edema at this time  Neurological:     Mental  Status: She is alert.      Results for orders placed or performed in visit on 01/14/23  TSH  Result Value Ref Range   TSH 2.25 0.35 - 5.50 uIU/mL    {Labs (Optional):23779}  The 10-year ASCVD risk score (Arnett DK, et al., 2019) is: 19.7%    Assessment & Plan:   #1 ongoing fatigue.  Likely multifactorial.  Suspect a lot of this is related to increased stress issues and weight gain.  Recommend check TSH.  Recent repeat hemoglobin normal.  No evidence for iron deficiency.  She been studied twice previously for sleep apnea with relatively normal studies. -Strongly encouraged her to try to lose some weight.  Discussed weight loss strategies.  #2 recent  right posterior thigh and leg pain with Doppler negative for DVT.  #3 osteoarthritis involving multiple joints especially knees.  Prescription for rollator walker given.  She does have some increased risk for falls.   No follow-ups on file.    Evelena Peat, MD

## 2023-02-02 DIAGNOSIS — Z961 Presence of intraocular lens: Secondary | ICD-10-CM | POA: Diagnosis not present

## 2023-02-02 DIAGNOSIS — G514 Facial myokymia: Secondary | ICD-10-CM | POA: Diagnosis not present

## 2023-02-02 DIAGNOSIS — H04123 Dry eye syndrome of bilateral lacrimal glands: Secondary | ICD-10-CM | POA: Diagnosis not present

## 2023-02-07 DIAGNOSIS — G629 Polyneuropathy, unspecified: Secondary | ICD-10-CM | POA: Diagnosis not present

## 2023-02-24 DIAGNOSIS — G43719 Chronic migraine without aura, intractable, without status migrainosus: Secondary | ICD-10-CM | POA: Diagnosis not present

## 2023-05-02 DIAGNOSIS — M62838 Other muscle spasm: Secondary | ICD-10-CM | POA: Diagnosis not present

## 2023-05-02 DIAGNOSIS — G43719 Chronic migraine without aura, intractable, without status migrainosus: Secondary | ICD-10-CM | POA: Diagnosis not present

## 2023-05-02 DIAGNOSIS — G43119 Migraine with aura, intractable, without status migrainosus: Secondary | ICD-10-CM | POA: Diagnosis not present

## 2023-05-02 DIAGNOSIS — B0229 Other postherpetic nervous system involvement: Secondary | ICD-10-CM | POA: Diagnosis not present

## 2023-05-02 DIAGNOSIS — G509 Disorder of trigeminal nerve, unspecified: Secondary | ICD-10-CM | POA: Diagnosis not present

## 2023-05-02 DIAGNOSIS — R52 Pain, unspecified: Secondary | ICD-10-CM | POA: Diagnosis not present

## 2023-05-02 DIAGNOSIS — G932 Benign intracranial hypertension: Secondary | ICD-10-CM | POA: Diagnosis not present

## 2023-05-02 DIAGNOSIS — H04123 Dry eye syndrome of bilateral lacrimal glands: Secondary | ICD-10-CM | POA: Diagnosis not present

## 2023-05-02 DIAGNOSIS — G4701 Insomnia due to medical condition: Secondary | ICD-10-CM | POA: Diagnosis not present

## 2023-05-02 DIAGNOSIS — G8929 Other chronic pain: Secondary | ICD-10-CM | POA: Diagnosis not present

## 2023-05-02 DIAGNOSIS — G9050 Complex regional pain syndrome I, unspecified: Secondary | ICD-10-CM | POA: Diagnosis not present

## 2023-05-02 DIAGNOSIS — G4486 Cervicogenic headache: Secondary | ICD-10-CM | POA: Diagnosis not present

## 2023-05-05 ENCOUNTER — Ambulatory Visit: Payer: Medicare Other | Admitting: Family Medicine

## 2023-05-05 ENCOUNTER — Encounter: Payer: Self-pay | Admitting: Family Medicine

## 2023-05-05 VITALS — BP 132/80 | HR 93 | Temp 98.0°F | Ht 62.0 in | Wt 244.0 lb

## 2023-05-05 DIAGNOSIS — J069 Acute upper respiratory infection, unspecified: Secondary | ICD-10-CM | POA: Diagnosis not present

## 2023-05-05 DIAGNOSIS — R051 Acute cough: Secondary | ICD-10-CM | POA: Diagnosis not present

## 2023-05-05 LAB — POC COVID19 BINAXNOW: SARS Coronavirus 2 Ag: NEGATIVE

## 2023-05-05 LAB — POCT RAPID STREP A (OFFICE): Rapid Strep A Screen: NEGATIVE

## 2023-05-05 LAB — POCT INFLUENZA A/B
Influenza A, POC: NEGATIVE
Influenza B, POC: NEGATIVE

## 2023-05-05 MED ORDER — BENZONATATE 100 MG PO CAPS
100.0000 mg | ORAL_CAPSULE | Freq: Two times a day (BID) | ORAL | 0 refills | Status: DC | PRN
Start: 2023-05-05 — End: 2024-05-07

## 2023-05-05 NOTE — Progress Notes (Signed)
Established Patient Office Visit   Subjective  Patient ID: Ashley Castaneda, female    DOB: [birth year]  Age: 72 y.o. MRN: [id number]  Chief Complaint  Patient presents with   Cough    head cold and sore throat/cough x 4 days, congestion chest is burning     Patient is a 72 year old female followed by Dr. Caryl Never who is seen for acute concern.  Patient endorses cough, congestion, scratchy throat x 4 days.  Patient endorses burning sensation in chest with coughing.  Denies fever, headache, chills, nausea, vomiting, loose stools, facial pain/pressure, ear pain/pressure.  Does not want symptoms to be having bronchitis.  Also concerned about getting her husband sick who has atypical ALS.  Since Monday patient has tried throat spray, Mucinex, Flonase, elderberry, lemon water with honey, heat on chest.   Patient Active Problem List   Diagnosis Date Noted   Chronic rhinitis 10/13/2021   Chronic obstructive pulmonary disease (HCC) 10/13/2021   Encounter for Medicare annual wellness exam 07/07/2020   Status post right cataract extraction 04/10/2020   DOE (dyspnea on exertion) 10/17/2019   Recurrent aphthous ulcer 03/27/2019   Chronic bilateral low back pain without sciatica 02/09/2019   Pain in surgical scar 08/01/2018   Bilateral leg edema 09/28/2017   Infected surgical wound 09/14/2017   Status post insertion of spinal cord stimulator 09/14/2017   Obesity 10/08/2015   Migraine headache 01/18/2014   Dizziness and giddiness 01/18/2014   Numbness and tingling of both legs 01/18/2014   Severe obesity (BMI >= 40) (HCC) 06/14/2013   Numbness and tingling in right hand 05/30/2013   Hyperlipidemia 06/06/2012   Fatigue 06/06/2012   Dvt femoral (deep venous thrombosis) (HCC) 10/28/2011   CHRONIC OBSTRUCTIVE PULMONARY DISEASE, ACUTE EXACERBATION 08/07/2010   UNSPECIFIED VITAMIN D DEFICIENCY 03/31/2010   BACK PAIN, THORACIC REGION 03/04/2010   ECZEMA 05/29/2009   TRIGEMINAL NEURALGIA 05/21/2009    Anxiety state 07/01/2008   DEPRESSION 07/01/2008   REFLEX SYMPATHETIC DYSTROPHY 07/01/2008   OTHER SPECIFIED TRIGEMINAL NERVE DISORDERS 07/01/2008   Allergic rhinitis 07/01/2008   BRONCHITIS, CHRONIC 07/01/2008   GERD 07/01/2008   OSTEOARTHRITIS 07/01/2008   OCCIPITAL NEURALGIA 07/01/2008   FIBROMYALGIA 07/01/2008   CARPAL TUNNEL SYNDROME, HX OF 07/01/2008   MIGRAINES, HX OF 07/01/2008   Past Medical History:  Diagnosis Date   Adenomatous colon polyp 09/23/2008   ALLERGIC RHINITIS 07/01/2008   ANXIETY 07/01/2008   BACK PAIN, THORACIC REGION 03/04/2010   BRONCHITIS, CHRONIC 07/01/2008   CARPAL TUNNEL SYNDROME, HX OF 07/01/2008   CHRONIC OBSTRUCTIVE PULMONARY DISEASE, ACUTE EXACERBATION 08/07/2010   COPD (chronic obstructive pulmonary disease) (HCC)    DEPRESSION 07/01/2008   DVT, HX OF 07/01/2008   ECZEMA 05/29/2009   FACIAL PAIN 12/25/2009   FIBROMYALGIA 07/01/2008   GERD 07/01/2008   Hiatal hernia    Hyperlipidemia    LEG PAIN, BILATERAL 07/01/2008   MIGRAINES, HX OF 07/01/2008   OCCIPITAL NEURALGIA 07/01/2008   OSTEOARTHRITIS 07/01/2008   Other specified trigeminal nerve disorders 07/01/2008   REFLEX SYMPATHETIC DYSTROPHY 07/01/2008   Trigeminal neuralgia 05/21/2009   Unspecified vitamin D deficiency 03/31/2010   Past Surgical History:  Procedure Laterality Date   CARPAL TUNNEL RELEASE  07/27/2007   RIGHT   EYE SURGERY     HEAD CRANIECTOMY  07/26/1992   MICROVASULAR DECOMPRESSION   LAVH     BSO   LEFT KNEE SURGERY  07/27/1987   OCCIPITAL NERVE STIMULATOR INSERTION     RIGHT FOOT BONE SPUR  07/26/1985  RIGHT FOOT SURGERY  07/26/1996   BAD CUT   RIGHT JAW SURGERY  07/26/2000   RIGHT KNEE SURGERY  07/26/1988   RIGHT SOULDER SURGERY  1992, 1993, 1995, 1999   RIGHT THUMB SUGERY  07/26/2002   tear duct surgery     TONSILLECTOMY  07/27/1955   Social History   Tobacco Use   Smoking status: Former    Current packs/day: 0.00    Average packs/day: 2.0  packs/day for 30.0 years (60.1 ttl pk-yrs)    Types: Cigarettes    Start date: 60    Quit date: 02/04/2012    Years since quitting: 11.2    Passive exposure: Current   Smokeless tobacco: Never   Tobacco comments:    nicotine in vapor and is very low nicotine   Vaping Use   Vaping status: Every Day  Substance Use Topics   Alcohol use: No    Alcohol/week: 0.0 standard drinks of alcohol    Comment: quit 2000   Drug use: No   Family History  Problem Relation Age of Onset   Ovarian cancer Mother 76   Hypertension Father    Prostate cancer Father    Heart attack Father    Heart attack Maternal Grandmother    Stroke Paternal Grandmother    Allergies  Allergen Reactions   Cefazolin Anaphylaxis, Swelling and Other (See Comments)    Ancef - in surgery, swelled up, turned blue, heart problems Turns Blue   Baclofen Other (See Comments)    REACTION: confusion REACTION: confusion REACTION: confusion REACTION: confusion   Carbamazepine Other (See Comments)    REACTION: confusion Other reaction(s): Confusion Confusion,Bad reactions to all seizure medicine   Duloxetine Other (See Comments)   Fentanyl Nausea And Vomiting and Other (See Comments)    REACTION: nausea and vomiting   Gabapentin Other (See Comments)    REACTION: confusion   Naproxen Other (See Comments)    Elevates blood pressure   Oxcarbazepine Other (See Comments)    REACTION: confusion Other reaction(s): Other REACTION: confusion REACTION: confusion REACTION: confusion    Pregabalin Other (See Comments)    Other reaction(s): Other   Serotonin Reuptake Inhibitors (Ssris) Other (See Comments)   Topiramate Other (See Comments)    Other reaction(s): Unknown   Venlafaxine Other (See Comments)    Other reaction(s): Unknown   Zonisamide Other (See Comments)    Other reaction(s): Other   Ceclor [Cefaclor]     hives   Milnacipran Other (See Comments)   Ondansetron Nausea And Vomiting    Nausea and vomiting  after IV administration      ROS Negative unless stated above    Objective:     BP 132/80 (BP Location: Left Arm, Patient Position: Sitting, Cuff Size: Large)   Pulse 93   Temp 98 F (36.7 C) (Oral)   Ht 5\' 2"  (1.575 m)   Wt 244 lb (110.7 kg)   LMP  (LMP Unknown)   SpO2 94%   BMI 44.63 kg/m  BP Readings from Last 3 Encounters:  05/05/23 132/80  01/14/23 120/66  01/03/23 120/70   Wt Readings from Last 3 Encounters:  05/05/23 244 lb (110.7 kg)  01/14/23 245 lb 1.6 oz (111.2 kg)  01/03/23 248 lb (112.5 kg)      Physical Exam Constitutional:      General: She is not in acute distress.    Appearance: Normal appearance.  HENT:     Head: Normocephalic and atraumatic.     Nose: Nose normal.  Mouth/Throat:     Mouth: Mucous membranes are moist.  Cardiovascular:     Rate and Rhythm: Normal rate and regular rhythm.     Heart sounds: Normal heart sounds. No murmur heard.    No gallop.  Pulmonary:     Effort: Pulmonary effort is normal. No respiratory distress.     Breath sounds: Normal breath sounds. No wheezing, rhonchi or rales.  Skin:    General: Skin is warm and dry.  Neurological:     Mental Status: She is alert and oriented to person, place, and time.     Results for orders placed or performed in visit on 05/05/23  POC COVID-19 BinaxNow  Result Value Ref Range   SARS Coronavirus 2 Ag Negative Negative  POC Influenza A/B  Result Value Ref Range   Influenza A, POC Negative Negative   Influenza B, POC Negative Negative  POC Rapid Strep A  Result Value Ref Range   Rapid Strep A Screen Negative Negative      Assessment & Plan:  Viral URI  Acute cough -     POC COVID-19 BinaxNow -     POCT Influenza A/B -     POCT rapid strep A -     Benzonatate; Take 1 capsule (100 mg total) by mouth 2 (two) times daily as needed for cough.  Dispense: 20 capsule; Refill: 0  Acute viral URI symptoms with cough.  POC COVID, flu, strep testing negative in clinic.   Rx for Tessalon sent to pharmacy.  Supportive care with OTC cough/cold medications, rest, hydration, Flonase/allergy medicines, Tylenol as needed.  Discussed hand hygiene to prevent spreading illness.  Given strict precautions.  Return if symptoms worsen or fail to improve.   Deeann Saint, MD

## 2023-05-27 DIAGNOSIS — G43719 Chronic migraine without aura, intractable, without status migrainosus: Secondary | ICD-10-CM | POA: Diagnosis not present

## 2023-06-15 DIAGNOSIS — M359 Systemic involvement of connective tissue, unspecified: Secondary | ICD-10-CM | POA: Diagnosis not present

## 2023-06-15 DIAGNOSIS — G629 Polyneuropathy, unspecified: Secondary | ICD-10-CM | POA: Diagnosis not present

## 2023-06-15 DIAGNOSIS — M797 Fibromyalgia: Secondary | ICD-10-CM | POA: Diagnosis not present

## 2023-06-15 DIAGNOSIS — M350C Sjogren syndrome with dental involvement: Secondary | ICD-10-CM | POA: Diagnosis not present

## 2023-06-20 ENCOUNTER — Encounter: Payer: Self-pay | Admitting: Family Medicine

## 2023-06-21 MED ORDER — RABEPRAZOLE SODIUM 20 MG PO TBEC: 20.0000 mg | DELAYED_RELEASE_TABLET | Freq: Every day | ORAL | 1 refills | Status: DC

## 2023-06-21 MED ORDER — MELOXICAM 15 MG PO TABS: 15.0000 mg | ORAL_TABLET | Freq: Every day | ORAL | 0 refills | Status: DC

## 2023-06-21 MED ORDER — FLUTICASONE FUROATE-VILANTEROL 200-25 MCG/ACT IN AEPB: 1.0000 | INHALATION_SPRAY | Freq: Every day | RESPIRATORY_TRACT | 11 refills | Status: AC

## 2023-06-21 MED ORDER — ALPRAZOLAM 0.5 MG PO TABS: 0.5000 mg | ORAL_TABLET | Freq: Two times a day (BID) | ORAL | 1 refills | Status: DC

## 2023-07-06 DIAGNOSIS — Z1231 Encounter for screening mammogram for malignant neoplasm of breast: Secondary | ICD-10-CM | POA: Diagnosis not present

## 2023-07-06 LAB — HM MAMMOGRAPHY

## 2023-07-07 ENCOUNTER — Encounter: Payer: Self-pay | Admitting: Family Medicine

## 2023-07-13 DIAGNOSIS — Z961 Presence of intraocular lens: Secondary | ICD-10-CM | POA: Diagnosis not present

## 2023-07-13 DIAGNOSIS — G514 Facial myokymia: Secondary | ICD-10-CM | POA: Diagnosis not present

## 2023-07-13 DIAGNOSIS — H04123 Dry eye syndrome of bilateral lacrimal glands: Secondary | ICD-10-CM | POA: Diagnosis not present

## 2023-07-15 DIAGNOSIS — M359 Systemic involvement of connective tissue, unspecified: Secondary | ICD-10-CM | POA: Diagnosis not present

## 2023-07-15 DIAGNOSIS — G629 Polyneuropathy, unspecified: Secondary | ICD-10-CM | POA: Diagnosis not present

## 2023-07-15 DIAGNOSIS — M797 Fibromyalgia: Secondary | ICD-10-CM | POA: Diagnosis not present

## 2023-07-24 ENCOUNTER — Other Ambulatory Visit: Payer: Self-pay | Admitting: Family Medicine

## 2023-07-25 ENCOUNTER — Ambulatory Visit (INDEPENDENT_AMBULATORY_CARE_PROVIDER_SITE_OTHER): Payer: Medicare Other

## 2023-07-25 VITALS — Ht 62.0 in | Wt 244.0 lb

## 2023-07-25 DIAGNOSIS — Z122 Encounter for screening for malignant neoplasm of respiratory organs: Secondary | ICD-10-CM

## 2023-07-25 DIAGNOSIS — Z0000 Encounter for general adult medical examination without abnormal findings: Secondary | ICD-10-CM | POA: Diagnosis not present

## 2023-07-25 NOTE — Patient Instructions (Addendum)
Ashley Castaneda , Thank you for taking time to come for your Medicare Wellness Visit. I appreciate your ongoing commitment to your health goals. Please review the following plan we discussed and let me know if I can assist you in the future.   Referrals/Orders/Follow-Ups/Clinician Recommendations:   This is a list of the screening recommended for you and due dates:  Health Maintenance  Topic Date Due   Hepatitis C Screening  Never done   Screening for Lung Cancer  Never done   Zoster (Shingles) Vaccine (2 of 2) 02/12/2020   DTaP/Tdap/Td vaccine (2 - Tdap) 03/31/2020   COVID-19 Vaccine (7 - 2024-25 season) 03/27/2023   Mammogram  07/05/2024   Medicare Annual Wellness Visit  07/24/2024   Colon Cancer Screening  02/05/2025   Pneumonia Vaccine  Completed   Flu Shot  Completed   DEXA scan (bone density measurement)  Completed   HPV Vaccine  Aged Out    Advanced directives: (Declined) Advance directive discussed with you today. Even though you declined this today, please call our office should you change your mind, and we can give you the proper paperwork for you to fill out.  Next Medicare Annual Wellness Visit scheduled for next year: Yes

## 2023-07-25 NOTE — Progress Notes (Signed)
Subjective:   Ashley Castaneda is a 72 y.o. female who presents for Medicare Annual (Subsequent) preventive examination.  Visit Complete: Virtual I connected with  Ashley Castaneda on 07/25/23 by a audio enabled telemedicine application and verified that I am speaking with the correct person using two identifiers.  Patient Location: Home  Provider Location: Home Office  I discussed the limitations of evaluation and management by telemedicine. The patient expressed understanding and agreed to proceed.  Vital Signs: Because this visit was a virtual/telehealth visit, some criteria may be missing or patient reported. Any vitals not documented were not able to be obtained and vitals that have been documented are patient reported.    Cardiac Risk Factors include: advanced age (>48men, >39 women)     Objective:    Today's Vitals   07/25/23 1456  Weight: 244 lb (110.7 kg)  Height: 5\' 2"  (1.575 m)   Body mass index is 44.63 kg/m.     07/25/2023    3:07 PM 07/20/2022    3:25 PM 07/15/2022    3:32 PM 12/14/2021    6:33 PM 09/18/2021    3:08 PM 07/16/2021    2:49 PM 07/14/2020    1:39 PM  Advanced Directives  Does Patient Have a Medical Advance Directive? No No No No No No No  Would patient like information on creating a medical advance directive? No - Patient declined No - Patient declined  No - Patient declined  No - Patient declined No - Patient declined    Current Medications (verified) Outpatient Encounter Medications as of 07/25/2023  Medication Sig   albuterol (PROAIR HFA) 108 (90 Base) MCG/ACT inhaler Inhale 1-2 puffs into the lungs every 6 (six) hours as needed for wheezing or shortness of breath.   ALPRAZolam (XANAX) 0.5 MG tablet Take 1 tablet (0.5 mg total) by mouth 2 (two) times daily.   aspirin 325 MG tablet daily.   benzonatate (TESSALON) 100 MG capsule Take 1 capsule (100 mg total) by mouth 2 (two) times daily as needed for cough.   calcium carbonate (OS-CAL - DOSED  IN MG OF ELEMENTAL CALCIUM) 1250 MG tablet Take 1 tablet by mouth daily.   Calcium Citrate-Vitamin D (CAL-CITRATE PLUS VITAMIN D) 250-2.5 MG-MCG TABS Take 2 tablets by mouth 2 (two) times daily.   carboxymethylcellulose (REFRESH PLUS) 0.5 % SOLN Take 1 drop by mouth daily as needed. For dry eyes   cycloSPORINE (RESTASIS) 0.05 % ophthalmic emulsion Place 1 drop into both eyes 2 (two) times daily.   cycloSPORINE (RESTASIS) 0.05 % ophthalmic emulsion Place 1 drop into both eyes every 12 (twelve) hours.   Eptinezumab-jjmr (VYEPTI IV) Inject into the vein.   fluticasone (FLONASE) 50 MCG/ACT nasal spray Place 2 sprays into both nostrils daily as needed. For nasal congestion   fluticasone furoate-vilanterol (BREO ELLIPTA) 200-25 MCG/ACT AEPB Inhale 1 puff into the lungs daily.   frovatriptan (FROVA) 2.5 MG tablet Take 2.5 mg by mouth as needed. If recurs, may repeat after 2 hours. Max of 3 tabs in 24 hours. For migraines.   lidocaine (LIDODERM) 5 % Place 1 patch onto the skin daily. Remove & Discard patch within 12 hours or as directed by MD   meclizine (ANTIVERT) 25 MG tablet TAKE 1 TABLET BY MOUTH 4 TIMES A DAY AS NEEDED FOR DIZZINESS   meloxicam (MOBIC) 15 MG tablet Take 1 tablet (15 mg total) by mouth daily.   metaxalone (SKELAXIN) 800 MG tablet Take 800 mg by mouth 3 (three)  times daily.   montelukast (SINGULAIR) 10 MG tablet Take 1 tablet (10 mg total) by mouth at bedtime.   Multiple Vitamin (MULTIVITAMIN) tablet Take 1 tablet by mouth every morning.   Omega-3 Fatty Acids (FISH OIL) 1200 MG CAPS Take 1 capsule by mouth every evening.   potassium chloride (KLOR-CON) 10 MEQ tablet Take two tablets once daily   promethazine (PHENERGAN) 25 MG tablet Take 1 tablet (25 mg total) by mouth every 8 (eight) hours as needed for nausea or vomiting.   RABEprazole (ACIPHEX) 20 MG tablet Take 1 tablet (20 mg total) by mouth daily.   Rimegepant Sulfate (NURTEC) 75 MG TBDP Take by mouth.   tiZANidine (ZANAFLEX) 2  MG tablet Take 2 mg by mouth at bedtime.   torsemide (DEMADEX) 10 MG tablet Take 1 tablet (10 mg total) by mouth daily.   vitamin E 400 UNIT capsule Take 400 Units by mouth daily at 6 PM.   No facility-administered encounter medications on file as of 07/25/2023.    Allergies (verified) Cefazolin, Baclofen, Carbamazepine, Duloxetine, Fentanyl, Gabapentin, Naproxen, Oxcarbazepine, Pregabalin, Serotonin reuptake inhibitors (ssris), Topiramate, Venlafaxine, Zonisamide, Ceclor [cefaclor], Milnacipran, and Ondansetron   History: Past Medical History:  Diagnosis Date   Adenomatous colon polyp 09/23/2008   ALLERGIC RHINITIS 07/01/2008   ANXIETY 07/01/2008   BACK PAIN, THORACIC REGION 03/04/2010   BRONCHITIS, CHRONIC 07/01/2008   CARPAL TUNNEL SYNDROME, HX OF 07/01/2008   CHRONIC OBSTRUCTIVE PULMONARY DISEASE, ACUTE EXACERBATION 08/07/2010   COPD (chronic obstructive pulmonary disease) (HCC)    DEPRESSION 07/01/2008   DVT, HX OF 07/01/2008   ECZEMA 05/29/2009   FACIAL PAIN 12/25/2009   FIBROMYALGIA 07/01/2008   GERD 07/01/2008   Hiatal hernia    Hyperlipidemia    LEG PAIN, BILATERAL 07/01/2008   MIGRAINES, HX OF 07/01/2008   OCCIPITAL NEURALGIA 07/01/2008   OSTEOARTHRITIS 07/01/2008   Other specified trigeminal nerve disorders 07/01/2008   REFLEX SYMPATHETIC DYSTROPHY 07/01/2008   Trigeminal neuralgia 05/21/2009   Unspecified vitamin D deficiency 03/31/2010   Past Surgical History:  Procedure Laterality Date   CARPAL TUNNEL RELEASE  07/27/2007   RIGHT   EYE SURGERY     HEAD CRANIECTOMY  07/26/1992   MICROVASULAR DECOMPRESSION   LAVH     BSO   LEFT KNEE SURGERY  07/27/1987   OCCIPITAL NERVE STIMULATOR INSERTION     RIGHT FOOT BONE SPUR  07/26/1985   RIGHT FOOT SURGERY  07/26/1996   BAD CUT   RIGHT JAW SURGERY  07/26/2000   RIGHT KNEE SURGERY  07/26/1988   RIGHT SOULDER SURGERY  1992, 1993, 1995, 1999   RIGHT THUMB SUGERY  07/26/2002   tear duct surgery     TONSILLECTOMY   07/27/1955   Family History  Problem Relation Age of Onset   Ovarian cancer Mother 39   Hypertension Father    Prostate cancer Father    Heart attack Father    Heart attack Maternal Grandmother    Stroke Paternal Grandmother    Social History   Socioeconomic History   Marital status: Married    Spouse name: Loraine Leriche   Number of children: 0   Years of education: college-3   Highest education level: Some college, no degree  Occupational History   Occupation: disabled  Tobacco Use   Smoking status: Former    Current packs/day: 0.00    Average packs/day: 2.0 packs/day for 30.0 years (60.1 ttl pk-yrs)    Types: Cigarettes    Start date: 68    Quit date:  02/04/2012    Years since quitting: 11.4    Passive exposure: Current   Smokeless tobacco: Never   Tobacco comments:    nicotine in vapor and is very low nicotine   Vaping Use   Vaping status: Every Day  Substance and Sexual Activity   Alcohol use: No    Alcohol/week: 0.0 standard drinks of alcohol    Comment: quit 2000   Drug use: No   Sexual activity: Never  Other Topics Concern   Not on file  Social History Narrative   Patient lives at home with husband Loraine Leriche.    Patient has no children and 2 step children.    Patient is on disability since 73 for arthalgia.    Patient has 3 years of college.    Patient is right handed.    Social Drivers of Corporate investment banker Strain: Low Risk  (07/25/2023)   Overall Financial Resource Strain (CARDIA)    Difficulty of Paying Living Expenses: Not hard at all  Food Insecurity: No Food Insecurity (07/25/2023)   Hunger Vital Sign    Worried About Running Out of Food in the Last Year: Never true    Ran Out of Food in the Last Year: Never true  Transportation Needs: No Transportation Needs (07/25/2023)   PRAPARE - Administrator, Civil Service (Medical): No    Lack of Transportation (Non-Medical): No  Physical Activity: Inactive (07/25/2023)   Exercise Vital  Sign    Days of Exercise per Week: 0 days    Minutes of Exercise per Session: 0 min  Stress: Stress Concern Present (07/25/2023)   [employer] of Occupational Health - Occupational Stress Questionnaire    Feeling of Stress : To some extent  Social Connections: Socially Integrated (07/25/2023)   Social Connection and Isolation Panel [NHANES]    Frequency of Communication with Friends and Family: More than three times a week    Frequency of Social Gatherings with Friends and Family: More than three times a week    Attends Religious Services: More than 4 times per year    Active Member of [employer] or Organizations: Yes    Attends Engineer, structural: More than 4 times per year    Marital Status: Married    Tobacco Counseling Counseling given: Not Answered Tobacco comments: nicotine in vapor and is very low nicotine    Clinical Intake:  Pre-visit preparation completed: Yes  Pain : No/denies pain     BMI - recorded: 44.63 Nutritional Status: BMI > 30  Obese Nutritional Risks: None Diabetes: No  How often do you need to have someone help you when you read instructions, pamphlets, or other written materials from your doctor or pharmacy?: 1 - Never  Interpreter Needed?: No  Information entered by :: Theresa Mulligan LPN   Activities of Daily Living    07/25/2023    3:05 PM  In your present state of health, do you have any difficulty performing the following activities:  Hearing? 0  Vision? 0  Difficulty concentrating or making decisions? 0  Walking or climbing stairs? 1  Comment Uses Walker and [employer] or bathing? 0  Doing errands, shopping? 0  Preparing Food and eating ? N  Using the Toilet? N  In the past six months, have you accidently leaked urine? N  Do you have problems with loss of bowel control? N  Managing your Medications? N  Managing your Finances? N  Housekeeping or managing your Housekeeping?  N    Patient Care Team: Kristian Covey, MD as PCP - General Corky Crafts, MD as PCP - Cardiology (Cardiology) Berneice Gandy, MD as Consulting Physician (Neurology)  Indicate any recent Medical Services you may have received from other than [facility] providers in the past year (date may be approximate).     Assessment:   This is a routine wellness examination for Meenu.  Hearing/Vision screen Hearing Screening - Comments:: Denies hearing difficulties   Vision Screening - Comments:: Wears rx glasses - up to date with routine eye exams with  [facility]   Goals Addressed               This Visit's Progress     Stay Active (pt-stated)        Continue to lose weight.       Depression Screen    07/25/2023    3:04 PM 07/20/2022    3:13 PM 07/16/2021    2:36 PM 07/14/2020    1:48 PM 10/15/2016    4:12 PM 06/14/2013   10:53 AM  PHQ 2/9 Scores  PHQ - 2 Score 0 0 0 0 0 0  PHQ- 9 Score    0      Fall Risk    07/25/2023    3:07 PM 09/06/2022    3:41 PM 07/20/2022    3:24 PM 07/15/2022    8:21 PM 08/27/2021    6:13 PM  Fall Risk   Falls in the past year? 0 0 0 0 0  Number falls in past yr: 0  0 0   Injury with Fall? 0  0 0   Risk for fall due to : No Fall Risks  No Fall Risks    Follow up Falls prevention discussed  Falls prevention discussed      MEDICARE RISK AT HOME: Medicare Risk at Home Any stairs in or around the home?: Yes If so, are there any without handrails?: No Home free of loose throw rugs in walkways, pet beds, electrical cords, etc?: Yes Adequate lighting in your home to reduce risk of falls?: Yes Life alert?: No Use of a cane, walker or w/c?: Yes Grab bars in the bathroom?: Yes Shower chair or bench in shower?: Yes Elevated toilet seat or a handicapped toilet?: No  TIMED UP AND GO:  Was the test performed?  No    Cognitive Function:    10/15/2016    4:30 PM  MMSE - Mini Mental State Exam  Not completed: --        07/25/2023    3:08 PM 07/20/2022    3:25 PM  07/16/2021    2:47 PM  6CIT Screen  What Year? 0 points 0 points 0 points  What month? 0 points 0 points 0 points  What time? 0 points 0 points 0 points  Count back from 20 0 points 0 points 0 points  Months in reverse 0 points 0 points 0 points  Repeat phrase 0 points 0 points 0 points  Total Score 0 points 0 points 0 points    Immunizations Immunization History  Administered Date(s) Administered   Fluad Quad(high Dose 65+) 04/09/2019   Influenza Split 04/30/2011, 05/04/2012   Influenza Whole 06/10/2008, 03/31/2010   Influenza, High Dose Seasonal PF 06/19/2018   Influenza, Seasonal, Injecte, Preservative Fre 09/13/2015   Influenza,inj,Quad PF,6+ Mos 06/14/2013, 03/28/2014   Influenza,inj,quad, With Preservative 06/19/2018   Influenza-Unspecified 07/26/2016, 05/04/2017, 05/26/2020, 05/25/2021, 05/22/2022, 04/26/2023   Moderna Sars-Covid-2  Vaccination 09/27/2019, 11/02/2019, 06/27/2020   Pfizer(Comirnaty)Fall Seasonal Vaccine 12 years and older 05/22/2022   Pneumococcal Conjugate-13 06/19/2018   Pneumococcal Polysaccharide-23 06/10/2008, 07/21/2020   Rsv, Bivalent, Protein Subunit Rsvpref,pf Verdis Frederickson) 05/22/2022   Td 03/31/2010   Zoster Recombinant(Shingrix) 12/18/2019    TDAP status: Due, Education has been provided regarding the importance of this vaccine. Advised may receive this vaccine at local pharmacy or Health Dept. Aware to provide a copy of the vaccination record if obtained from local pharmacy or Health Dept. Verbalized acceptance and understanding.  Flu Vaccine status: Up to date  Pneumococcal vaccine status: Up to date  Covid-19 vaccine status: Declined, Education has been provided regarding the importance of this vaccine but patient still declined. Advised may receive this vaccine at local pharmacy or Health Dept.or vaccine clinic. Aware to provide a copy of the vaccination record if obtained from local pharmacy or Health Dept. Verbalized acceptance and  understanding.  Qualifies for Shingles Vaccine? Yes   Zostavax completed No   Shingrix Completed?: No.    Education has been provided regarding the importance of this vaccine. Patient has been advised to call insurance company to determine out of pocket expense if they have not yet received this vaccine. Advised may also receive vaccine at local pharmacy or Health Dept. Verbalized acceptance and understanding.  Screening Tests Health Maintenance  Topic Date Due   Hepatitis C Screening  Never done   Lung Cancer Screening  Never done   Zoster Vaccines- Shingrix (2 of 2) 02/12/2020   DTaP/Tdap/Td (2 - Tdap) 03/31/2020   COVID-19 Vaccine (7 - 2024-25 season) 03/27/2023   MAMMOGRAM  07/05/2024   Medicare Annual Wellness (AWV)  07/24/2024   Colonoscopy  02/05/2025   Pneumonia Vaccine 67+ Years old  Completed   INFLUENZA VACCINE  Completed   DEXA SCAN  Completed   HPV VACCINES  Aged Out    Health Maintenance  Health Maintenance Due  Topic Date Due   Hepatitis C Screening  Never done   Lung Cancer Screening  Never done   Zoster Vaccines- Shingrix (2 of 2) 02/12/2020   DTaP/Tdap/Td (2 - Tdap) 03/31/2020   COVID-19 Vaccine (7 - 2024-25 season) 03/27/2023    Colorectal cancer screening: Type of screening: Colonoscopy. Completed 02/06/15. Repeat every 10 years  Mammogram status: Completed 07/06/23. Repeat every year  Bone Density status: Completed 01/22/20. Results reflect: Bone density results: OSTEOPOROSIS. Repeat every   years.  Lung Cancer Screening: (Low Dose CT Chest recommended if Age 29-80 years, 20 pack-year currently smoking OR have quit w/in 15years.) does qualify.   Lung Cancer Screening Referral: Deferred  Additional Screening:  Hepatitis C Screening: does qualify; Deferred  Vision Screening: Recommended annual ophthalmology exams for early detection of glaucoma and other disorders of the eye. Is the patient up to date with their annual eye exam?  Yes  Who is the  provider or what is the name of the office in which the patient attends annual eye exams? [facility] If pt is not established with a provider, would they like to be referred to a provider to establish care? No .   Dental Screening: Recommended annual dental exams for proper oral hygiene    Community Resource Referral / Chronic Care Management:  CRR required this visit?  No   CCM required this visit?  No     Plan:     I have personally reviewed and noted the following in the patient's chart:   Medical and social history Use  of alcohol, tobacco or illicit drugs  Current medications and supplements including opioid prescriptions. Patient is not currently taking opioid prescriptions. Functional ability and status Nutritional status Physical activity Advanced directives List of other physicians Hospitalizations, surgeries, and ER visits in previous 12 months Vitals Screenings to include cognitive, depression, and falls Referrals and appointments  In addition, I have reviewed and discussed with patient certain preventive protocols, quality metrics, and best practice recommendations. A written personalized care plan for preventive services as well as general preventive health recommendations were provided to patient.     Tillie Rung, LPN   [id number]/54/[id number]   After Visit Summary: (MyChart) Due to this being a telephonic visit, the after visit summary with patients personalized plan was offered to patient via MyChart   Nurse Notes: None

## 2023-07-26 MED ORDER — MONTELUKAST SODIUM 10 MG PO TABS: 10.0000 mg | ORAL_TABLET | Freq: Every day | ORAL | 3 refills | Status: DC

## 2023-08-02 NOTE — Telephone Encounter (Signed)
 Patient notified of update  and verbalized understanding.

## 2023-08-02 NOTE — Telephone Encounter (Signed)
 Noted.

## 2023-08-25 DIAGNOSIS — G43719 Chronic migraine without aura, intractable, without status migrainosus: Secondary | ICD-10-CM | POA: Diagnosis not present

## 2023-10-12 ENCOUNTER — Ambulatory Visit: Admitting: Family Medicine

## 2023-10-12 ENCOUNTER — Encounter: Payer: Self-pay | Admitting: Family Medicine

## 2023-10-12 VITALS — BP 130/80 | HR 83 | Temp 97.6°F | Wt 238.7 lb

## 2023-10-12 DIAGNOSIS — E669 Obesity, unspecified: Secondary | ICD-10-CM | POA: Diagnosis not present

## 2023-10-12 DIAGNOSIS — R600 Localized edema: Secondary | ICD-10-CM | POA: Diagnosis not present

## 2023-10-12 DIAGNOSIS — Z6841 Body Mass Index (BMI) 40.0 and over, adult: Secondary | ICD-10-CM

## 2023-10-12 DIAGNOSIS — R5382 Chronic fatigue, unspecified: Secondary | ICD-10-CM | POA: Diagnosis not present

## 2023-10-12 DIAGNOSIS — M359 Systemic involvement of connective tissue, unspecified: Secondary | ICD-10-CM

## 2023-10-12 DIAGNOSIS — N1831 Chronic kidney disease, stage 3a: Secondary | ICD-10-CM

## 2023-10-12 DIAGNOSIS — R739 Hyperglycemia, unspecified: Secondary | ICD-10-CM | POA: Diagnosis not present

## 2023-10-12 DIAGNOSIS — R635 Abnormal weight gain: Secondary | ICD-10-CM

## 2023-10-12 MED ORDER — POTASSIUM CHLORIDE ER 10 MEQ PO TBCR: EXTENDED_RELEASE_TABLET | ORAL | 3 refills | Status: AC

## 2023-10-12 MED ORDER — TORSEMIDE 10 MG PO TABS: 10.0000 mg | ORAL_TABLET | Freq: Every day | ORAL | 3 refills | Status: AC

## 2023-10-12 MED ORDER — RABEPRAZOLE SODIUM 20 MG PO TBEC: 20.0000 mg | DELAYED_RELEASE_TABLET | Freq: Every day | ORAL | 3 refills | Status: AC

## 2023-10-12 NOTE — Progress Notes (Unsigned)
 Established Patient Office Visit  Subjective   Patient ID: Ashley Castaneda, female    DOB: 20-Jan-1951  Age: 73 y.o. MRN: [id number]  Chief Complaint  Patient presents with   Medical Management of Chronic Issues    HPI  {History (Optional):23778} Ashley Castaneda is seen for medical follow-up.  Very complex past medical history.  She has remote history of DVT.  History of migraine headaches, COPD, GERD, history of trigeminal neuralgia, fibromyalgia, Sjogren's syndrome, systemic involvement of connective tissue unspecified, hyperlipidemia, history of chronic bilateral lower extremity edema.  Followed by multiple specialist including most recently rheumatologist at [facility].  They had recommended some follow-up labs with CBC, CMP, and sed rate.  She has had significant issues with dry skin.  She tries to limit bathing time.  Use moisturizers intermittently.  Had recent TSH last summer which was normal.  She is having ongoing issues with dry eyes and dry mouth and is found over-the-counter medications have not provided much relief.  She has chronic fatigue.  Has been ruled out for obstructive sleep apnea previously.  She feels like some of her fatigue may be stress related.  Her husband is very debilitated and she spends much of her time caring for him  She needs refills of several medications including Aciphex, torsemide, K-Lor.  Also has been on Xanax chronically for years and requesting refill.  Past Medical History:  Diagnosis Date   Adenomatous colon polyp 09/23/2008   ALLERGIC RHINITIS 07/01/2008   ANXIETY 07/01/2008   BACK PAIN, THORACIC REGION 03/04/2010   BRONCHITIS, CHRONIC 07/01/2008   CARPAL TUNNEL SYNDROME, HX OF 07/01/2008   CHRONIC OBSTRUCTIVE PULMONARY DISEASE, ACUTE EXACERBATION 08/07/2010   COPD (chronic obstructive pulmonary disease) (HCC)    DEPRESSION 07/01/2008   DVT, HX OF 07/01/2008   ECZEMA 05/29/2009   FACIAL PAIN 12/25/2009   FIBROMYALGIA 07/01/2008   GERD 07/01/2008    Hiatal hernia    Hyperlipidemia    LEG PAIN, BILATERAL 07/01/2008   MIGRAINES, HX OF 07/01/2008   OCCIPITAL NEURALGIA 07/01/2008   OSTEOARTHRITIS 07/01/2008   Other specified trigeminal nerve disorders 07/01/2008   REFLEX SYMPATHETIC DYSTROPHY 07/01/2008   Trigeminal neuralgia 05/21/2009   Unspecified vitamin D deficiency 03/31/2010   Past Surgical History:  Procedure Laterality Date   CARPAL TUNNEL RELEASE  07/27/2007   RIGHT   EYE SURGERY     HEAD CRANIECTOMY  07/26/1992   MICROVASULAR DECOMPRESSION   LAVH     BSO   LEFT KNEE SURGERY  07/27/1987   OCCIPITAL NERVE STIMULATOR INSERTION     RIGHT FOOT BONE SPUR  07/26/1985   RIGHT FOOT SURGERY  07/26/1996   BAD CUT   RIGHT JAW SURGERY  07/26/2000   RIGHT KNEE SURGERY  07/26/1988   RIGHT SOULDER SURGERY  1992, 1993, 1995, 1999   RIGHT THUMB SUGERY  07/26/2002   tear duct surgery     TONSILLECTOMY  07/27/1955    reports that she quit smoking about 11 years ago. Her smoking use included cigarettes. She started smoking about 38 years ago. She has a 60.1 pack-year smoking history. She has been exposed to tobacco smoke. She has never used smokeless tobacco. She reports that she does not drink alcohol and does not use drugs. family history includes Heart attack in her father and maternal grandmother; Hypertension in her father; Ovarian cancer (age of onset: 49) in her mother; Prostate cancer in her father; Stroke in her paternal grandmother. Allergies  Allergen Reactions   Cefazolin Anaphylaxis, Swelling and  Other (See Comments)    Ancef - in surgery, swelled up, turned blue, heart problems Turns Blue   Baclofen Other (See Comments)    REACTION: confusion REACTION: confusion REACTION: confusion REACTION: confusion   Carbamazepine Other (See Comments)    REACTION: confusion Other reaction(s): Confusion Confusion,Bad reactions to all seizure medicine   Duloxetine Other (See Comments)   Fentanyl Nausea And Vomiting and Other  (See Comments)    REACTION: nausea and vomiting   Gabapentin Other (See Comments)    REACTION: confusion   Naproxen Other (See Comments)    Elevates blood pressure   Oxcarbazepine Other (See Comments)    REACTION: confusion Other reaction(s): Other REACTION: confusion REACTION: confusion REACTION: confusion    Pregabalin Other (See Comments)    Other reaction(s): Other   Serotonin Reuptake Inhibitors (Ssris) Other (See Comments)   Topiramate Other (See Comments)    Other reaction(s): Unknown   Venlafaxine Other (See Comments)    Other reaction(s): Unknown   Zonisamide Other (See Comments)    Other reaction(s): Other   Ceclor [Cefaclor]     hives   Milnacipran Other (See Comments)   Ondansetron Nausea And Vomiting    Nausea and vomiting after IV administration    Review of Systems  Constitutional:  Positive for malaise/fatigue. Negative for fever.  Eyes:  Negative for blurred vision.  Respiratory:  Negative for shortness of breath.   Cardiovascular:  Negative for chest pain.  Gastrointestinal:  Negative for abdominal pain.  Genitourinary:  Negative for dysuria.  Skin:  Positive for rash.  Neurological:  Negative for dizziness, weakness and headaches.      Objective:     BP 130/80 (BP Location: Left Arm, Patient Position: Sitting, Cuff Size: Large)   Pulse 83   Temp 97.6 F (36.4 C) (Oral)   Wt 238 lb 11.2 oz (108.3 kg)   SpO2 95%   BMI 43.66 kg/m  BP Readings from Last 3 Encounters:  10/12/23 130/80  05/05/23 132/80  01/14/23 120/66   Wt Readings from Last 3 Encounters:  10/12/23 238 lb 11.2 oz (108.3 kg)  07/25/23 244 lb (110.7 kg)  05/05/23 244 lb (110.7 kg)      Physical Exam Vitals reviewed.  Constitutional:      General: She is not in acute distress.    Appearance: She is well-developed.  Eyes:     Pupils: Pupils are equal, round, and reactive to light.  Neck:     Thyroid: No thyromegaly.     Vascular: No JVD.  Cardiovascular:     Rate  and Rhythm: Normal rate and regular rhythm.     Heart sounds:     No gallop.  Pulmonary:     Effort: Pulmonary effort is normal. No respiratory distress.     Breath sounds: Normal breath sounds. No wheezing or rales.  Musculoskeletal:     Cervical back: Neck supple.  Skin:    Comments: Very dry skin with some dryness and flaking on both hands.  Neurological:     Mental Status: She is alert.      No results found for any visits on 10/12/23.  {Labs (Optional):23779}  The ASCVD Risk score (Arnett DK, et al., 2019) failed to calculate for the following reasons:   Cannot find a previous HDL lab   Cannot find a previous total cholesterol lab    Assessment & Plan:   #1 rheumatologic concerns.  Patient followed by rheumatologist at [facility].  Has been diagnosed with Sjogren's syndrome  along with fibromyalgia and systemic involvement of connective tissue unspecified.  They had requested labs including CBC, CMP, sed rate.  These will be drawn today.  She had several questions today regarding management of her dry eyes and dry mouth. She is encouraged to continue regular follow-up with ophthalmologist  #2 obesity/weight gain.  She is actually lost a few pounds.  We have encouraged her to lose considerably more weight and discuss strategies.  Check A1c with labs above.  #3 history of bilateral lower extremity edema.  She does have a history of chronic kidney disease stage III.  Continue avoid nonsteroidals.  Recheck comprehensive metabolic panel.  Refill torsemide and K-Lor  #4 fatigue.  This been chronic.  She specks a lot of this is due to stress from caring for her husband.  She has been studied for sleep apnea and studies been negative previously.  She had thyroid functions here several months ago which were normal.  #5 GERD stable on Aciphex.  Refills given for 1 year  #6 history of chronic anxiety.  She been on low-dose alprazolam for years.  Refill will be provided for 6 months.   No  follow-ups on file.    Evelena Peat, MD

## 2023-10-12 NOTE — Patient Instructions (Signed)
 Consider over the counter moisturizer such as LacHydrin

## 2023-10-13 LAB — COMPREHENSIVE METABOLIC PANEL
ALT: 15 U/L (ref 0–35)
AST: 16 U/L (ref 0–37)
Albumin: 4.3 g/dL (ref 3.5–5.2)
Alkaline Phosphatase: 73 U/L (ref 39–117)
BUN: 13 mg/dL (ref 6–23)
CO2: 27 meq/L (ref 19–32)
Calcium: 9 mg/dL (ref 8.4–10.5)
Chloride: 101 meq/L (ref 96–112)
Creatinine, Ser: 1.17 mg/dL (ref 0.40–1.20)
GFR: 46.85 mL/min — ABNORMAL LOW (ref >60.00)
Glucose, Bld: 83 mg/dL (ref 70–99)
Potassium: 4.8 meq/L (ref 3.5–5.1)
Sodium: 137 meq/L (ref 135–145)
Total Bilirubin: 0.4 mg/dL (ref 0.2–1.2)
Total Protein: 7 g/dL (ref 6.0–8.3)

## 2023-10-13 LAB — SEDIMENTATION RATE: Sed Rate: 37 mm/h — ABNORMAL HIGH (ref 0–30)

## 2023-10-13 LAB — CBC WITH DIFFERENTIAL/PLATELET
Basophils Absolute: 0.1 10*3/uL (ref 0.0–0.1)
Basophils Relative: 1.2 % (ref 0.0–3.0)
Eosinophils Absolute: 0.2 10*3/uL (ref 0.0–0.7)
Eosinophils Relative: 4.2 % (ref 0.0–5.0)
HCT: 35.6 % — ABNORMAL LOW (ref 36.0–46.0)
Hemoglobin: 12 g/dL (ref 12.0–15.0)
Lymphocytes Relative: 19.5 % (ref 12.0–46.0)
Lymphs Abs: 1.1 10*3/uL (ref 0.7–4.0)
MCHC: 33.6 g/dL (ref 30.0–36.0)
MCV: 91.1 fl (ref 78.0–100.0)
Monocytes Absolute: 0.6 10*3/uL (ref 0.1–1.0)
Monocytes Relative: 10 % (ref 3.0–12.0)
Neutro Abs: 3.8 10*3/uL (ref 1.4–7.7)
Neutrophils Relative %: 65.1 % (ref 43.0–77.0)
Platelets: 312 10*3/uL (ref 150.0–400.0)
RBC: 3.91 Mil/uL (ref 3.87–5.11)
RDW: 13.9 % (ref 11.5–15.5)
WBC: 5.9 10*3/uL (ref 4.0–10.5)

## 2023-10-13 LAB — HEMOGLOBIN A1C: Hgb A1c MFr Bld: 5.8 % (ref 4.6–6.5)

## 2023-10-14 MED ORDER — MELOXICAM 15 MG PO TABS: 15.0000 mg | ORAL_TABLET | Freq: Every day | ORAL | 0 refills | Status: DC

## 2023-10-14 NOTE — Addendum Note (Signed)
 Addended by: Christy Sartorius on: 10/14/2023 02:54 PM   Modules accepted: Orders

## 2023-10-26 DIAGNOSIS — H04123 Dry eye syndrome of bilateral lacrimal glands: Secondary | ICD-10-CM | POA: Diagnosis not present

## 2023-10-26 DIAGNOSIS — G514 Facial myokymia: Secondary | ICD-10-CM | POA: Diagnosis not present

## 2023-10-26 DIAGNOSIS — M3501 Sicca syndrome with keratoconjunctivitis: Secondary | ICD-10-CM | POA: Diagnosis not present

## 2023-10-26 DIAGNOSIS — Z961 Presence of intraocular lens: Secondary | ICD-10-CM | POA: Diagnosis not present

## 2023-11-23 DIAGNOSIS — G43719 Chronic migraine without aura, intractable, without status migrainosus: Secondary | ICD-10-CM | POA: Diagnosis not present

## 2023-11-29 DIAGNOSIS — M67912 Unspecified disorder of synovium and tendon, left shoulder: Secondary | ICD-10-CM | POA: Diagnosis not present

## 2023-11-29 DIAGNOSIS — M791 Myalgia, unspecified site: Secondary | ICD-10-CM | POA: Diagnosis not present

## 2023-11-29 DIAGNOSIS — M542 Cervicalgia: Secondary | ICD-10-CM | POA: Diagnosis not present

## 2023-11-29 DIAGNOSIS — M25512 Pain in left shoulder: Secondary | ICD-10-CM | POA: Diagnosis not present

## 2023-12-13 DIAGNOSIS — M25512 Pain in left shoulder: Secondary | ICD-10-CM | POA: Diagnosis not present

## 2023-12-13 DIAGNOSIS — M542 Cervicalgia: Secondary | ICD-10-CM | POA: Diagnosis not present

## 2023-12-13 DIAGNOSIS — M791 Myalgia, unspecified site: Secondary | ICD-10-CM | POA: Diagnosis not present

## 2023-12-13 DIAGNOSIS — M67912 Unspecified disorder of synovium and tendon, left shoulder: Secondary | ICD-10-CM | POA: Diagnosis not present

## 2023-12-29 DIAGNOSIS — G932 Benign intracranial hypertension: Secondary | ICD-10-CM | POA: Diagnosis not present

## 2023-12-29 DIAGNOSIS — G9050 Complex regional pain syndrome I, unspecified: Secondary | ICD-10-CM | POA: Diagnosis not present

## 2023-12-29 DIAGNOSIS — G509 Disorder of trigeminal nerve, unspecified: Secondary | ICD-10-CM | POA: Diagnosis not present

## 2023-12-29 DIAGNOSIS — R52 Pain, unspecified: Secondary | ICD-10-CM | POA: Diagnosis not present

## 2023-12-29 DIAGNOSIS — G43719 Chronic migraine without aura, intractable, without status migrainosus: Secondary | ICD-10-CM | POA: Diagnosis not present

## 2023-12-29 DIAGNOSIS — G43119 Migraine with aura, intractable, without status migrainosus: Secondary | ICD-10-CM | POA: Diagnosis not present

## 2023-12-29 DIAGNOSIS — G8929 Other chronic pain: Secondary | ICD-10-CM | POA: Diagnosis not present

## 2023-12-29 DIAGNOSIS — G4486 Cervicogenic headache: Secondary | ICD-10-CM | POA: Diagnosis not present

## 2023-12-29 DIAGNOSIS — B0229 Other postherpetic nervous system involvement: Secondary | ICD-10-CM | POA: Diagnosis not present

## 2023-12-29 DIAGNOSIS — H04123 Dry eye syndrome of bilateral lacrimal glands: Secondary | ICD-10-CM | POA: Diagnosis not present

## 2023-12-29 DIAGNOSIS — M62838 Other muscle spasm: Secondary | ICD-10-CM | POA: Diagnosis not present

## 2023-12-29 DIAGNOSIS — G4701 Insomnia due to medical condition: Secondary | ICD-10-CM | POA: Diagnosis not present

## 2024-01-13 DIAGNOSIS — M5412 Radiculopathy, cervical region: Secondary | ICD-10-CM | POA: Diagnosis not present

## 2024-01-20 DIAGNOSIS — M542 Cervicalgia: Secondary | ICD-10-CM | POA: Diagnosis not present

## 2024-01-24 ENCOUNTER — Encounter: Payer: Self-pay | Admitting: Family Medicine

## 2024-01-24 DIAGNOSIS — R200 Anesthesia of skin: Secondary | ICD-10-CM

## 2024-01-25 MED ORDER — ALBUTEROL SULFATE HFA 108 (90 BASE) MCG/ACT IN AERS: 1.0000 | INHALATION_SPRAY | Freq: Four times a day (QID) | RESPIRATORY_TRACT | 1 refills | Status: AC | PRN

## 2024-01-25 MED ORDER — MELOXICAM 15 MG PO TABS: 15.0000 mg | ORAL_TABLET | Freq: Every day | ORAL | 0 refills | Status: DC

## 2024-01-30 MED ORDER — ALPRAZOLAM 0.5 MG PO TABS: 0.5000 mg | ORAL_TABLET | Freq: Two times a day (BID) | ORAL | 1 refills | Status: DC

## 2024-01-31 DIAGNOSIS — M5412 Radiculopathy, cervical region: Secondary | ICD-10-CM | POA: Diagnosis not present

## 2024-01-31 DIAGNOSIS — M47812 Spondylosis without myelopathy or radiculopathy, cervical region: Secondary | ICD-10-CM | POA: Diagnosis not present

## 2024-02-22 DIAGNOSIS — G43719 Chronic migraine without aura, intractable, without status migrainosus: Secondary | ICD-10-CM | POA: Diagnosis not present

## 2024-03-30 MED ORDER — MELOXICAM 15 MG PO TABS: 15.0000 mg | ORAL_TABLET | Freq: Every day | ORAL | 0 refills | Status: DC

## 2024-03-30 NOTE — Addendum Note (Signed)
 Addended by: METTA KRISTEN CROME on: 03/30/2024 01:56 PM   Modules accepted: Orders

## 2024-04-02 NOTE — Addendum Note (Signed)
 Addended by: METTA KRISTEN CROME on: 04/02/2024 08:07 AM   Modules accepted: Orders

## 2024-04-11 DIAGNOSIS — Z961 Presence of intraocular lens: Secondary | ICD-10-CM | POA: Diagnosis not present

## 2024-04-11 DIAGNOSIS — H04123 Dry eye syndrome of bilateral lacrimal glands: Secondary | ICD-10-CM | POA: Diagnosis not present

## 2024-04-11 DIAGNOSIS — M3501 Sicca syndrome with keratoconjunctivitis: Secondary | ICD-10-CM | POA: Diagnosis not present

## 2024-04-11 DIAGNOSIS — G514 Facial myokymia: Secondary | ICD-10-CM | POA: Diagnosis not present

## 2024-04-19 ENCOUNTER — Telehealth: Admitting: Physician Assistant

## 2024-04-19 ENCOUNTER — Ambulatory Visit: Payer: Self-pay

## 2024-04-19 DIAGNOSIS — T3695XA Adverse effect of unspecified systemic antibiotic, initial encounter: Secondary | ICD-10-CM | POA: Diagnosis not present

## 2024-04-19 DIAGNOSIS — L03039 Cellulitis of unspecified toe: Secondary | ICD-10-CM | POA: Diagnosis not present

## 2024-04-19 DIAGNOSIS — B379 Candidiasis, unspecified: Secondary | ICD-10-CM

## 2024-04-19 MED ORDER — DOXYCYCLINE HYCLATE 100 MG PO TABS
100.0000 mg | ORAL_TABLET | Freq: Two times a day (BID) | ORAL | 0 refills | Status: AC
Start: 2024-04-19 — End: ?

## 2024-04-19 MED ORDER — FLUCONAZOLE 150 MG PO TABS: 150.0000 mg | ORAL_TABLET | ORAL | 0 refills | Status: AC | PRN

## 2024-04-19 NOTE — Progress Notes (Signed)
 Virtual Visit Consent   Ashley Castaneda, you are scheduled for a virtual visit with a [facility] provider today. Just as with appointments in the office, your consent must be obtained to participate. Your consent will be active for this visit and any virtual visit you may have with one of our providers in the next 365 days. If you have a MyChart account, a copy of this consent can be sent to you electronically.  As this is a virtual visit, video technology does not allow for your provider to perform a traditional examination. This may limit your provider's ability to fully assess your condition. If your provider identifies any concerns that need to be evaluated in person or the need to arrange testing (such as labs, EKG, etc.), we will make arrangements to do so. Although advances in technology are sophisticated, we cannot ensure that it will always work on either your end or our end. If the connection with a video visit is poor, the visit may have to be switched to a telephone visit. With either a video or telephone visit, we are not always able to ensure that we have a secure connection.  By engaging in this virtual visit, you consent to the provision of healthcare and authorize for your insurance to be billed (if applicable) for the services provided during this visit. Depending on your insurance coverage, you may receive a charge related to this service.  I need to obtain your verbal consent now. Are you willing to proceed with your visit today? RAYANNA MATUSIK has provided verbal consent on 04/19/2024 for a virtual visit (video or telephone). [name], PA-C  Date: 04/19/2024 11:22 AM   Virtual Visit via Video Note   I, [name], connected with  SHANELE NISSAN  ([id number], [birth year]) on 04/19/24 at 10:30 AM EDT by a video-enabled telemedicine application and verified that I am speaking with the correct person using two identifiers.  Location: Patient: Virtual Visit Location  Patient: Home Provider: Virtual Visit Location Provider: Home Office   I discussed the limitations of evaluation and management by telemedicine and the availability of in person appointments. The patient expressed understanding and agreed to proceed.    History of Present Illness: Ashley Castaneda is a 73 y.o. who identifies as a female who was assigned female at birth, and is being seen today for toe injury to left great toe.  HPI: Toe Pain  The incident occurred more than 1 week ago (Sept 9, 2025). The incident occurred at home. The injury mechanism was a compression. The pain is present in the left toes (Left great toe). The quality of the pain is described as aching and stabbing. The pain is moderate. The pain has been Constant since onset. Associated symptoms include numbness (has chronic idiopathic neuropathy). Pertinent negatives include no inability to bear weight, loss of motion, loss of sensation or muscle weakness. She reports no foreign bodies present. The symptoms are aggravated by weight bearing (being in a closed toe shoe). She has tried elevation and acetaminophen  (epsom salt soaks, neosporin, bandaid covering) for the symptoms. The treatment provided mild relief.   Rolled a chair over her great toe and second toe on 04/03/2024; then noticed that her great toe nail was black and had blood under the nail that would come out when she walked. She pressed the nail that night until all blood was gone and nail was clear; no cut or open wound, but nail was lifted from bed  slightly. She washed with NeilMed wound cleanser and put antibiotic ointment on and covered with band-aid. Bleeding subsided but then began to have a serous discharge, minimal, over the next week. Drainage then progressed to a more purulent drainage over the last day or so and pain started to increase. She denies any fevers, chills, body aches, nausea or vomiting.    Problems:  Patient Active Problem List   Diagnosis Date  Noted   Chronic rhinitis 10/13/2021   Chronic obstructive pulmonary disease (HCC) 10/13/2021   Encounter for Medicare annual wellness exam 07/07/2020   Status post right cataract extraction 04/10/2020   DOE (dyspnea on exertion) 10/17/2019   Recurrent aphthous ulcer 03/27/2019   Chronic bilateral low back pain without sciatica 02/09/2019   Pain in surgical scar 08/01/2018   Bilateral leg edema 09/28/2017   Infected surgical wound 09/14/2017   Status post insertion of spinal cord stimulator 09/14/2017   Obesity 10/08/2015   Migraine headache 01/18/2014   Dizziness and giddiness 01/18/2014   Numbness and tingling of both legs 01/18/2014   Severe obesity (BMI >= 40) (HCC) 06/14/2013   Numbness and tingling in right hand 05/30/2013   Hyperlipidemia 06/06/2012   Fatigue 06/06/2012   Dvt femoral (deep venous thrombosis) (HCC) 10/28/2011   CHRONIC OBSTRUCTIVE PULMONARY DISEASE, ACUTE EXACERBATION 08/07/2010   UNSPECIFIED VITAMIN D  DEFICIENCY 03/31/2010   BACK PAIN, THORACIC REGION 03/04/2010   ECZEMA 05/29/2009   TRIGEMINAL NEURALGIA 05/21/2009   Anxiety state 07/01/2008   DEPRESSION 07/01/2008   REFLEX SYMPATHETIC DYSTROPHY 07/01/2008   OTHER SPECIFIED TRIGEMINAL NERVE DISORDERS 07/01/2008   Allergic rhinitis 07/01/2008   BRONCHITIS, CHRONIC 07/01/2008   GERD 07/01/2008   OSTEOARTHRITIS 07/01/2008   OCCIPITAL NEURALGIA 07/01/2008   FIBROMYALGIA 07/01/2008   CARPAL TUNNEL SYNDROME, HX OF 07/01/2008   MIGRAINES, HX OF 07/01/2008    Allergies:  Allergies  Allergen Reactions   Cefazolin Anaphylaxis, Swelling and Other (See Comments)    Ancef - in surgery, swelled up, turned blue, heart problems Turns Blue   Baclofen Other (See Comments)    REACTION: confusion REACTION: confusion REACTION: confusion REACTION: confusion   Carbamazepine Other (See Comments)    REACTION: confusion Other reaction(s): Confusion Confusion,Bad reactions to all seizure medicine   Duloxetine Other  (See Comments)   Fentanyl  Nausea And Vomiting and Other (See Comments)    REACTION: nausea and vomiting   Gabapentin Other (See Comments)    REACTION: confusion   Naproxen Other (See Comments)    Elevates blood pressure   Oxcarbazepine Other (See Comments)    REACTION: confusion Other reaction(s): Other REACTION: confusion REACTION: confusion REACTION: confusion    Pregabalin Other (See Comments)    Other reaction(s): Other   Serotonin Reuptake Inhibitors (Ssris) Other (See Comments)   Topiramate Other (See Comments)    Other reaction(s): Unknown   Venlafaxine Other (See Comments)    Other reaction(s): Unknown   Zonisamide Other (See Comments)    Other reaction(s): Other   Ceclor [Cefaclor]     hives   Milnacipran Other (See Comments)   Ondansetron  Nausea And Vomiting    Nausea and vomiting after IV administration   Medications:  Current Outpatient Medications:    doxycycline  (VIBRA -TABS) 100 MG tablet, Take 1 tablet (100 mg total) by mouth 2 (two) times daily., Disp: 14 tablet, Rfl: 0   fluconazole  (DIFLUCAN ) 150 MG tablet, Take 1 tablet (150 mg total) by mouth every 3 (three) days as needed., Disp: 2 tablet, Rfl: 0   albuterol  (PROAIR  HFA)  108 (90 Base) MCG/ACT inhaler, Inhale 1-2 puffs into the lungs every 6 (six) hours as needed for wheezing or shortness of breath., Disp: 8 g, Rfl: 1   ALPRAZolam  (XANAX ) 0.5 MG tablet, Take 1 tablet (0.5 mg total) by mouth 2 (two) times daily., Disp: 180 tablet, Rfl: 1   aspirin 325 MG tablet, daily., Disp: , Rfl:    benzonatate  (TESSALON ) 100 MG capsule, Take 1 capsule (100 mg total) by mouth 2 (two) times daily as needed for cough., Disp: 20 capsule, Rfl: 0   calcium  carbonate (OS-CAL - DOSED IN MG OF ELEMENTAL CALCIUM ) 1250 MG tablet, Take 1 tablet by mouth daily., Disp: , Rfl:    Calcium  Citrate-Vitamin D  (CAL-CITRATE PLUS VITAMIN D ) 250-2.5 MG-MCG TABS, Take 2 tablets by mouth 2 (two) times daily., Disp: 120 tablet, Rfl: 3    carboxymethylcellulose (REFRESH PLUS) 0.5 % SOLN, Take 1 drop by mouth daily as needed. For dry eyes, Disp: , Rfl:    cycloSPORINE (RESTASIS) 0.05 % ophthalmic emulsion, Place 1 drop into both eyes 2 (two) times daily., Disp: , Rfl:    cycloSPORINE (RESTASIS) 0.05 % ophthalmic emulsion, Place 1 drop into both eyes every 12 (twelve) hours., Disp: , Rfl:    Eptinezumab -jjmr (VYEPTI  IV), Inject into the vein., Disp: , Rfl:    fluticasone  (FLONASE ) 50 MCG/ACT nasal spray, Place 2 sprays into both nostrils daily as needed. For nasal congestion, Disp: 16 g, Rfl: 3   fluticasone  furoate-vilanterol (BREO ELLIPTA ) 200-25 MCG/ACT AEPB, Inhale 1 puff into the lungs daily., Disp: 1 each, Rfl: 11   frovatriptan (FROVA) 2.5 MG tablet, Take 2.5 mg by mouth as needed. If recurs, may repeat after 2 hours. Max of 3 tabs in 24 hours. For migraines., Disp: , Rfl:    lidocaine  (LIDODERM ) 5 %, Place 1 patch onto the skin daily. Remove & Discard patch within 12 hours or as directed by MD, Disp: 30 patch, Rfl: 1   meclizine  (ANTIVERT ) 25 MG tablet, TAKE 1 TABLET BY MOUTH 4 TIMES A DAY AS NEEDED FOR DIZZINESS, Disp: 120 tablet, Rfl: 1   meloxicam  (MOBIC ) 15 MG tablet, Take 1 tablet (15 mg total) by mouth daily., Disp: 30 tablet, Rfl: 0   metaxalone (SKELAXIN) 800 MG tablet, Take 800 mg by mouth 3 (three) times daily., Disp: , Rfl:    montelukast  (SINGULAIR ) 10 MG tablet, Take 1 tablet (10 mg total) by mouth at bedtime., Disp: 90 tablet, Rfl: 3   Multiple Vitamin (MULTIVITAMIN) tablet, Take 1 tablet by mouth every morning., Disp: , Rfl:    Omega-3 Fatty Acids (FISH OIL) 1200 MG CAPS, Take 1 capsule by mouth every evening., Disp: , Rfl:    potassium chloride  (KLOR-CON ) 10 MEQ tablet, Take two tablets once daily, Disp: 180 tablet, Rfl: 3   promethazine  (PHENERGAN ) 25 MG tablet, Take 1 tablet (25 mg total) by mouth every 8 (eight) hours as needed for nausea or vomiting., Disp: 20 tablet, Rfl: 0   RABEprazole  (ACIPHEX ) 20 MG  tablet, Take 1 tablet (20 mg total) by mouth daily., Disp: 90 tablet, Rfl: 3   Rimegepant Sulfate (NURTEC) 75 MG TBDP, Take by mouth., Disp: , Rfl:    tiZANidine (ZANAFLEX) 2 MG tablet, Take 2 mg by mouth at bedtime., Disp: , Rfl:    torsemide  (DEMADEX ) 10 MG tablet, Take 1 tablet (10 mg total) by mouth daily., Disp: 90 tablet, Rfl: 3   vitamin E 400 UNIT capsule, Take 400 Units by mouth daily at 6 PM., Disp: ,  Rfl:   Observations/Objective: Patient is well-developed, well-nourished in no acute distress.  Resting comfortably at home.  Head is normocephalic, atraumatic.  No labored breathing.  Speech is clear and coherent with logical content.  Patient is alert and oriented at baseline.  Left great toe is slightly swollen and erythematous on the medial border. There appears to be a cloudiness under the nail compared to the right but both nails are yellow in coloration.  Assessment and Plan: 1. Paronychia of great toe (Primary) - doxycycline  (VIBRA -TABS) 100 MG tablet; Take 1 tablet (100 mg total) by mouth 2 (two) times daily.  Dispense: 14 tablet; Refill: 0  2. Antibiotic-induced yeast infection - fluconazole  (DIFLUCAN ) 150 MG tablet; Take 1 tablet (150 mg total) by mouth every 3 (three) days as needed.  Dispense: 2 tablet; Refill: 0  - Suspected under the nail infection from where toe nail was pulled off nail bed - Add Doxycycline  as above - Continue Epsom salt soaks - Do not use neosporin any longer - Can cover with dry bandage if needed - Strict precautions to be seen in person if develops nausea, vomiting, fever, chills, or if pain worsens or purulent discharge continues  Follow Up Instructions: I discussed the assessment and treatment plan with the patient. The patient was provided an opportunity to ask questions and all were answered. The patient agreed with the plan and demonstrated an understanding of the instructions.  A copy of instructions were sent to the patient via MyChart  unless otherwise noted below.    The patient was advised to call back or seek an in-person evaluation if the symptoms worsen or if the condition fails to improve as anticipated.    [name], PA-C

## 2024-04-19 NOTE — Patient Instructions (Signed)
 Ashley Castaneda, thank you for joining Ashley Castaneda Dickinson, PA-C for today's virtual visit.  While this provider is not your primary care provider (PCP), if your PCP is located in our provider database this encounter information will be shared with them immediately following your visit.   A [facility] MyChart account gives you access to today's visit and all your visits, tests, and labs performed at [facility]  click here if you don't have a [facility] MyChart account or go to [email or web address]  Consent: (Patient) Ashley Castaneda provided verbal consent for this virtual visit at the beginning of the encounter.  Current Medications:  Current Outpatient Medications:    doxycycline  (VIBRA -TABS) 100 MG tablet, Take 1 tablet (100 mg total) by mouth 2 (two) times daily., Disp: 14 tablet, Rfl: 0   fluconazole  (DIFLUCAN ) 150 MG tablet, Take 1 tablet (150 mg total) by mouth every 3 (three) days as needed., Disp: 2 tablet, Rfl: 0   albuterol  (PROAIR  HFA) 108 (90 Base) MCG/ACT inhaler, Inhale 1-2 puffs into the lungs every 6 (six) hours as needed for wheezing or shortness of breath., Disp: 8 g, Rfl: 1   ALPRAZolam  (XANAX ) 0.5 MG tablet, Take 1 tablet (0.5 mg total) by mouth 2 (two) times daily., Disp: 180 tablet, Rfl: 1   aspirin 325 MG tablet, daily., Disp: , Rfl:    benzonatate  (TESSALON ) 100 MG capsule, Take 1 capsule (100 mg total) by mouth 2 (two) times daily as needed for cough., Disp: 20 capsule, Rfl: 0   calcium  carbonate (OS-CAL - DOSED IN MG OF ELEMENTAL CALCIUM ) 1250 MG tablet, Take 1 tablet by mouth daily., Disp: , Rfl:    Calcium  Citrate-Vitamin D  (CAL-CITRATE PLUS VITAMIN D ) 250-2.5 MG-MCG TABS, Take 2 tablets by mouth 2 (two) times daily., Disp: 120 tablet, Rfl: 3   carboxymethylcellulose (REFRESH PLUS) 0.5 % SOLN, Take 1 drop by mouth daily as needed. For dry eyes, Disp: , Rfl:    cycloSPORINE (RESTASIS) 0.05 % ophthalmic emulsion, Place 1 drop into both eyes 2 (two)  times daily., Disp: , Rfl:    cycloSPORINE (RESTASIS) 0.05 % ophthalmic emulsion, Place 1 drop into both eyes every 12 (twelve) hours., Disp: , Rfl:    Eptinezumab -jjmr (VYEPTI  IV), Inject into the vein., Disp: , Rfl:    fluticasone  (FLONASE ) 50 MCG/ACT nasal spray, Place 2 sprays into both nostrils daily as needed. For nasal congestion, Disp: 16 g, Rfl: 3   fluticasone  furoate-vilanterol (BREO ELLIPTA ) 200-25 MCG/ACT AEPB, Inhale 1 puff into the lungs daily., Disp: 1 each, Rfl: 11   frovatriptan (FROVA) 2.5 MG tablet, Take 2.5 mg by mouth as needed. If recurs, may repeat after 2 hours. Max of 3 tabs in 24 hours. For migraines., Disp: , Rfl:    lidocaine  (LIDODERM ) 5 %, Place 1 patch onto the skin daily. Remove & Discard patch within 12 hours or as directed by MD, Disp: 30 patch, Rfl: 1   meclizine  (ANTIVERT ) 25 MG tablet, TAKE 1 TABLET BY MOUTH 4 TIMES A DAY AS NEEDED FOR DIZZINESS, Disp: 120 tablet, Rfl: 1   meloxicam  (MOBIC ) 15 MG tablet, Take 1 tablet (15 mg total) by mouth daily., Disp: 30 tablet, Rfl: 0   metaxalone (SKELAXIN) 800 MG tablet, Take 800 mg by mouth 3 (three) times daily., Disp: , Rfl:    montelukast  (SINGULAIR ) 10 MG tablet, Take 1 tablet (10 mg total) by mouth at bedtime., Disp: 90 tablet, Rfl: 3   Multiple Vitamin (MULTIVITAMIN) tablet, Take 1 tablet by  mouth every morning., Disp: , Rfl:    Omega-3 Fatty Acids (FISH OIL) 1200 MG CAPS, Take 1 capsule by mouth every evening., Disp: , Rfl:    potassium chloride  (KLOR-CON ) 10 MEQ tablet, Take two tablets once daily, Disp: 180 tablet, Rfl: 3   promethazine  (PHENERGAN ) 25 MG tablet, Take 1 tablet (25 mg total) by mouth every 8 (eight) hours as needed for nausea or vomiting., Disp: 20 tablet, Rfl: 0   RABEprazole  (ACIPHEX ) 20 MG tablet, Take 1 tablet (20 mg total) by mouth daily., Disp: 90 tablet, Rfl: 3   Rimegepant Sulfate (NURTEC) 75 MG TBDP, Take by mouth., Disp: , Rfl:    tiZANidine (ZANAFLEX) 2 MG tablet, Take 2 mg by mouth at  bedtime., Disp: , Rfl:    torsemide  (DEMADEX ) 10 MG tablet, Take 1 tablet (10 mg total) by mouth daily., Disp: 90 tablet, Rfl: 3   vitamin E 400 UNIT capsule, Take 400 Units by mouth daily at 6 PM., Disp: , Rfl:    Medications ordered in this encounter:  Meds ordered this encounter  Medications   doxycycline  (VIBRA -TABS) 100 MG tablet    Sig: Take 1 tablet (100 mg total) by mouth 2 (two) times daily.    Dispense:  14 tablet    Refill:  0    Supervising Provider:   LAMPTEY, PHILIP O [[id number]]   fluconazole  (DIFLUCAN ) 150 MG tablet    Sig: Take 1 tablet (150 mg total) by mouth every 3 (three) days as needed.    Dispense:  2 tablet    Refill:  0    Supervising Provider:   LAMPTEY, PHILIP O [[id number]]     *If you need refills on other medications prior to your next appointment, please contact your pharmacy*  Follow-Up: Call back or seek an in-person evaluation if the symptoms worsen or if the condition fails to improve as anticipated.  [name] [phone]  Other Instructions Paronychia Paronychia is an infection of the skin that surrounds a nail. It usually affects the skin around a fingernail, but it may also occur near a toenail. It often causes pain and swelling around the nail. In some cases, a collection of pus (abscess) can form near or under the nail.  This condition may develop suddenly, or it may develop gradually over a longer period. In most cases, paronychia is not serious, and it will clear up with treatment. What are the causes? This condition may be caused by bacteria or a fungus, such as yeast. The bacteria or fungus can enter the body through an opening in the skin, such as a cut or a hangnail, and cause an infection in your fingernail or toenail. Other causes may include: Recurrent injury to the fingernail or toenail area. Irritation of the base and sides of the nail (cuticle). Injury and irritation can result in inflammation, swelling, and thickened  skin around the nail. What increases the risk? This condition is more likely to develop in people who: Get their hands wet often, such as those who work as Fish farm manager, bartenders, or housekeepers. Bite their fingernails or cuticles. Have underlying skin conditions. Have hangnails or injured fingertips. Are exposed to irritants like detergents and other chemicals. Have diabetes. What are the signs or symptoms? Symptoms of this condition include: Redness and swelling of the skin near the nail. Tenderness around the nail when you touch the area. Pus-filled bumps under the cuticle. Fluid or pus under the nail. Throbbing pain in the area. How is  this diagnosed? This condition is diagnosed with a physical exam. In some cases, a sample of pus may be tested to determine what type of bacteria or fungus is causing the condition. How is this treated? Treatment depends on the cause and severity of your condition. If your condition is mild, it may clear up on its own in a few days or after soaking in warm water. If needed, treatment may include: Antibiotic medicine, if your infection is caused by bacteria. Antifungal medicine, if your infection is caused by a fungus. A procedure to drain pus from an abscess. Anti-inflammatory medicine (corticosteroids). Removal of part of an ingrown toenail. A bandage (dressing) may be placed over the affected area if an abscess or part of a nail has been removed. Follow these instructions at home: Wound care Keep the affected area clean. Soak the affected area in warm water if told to do so by your health care provider. You may be told to do this for 20 minutes, 2-3 times a day. Keep the area dry when you are not soaking it. Do not try to drain an abscess yourself. Follow instructions from your health care provider about how to take care of the affected area. Make sure you: Wash your hands with soap and water for at least 20 seconds before and after you change  your dressing. If soap and water are not available, use hand sanitizer. Change your dressing as told by your health care provider. If you had an abscess drained, check the area every day for signs of infection. Check for: Redness, swelling, or pain. Fluid or blood. Warmth. Pus or a bad smell. Medicines  Take over-the-counter and prescription medicines only as told by your health care provider. If you were prescribed an antibiotic medicine, take it as told by your health care provider. Do not stop taking the antibiotic even if you start to feel better. General instructions Avoid contact with any skin irritants or allergens. Do not pick at the affected area. Keep all follow-up visits as told. This is important. Prevention To prevent this condition from happening again: Wear rubber gloves when washing dishes or doing other tasks that require your hands to get wet. Wear gloves if your hands might come in contact with cleaners or other chemicals. Avoid injuring your nails or fingertips. Do not bite your nails or tear hangnails. Do not cut your nails very short. Do not cut your cuticles. Use clean nail clippers or scissors when trimming nails. Contact a health care provider if: Your symptoms get worse or do not improve with treatment. You have continued or increased fluid, blood, or pus coming from the affected area. Your affected finger, toe, or joint becomes swollen or difficult to move. You have a fever or chills. There is redness spreading away from the affected area. Summary Paronychia is an infection of the skin that surrounds a nail. It often causes pain and swelling around the nail. In some cases, a collection of pus (abscess) can form near or under the nail. This condition may be caused by bacteria or a fungus. These germs can enter the body through an opening in the skin, such as a cut or a hangnail. If your condition is mild, it may clear up on its own in a few days. If needed,  treatment may include medicine or a procedure to drain pus from an abscess. To prevent this condition from happening again, wear gloves if doing tasks that require your hands to get wet or to  come in contact with chemicals. Also avoid injuring your nails or fingertips. This information is not intended to replace advice given to you by your health care provider. Make sure you discuss any questions you have with your health care provider. Document Revised: 10/13/2020 Document Reviewed: 10/13/2020 Elsevier Patient Education  2024 Elsevier Inc.   If you have been instructed to have an in-person evaluation today at a local Urgent Care facility, please use the link below. It will take you to a list of all of our available [facility], including address, phone number and hours of operation. Please do not delay care.  [facility]  If you or a family member do not have a primary care provider, use the link below to schedule a visit and establish care. When you choose a [facility] primary care physician or advanced practice provider, you gain a long-term partner in health. Find a Primary Care Provider  Learn more about [facility]'s in-office and virtual care options: [employer] - Get Care Now

## 2024-04-19 NOTE — Telephone Encounter (Signed)
 FYI Only or Action Required?: FYI only for provider. Patient is planning on going to urgent care due to availability today. Patient states she may call back and make an appointment tomorrow in the office if urgent care ends up not working for her.   Patient was last seen in primary care on 10/12/2023 by Micheal Wolm ORN, MD.  Called Nurse Triage reporting Toe Pain.  Symptoms began several weeks ago.  Interventions attempted: Rest, hydration, or home remedies.  Symptoms are: unchanged.  Triage Disposition: See HCP Within 4 Hours (Or PCP Triage)  Patient/caregiver understands and will follow disposition?: Yes  Copied from CRM #[id number]. Topic: Clinical - Red Word Triage >> Apr 19, 2024  8:26 AM Pinkey ORN wrote: Red Word that prompted transfer to Nurse Triage: Redness + Discharge >> Apr 19, 2024  8:28 AM Pinkey ORN wrote: Patient states she hit her left foot / big toe about 10 days ago. Patient states it's still red in color as well as discharging.  Reason for Disposition  [1] Redness spreading into foot or red streak into foot AND [2] no fever  Answer Assessment - Initial Assessment Questions 1. ONSET: When did the pain start?      Started about 2 weeks ago. Patient states she ran her toe over with her office chair 2. LOCATION: Where is the pain located?   (e.g., around nail, entire toe, at foot joint)      Left big toe-entire toe 3. PAIN: How bad is the pain?    (Scale 1-10; or mild, moderate, severe)     8 out of 10 with having to wear shoes and walking.  4. APPEARANCE: What does the toe look like? (e.g., redness, swelling, bruising, pallor)     Redness and drainage 5. CAUSE: What do you think is causing the toe pain?     Patient hurt her toe after smashed her toe with her office chair 6. OTHER SYMPTOMS: Do you have any other symptoms? (e.g., leg pain, rash, fever, numbness)     Drainage of blood from toe  Protocols used: Toe Pain-A-AH

## 2024-04-27 DIAGNOSIS — M79672 Pain in left foot: Secondary | ICD-10-CM | POA: Diagnosis not present

## 2024-05-04 DIAGNOSIS — Z23 Encounter for immunization: Secondary | ICD-10-CM | POA: Diagnosis not present

## 2024-05-07 ENCOUNTER — Ambulatory Visit: Admitting: Podiatry

## 2024-05-07 VITALS — Ht 63.0 in | Wt 230.0 lb

## 2024-05-07 DIAGNOSIS — S99929A Unspecified injury of unspecified foot, initial encounter: Secondary | ICD-10-CM | POA: Diagnosis not present

## 2024-05-07 DIAGNOSIS — S90212A Contusion of left great toe with damage to nail, initial encounter: Secondary | ICD-10-CM | POA: Diagnosis not present

## 2024-05-07 NOTE — Patient Instructions (Signed)

## 2024-05-07 NOTE — Progress Notes (Signed)
  Subjective:  Patient ID: Ashley Castaneda, female    DOB: 05-04-1951,   MRN: [id number]  Chief Complaint  Patient presents with   Nail Problem    NP rolled chair over L great toe nail Sept 9th, 2025.  Lifted nail almost off. Washed, cleaned, soaked in epson salt. Not diabetic. 375mg  Asprin     73 y.o. female presents for concern as above. Relates it has been sore.  . Denies any other pedal complaints. Denies n/v/f/c.   Past Medical History:  Diagnosis Date   Adenomatous colon polyp 09/23/2008   ALLERGIC RHINITIS 07/01/2008   ANXIETY 07/01/2008   BACK PAIN, THORACIC REGION 03/04/2010   BRONCHITIS, CHRONIC 07/01/2008   CARPAL TUNNEL SYNDROME, HX OF 07/01/2008   CHRONIC OBSTRUCTIVE PULMONARY DISEASE, ACUTE EXACERBATION 08/07/2010   COPD (chronic obstructive pulmonary disease) (HCC)    DEPRESSION 07/01/2008   DVT, HX OF 07/01/2008   ECZEMA 05/29/2009   FACIAL PAIN 12/25/2009   FIBROMYALGIA 07/01/2008   GERD 07/01/2008   Hiatal hernia    Hyperlipidemia    LEG PAIN, BILATERAL 07/01/2008   MIGRAINES, HX OF 07/01/2008   OCCIPITAL NEURALGIA 07/01/2008   OSTEOARTHRITIS 07/01/2008   Other specified trigeminal nerve disorders 07/01/2008   REFLEX SYMPATHETIC DYSTROPHY 07/01/2008   Trigeminal neuralgia 05/21/2009   Unspecified vitamin D  deficiency 03/31/2010    Objective:  Physical Exam: Vascular: DP/PT pulses 2/4 bilateral. CFT <3 seconds. Normal hair growth on digits. No edema.  Skin. No lacerations or abrasions bilateral feet. Left hallux nail mostly lifted from the nail bed once small area still attached. Underlying nail bed granular and appears healthy. No signs of infection noted.   Musculoskeletal: MMT 5/5 bilateral lower extremities in DF, PF, Inversion and Eversion. Deceased ROM in DF of ankle joint.  Neurological: Sensation intact to light touch.   Assessment:   1. Injury of great toenail      Plan:  Patient was evaluated and treated and all questions  answered. Discussed ingrown toenails etiology and treatment options including procedure for removal vs conservative care.  Patient requesting removal of ingrown nail today. Procedure below.  Discussed procedure and post procedure care and patient expressed understanding.  Will follow-up in 2 weeks for nail check or sooner if any problems arise.    Procedure:  Procedure: total Nail Avulsion of left hallux nail Surgeon: Asberry Failing, DPM  Pre-op Dx: Ingrown toenail without infection Post-op: Same  Place of Surgery: Office exam room.  Indications for surgery: Painful and ingrown toenail.    The patient is requesting removal of nail without  chemical matrixectomy. Risks and complications were discussed with the patient for which they understand and written consent was obtained. Under sterile, the skin was prepped in sterile fashion. Next the entire hallux nail was removed and area copiously irrigated. Tripe antibiotic was applied. A dry sterile dressing was applied. After application of the dressing the tourniquet was removed and there is found to be an immediate capillary refill time to the digit. The patient tolerated the procedure well without any complications. Post procedure instructions were discussed the patient for which he verbally understood. Follow-up in two weeks for nail check or sooner if any problems are to arise. Discussed signs/symptoms of infection and directed to call the office immediately should any occur or go directly to the emergency room. In the meantime, encouraged to call the office with any questions, concerns, changes symptoms.    Asberry Failing, DPM

## 2024-05-24 DIAGNOSIS — G43719 Chronic migraine without aura, intractable, without status migrainosus: Secondary | ICD-10-CM | POA: Diagnosis not present

## 2024-07-16 ENCOUNTER — Ambulatory Visit: Payer: Self-pay

## 2024-07-16 ENCOUNTER — Telehealth: Payer: Self-pay | Admitting: *Deleted

## 2024-07-16 ENCOUNTER — Other Ambulatory Visit: Payer: Self-pay | Admitting: Family Medicine

## 2024-07-16 MED ORDER — MONTELUKAST SODIUM 10 MG PO TABS: 10.0000 mg | ORAL_TABLET | Freq: Every day | ORAL | 3 refills | Status: AC

## 2024-07-16 MED ORDER — MELOXICAM 15 MG PO TABS: 15.0000 mg | ORAL_TABLET | Freq: Every day | ORAL | 0 refills | Status: AC

## 2024-07-16 MED ORDER — ALPRAZOLAM 0.5 MG PO TABS: 0.5000 mg | ORAL_TABLET | Freq: Two times a day (BID) | ORAL | 1 refills | Status: AC

## 2024-07-16 MED ORDER — MONTELUKAST SODIUM 10 MG PO TABS: 10.0000 mg | ORAL_TABLET | Freq: Every day | ORAL | 1 refills | Status: AC

## 2024-07-16 NOTE — Telephone Encounter (Signed)
 Patient informed to schedule prior to 10/12/2023. Patient reported she is experiencing a shingles outbreak and inquired if valtrex  can be called in for her?

## 2024-07-16 NOTE — Telephone Encounter (Signed)
 Copied from CRM #[id number]. Topic: Clinical - Medication Refill >> Jul 16, 2024  1:52 PM [name] wrote: Medication: ALPRAZolam  (XANAX ) 0.5 MG tablet [[id number]], meloxicam  (MOBIC ) 15 MG tablet [[id number]], montelukast  (SINGULAIR ) 10 MG tablet [[id number]]  Has the patient contacted their pharmacy? No (Agent: If no, request that the patient contact the pharmacy for the refill. If patient does not wish to contact the pharmacy document the reason why and proceed with request.) (Agent: If yes, when and what did the pharmacy advise?)  This is the patient's preferred pharmacy:  [facility] - [place], [place] - [address] [place] [address] Phone: [phone] Fax: [phone]    Is this the correct pharmacy for this prescription? Yes If no, delete pharmacy and type the correct one.   Has the prescription been filled recently? No  Is the patient out of the medication? No  Has the patient been seen for an appointment in the last year OR does the patient have an upcoming appointment? Yes  Can we respond through MyChart? Yes  Agent: Please be advised that Rx refills may take up to 3 business days. We ask that you follow-up with your pharmacy.

## 2024-07-16 NOTE — Telephone Encounter (Signed)
 FYI Only or Action Required?: Action required by provider: clinical question for provider, update on patient condition, and patient is asking for valtrex  prescription and asking for refills.  Patient was last seen in primary care on 04/19/2024 by Vivienne Delon HERO, PA-C.  Called Nurse Triage reporting Rash.  Symptoms began yesterday.  Interventions attempted: Rest, hydration, or home remedies.  Symptoms are: unchanged.  Triage Disposition: See Physician Within 24 Hours  Patient/caregiver understands and will follow disposition?: No, wishes to speak with PCP  Message from [name] sent at 07/16/2024  1:49 PM EST  Reason for Triage: Patient is calling because she believes she may have shingles, she has a rash on her right buttocks. She is requesting a valtrex .   Reason for Disposition  SEVERE itching (i.e., interferes with sleep, normal activities or school)  Answer Assessment - Initial Assessment Questions 1. APPEARANCE of RASH: What does the rash look like? (e.g., blisters, dry flaky skin, red spots, redness, sores)     Red spots 2. SIZE: How big are the spots? (e.g., tip of pen, eraser, coin; inches, centimeters)     Cluster is the size of a nickel 3. LOCATION: Where is the rash located?     Right butt check 4. COLOR: What color is the rash? (Note: It is difficult to assess rash color in people with darker-colored skin. When this situation occurs, simply ask the caller to describe what they see.)     red 5. ONSET: When did the rash begin?     Started last night 6. FEVER: Do you have a fever? If Yes, ask: What is your temperature, how was it measured, and when did it start?     no 7. ITCHING: Does the rash itch? If Yes, ask: How bad is the itch? (Scale 1-10; or mild, moderate, severe)     yes 8. CAUSE: What do you think is causing the rash?     shingles 9. MEDICINE FACTORS: Have you started any new medicines within the last 2 weeks? (e.g., antibiotics)       no 10. OTHER SYMPTOMS: Do you have any other symptoms? (e.g., dizziness, headache, sore throat, joint pain)       no  Protocols used: Rash or Redness - [facility]

## 2024-07-16 NOTE — Telephone Encounter (Signed)
 Copied from CRM #[id number]. Topic: Clinical - Lab/Test Results >> Jul 16, 2024  1:50 PM [name] wrote: Reason for CRM: Patient is calling to see when Dr.Burchette would like for her to come in for repeat labs.

## 2024-07-17 MED ORDER — VALACYCLOVIR HCL 1 G PO TABS: 1000.0000 mg | ORAL_TABLET | Freq: Three times a day (TID) | ORAL | 0 refills | Status: AC

## 2024-07-17 NOTE — Telephone Encounter (Signed)
 We have no openings today rx sent

## 2024-07-17 NOTE — Telephone Encounter (Signed)
 Noted please see additional encounter, I spoke with the patient and informed her of the message below
